# Patient Record
Sex: Male | Born: 1990 | Race: White | Hispanic: No | Marital: Single | Smoking: Never smoker
Health system: Southern US, Community
[De-identification: ages and names within clinical notes are randomized; demographics above are authoritative.]

## PROBLEM LIST (undated history)

## (undated) DIAGNOSIS — H9325 Central auditory processing disorder: Secondary | ICD-10-CM

## (undated) DIAGNOSIS — F909 Attention-deficit hyperactivity disorder, unspecified type: Secondary | ICD-10-CM

## (undated) DIAGNOSIS — R6250 Unspecified lack of expected normal physiological development in childhood: Secondary | ICD-10-CM

## (undated) DIAGNOSIS — G47419 Narcolepsy without cataplexy: Secondary | ICD-10-CM

## (undated) DIAGNOSIS — K0889 Other specified disorders of teeth and supporting structures: Secondary | ICD-10-CM

## (undated) DIAGNOSIS — T8859XA Other complications of anesthesia, initial encounter: Secondary | ICD-10-CM

## (undated) DIAGNOSIS — J45909 Unspecified asthma, uncomplicated: Secondary | ICD-10-CM

## (undated) DIAGNOSIS — J45990 Exercise induced bronchospasm: Secondary | ICD-10-CM

## (undated) DIAGNOSIS — T4145XA Adverse effect of unspecified anesthetic, initial encounter: Secondary | ICD-10-CM

## (undated) DIAGNOSIS — F988 Other specified behavioral and emotional disorders with onset usually occurring in childhood and adolescence: Secondary | ICD-10-CM

## (undated) DIAGNOSIS — F930 Separation anxiety disorder of childhood: Secondary | ICD-10-CM

## (undated) DIAGNOSIS — Q860 Fetal alcohol syndrome (dysmorphic): Secondary | ICD-10-CM

## (undated) HISTORY — DX: Fetal alcohol syndrome (dysmorphic): Q86.0

## (undated) HISTORY — DX: Exercise induced bronchospasm: J45.990

## (undated) HISTORY — DX: Attention-deficit hyperactivity disorder, unspecified type: F90.9

## (undated) HISTORY — PX: PYLOROMYOTOMY: SHX5274

---

## 2013-03-16 DIAGNOSIS — M545 Low back pain: Secondary | ICD-10-CM | POA: Diagnosis not present

## 2013-03-16 DIAGNOSIS — R635 Abnormal weight gain: Secondary | ICD-10-CM | POA: Diagnosis not present

## 2013-03-16 DIAGNOSIS — R5381 Other malaise: Secondary | ICD-10-CM | POA: Diagnosis not present

## 2013-03-16 DIAGNOSIS — R209 Unspecified disturbances of skin sensation: Secondary | ICD-10-CM | POA: Diagnosis not present

## 2013-03-16 DIAGNOSIS — M2140 Flat foot [pes planus] (acquired), unspecified foot: Secondary | ICD-10-CM | POA: Diagnosis not present

## 2013-03-16 DIAGNOSIS — R5383 Other fatigue: Secondary | ICD-10-CM | POA: Diagnosis not present

## 2013-03-27 DIAGNOSIS — S93609A Unspecified sprain of unspecified foot, initial encounter: Secondary | ICD-10-CM | POA: Diagnosis not present

## 2013-03-27 DIAGNOSIS — S93409A Sprain of unspecified ligament of unspecified ankle, initial encounter: Secondary | ICD-10-CM | POA: Diagnosis not present

## 2013-03-30 DIAGNOSIS — G44209 Tension-type headache, unspecified, not intractable: Secondary | ICD-10-CM | POA: Diagnosis not present

## 2013-04-18 DIAGNOSIS — M545 Low back pain: Secondary | ICD-10-CM | POA: Diagnosis not present

## 2013-10-20 DIAGNOSIS — R5381 Other malaise: Secondary | ICD-10-CM | POA: Diagnosis not present

## 2013-10-20 DIAGNOSIS — R5383 Other fatigue: Secondary | ICD-10-CM | POA: Diagnosis not present

## 2013-10-20 DIAGNOSIS — R634 Abnormal weight loss: Secondary | ICD-10-CM | POA: Diagnosis not present

## 2013-10-20 DIAGNOSIS — R030 Elevated blood-pressure reading, without diagnosis of hypertension: Secondary | ICD-10-CM | POA: Diagnosis not present

## 2013-11-21 ENCOUNTER — Other Ambulatory Visit: Payer: Self-pay | Admitting: Family Medicine

## 2013-11-21 DIAGNOSIS — IMO0001 Reserved for inherently not codable concepts without codable children: Secondary | ICD-10-CM

## 2013-11-21 DIAGNOSIS — R030 Elevated blood-pressure reading, without diagnosis of hypertension: Principal | ICD-10-CM

## 2013-12-01 ENCOUNTER — Ambulatory Visit
Admission: RE | Admit: 2013-12-01 | Discharge: 2013-12-01 | Disposition: A | Discharge status: Home / Self Care | Payer: Medicare Other | Source: Ambulatory Visit | Attending: Family Medicine | Admitting: Family Medicine

## 2013-12-01 DIAGNOSIS — IMO0001 Reserved for inherently not codable concepts without codable children: Secondary | ICD-10-CM

## 2013-12-01 DIAGNOSIS — N2889 Other specified disorders of kidney and ureter: Secondary | ICD-10-CM | POA: Diagnosis not present

## 2013-12-01 DIAGNOSIS — R030 Elevated blood-pressure reading, without diagnosis of hypertension: Principal | ICD-10-CM

## 2013-12-27 ENCOUNTER — Encounter: Payer: Self-pay | Admitting: *Deleted

## 2013-12-28 ENCOUNTER — Encounter: Payer: Self-pay | Admitting: *Deleted

## 2013-12-28 ENCOUNTER — Ambulatory Visit (INDEPENDENT_AMBULATORY_CARE_PROVIDER_SITE_OTHER): Payer: Medicare Other | Admitting: Cardiovascular Disease

## 2013-12-28 ENCOUNTER — Encounter: Payer: Self-pay | Admitting: Cardiovascular Disease

## 2013-12-28 VITALS — BP 100/60 | HR 66 | Ht 65.0 in | Wt 214.0 lb

## 2013-12-28 DIAGNOSIS — R5381 Other malaise: Secondary | ICD-10-CM

## 2013-12-28 DIAGNOSIS — R0989 Other specified symptoms and signs involving the circulatory and respiratory systems: Secondary | ICD-10-CM

## 2013-12-28 DIAGNOSIS — R5383 Other fatigue: Secondary | ICD-10-CM | POA: Diagnosis not present

## 2013-12-28 DIAGNOSIS — R030 Elevated blood-pressure reading, without diagnosis of hypertension: Secondary | ICD-10-CM | POA: Diagnosis not present

## 2013-12-28 NOTE — Progress Notes (Signed)
Patient ID: Paul [name], male   DOB: Mar 01, 1991, 23 y.o.   MRN: [id number]  23 yo referred by Dr Despina Hick although seems as though mother insisted on visit.  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  Spends most of his day attending to house chores and watching t.v.  He has no complaints.  Mother notes multiple somatic issues.  He's lazy.  He fatigues.  His face turns red.  His BP is high at home.  ? Of fetal alcohol syndrome.  She wants to make sure he does not have a hole in his heart.  Originally from [place].  Had mild asthma in [place] but not in [place]  No chest pain palpitations or synocpe Has weakness in legs.  Does use elliptical and bike at home 30 minutes /day without issue.      ROS: Denies fever, malais, weight loss, blurry vision, decreased visual acuity, cough, sputum, SOB, hemoptysis, pleuritic pain, palpitaitons, heartburn, abdominal pain, melena, lower extremity edema, claudication, or rash.  All other systems reviewed and negative   General: Affect slow and developmentally delayed  Healthy:  appears stated age 23: normal Acne  Neck supple with no adenopathy JVP normal no bruits no thyromegaly Lungs clear with no wheezing and good diaphragmatic motion Heart:  S1/S2 no murmur,rub, gallop or click PMI normal Abdomen: benighn, BS positve, no tenderness, no AAA no bruit.  No HSM or HJR Distal pulses intact with no bruits No edema Neuro non-focal Skin warm and dry No muscular weakness  Medications Current Outpatient Prescriptions  Medication Sig Dispense Refill  . albuterol (PROVENTIL HFA;VENTOLIN HFA) 108 (90 BASE) MCG/ACT inhaler Inhale into the lungs every 6 (six) hours as needed for wheezing or shortness of breath.      . cholecalciferol (VITAMIN D) 1000 UNITS tablet Take 2,000 Units by mouth daily.       No current facility-administered medications for this visit.    Allergies Review of patient's allergies indicates no known allergies.  Family  History: Family History  Problem Relation Age of Onset  . Adopted: Yes  . Family history unknown: Yes    Social History: History   Social History  . Marital Status: Single    Spouse Name: N/A    Number of Children: N/A  . Years of Education: N/A   Occupational History  . Not on file.   Social History Main Topics  . Smoking status: Never Smoker   . Smokeless tobacco: Not on file  . Alcohol Use: No  . Drug Use: No  . Sexual Activity: Not on file   Other Topics Concern  . Not on file   Social History Narrative  . No narrative on file    Electrocardiogram:  SR rate 66 RAD Normal   Assessment and Plan

## 2013-12-28 NOTE — Assessment & Plan Note (Signed)
Tried to reassure mother that the somatic issues she notes have nothing to do with heart.  Given developmental delay and fatigue will order echo to r/o congenital disease  Normal exam and ECG suggests no major issues

## 2013-12-28 NOTE — Patient Instructions (Signed)
Your physician recommends that you continue on your current medications as directed. Please refer to the Current Medication list given to you today.  Your physician has requested that you have an echocardiogram. Echocardiography is a painless test that uses sound waves to create images of your heart. It provides your doctor with information about the size and shape of your heart and how well your heart's chambers and valves are working. This procedure takes approximately one hour. There are no restrictions for this procedure.  Follow-up with Dr. Johnsie Cancel as needed

## 2014-01-10 ENCOUNTER — Ambulatory Visit (HOSPITAL_COMMUNITY): Payer: Medicare Other | Attending: Cardiovascular Disease | Admitting: Radiology

## 2014-01-10 DIAGNOSIS — R5381 Other malaise: Secondary | ICD-10-CM | POA: Insufficient documentation

## 2014-01-10 DIAGNOSIS — R42 Dizziness and giddiness: Secondary | ICD-10-CM | POA: Insufficient documentation

## 2014-01-10 DIAGNOSIS — R5383 Other fatigue: Secondary | ICD-10-CM | POA: Diagnosis not present

## 2014-01-10 DIAGNOSIS — R0602 Shortness of breath: Secondary | ICD-10-CM | POA: Insufficient documentation

## 2014-01-10 DIAGNOSIS — I10 Essential (primary) hypertension: Secondary | ICD-10-CM | POA: Diagnosis not present

## 2014-01-10 DIAGNOSIS — IMO0002 Reserved for concepts with insufficient information to code with codable children: Secondary | ICD-10-CM | POA: Insufficient documentation

## 2014-01-10 DIAGNOSIS — R0989 Other specified symptoms and signs involving the circulatory and respiratory systems: Secondary | ICD-10-CM

## 2014-01-10 NOTE — Progress Notes (Signed)
Echocardiogram performed.  

## 2014-07-29 IMAGING — US US RENAL
1 series · 14 of 25 positions shown · non-contrast
Comparison: None.

CLINICAL DATA: Hypertension.

EXAM:
RENAL/URINARY TRACT ULTRASOUND COMPLETE

[Series 1: us renal · 0.30mm/px · 14 of 27 slices shown]
[im 1/27]
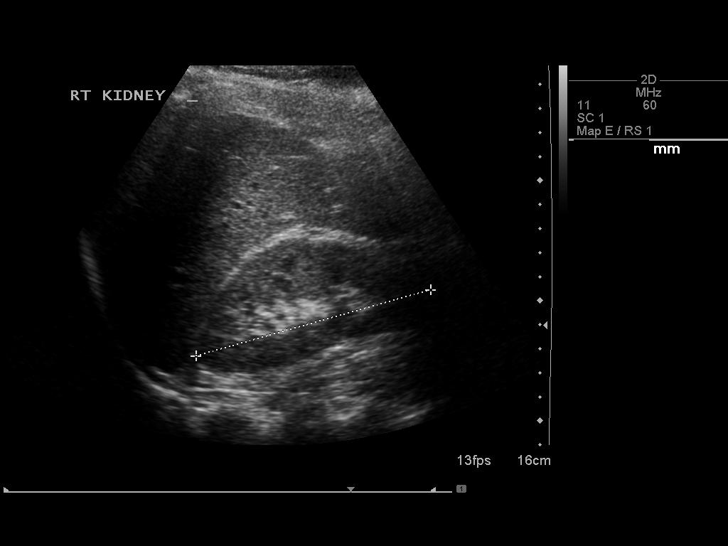
[im 3/27]
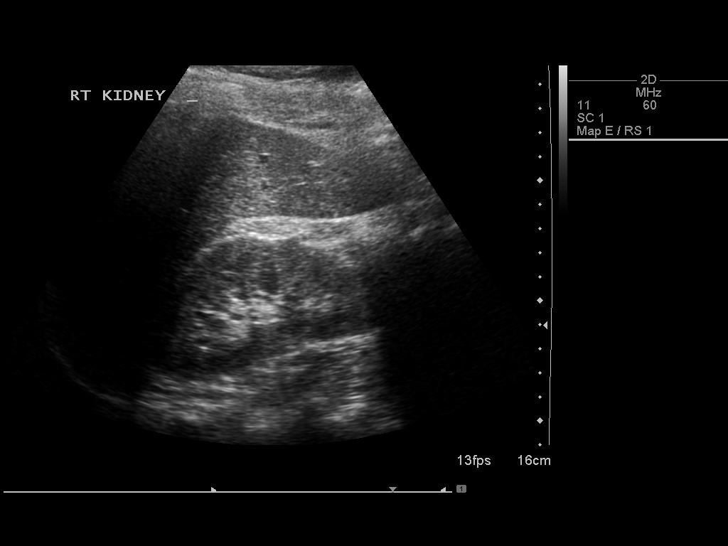
[im 5/27]
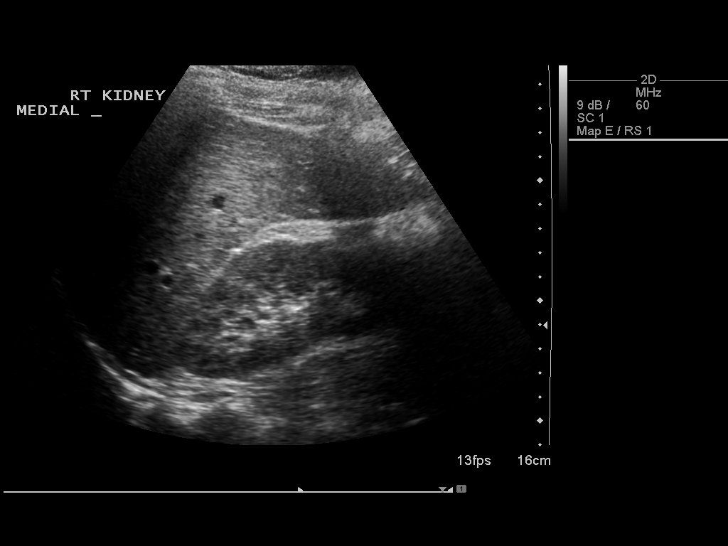
[im 7/27]
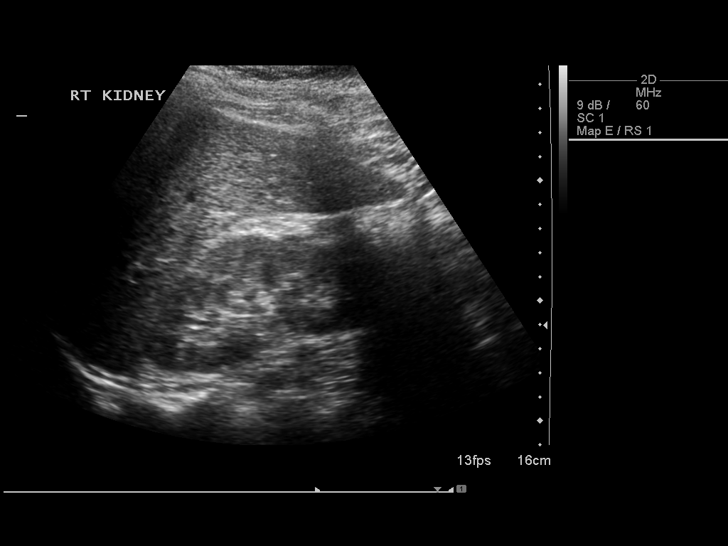
[im 9/27]
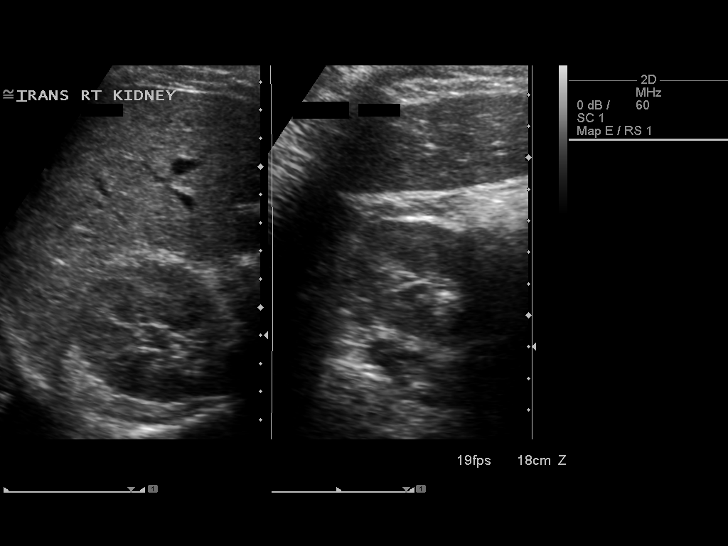
[im 10/27]
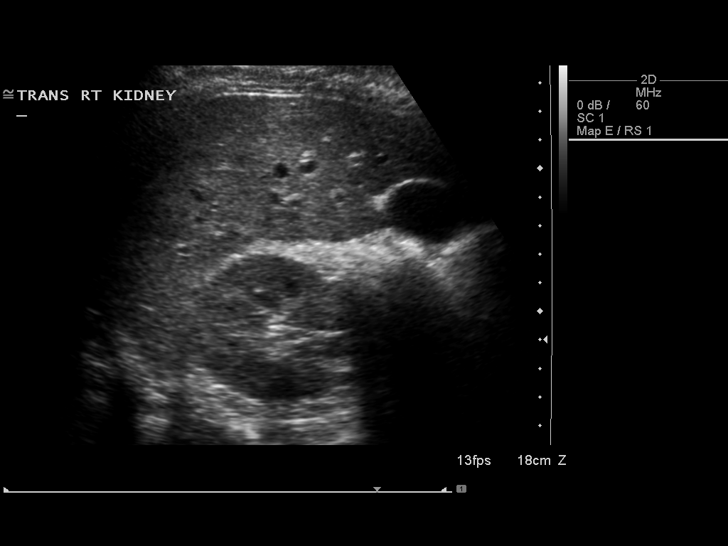
[im 12/27]
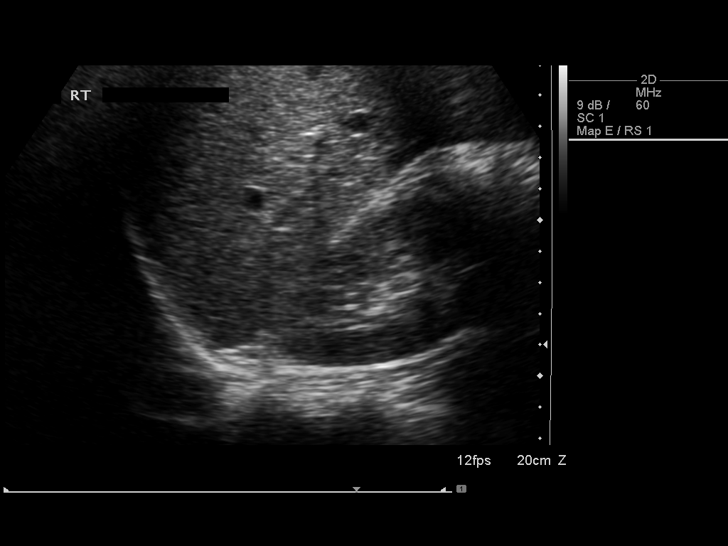
[im 15/27]
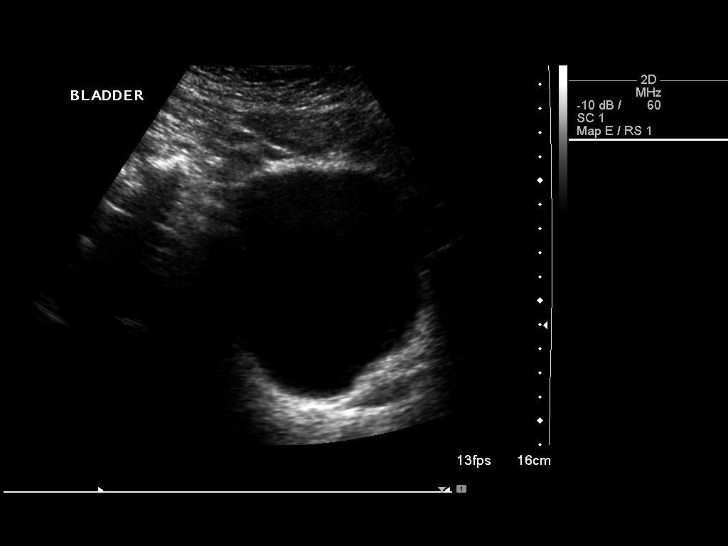
[im 17/27]
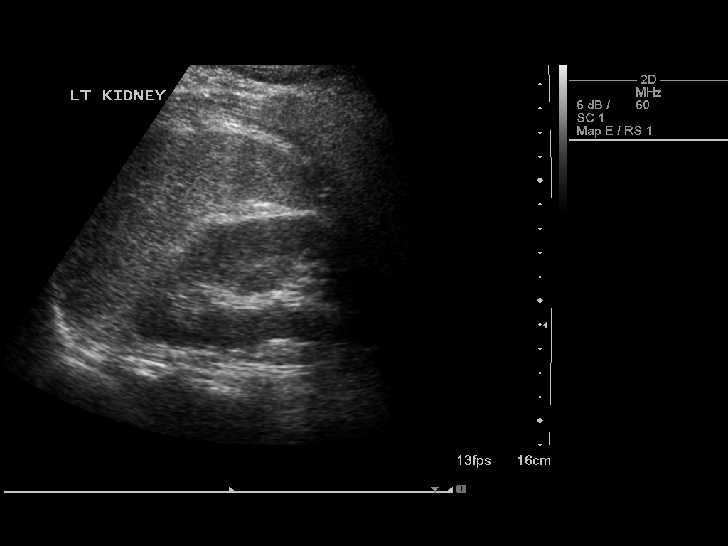
[im 18/27]
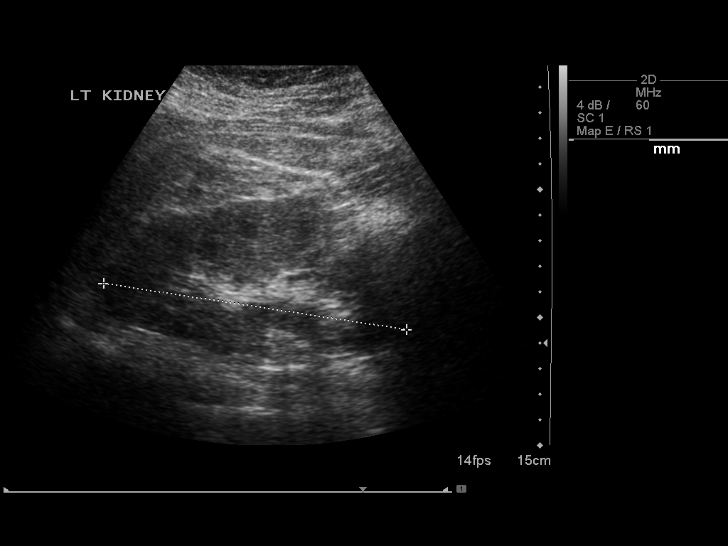
[im 20/27]
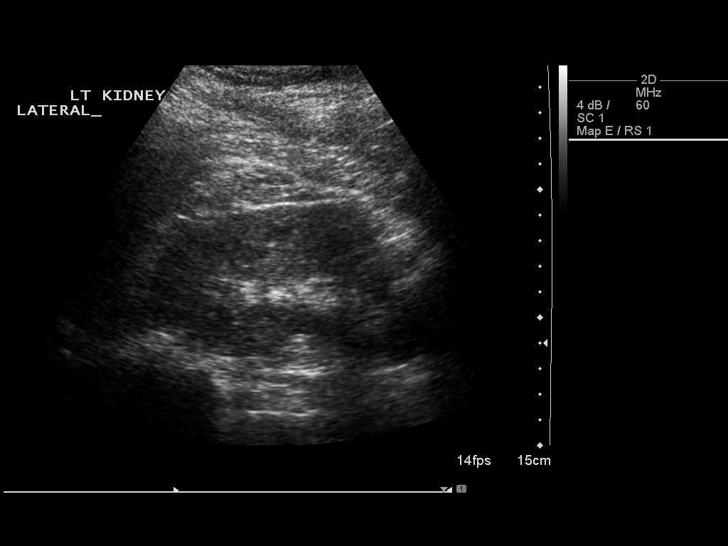
[im 22/27]
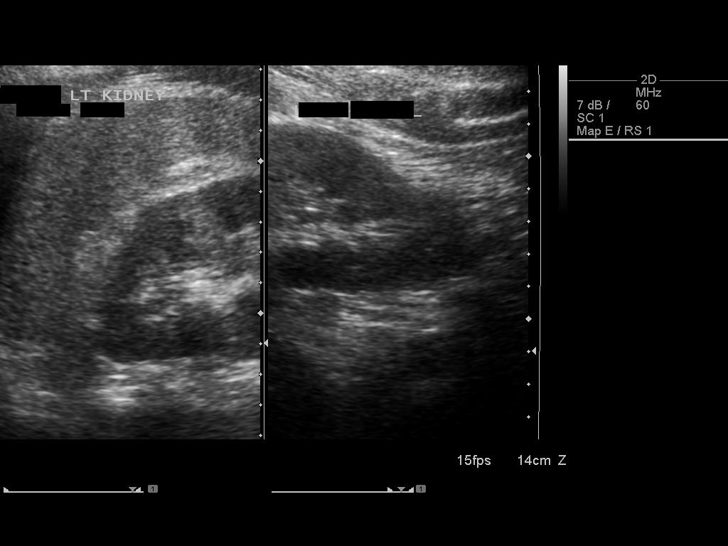
[im 24/27]
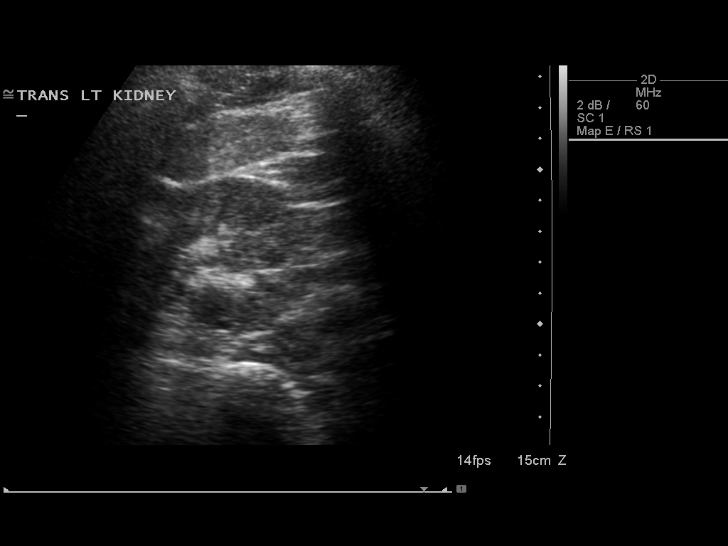
[im 27/27]
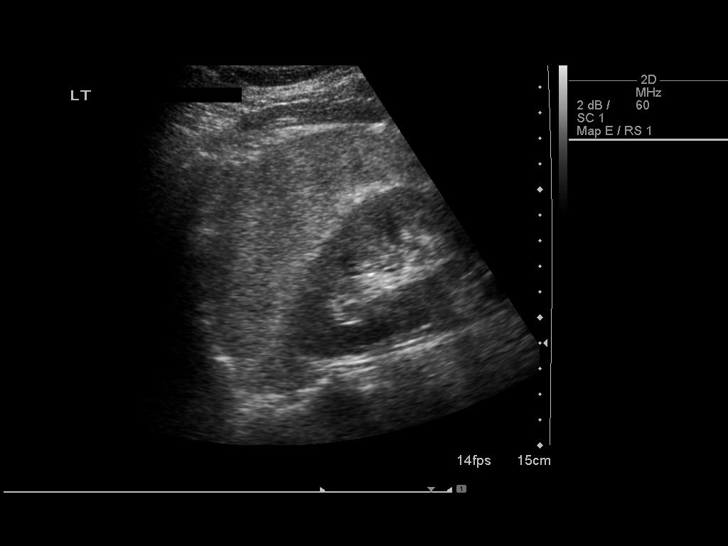

[14 of 25 positions shown; findings below may reference images not displayed]

FINDINGS: Right Kidney:

Length: 10.2 cm. Echogenicity within normal limits. No mass or
hydronephrosis visualized.

Left Kidney:

Length: 12.0 cm. Echogenicity within normal limits. No mass or
hydronephrosis visualized.

Bladder:

Appears normal for degree of bladder distention.
IMPRESSION: Normal urinary tract ultrasound.

## 2015-04-25 DIAGNOSIS — Z6835 Body mass index (BMI) 35.0-35.9, adult: Secondary | ICD-10-CM | POA: Diagnosis not present

## 2015-04-25 DIAGNOSIS — Z0000 Encounter for general adult medical examination without abnormal findings: Secondary | ICD-10-CM | POA: Diagnosis not present

## 2015-04-25 DIAGNOSIS — Z23 Encounter for immunization: Secondary | ICD-10-CM | POA: Diagnosis not present

## 2015-04-25 DIAGNOSIS — E669 Obesity, unspecified: Secondary | ICD-10-CM | POA: Diagnosis not present

## 2015-04-25 DIAGNOSIS — Z713 Dietary counseling and surveillance: Secondary | ICD-10-CM | POA: Diagnosis not present

## 2015-04-25 DIAGNOSIS — E559 Vitamin D deficiency, unspecified: Secondary | ICD-10-CM | POA: Diagnosis not present

## 2015-06-18 DIAGNOSIS — J9801 Acute bronchospasm: Secondary | ICD-10-CM | POA: Diagnosis not present

## 2015-06-18 DIAGNOSIS — Q860 Fetal alcohol syndrome (dysmorphic): Secondary | ICD-10-CM | POA: Diagnosis not present

## 2015-06-18 DIAGNOSIS — F941 Reactive attachment disorder of childhood: Secondary | ICD-10-CM | POA: Diagnosis not present

## 2015-06-18 DIAGNOSIS — J069 Acute upper respiratory infection, unspecified: Secondary | ICD-10-CM | POA: Diagnosis not present

## 2015-08-09 DIAGNOSIS — K047 Periapical abscess without sinus: Secondary | ICD-10-CM | POA: Diagnosis not present

## 2015-09-08 DIAGNOSIS — K0889 Other specified disorders of teeth and supporting structures: Secondary | ICD-10-CM

## 2015-09-08 HISTORY — DX: Other specified disorders of teeth and supporting structures: K08.89

## 2015-09-10 ENCOUNTER — Encounter (HOSPITAL_COMMUNITY): Payer: Self-pay | Admitting: Vascular Surgery

## 2015-09-10 ENCOUNTER — Encounter (HOSPITAL_COMMUNITY): Payer: Self-pay

## 2015-09-10 ENCOUNTER — Encounter (HOSPITAL_COMMUNITY)
Admission: RE | Admit: 2015-09-10 | Discharge: 2015-09-10 | Disposition: A | Discharge status: Home / Self Care | Payer: Medicare Other | Source: Ambulatory Visit | Attending: Oral Surgery | Admitting: Oral Surgery

## 2015-09-10 DIAGNOSIS — Z01818 Encounter for other preprocedural examination: Secondary | ICD-10-CM | POA: Insufficient documentation

## 2015-09-10 DIAGNOSIS — Z79899 Other long term (current) drug therapy: Secondary | ICD-10-CM | POA: Insufficient documentation

## 2015-09-10 DIAGNOSIS — K089 Disorder of teeth and supporting structures, unspecified: Secondary | ICD-10-CM | POA: Insufficient documentation

## 2015-09-10 DIAGNOSIS — J45909 Unspecified asthma, uncomplicated: Secondary | ICD-10-CM | POA: Diagnosis not present

## 2015-09-10 DIAGNOSIS — G47419 Narcolepsy without cataplexy: Secondary | ICD-10-CM | POA: Diagnosis not present

## 2015-09-10 DIAGNOSIS — Z01812 Encounter for preprocedural laboratory examination: Secondary | ICD-10-CM | POA: Diagnosis not present

## 2015-09-10 HISTORY — DX: Narcolepsy without cataplexy: G47.419

## 2015-09-10 HISTORY — DX: Unspecified asthma, uncomplicated: J45.909

## 2015-09-10 LAB — CBC
HCT: 41.8 % (ref 39.0–52.0)
HEMOGLOBIN: 14.9 g/dL (ref 13.0–17.0)
MCH: 31.8 pg (ref 26.0–34.0)
MCHC: 35.6 g/dL (ref 30.0–36.0)
MCV: 89.1 fL (ref 78.0–100.0)
PLATELETS: 263 10*3/uL (ref 150–400)
RBC: 4.69 MIL/uL (ref 4.22–5.81)
RDW: 12.3 % (ref 11.5–15.5)
WBC: 5.8 10*3/uL (ref 4.0–10.5)

## 2015-09-10 NOTE — Pre-Procedure Instructions (Addendum)
    Paul [name]  09/10/2015      [id number] - [name], [place] [address] Phone: [phone] Fax: [phone]   Your procedure is scheduled on Friday, September 13, 2015  Report to [name] Admitting at 5:30 A.M.  Call this number if you have problems the morning of surgery:  [phone]   Remember:  Do not eat food or drink liquids after midnight Thursday, September 12, 2015  Take these medicines the morning of surgery with A SIP OF WATER :None If needed: VENTOLIN inhaler ( Bring inhaler in with you on day of procedure).  Stop taking Aspirin, vitamins, fish oil, and herbal medications. Do not take any NSAIDs ie: Ibuprofen, Advil, Naproxen or any medication containing Aspirin ; stop now.   Do not wear jewelry, make-up or nail polish.  Do not wear lotions, powders, or perfumes.  You may wear deodorant.  Do not shave 48 hours prior to surgery.  Men may shave face and neck.  Do not bring valuables to the hospital.  [facility] is not responsible for any belongings or valuables.  Contacts, dentures or bridgework may not be worn into surgery.  Leave your suitcase in the car.  After surgery it may be brought to your room.  For patients admitted to the hospital, discharge time will be determined by your treatment team.  Patients discharged the day of surgery will not be allowed to drive home.   Name and phone number of your driver:   Special instructions: Shower the night before surgery and the morning of surgery with CHG.  Please read over the following fact sheets that you were given. Pain Booklet, Coughing and Deep Breathing and Surgical Site Infection Prevention

## 2015-09-10 NOTE — Progress Notes (Signed)
Pt did not show for scheduled 1:00P.M. PAT appointment; lvm on pt cell phone to return the call.

## 2015-09-10 NOTE — H&P (Signed)
HISTORY AND PHYSICAL  Paul [name] is a 25 y.o. male patient with CC: painful teeth  No diagnosis found.  No past medical history on file.  No current facility-administered medications for this encounter.   Current Outpatient Prescriptions  Medication Sig Dispense Refill  . clindamycin (CLEOCIN) 300 MG capsule Take 300 mg by mouth 4 (four) times daily.    . VENTOLIN HFA 108 (90 BASE) MCG/ACT inhaler Take 1 puff by mouth every 6 (six) hours as needed.     Allergies  Allergen Reactions  . Other     Fetal alcohol syndrome  Certain medications have adverse affect for him. Must have heavy anesthesia meds in order to work.   Active Problems:   * No active hospital problems. *  Vitals: There were no vitals taken for this visit. Lab results:No results found for this or any previous visit (from the past 34 hour(s)). Radiology Results: No results found. General appearance: alert, cooperative and no distress Head: Normocephalic, without obvious abnormality, atraumatic Eyes: negative Nose: Nares normal. Septum midline. Mucosa normal. No drainage or sinus tenderness. Throat: Dental careis teeth # 2, 3, 12, 13, 14, 18, 19, 20, 31; pharynx clear Neck: no adenopathy, supple, symmetrical, trachea midline and thyroid not enlarged, symmetric, no tenderness/mass/nodules Resp: clear to auscultation bilaterally Cardio: regular rate and rhythm, S1, S2 normal, no murmur, click, rub or gallop  Assessment:Multiple nonrestorable teeth   Plan:Multiple dental extractions. General anstheisa. Day surgery.   Wagner Tanzi M 09/10/2015

## 2015-09-11 NOTE — Progress Notes (Signed)
Anesthesia Chart Review: Patient is a 25 year old male scheduled for multiple teeth extractions on 09/13/15 by Dr. Hoyt Koch.  History includes narcolepsy, asthma (exercise induced and pollen related), non-smoker, pyloromyotomy. 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 No history of murmur reported (no murmur documented on Dr. Lupita Leash physical exam).   Meds include clindamycin (to be completed by surgery date), albuterol.  Preoperative CBC noted.   Further evaluation by his anesthesiologist on the day of surgery to discuss the definitive anesthesia plan.   George Hugh [facility] Short Stay Center/Anesthesiology Phone [phone] 09/11/2015 2:12 PM

## 2015-09-12 ENCOUNTER — Other Ambulatory Visit (HOSPITAL_COMMUNITY): Payer: Medicare Other

## 2015-09-12 MED ORDER — CEFAZOLIN SODIUM-DEXTROSE 2-3 GM-% IV SOLR
2.0000 g | INTRAVENOUS | Status: DC
Start: 2015-09-13 — End: 2015-09-12

## 2015-09-13 ENCOUNTER — Encounter (HOSPITAL_COMMUNITY): Admission: RE | Payer: Self-pay | Source: Ambulatory Visit

## 2015-09-13 ENCOUNTER — Ambulatory Visit (HOSPITAL_COMMUNITY): Admission: RE | Admit: 2015-09-13 | Payer: Medicare Other | Source: Ambulatory Visit | Admitting: Oral Surgery

## 2015-09-13 SURGERY — DENTAL RESTORATION/EXTRACTIONS
Anesthesia: General | Site: Mouth | Laterality: Bilateral

## 2015-09-19 ENCOUNTER — Encounter (HOSPITAL_BASED_OUTPATIENT_CLINIC_OR_DEPARTMENT_OTHER): Payer: Self-pay | Admitting: *Deleted

## 2015-09-19 NOTE — Progress Notes (Signed)
   09/19/15 0957  OBSTRUCTIVE SLEEP APNEA  Have you ever been diagnosed with sleep apnea through a sleep study? No  Do you snore loudly (loud enough to be heard through closed doors)?  1  Do you often feel tired, fatigued, or sleepy during the daytime (such as falling asleep during driving or talking to someone)? 1  Has anyone observed you stop breathing during your sleep? 1  Do you have, or are you being treated for high blood pressure? 0  BMI more than 35 kg/m2? 0  Age > 47 (1-yes) 0  Male Gender (Yes=1) 1  Obstructive Sleep Apnea Score 4  Score 5 or greater  Results sent to PCP Lester Kinsman)

## 2015-09-19 NOTE — Pre-Procedure Instructions (Signed)
Pt's size and history discussed with Dr. Tamala Winning; pt. OK to come for surgery.

## 2015-09-20 ENCOUNTER — Encounter (HOSPITAL_BASED_OUTPATIENT_CLINIC_OR_DEPARTMENT_OTHER): Payer: Self-pay | Admitting: *Deleted

## 2015-09-23 ENCOUNTER — Ambulatory Visit (HOSPITAL_BASED_OUTPATIENT_CLINIC_OR_DEPARTMENT_OTHER): Payer: Medicare Other | Admitting: Anesthesiology

## 2015-09-23 ENCOUNTER — Encounter (HOSPITAL_BASED_OUTPATIENT_CLINIC_OR_DEPARTMENT_OTHER)
Admission: RE | Disposition: A | Discharge status: Home / Self Care | Payer: Self-pay | Source: Ambulatory Visit | Attending: Oral Surgery

## 2015-09-23 ENCOUNTER — Encounter (HOSPITAL_BASED_OUTPATIENT_CLINIC_OR_DEPARTMENT_OTHER): Payer: Self-pay

## 2015-09-23 ENCOUNTER — Ambulatory Visit (HOSPITAL_BASED_OUTPATIENT_CLINIC_OR_DEPARTMENT_OTHER)
Admission: RE | Admit: 2015-09-23 | Discharge: 2015-09-23 | Disposition: A | Discharge status: Home / Self Care | Payer: Medicare Other | Source: Ambulatory Visit | Attending: Oral Surgery | Admitting: Oral Surgery

## 2015-09-23 DIAGNOSIS — K0889 Other specified disorders of teeth and supporting structures: Secondary | ICD-10-CM | POA: Diagnosis not present

## 2015-09-23 DIAGNOSIS — F909 Attention-deficit hyperactivity disorder, unspecified type: Secondary | ICD-10-CM | POA: Diagnosis not present

## 2015-09-23 DIAGNOSIS — K029 Dental caries, unspecified: Secondary | ICD-10-CM | POA: Diagnosis not present

## 2015-09-23 HISTORY — DX: Unspecified lack of expected normal physiological development in childhood: R62.50

## 2015-09-23 HISTORY — DX: Separation anxiety disorder of childhood: F93.0

## 2015-09-23 HISTORY — DX: Central auditory processing disorder: H93.25

## 2015-09-23 HISTORY — DX: Adverse effect of unspecified anesthetic, initial encounter: T41.45XA

## 2015-09-23 HISTORY — DX: Other complications of anesthesia, initial encounter: T88.59XA

## 2015-09-23 HISTORY — PX: MULTIPLE EXTRACTIONS WITH ALVEOLOPLASTY: SHX5342

## 2015-09-23 HISTORY — DX: Other specified disorders of teeth and supporting structures: K08.89

## 2015-09-23 SURGERY — MULTIPLE EXTRACTION WITH ALVEOLOPLASTY
Anesthesia: General | Site: Mouth

## 2015-09-23 MED ORDER — OXYCODONE-ACETAMINOPHEN 5-325 MG PO TABS: 1.0000 | ORAL_TABLET | ORAL | Status: DC | PRN

## 2015-09-23 MED ORDER — SCOPOLAMINE 1 MG/3DAYS TD PT72: 1.0000 | MEDICATED_PATCH | Freq: Once | TRANSDERMAL | Status: DC

## 2015-09-23 MED ORDER — PROPOFOL 500 MG/50ML IV EMUL
INTRAVENOUS | Status: AC
  Filled 2015-09-23: qty 50

## 2015-09-23 MED ORDER — SUCCINYLCHOLINE CHLORIDE 20 MG/ML IJ SOLN
INTRAMUSCULAR | Status: DC | PRN
  Administered 2015-09-23: 100 mg via INTRAVENOUS

## 2015-09-23 MED ORDER — GLYCOPYRROLATE 0.2 MG/ML IJ SOLN: 0.2000 mg | Freq: Once | INTRAMUSCULAR | Status: DC | PRN

## 2015-09-23 MED ORDER — LIDOCAINE HCL (CARDIAC) 20 MG/ML IV SOLN
INTRAVENOUS | Status: AC
Start: 2015-09-23 — End: 2015-09-23
  Filled 2015-09-23: qty 5

## 2015-09-23 MED ORDER — ONDANSETRON HCL 4 MG/2ML IJ SOLN
INTRAMUSCULAR | Status: DC | PRN
  Administered 2015-09-23: 4 mg via INTRAVENOUS

## 2015-09-23 MED ORDER — LIDOCAINE HCL (CARDIAC) 20 MG/ML IV SOLN
INTRAVENOUS | Status: DC | PRN
  Administered 2015-09-23: 100 mg via INTRAVENOUS

## 2015-09-23 MED ORDER — ONDANSETRON HCL 4 MG/2ML IJ SOLN
INTRAMUSCULAR | Status: AC
  Filled 2015-09-23: qty 2

## 2015-09-23 MED ORDER — DEXAMETHASONE SODIUM PHOSPHATE 4 MG/ML IJ SOLN
INTRAMUSCULAR | Status: DC | PRN
  Administered 2015-09-23: 10 mg via INTRAVENOUS

## 2015-09-23 MED ORDER — FENTANYL CITRATE (PF) 100 MCG/2ML IJ SOLN
INTRAMUSCULAR | Status: AC
  Filled 2015-09-23: qty 2

## 2015-09-23 MED ORDER — ATROPINE SULFATE 0.4 MG/ML IJ SOLN
INTRAMUSCULAR | Status: AC
  Filled 2015-09-23: qty 1

## 2015-09-23 MED ORDER — MIDAZOLAM HCL 2 MG/2ML IJ SOLN
0.5000 mg | INTRAMUSCULAR | Status: DC | PRN
  Administered 2015-09-23: 2 mg via INTRAVENOUS

## 2015-09-23 MED ORDER — DEXAMETHASONE SODIUM PHOSPHATE 10 MG/ML IJ SOLN
INTRAMUSCULAR | Status: AC
  Filled 2015-09-23: qty 1

## 2015-09-23 MED ORDER — SUCCINYLCHOLINE CHLORIDE 20 MG/ML IJ SOLN
INTRAMUSCULAR | Status: AC
  Filled 2015-09-23: qty 1

## 2015-09-23 MED ORDER — DEXMEDETOMIDINE HCL 200 MCG/2ML IV SOLN
INTRAVENOUS | Status: DC | PRN
  Administered 2015-09-23 (×3): 20 ug via INTRAVENOUS

## 2015-09-23 MED ORDER — LIDOCAINE-EPINEPHRINE 2 %-1:100000 IJ SOLN
INTRAMUSCULAR | Status: DC | PRN
  Administered 2015-09-23: 16 mL via INTRADERMAL

## 2015-09-23 MED ORDER — MIDAZOLAM HCL 2 MG/2ML IJ SOLN
INTRAMUSCULAR | Status: AC
  Filled 2015-09-23: qty 2

## 2015-09-23 MED ORDER — PROPOFOL 10 MG/ML IV BOLUS
INTRAVENOUS | Status: DC | PRN
  Administered 2015-09-23: 250 mg via INTRAVENOUS

## 2015-09-23 MED ORDER — FENTANYL CITRATE (PF) 100 MCG/2ML IJ SOLN
INTRAMUSCULAR | Status: DC | PRN
  Administered 2015-09-23: 100 ug via INTRAVENOUS

## 2015-09-23 MED ORDER — LACTATED RINGERS IV SOLN
INTRAVENOUS | Status: DC
  Administered 2015-09-23 (×2): via INTRAVENOUS

## 2015-09-23 SURGICAL SUPPLY — 48 items
BLADE DERMATOME SS (BLADE) IMPLANT
BLADE SURG 15 STRL LF DISP TIS (BLADE) ×1 IMPLANT
BLADE SURG 15 STRL SS (BLADE) ×2
BUR CROSS CUT FISSURE 1.6 (BURR) ×2 IMPLANT
BUR CROSS CUT FISSURE 1.6MM (BURR) ×1
BUR EGG ELITE 4.0 (BURR) ×2 IMPLANT
BUR EGG ELITE 4.0MM (BURR) ×1
CANISTER SUCT 1200ML W/VALVE (MISCELLANEOUS) ×3 IMPLANT
CATH ROBINSON RED A/P 10FR (CATHETERS) IMPLANT
CLOSURE WOUND 1/2 X4 (GAUZE/BANDAGES/DRESSINGS)
COVER BACK TABLE 60X90IN (DRAPES) ×3 IMPLANT
COVER MAYO STAND STRL (DRAPES) ×3 IMPLANT
DECANTER SPIKE VIAL GLASS SM (MISCELLANEOUS) IMPLANT
DEPRESSOR TONGUE BLADE STERILE (MISCELLANEOUS) IMPLANT
DRAPE U-SHAPE 76X120 STRL (DRAPES) ×3 IMPLANT
DRSG TEGADERM 4X10 (GAUZE/BANDAGES/DRESSINGS) IMPLANT
GAUZE PACKING FOLDED 2  STR (GAUZE/BANDAGES/DRESSINGS) ×2
GAUZE PACKING FOLDED 2 STR (GAUZE/BANDAGES/DRESSINGS) ×1 IMPLANT
GAUZE PACKING IODOFORM 1/4X15 (GAUZE/BANDAGES/DRESSINGS) IMPLANT
GLOVE BIO SURGEON STRL SZ 6.5 (GLOVE) ×2 IMPLANT
GLOVE BIO SURGEON STRL SZ7.5 (GLOVE) ×3 IMPLANT
GLOVE BIO SURGEONS STRL SZ 6.5 (GLOVE) ×1
GLOVE BIOGEL PI IND STRL 6.5 (GLOVE) ×2 IMPLANT
GLOVE BIOGEL PI INDICATOR 6.5 (GLOVE) ×4
GOWN STRL REUS W/ TWL LRG LVL3 (GOWN DISPOSABLE) ×1 IMPLANT
GOWN STRL REUS W/ TWL XL LVL3 (GOWN DISPOSABLE) ×1 IMPLANT
GOWN STRL REUS W/TWL LRG LVL3 (GOWN DISPOSABLE) ×2
GOWN STRL REUS W/TWL XL LVL3 (GOWN DISPOSABLE) ×2
IV NS 500ML (IV SOLUTION) ×2
IV NS 500ML BAXH (IV SOLUTION) ×1 IMPLANT
NEEDLE HYPO 22GX1.5 SAFETY (NEEDLE) ×6 IMPLANT
NS IRRIG 1000ML POUR BTL (IV SOLUTION) ×3 IMPLANT
PACK BASIN DAY SURGERY FS (CUSTOM PROCEDURE TRAY) ×3 IMPLANT
SLEEVE SCD COMPRESS KNEE MED (MISCELLANEOUS) IMPLANT
SPONGE SURGIFOAM ABS GEL 12-7 (HEMOSTASIS) IMPLANT
STRIP CLOSURE SKIN 1/2X4 (GAUZE/BANDAGES/DRESSINGS) IMPLANT
SUT CHROMIC 3 0 PS 2 (SUTURE) ×3 IMPLANT
SYR 20CC LL (SYRINGE) IMPLANT
SYR BULB 3OZ (MISCELLANEOUS) ×3 IMPLANT
SYR CONTROL 10ML LL (SYRINGE) ×6 IMPLANT
TOOTHBRUSH ADULT (PERSONAL CARE ITEMS) IMPLANT
TOWEL OR 17X24 6PK STRL BLUE (TOWEL DISPOSABLE) ×3 IMPLANT
TOWEL OR NON WOVEN STRL DISP B (DISPOSABLE) ×3 IMPLANT
TRAY DSU PREP LF (CUSTOM PROCEDURE TRAY) IMPLANT
TUBE CONNECTING 20'X1/4 (TUBING) ×1
TUBE CONNECTING 20X1/4 (TUBING) ×2 IMPLANT
TUBING IRRIGATION (MISCELLANEOUS) ×3 IMPLANT
YANKAUER SUCT BULB TIP NO VENT (SUCTIONS) ×3 IMPLANT

## 2015-09-23 NOTE — Anesthesia Preprocedure Evaluation (Signed)
Anesthesia Evaluation  Patient identified by MRN, date of birth, ID band Patient awake    Reviewed: Allergy & Precautions, NPO status , Patient's Chart, lab work & pertinent test results  Airway Mallampati: II   Neck ROM: full    Dental   Pulmonary asthma ,    breath sounds clear to auscultation       Cardiovascular negative cardio ROS   Rhythm:regular Rate:Normal     Neuro/Psych ADHD   GI/Hepatic   Endo/Other  obese  Renal/GU      Musculoskeletal   Abdominal   Peds Fetal alcohol syndrome   Hematology   Anesthesia Other Findings   Reproductive/Obstetrics                             Anesthesia Physical Anesthesia Plan  ASA: II  Anesthesia Plan: General   Post-op Pain Management:    Induction: Intravenous  Airway Management Planned: Nasal ETT  Additional Equipment:   Intra-op Plan:   Post-operative Plan: Extubation in OR  Informed Consent: I have reviewed the patients History and Physical, chart, labs and discussed the procedure including the risks, benefits and alternatives for the proposed anesthesia with the patient or authorized representative who has indicated his/her understanding and acceptance.     Plan Discussed with: CRNA, Anesthesiologist and Surgeon  Anesthesia Plan Comments:         Anesthesia Quick Evaluation

## 2015-09-23 NOTE — Anesthesia Procedure Notes (Signed)
Procedure Name: Intubation Date/Time: 09/23/2015 7:31 AM Performed by: Lyndee Leo Pre-anesthesia Checklist: Patient identified, Emergency Drugs available, Suction available and Patient being monitored Patient Re-evaluated:Patient Re-evaluated prior to inductionOxygen Delivery Method: Circle System Utilized Preoxygenation: Pre-oxygenation with 100% oxygen Intubation Type: IV induction Ventilation: Mask ventilation without difficulty Laryngoscope Size: Miller and 3 Grade View: Grade II Nasal Tubes: Nasal prep performed, Nasal Rae, Left and Magill forceps- large, utilized Tube size: 7.0 mm Number of attempts: 1 Placement Confirmation: ETT inserted through vocal cords under direct vision,  positive ETCO2 and breath sounds checked- equal and bilateral Tube secured with: Tape Dental Injury: Teeth and Oropharynx as per pre-operative assessment

## 2015-09-23 NOTE — Discharge Instructions (Signed)

## 2015-09-23 NOTE — Transfer of Care (Signed)
Immediate Anesthesia Transfer of Care Note  Patient: Paul [name]  Procedure(s) Performed: Procedure(s): MULTIPLE EXTRACTION WITH ALVEOLOPLASTY (N/A)  Patient Location: PACU  Anesthesia Type:General  Level of Consciousness: sedated and responds to stimulation  Airway & Oxygen Therapy: Patient Spontanous Breathing and Patient connected to face mask oxygen  Post-op Assessment: Report given to RN and Post -op Vital signs reviewed and stable  Post vital signs: Reviewed and stable  Last Vitals:  Filed Vitals:   09/23/15 0627 09/23/15 0821  BP: 138/66   Pulse: 61 77  Temp: 36.7 C   Resp: 20     Complications: No apparent anesthesia complications

## 2015-09-23 NOTE — Anesthesia Postprocedure Evaluation (Signed)
Anesthesia Post Note  Patient: Paul [name]  Procedure(s) Performed: Procedure(s) (LRB): MULTIPLE EXTRACTION WITH ALVEOLOPLASTY (N/A)  Patient location during evaluation: PACU Anesthesia Type: General Level of consciousness: awake and alert and patient cooperative Pain management: pain level controlled Vital Signs Assessment: post-procedure vital signs reviewed and stable Respiratory status: spontaneous breathing and respiratory function stable Cardiovascular status: stable Anesthetic complications: no    Last Vitals:  Filed Vitals:   09/23/15 0845 09/23/15 0853  BP: 101/56   Pulse: 77 76  Temp:    Resp: 14 14    Last Pain: There were no vitals filed for this visit.               Emlyn

## 2015-09-23 NOTE — Op Note (Signed)
NAMEMIRIAM, BIONDO               ACCOUNT NO.:  [id number]  MEDICAL RECORD NO.:  [id number]  LOCATION:                                 FACILITY:  PHYSICIAN:  Gae Bon, M.D.  DATE OF BIRTH:  [birth year]  DATE OF PROCEDURE:  09/23/2015 DATE OF DISCHARGE:                              OPERATIVE REPORT   PREOPERATIVE DIAGNOSIS:  Nonrestorable teeth numbers 2, 3, 12, 13, 15, 18, 19, 20, 31.  POSTOPERATIVE DIAGNOSIS:  Nonrestorable teeth numbers 2, 3, 12, 13, 15, 18, 19, 20, 31.  PROCEDURE:  Multiple extractions, teeth numbers 2, 3, 12, 13, 15, 18, 19, 20, 31.  Alveoplasty in the left mandible and right mandible.  SURGEON:  Gae Bon, M.D.  ANESTHESIA:  General, nasal intubation.  ESTIMATED BLOOD LOSS:  Minimal.  COMPLICATIONS:  None.  SPECIMENS:  None.  PROCEDURE IN DETAIL:  The patient was taken to the operating room, placed on the table in a supine position.  General anesthesia was administered intravenously, and a nasal endotracheal tube was placed and secured.  The eyes were protected, and then the patient was draped for the procedure.  Time-out was performed.  The posterior pharynx was suctioned, and a throat pack was placed.  A 2% lidocaine with 1:100,000 epinephrine was infiltrated in inferior alveolar block on the right and left side and a buccal and palatal infiltration in the right and left maxilla, total of 16 mL of solution was utilized.  A bite block was placed in the right side of the mouth, and a sweetheart retractor was used to retract the tongue.  A #15 blade was used to make an incision around teeth numbers 18, 19, 20 on the buccal and lingual aspects in the gingival sulcus and around teeth numbers 12, 13, and 15.  The periosteum was reflected from around these teeth.  The Stryker handpiece was used to remove interproximal bone from around these teeth and then the teeth were elevated, and removal was attempted with the forceps.  All of  these teeth fractured requiring additional bone removal and sectioning.  The teeth were eventually removed using the 301 elevator with hemostats. The sockets were then curetted.  Alveoplasty was performed in the left mandible using the egg-shaped burr and bone file; and then, the left side was sutured with 3-0 chromic.  The bite block and sweetheart retractor were repositioned to the other side of the mouth; and then, a 15 blade was used to make an incision around teeth numbers 2, 3, and 31. Periosteum was reflected, and the teeth were elevated.  An attempt was made to remove the teeth with the dental forceps.  However, these teeth fractured due to extensive amalgam failing amalgam restorations; and then, the teeth were sectioned with a Stryker handpiece under irrigation and removed.  Tooth number 31 required extension of an initial incision because of the need for access to complete bony margins around this tooth.  When the teeth were eventually extracted, the sockets were curetted, alveoplasty was performed in the right mandible as the remaining bone was irregular in contour and would not allow for prosthetic restoration.  The areas were then irrigated and closed with  3-0 chromic. Then, the oral cavity was irrigated and suctioned.  The throat pack was removed.  The patient was awakened, taken to recovery room, breathing spontaneously in good condition.     Gae Bon, M.D.     SMJ/MEDQ  D:  09/23/2015  T:  09/23/2015  Job:  [id number]

## 2015-09-23 NOTE — H&P (Signed)
HISTORY AND PHYSICAL  Paul [name] is a 25 y.o. male patient with CC: referred by general dentist for extraction nonrestorable teeth.  No diagnosis found.  Past Medical History  Diagnosis Date  . Narcolepsy   . Asthma     excercise induced and pollen  . Fetal alcohol syndrome   . Development delay     states mental status of a 25 year old  . Exercise-induced asthma     prn inhaler/neb.  . Twin birth   . ADHD (attention deficit hyperactivity disorder)   . Auditory processing disorder   . Separation anxiety   . Non-restorable tooth 09/2015    teeth  . Complication of anesthesia     mother states pt. is harder to put under anes. and hard to wake up due to fetal alcohol syndrome; can be combative, per mother; states he will do better if mother is in PACU when he is coming out of anes.    Current Facility-Administered Medications  Medication Dose Route Frequency Provider Last Rate Last Dose  . glycopyrrolate (ROBINUL) injection 0.2 mg  0.2 mg Intravenous Once PRN Roderic Palau, MD      . lactated ringers infusion   Intravenous Continuous Roderic Palau, MD 10 mL/hr at 09/23/15 0710    . midazolam (VERSED) injection 0.5-2 mg  0.5-2 mg Intravenous Q5 min PRN Roderic Palau, MD      . scopolamine (TRANSDERM-SCOP) 1 MG/3DAYS 1.5 mg  1 patch Transdermal Once Roderic Palau, MD       Allergies  Allergen Reactions  . Other     Fetal alcohol syndrome  Certain medications have adverse affect for him. Must have heavy anesthesia meds in order to work.   Active Problems:   * No active hospital problems. *  Vitals: Blood pressure 138/66, pulse 61, temperature 98.1 F (36.7 C), temperature source Oral, resp. rate 20, height 5\' 10"  (1.778 m), weight 103.239 kg (227 lb 9.6 oz), SpO2 99 %. Lab results:No results found for this or any previous visit (from the past 77 hour(s)). Radiology Results: No results found. General appearance: alert and cooperative Head:  Normocephalic, without obvious abnormality, atraumatic Eyes: negative Nose: Nares normal. Septum midline. Mucosa normal. No drainage or sinus tenderness. Throat: multiple dental caries, pharynx clear, no trismus Neck: no adenopathy, no carotid bruit, no JVD, supple, symmetrical, trachea midline and thyroid not enlarged, symmetric, no tenderness/mass/nodules Resp: clear to auscultation bilaterally Cardio: regular rate and rhythm, S1, S2 normal, no murmur, click, rub or gallop  Assessment: Multiple nonrestorable teeth secondary to dental caries  Plan: Multiple dental extractions. General anesthesia. Day surgery.   Gae Bon 09/23/2015

## 2015-09-23 NOTE — Op Note (Signed)
09/23/2015  8:07 AM  PATIENT:  Paul [name]  25 y.o. male  PRE-OPERATIVE DIAGNOSIS:  NON RESTORABLE TEETH #2, 3, 12, 13, 15, 18, 19, 20, 31  POST-OPERATIVE DIAGNOSIS:  SAME  PROCEDURE:  Procedure(s): MULTIPLE EXTRACTION TEETH#2, 3, 12, 13, 15, 18, 19, 20, 31 WITH ALVEOLOPLASTY  SURGEON:  Surgeon(s): Diona Browner, DDS  ANESTHESIA:   local and general  EBL:  minimal  DRAINS: none   SPECIMEN:  No Specimen  COUNTS:  YES  PLAN OF CARE: Discharge to home after PACU  PATIENT DISPOSITION:  PACU - hemodynamically stable.   PROCEDURE DETAILS: Dictation # [id number]  Gae Bon, DMD 09/23/2015 8:07 AM

## 2015-09-24 ENCOUNTER — Encounter (HOSPITAL_BASED_OUTPATIENT_CLINIC_OR_DEPARTMENT_OTHER): Payer: Self-pay | Admitting: Oral Surgery

## 2015-11-06 ENCOUNTER — Ambulatory Visit (HOSPITAL_BASED_OUTPATIENT_CLINIC_OR_DEPARTMENT_OTHER): Payer: Medicare Other

## 2015-11-16 ENCOUNTER — Ambulatory Visit (HOSPITAL_BASED_OUTPATIENT_CLINIC_OR_DEPARTMENT_OTHER): Payer: Medicare Other | Attending: Family Medicine

## 2015-11-27 ENCOUNTER — Other Ambulatory Visit: Payer: Self-pay | Admitting: Physician Assistant

## 2015-11-27 ENCOUNTER — Ambulatory Visit
Admission: RE | Admit: 2015-11-27 | Discharge: 2015-11-27 | Disposition: A | Discharge status: Home / Self Care | Payer: Medicare Other | Source: Ambulatory Visit | Attending: Physician Assistant | Admitting: Physician Assistant

## 2015-11-27 DIAGNOSIS — M79671 Pain in right foot: Secondary | ICD-10-CM | POA: Diagnosis not present

## 2015-11-27 DIAGNOSIS — S92344A Nondisplaced fracture of fourth metatarsal bone, right foot, initial encounter for closed fracture: Secondary | ICD-10-CM | POA: Diagnosis not present

## 2015-12-26 DIAGNOSIS — M79671 Pain in right foot: Secondary | ICD-10-CM | POA: Diagnosis not present

## 2016-01-21 ENCOUNTER — Ambulatory Visit (HOSPITAL_BASED_OUTPATIENT_CLINIC_OR_DEPARTMENT_OTHER): Payer: Medicare Other | Attending: Family Medicine | Admitting: Internal Medicine

## 2016-01-21 VITALS — Ht 65.0 in | Wt 220.0 lb

## 2016-01-21 DIAGNOSIS — R0683 Snoring: Secondary | ICD-10-CM | POA: Diagnosis not present

## 2016-01-21 DIAGNOSIS — G4730 Sleep apnea, unspecified: Secondary | ICD-10-CM

## 2016-01-21 DIAGNOSIS — R5383 Other fatigue: Secondary | ICD-10-CM

## 2016-01-26 DIAGNOSIS — G4730 Sleep apnea, unspecified: Secondary | ICD-10-CM

## 2016-01-26 DIAGNOSIS — R5383 Other fatigue: Secondary | ICD-10-CM

## 2016-01-26 DIAGNOSIS — R0683 Snoring: Secondary | ICD-10-CM | POA: Diagnosis not present

## 2016-01-26 NOTE — Procedures (Signed)
  Patient Name: Paul [name], Paul [name] Date: 01/21/2016 Gender: Male D.O.B: 1991-05-27 Age (years): 24 Referring Provider: Maury Dus Height (inches): 48 Interpreting Physician: Baird Lyons MD, ABSM Weight (lbs): 220 RPSGT: Laren Everts BMI: 37 MRN: [id number] Neck Size: 17.50 CLINICAL INFORMATION Sleep Study Type: NPSG Indication for sleep study: Excessive Daytime Sleepiness, Fatigue, Obesity, Re-Evaluation, Snoring Epworth Sleepiness Score: 17  SLEEP STUDY TECHNIQUE As per the AASM Manual for the Scoring of Sleep and Associated Events v2.3 (April 2016) with a hypopnea requiring 4% desaturations. The channels recorded and monitored were frontal, central and occipital EEG, electrooculogram (EOG), submentalis EMG (chin), nasal and oral airflow, thoracic and abdominal wall motion, anterior tibialis EMG, snore microphone, electrocardiogram, and pulse oximetry.  MEDICATIONS Patient's medications include: charted for review. Medications self-administered by patient during sleep study : No sleep medicine administered.  SLEEP ARCHITECTURE The study was initiated at 10:00:45 PM and ended at 4:58:36 AM. Sleep onset time was 0.2 minutes and the sleep efficiency was 96.1%. The total sleep time was 401.5 minutes. Stage REM latency was 58.0 minutes. The patient spent 3.61% of the night in stage N1 sleep, 65.75% in stage N2 sleep, 8.34% in stage N3 and 22.29% in REM. Alpha intrusion was absent. Supine sleep was 77.23%.  RESPIRATORY PARAMETERS The overall apnea/hypopnea index (AHI) was 2.7 per hour. There were 2 total apneas, including 0 obstructive, 2 central and 0 mixed apneas. There were 16 hypopneas and 16 RERAs. The AHI during Stage REM sleep was 9.4 per hour. AHI while supine was 3.3 per hour. The mean oxygen saturation was 94.63%. The minimum SpO2 during sleep was 88.00%. Moderate snoring was noted during this study.  CARDIAC DATA The 2 lead EKG demonstrated sinus rhythm.  The mean heart rate was N/A beats per minute. Other EKG findings include: PVCs.  LEG MOVEMENT DATA The total PLMS were 26 with a resulting PLMS index of 3.89. Associated arousal with leg movement index was 1.3 .  IMPRESSIONS - No significant obstructive sleep apnea occurred during this study (AHI = 2.7/h). - No significant central sleep apnea occurred during this study (CAI = 0.3/h). - The patient snored with Moderate snoring volume. - EKG findings include PVCs. - Clinically significant periodic limb movements did not occur during sleep. No significant associated arousals.  DIAGNOSIS - Primary Snoring (786.09 [R06.83 ICD-10]) - Normal study  RECOMMENDATIONS - Avoid alcohol, sedatives and other CNS depressants that may worsen sleep apnea and disrupt normal sleep architecture. - Sleep hygiene should be reviewed to assess factors that may improve sleep quality. - Weight management and regular exercise should be initiated or continued if appropriate.  Deneise Lever Diplomate, American Board of Sleep Medicine  ELECTRONICALLY SIGNED ON:  01/26/2016, 1:25 PM [place] PH: [phone]   FX: [phone] [place]

## 2018-01-03 DIAGNOSIS — Z0000 Encounter for general adult medical examination without abnormal findings: Secondary | ICD-10-CM | POA: Diagnosis not present

## 2018-01-03 DIAGNOSIS — E6609 Other obesity due to excess calories: Secondary | ICD-10-CM | POA: Diagnosis not present

## 2018-01-03 DIAGNOSIS — E559 Vitamin D deficiency, unspecified: Secondary | ICD-10-CM | POA: Diagnosis not present

## 2018-01-03 DIAGNOSIS — Z6837 Body mass index (BMI) 37.0-37.9, adult: Secondary | ICD-10-CM | POA: Diagnosis not present

## 2019-06-14 DIAGNOSIS — J4521 Mild intermittent asthma with (acute) exacerbation: Secondary | ICD-10-CM | POA: Diagnosis not present

## 2019-11-29 DIAGNOSIS — E559 Vitamin D deficiency, unspecified: Secondary | ICD-10-CM | POA: Diagnosis not present

## 2019-11-29 DIAGNOSIS — L309 Dermatitis, unspecified: Secondary | ICD-10-CM | POA: Diagnosis not present

## 2019-11-29 DIAGNOSIS — R413 Other amnesia: Secondary | ICD-10-CM | POA: Diagnosis not present

## 2019-12-26 DIAGNOSIS — R5383 Other fatigue: Secondary | ICD-10-CM | POA: Diagnosis not present

## 2019-12-26 DIAGNOSIS — R413 Other amnesia: Secondary | ICD-10-CM | POA: Diagnosis not present

## 2019-12-26 DIAGNOSIS — Z6839 Body mass index (BMI) 39.0-39.9, adult: Secondary | ICD-10-CM | POA: Diagnosis not present

## 2019-12-26 DIAGNOSIS — Q860 Fetal alcohol syndrome (dysmorphic): Secondary | ICD-10-CM | POA: Diagnosis not present

## 2019-12-26 DIAGNOSIS — E669 Obesity, unspecified: Secondary | ICD-10-CM | POA: Diagnosis not present

## 2019-12-26 DIAGNOSIS — E559 Vitamin D deficiency, unspecified: Secondary | ICD-10-CM | POA: Diagnosis not present

## 2019-12-26 DIAGNOSIS — J3089 Other allergic rhinitis: Secondary | ICD-10-CM | POA: Diagnosis not present

## 2019-12-26 DIAGNOSIS — L309 Dermatitis, unspecified: Secondary | ICD-10-CM | POA: Diagnosis not present

## 2019-12-26 DIAGNOSIS — J45990 Exercise induced bronchospasm: Secondary | ICD-10-CM | POA: Diagnosis not present

## 2019-12-26 DIAGNOSIS — R0683 Snoring: Secondary | ICD-10-CM | POA: Diagnosis not present

## 2020-01-01 DIAGNOSIS — R748 Abnormal levels of other serum enzymes: Secondary | ICD-10-CM | POA: Diagnosis not present

## 2020-01-01 DIAGNOSIS — R413 Other amnesia: Secondary | ICD-10-CM | POA: Diagnosis not present

## 2020-01-01 DIAGNOSIS — E559 Vitamin D deficiency, unspecified: Secondary | ICD-10-CM | POA: Diagnosis not present

## 2020-01-01 DIAGNOSIS — R5383 Other fatigue: Secondary | ICD-10-CM | POA: Diagnosis not present

## 2020-01-01 DIAGNOSIS — Z6839 Body mass index (BMI) 39.0-39.9, adult: Secondary | ICD-10-CM | POA: Diagnosis not present

## 2020-01-01 DIAGNOSIS — E669 Obesity, unspecified: Secondary | ICD-10-CM | POA: Diagnosis not present

## 2020-01-03 ENCOUNTER — Other Ambulatory Visit: Payer: Self-pay

## 2020-01-03 ENCOUNTER — Telehealth: Payer: Self-pay | Admitting: Neurology

## 2020-01-03 ENCOUNTER — Encounter: Payer: Self-pay | Admitting: Neurology

## 2020-01-03 ENCOUNTER — Ambulatory Visit (INDEPENDENT_AMBULATORY_CARE_PROVIDER_SITE_OTHER): Payer: Medicare Other | Admitting: Neurology

## 2020-01-03 VITALS — BP 122/80 | HR 93 | Temp 97.3°F | Ht 68.0 in | Wt 241.0 lb

## 2020-01-03 DIAGNOSIS — Q860 Fetal alcohol syndrome (dysmorphic): Secondary | ICD-10-CM

## 2020-01-03 DIAGNOSIS — R4189 Other symptoms and signs involving cognitive functions and awareness: Secondary | ICD-10-CM | POA: Diagnosis not present

## 2020-01-03 DIAGNOSIS — R6889 Other general symptoms and signs: Secondary | ICD-10-CM | POA: Diagnosis not present

## 2020-01-03 DIAGNOSIS — E669 Obesity, unspecified: Secondary | ICD-10-CM | POA: Diagnosis not present

## 2020-01-03 DIAGNOSIS — G4719 Other hypersomnia: Secondary | ICD-10-CM | POA: Diagnosis not present

## 2020-01-03 DIAGNOSIS — R413 Other amnesia: Secondary | ICD-10-CM

## 2020-01-03 DIAGNOSIS — R0683 Snoring: Secondary | ICD-10-CM

## 2020-01-03 DIAGNOSIS — R400 Somnolence: Secondary | ICD-10-CM

## 2020-01-03 NOTE — H&P (View-Only) (Signed)
Subjective:    [name] ID: Paul [name] is a 29 y.o. male.  HPI     Paul Age, MD, PhD [facility] [address], [address] [place], [place] [address]  Dear Dr. Rayann [name],   I saw your [name], Paul [name], upon your kind request in my neurologic clinic today for initial consultation of his memory loss and sleepiness.  Paul [name] is accompanied by his mother today.   As you know, Paul [name] is a 29 year old right-handed gentleman with an underlying medical history of allergies, fetal alcohol syndrome, with developmental delays, ADHD, vitamin D deficiency, asthma, and obesity, who has had cognitive decline over Paul past 18 months per mom.  Paul [name] is minimally verbal and not able to provide any of his history.  She adopted him when he was 7.  He has a twin brother who had an abnormality on his brain.  Both Paul [name] and his twin brother had MRIs many years ago, MRI results of any brain MRI or CT had are not available for my review today.  He has had increase in frustration, some mood irritability, no aggression.  He has been noted to become slower in his movements and less vigilant in Paul past 1 year.  Sometimes he has a staring spell or zones out, no convulsions.  She is not sure if he had an EEG in Paul past but would like to get an EEG done.  She believes that he would not be able to lay still for an MRI and would like to have it done under anesthesia but also reports that he had complications from anesthesia including apneas when he had dental surgery under anesthesia and was sent home, and stopped breathing at home.  She did not have to go to Paul ER with him as he started breathing on his own. He has had sleepiness during Paul day and his Epworth sleepiness score is high at 22 out of 24, fatigue severity score is 63 out of 63. He snores. I reviewed your office note from 12/26/2019.  He had a sleep study about 4 years ago which was negative for sleep apnea  at Paul time.  I reviewed blood test results which were available through your records: TSH was normal, free T4 normal, B12 385, lipid panel showed LDL of 81, total cholesterol 122, triglycerides 78, HDL 30, vitamin D was low at 12, CMP showed BUN of 11, creatinine of 1.8, alkaline phosphatase elevated at 153, liver enzymes otherwise normal, CBC with differential was unremarkable. He is able to do his own ADLs but takes longer.  It takes him 4 hours to do Paul dishes.  He plays computer games but seems to have become less quick with games that he has been able to do well before.  His Past Medical History Is Significant For: Past Medical History:  Diagnosis Date  . ADHD (attention deficit hyperactivity disorder)   . Asthma    excercise induced and pollen  . Auditory processing disorder   . Complication of anesthesia    mother states pt. is harder to put under anes. and hard to wake up due to fetal alcohol syndrome; can be combative, per mother; states he will do better if mother is in PACU when he is coming out of anes.  . Development delay    states mental status of a 29 year old  . Exercise-induced asthma    prn inhaler/neb.  . Fetal alcohol syndrome   . Narcolepsy   .  Non-restorable tooth 09/2015   teeth  . Separation anxiety   . Twin birth     His Past Surgical History Is Significant For: Past Surgical History:  Procedure Laterality Date  . MULTIPLE EXTRACTIONS WITH ALVEOLOPLASTY N/A 09/23/2015   Procedure: MULTIPLE EXTRACTION WITH ALVEOLOPLASTY;  Surgeon: Paul [name], DDS;  Location: [facility];  Service: Oral Surgery;  Laterality: N/A;  . PYLOROMYOTOMY     [name] three, performed found to not have stenosis on surgical exam  . PYLOROMYOTOMY     did not have pyloric stenosis; did surgery on Paul wrong twin    His Family History Is Significant For: Family History  Adopted: Yes  Problem Relation [name] of Onset  . Mental illness Mother   . Diabetes Mother   .  Hypertension Mother   . Schizophrenia Mother   . Bipolar disorder Mother   . Mental retardation Father     His Social History Is Significant For: Social History   Socioeconomic History  . Marital status: Single    Spouse name: Not on file  . Number of children: Not on file  . Years of education: Not on file  . Highest education level: Not on file  Occupational History  . Not on file  Tobacco Use  . Smoking status: Never Smoker  . Smokeless tobacco: Never Used  Substance and Sexual Activity  . Alcohol use: No  . Drug use: No  . Sexual activity: Not on file  Other Topics Concern  . Not on file  Social History Narrative   ** Merged History Encounter **       Social Determinants of Health   Financial Resource Strain:   . Difficulty of Paying Living Expenses:   Food Insecurity:   . Worried About Charity fundraiser in Paul Last Year:   . Arboriculturist in [place] Last Year:   Transportation Needs:   . Film/video editor (Medical):   Marland Kitchen Lack of Transportation (Non-Medical):   Physical Activity:   . Days of Exercise per Week:   . Minutes of Exercise per Session:   Stress:   . Feeling of Stress :   Social Connections:   . Frequency of Communication with Friends and Family:   . Frequency of Social Gatherings with Friends and Family:   . Attends Religious Services:   . Active Member of Clubs or Organizations:   . Attends Archivist Meetings:   Marland Kitchen Marital Status:     His Allergies Are:  Allergies  Allergen Reactions  . Other     Fetal alcohol syndrome  Certain medications have adverse affect for him. Must have heavy anesthesia meds in order to work.  :   His Current Medications Are:  Outpatient Encounter Medications as of 01/03/2020  Medication Sig  . albuterol (PROVENTIL HFA;VENTOLIN HFA) 108 (90 BASE) MCG/ACT inhaler Inhale into Paul lungs every 6 (six) hours as needed for wheezing or shortness of breath.  Marland Kitchen albuterol (PROVENTIL) (5 MG/ML) 0.5%  nebulizer solution Take 2.5 mg by nebulization every 6 (six) hours as needed for wheezing or shortness of breath.  . loratadine (CLARITIN) 10 MG tablet Take 10 mg by mouth daily.   No facility-administered encounter medications on file as of 01/03/2020.  :   Review of Systems:  Out of a complete 14 point review of systems, all are reviewed and negative with Paul exception of these symptoms as listed below:  Review of Systems  Neurological:  Pt is here for evaluation on memory loss//snoring. Pt's mom sts pt moves a lot during his sleep and will fall asleep during Paul day easily. She reports Paul pt has has a change in short term memory over Paul last 1.5. she wonders if this could be related to his fetal alcohol syndrome. Epworth Sleepiness Scale 0= would never doze 1= slight chance of dozing 2= moderate chance of dozing 3= high chance of dozing  Sitting and reading:3 Watching TV:3 Sitting inactive in a public place (ex. Theater or meeting):3 As a passenger in a car for an hour without a break:3 Lying down to rest in Paul afternoon:2 Sitting and talking to someone:2 Sitting quietly after lunch (no alcohol):3 In a car, while stopped in traffic: Total:22     Objective:  Neurological Exam  Physical Exam Physical Examination:   Vitals:   01/03/20 1005  BP: 122/80  Pulse: 93  Temp: (!) 97.3 F (36.3 C)   General Examination: Paul [name] is a very pleasant 29 y.o. male in no acute distress. He appears well-developed and well-nourished and adequately groomed.   HEENT: Normocephalic, atraumatic, pupils are equal, round and reactive to light, extraocular tracking is mildly impaired, face is symmetric, speech is very scant and slightly slow but not dysarthric.  He has no hypophonia, neck is supple with full range of motion.  Airway examination reveals mild to moderate mouth dryness, tongue protrudes centrally in palate elevates symmetrically.  No carotid bruits.   Chest: Clear  to auscultation without wheezing, rhonchi or crackles noted.  Heart: S1+S2+0, regular and normal without murmurs, rubs or gallops noted.   Abdomen: Soft, non-tender and non-distended with normal bowel sounds appreciated on auscultation.  Extremities: There is no pitting edema in Paul distal lower extremities bilaterally.   Skin: Warm and dry without trophic changes noted.  Musculoskeletal: exam reveals no obvious joint deformities, tenderness or joint swelling or erythema.   Neurologically:  Mental status: Paul [name] is awake, alert and pays attention.  He is able to follow simple commands, he is minimally verbal.  He is unable to give his own history, MMSE is 21 out of 30, clock drawing shows difficulty with putting Paul numbers in Paul right spaces.  Animal fluency test shows 18/min.    Cranial nerves II - XII are as described above under HEENT exam. In addition: shoulder shrug is normal with equal shoulder height noted. Motor exam: Normal bulk, strength and tone is noted. Some slowness in his movements. There is no drift, tremor or rebound. Reflexes are 2+ throughout. Babinski: Toes are flexor bilaterally. Fine motor skills and coordination: intact with normal finger taps, normal hand movements, normal rapid alternating patting, normal foot taps and normal foot agility.  Cerebellar testing: No dysmetria or intention tremor on finger to nose testing. Heel to shin is unremarkable bilaterally. There is no truncal or gait ataxia.  Sensory exam: intact to light touch in Paul upper and lower extremities.  Gait, station and balance: He stands up slowly and walks slowly, no shuffling, preserved arm swing is noted.   Assessment and Plan:  In summary, DEJAN CARNEVALE is a very pleasant 29 y.o.-year old male with an underlying medical history of allergies, fetal alcohol syndrome, with developmental delays, ADHD, vitamin D deficiency, asthma, and obesity, who presents for evaluation of his cognitive  decline.  He has had daytime somnolence which has become worse.  We will proceed with further testing, he had significant blood work recently and I do believe  we need to add any blood work today.  We will do a brain MRI.  Mom reports that he would not be able to lay still for a MRI even with oral anxiolytics.  He would need to be under anesthesia.  She also adds that he had side effects with anesthesia in Paul past including difficulty waking up and apneas after a dental procedure which was done under anesthesia.  She is advised that we would have to see if he is a candidate for MRI under anesthesia.  I requested this study.  In addition, we will proceed with a sleep study.  Mom indicates that she would have to stay with him.  She reports that a home sleep test would be difficult as they have multiple family members in there would be noise in Paul house disturbing his sleep.  I requested an overnight laboratory attended sleep study and we will accommodate his mom to stay with him.  We may request evaluation through a memory disorders center for further evaluation as I am not familiar with fetal alcohol syndrome and he may benefit from further evaluation.  He has had some mood related changes including irritability and low frustration tolerance.  He may benefit from seeing a psychologist or psychiatrist.  She is encouraged to talk to your office about a referral if need be, currently she feels that he is stable.  He has never had any issues with aggression.  He was cooperative with Paul exam and agreeable to Paul plan to proceed with a brain MRI and sleep study.  If he has obstructive sleep apnea we will consider treatment with a CPAP or AutoPap machine.  She also reports that he has had some staring spells or zoning out spells, no convulsion, no history of seizures.  She is not sure if he had an EEG before.  I offered that we could schedule an EEG in our office.  She is agreeable.  I answered all Paul questions today and  Paul [name] and his mother were in agreement.  I plan to see him back after testing. Thank you very much for allowing me to participate in Paul care of this nice [name]. If I can be of any further assistance to you please do not hesitate to call me at [phone].  Sincerely,   Paul Age, MD, PhD

## 2020-01-03 NOTE — Patient Instructions (Addendum)
You have complaints of memory loss: memory loss or changes in cognitive function can have many reasons and does not always mean you have dementia. Conditions that can contribute to subjective or objective memory loss include: depression, stress, poor sleep from insomnia or sleep apnea, dehydration, fluctuation in blood sugar values, thyroid or electrolyte dysfunction and certain vitamin deficiencies. Dementia can be caused by stroke, brain atherosclerosis or brain vascular disease due to vascular risk factors (smoking, high blood pressure, high cholesterol, obesity and uncontrolled diabetes), certain degenerative brain disorders (including Parkinson's disease and Multiple sclerosis) and by Alzheimer's disease or other, more rare and sometimes hereditary causes. We will do some additional testing, blood work has been done recently already.   We will do a brain scan. As requested, we will do this with anesthesia.  We may request further evaluation through a memory disorder center, a specialist with more familiarity with fetal alcohol syndrome.    For your sleepiness and snoring, I will order a sleep study to rule out obstructive sleep apnea.  Based on your symptoms and your exam I believe you are at risk for obstructive sleep apnea (aka OSA), and I think we should proceed with a sleep study to determine whether you do or do not have OSA and how severe it is. Even, if you have mild OSA, I may want you to consider treatment with CPAP, as treatment of even borderline or mild sleep apnea can result and improvement of symptoms such as sleep disruption, daytime sleepiness, nighttime bathroom breaks, restless leg symptoms, improvement of headache syndromes, even improved mood disorder.   Please remember, the long-term risks and ramifications of untreated moderate to severe obstructive sleep apnea are: increased Cardiovascular disease, including congestive heart failure, stroke, difficult to control hypertension,  treatment resistant obesity, arrhythmias, especially irregular heartbeat commonly known as A. Fib. (atrial fibrillation); even type 2 diabetes has been linked to untreated OSA.

## 2020-01-03 NOTE — Telephone Encounter (Signed)
Medicare/medicaid no auth. Faxed information to [facility] they will reach out to the patient to schedule. Also, gave nurse Megan the H&P form.

## 2020-01-03 NOTE — Progress Notes (Signed)
Subjective:    Patient ID: Paul [name] is a 29 y.o. male.  HPI     Star Age, MD, PhD [facility] [address], [address] [place], [place] [address]  Dear Dr. Rayann Heman,   I saw your patient, Paul [name], upon your kind request in my neurologic clinic today for initial consultation of his memory loss and sleepiness.  The patient is accompanied by his mother today.   As you know, Paul [name] is a 29 year old right-handed gentleman with an underlying medical history of allergies, fetal alcohol syndrome, with developmental delays, ADHD, vitamin D deficiency, asthma, and obesity, who has had cognitive decline over the past 18 months per mom.  The patient is minimally verbal and not able to provide any of his history.  She adopted him when he was 7.  He has a twin brother who had an abnormality on his brain.  Both the patient and his twin brother had MRIs many years ago, MRI results of any brain MRI or CT had are not available for my review today.  He has had increase in frustration, some mood irritability, no aggression.  He has been noted to become slower in his movements and less vigilant in the past 1 year.  Sometimes he has a staring spell or zones out, no convulsions.  She is not sure if he had an EEG in the past but would like to get an EEG done.  She believes that he would not be able to lay still for an MRI and would like to have it done under anesthesia but also reports that he had complications from anesthesia including apneas when he had dental surgery under anesthesia and was sent home, and stopped breathing at home.  She did not have to go to the ER with him as he started breathing on his own. He has had sleepiness during the day and his Epworth sleepiness score is high at 22 out of 24, fatigue severity score is 63 out of 63. He snores. I reviewed your office note from 12/26/2019.  He had a sleep study about 4 years ago which was negative for sleep apnea  at the time.  I reviewed blood test results which were available through your records: TSH was normal, free T4 normal, B12 385, lipid panel showed LDL of 81, total cholesterol 122, triglycerides 78, HDL 30, vitamin D was low at 12, CMP showed BUN of 11, creatinine of 1.8, alkaline phosphatase elevated at 153, liver enzymes otherwise normal, CBC with differential was unremarkable. He is able to do his own ADLs but takes longer.  It takes him 4 hours to do the dishes.  He plays computer games but seems to have become less quick with games that he has been able to do well before.  His Past Medical History Is Significant For: Past Medical History:  Diagnosis Date  . ADHD (attention deficit hyperactivity disorder)   . Asthma    excercise induced and pollen  . Auditory processing disorder   . Complication of anesthesia    mother states pt. is harder to put under anes. and hard to wake up due to fetal alcohol syndrome; can be combative, per mother; states he will do better if mother is in PACU when he is coming out of anes.  . Development delay    states mental status of a 29 year old  . Exercise-induced asthma    prn inhaler/neb.  . Fetal alcohol syndrome   . Narcolepsy   .  Non-restorable tooth 09/2015   teeth  . Separation anxiety   . Twin birth     His Past Surgical History Is Significant For: Past Surgical History:  Procedure Laterality Date  . MULTIPLE EXTRACTIONS WITH ALVEOLOPLASTY N/A 09/23/2015   Procedure: MULTIPLE EXTRACTION WITH ALVEOLOPLASTY;  Surgeon: Diona Browner, DDS;  Location: [place];  Service: Oral Surgery;  Laterality: N/A;  . PYLOROMYOTOMY     age [age], performed found to not have stenosis on surgical exam  . PYLOROMYOTOMY     did not have pyloric stenosis; did surgery on the wrong twin    His Family History Is Significant For: Family History  Adopted: Yes  Problem Relation Age of Onset  . Mental illness Mother   . Diabetes Mother   .  Hypertension Mother   . Schizophrenia Mother   . Bipolar disorder Mother   . Mental retardation Father     His Social History Is Significant For: Social History   Socioeconomic History  . Marital status: Single    Spouse name: Not on file  . Number of children: Not on file  . Years of education: Not on file  . Highest education level: Not on file  Occupational History  . Not on file  Tobacco Use  . Smoking status: Never Smoker  . Smokeless tobacco: Never Used  Substance and Sexual Activity  . Alcohol use: No  . Drug use: No  . Sexual activity: Not on file  Other Topics Concern  . Not on file  Social History Narrative   ** Merged History Encounter **       Social Determinants of Health   Financial Resource Strain:   . Difficulty of Paying Living Expenses:   Food Insecurity:   . Worried About Charity fundraiser in the Last Year:   . Arboriculturist in the Last Year:   Transportation Needs:   . Film/video editor (Medical):   Marland Kitchen Lack of Transportation (Non-Medical):   Physical Activity:   . Days of Exercise per Week:   . Minutes of Exercise per Session:   Stress:   . Feeling of Stress :   Social Connections:   . Frequency of Communication with Friends and Family:   . Frequency of Social Gatherings with Friends and Family:   . Attends Religious Services:   . Active Member of Clubs or Organizations:   . Attends Archivist Meetings:   Marland Kitchen Marital Status:     His Allergies Are:  Allergies  Allergen Reactions  . Other     Fetal alcohol syndrome  Certain medications have adverse affect for him. Must have heavy anesthesia meds in order to work.  :   His Current Medications Are:  Outpatient Encounter Medications as of 01/03/2020  Medication Sig  . albuterol (PROVENTIL HFA;VENTOLIN HFA) 108 (90 BASE) MCG/ACT inhaler Inhale into the lungs every 6 (six) hours as needed for wheezing or shortness of breath.  Marland Kitchen albuterol (PROVENTIL) (5 MG/ML) 0.5%  nebulizer solution Take 2.5 mg by nebulization every 6 (six) hours as needed for wheezing or shortness of breath.  . loratadine (CLARITIN) 10 MG tablet Take 10 mg by mouth daily.   No facility-administered encounter medications on file as of 01/03/2020.  :   Review of Systems:  Out of a complete 14 point review of systems, all are reviewed and negative with the exception of these symptoms as listed below:  Review of Systems  Neurological:  Pt is here for evaluation on memory loss//snoring. Pt's mom sts pt moves a lot during his sleep and will fall asleep during the day easily. She reports the pt has has a change in short term memory over the last 1.5. she wonders if this could be related to his fetal alcohol syndrome. Epworth Sleepiness Scale 0= would never doze 1= slight chance of dozing 2= moderate chance of dozing 3= high chance of dozing  Sitting and reading:3 Watching TV:3 Sitting inactive in a public place (ex. Theater or meeting):3 As a passenger in a car for an hour without a break:3 Lying down to rest in the afternoon:2 Sitting and talking to someone:2 Sitting quietly after lunch (no alcohol):3 In a car, while stopped in traffic: Total:22     Objective:  Neurological Exam  Physical Exam Physical Examination:   Vitals:   01/03/20 1005  BP: 122/80  Pulse: 93  Temp: (!) 97.3 F (36.3 C)   General Examination: The patient is a very pleasant 29 y.o. male in no acute distress. He appears well-developed and well-nourished and adequately groomed.   HEENT: Normocephalic, atraumatic, pupils are equal, round and reactive to light, extraocular tracking is mildly impaired, face is symmetric, speech is very scant and slightly slow but not dysarthric.  He has no hypophonia, neck is supple with full range of motion.  Airway examination reveals mild to moderate mouth dryness, tongue protrudes centrally in palate elevates symmetrically.  No carotid bruits.   Chest: Clear  to auscultation without wheezing, rhonchi or crackles noted.  Heart: S1+S2+0, regular and normal without murmurs, rubs or gallops noted.   Abdomen: Soft, non-tender and non-distended with normal bowel sounds appreciated on auscultation.  Extremities: There is no pitting edema in the distal lower extremities bilaterally.   Skin: Warm and dry without trophic changes noted.  Musculoskeletal: exam reveals no obvious joint deformities, tenderness or joint swelling or erythema.   Neurologically:  Mental status: The patient is awake, alert and pays attention.  He is able to follow simple commands, he is minimally verbal.  He is unable to give his own history, MMSE is 21 out of 30, clock drawing shows difficulty with putting the numbers in the right spaces.  Animal fluency test shows 18/min.    Cranial nerves II - XII are as described above under HEENT exam. In addition: shoulder shrug is normal with equal shoulder height noted. Motor exam: Normal bulk, strength and tone is noted. Some slowness in his movements. There is no drift, tremor or rebound. Reflexes are 2+ throughout. Babinski: Toes are flexor bilaterally. Fine motor skills and coordination: intact with normal finger taps, normal hand movements, normal rapid alternating patting, normal foot taps and normal foot agility.  Cerebellar testing: No dysmetria or intention tremor on finger to nose testing. Heel to shin is unremarkable bilaterally. There is no truncal or gait ataxia.  Sensory exam: intact to light touch in the upper and lower extremities.  Gait, station and balance: He stands up slowly and walks slowly, no shuffling, preserved arm swing is noted.   Assessment and Plan:  In summary, Paul [name] is a very pleasant 29 y.o.-year old male with an underlying medical history of allergies, fetal alcohol syndrome, with developmental delays, ADHD, vitamin D deficiency, asthma, and obesity, who presents for evaluation of his cognitive  decline.  He has had daytime somnolence which has become worse.  We will proceed with further testing, he had significant blood work recently and I do believe  we need to add any blood work today.  We will do a brain MRI.  Mom reports that he would not be able to lay still for a MRI even with oral anxiolytics.  He would need to be under anesthesia.  She also adds that he had side effects with anesthesia in the past including difficulty waking up and apneas after a dental procedure which was done under anesthesia.  She is advised that we would have to see if he is a candidate for MRI under anesthesia.  I requested this study.  In addition, we will proceed with a sleep study.  Mom indicates that she would have to stay with him.  She reports that a home sleep test would be difficult as they have multiple family members in there would be noise in the house disturbing his sleep.  I requested an overnight laboratory attended sleep study and we will accommodate his mom to stay with him.  We may request evaluation through a memory disorders center for further evaluation as I am not familiar with fetal alcohol syndrome and he may benefit from further evaluation.  He has had some mood related changes including irritability and low frustration tolerance.  He may benefit from seeing a psychologist or psychiatrist.  She is encouraged to talk to your office about a referral if need be, currently she feels that he is stable.  He has never had any issues with aggression.  He was cooperative with the exam and agreeable to the plan to proceed with a brain MRI and sleep study.  If he has obstructive sleep apnea we will consider treatment with a CPAP or AutoPap machine.  She also reports that he has had some staring spells or zoning out spells, no convulsion, no history of seizures.  She is not sure if he had an EEG before.  I offered that we could schedule an EEG in our office.  She is agreeable.  I answered all the questions today and  the patient and his mother were in agreement.  I plan to see him back after testing. Thank you very much for allowing me to participate in the care of this nice patient. If I can be of any further assistance to you please do not hesitate to call me at [phone].  Sincerely,   Star Age, MD, PhD

## 2020-01-04 NOTE — Telephone Encounter (Signed)
Patient is scheduled at [facility] for 01/16/20.

## 2020-01-13 ENCOUNTER — Ambulatory Visit (HOSPITAL_COMMUNITY)
Admission: RE | Admit: 2020-01-13 | Discharge: 2020-01-13 | Disposition: A | Discharge status: Home / Self Care | Payer: Medicare Other | Source: Ambulatory Visit | Attending: Neurology | Admitting: Neurology

## 2020-01-13 DIAGNOSIS — Z20822 Contact with and (suspected) exposure to covid-19: Secondary | ICD-10-CM | POA: Insufficient documentation

## 2020-01-13 DIAGNOSIS — Z01812 Encounter for preprocedural laboratory examination: Secondary | ICD-10-CM | POA: Diagnosis not present

## 2020-01-13 LAB — SARS CORONAVIRUS 2 (TAT 6-24 HRS): SARS Coronavirus 2: NEGATIVE

## 2020-01-15 ENCOUNTER — Other Ambulatory Visit: Payer: Self-pay

## 2020-01-15 ENCOUNTER — Encounter (HOSPITAL_COMMUNITY): Payer: Self-pay | Admitting: *Deleted

## 2020-01-15 NOTE — Progress Notes (Signed)
Spoke with pt's mother for pre-op call. Pt has hx of fetal alcohol syndrome, ADD, Auditory processing disorder and developmental delay. Mother states he can understand most questions, if he doesn't he usually tells the person to ask it in a different way. Mother needs to be with him in pre-op. She thinks she will need to be in PACU when he is waking up also.  Covid test done 01/13/20 and it's negative. Mother states that they have been in quarantine since the test was done and understands that they stay in quarantine until they come to the hospital.   Chart sent to Anesthesia PA for review.

## 2020-01-15 NOTE — Anesthesia Preprocedure Evaluation (Addendum)
Anesthesia Evaluation  Patient identified by MRN, date of birth, ID band Patient awake    Reviewed: Allergy & Precautions, H&P , NPO status , Patient's Chart, lab work & pertinent test results  History of Anesthesia Complications (+) PROLONGED EMERGENCE, Emergence Delirium and history of anesthetic complications  Airway Mallampati: I  TM Distance: >3 FB Neck ROM: Full    Dental no notable dental hx. (+) Teeth Intact, Dental Advisory Given, Poor Dentition, Chipped, Missing,    Pulmonary neg pulmonary ROS, asthma ,    Pulmonary exam normal breath sounds clear to auscultation       Cardiovascular Exercise Tolerance: Good negative cardio ROS Normal cardiovascular exam Rhythm:Regular Rate:Normal  EKG: Last EKG seen was on 12/28/13 and showed SR with marked sinus arrhythmia.  Echo 01/10/14: Study Conclusions  - Left ventricle: The cavity size was normal. Wall thickness  was normal. Systolic function was normal. The estimated  ejection fraction was in the range of 55% to 60%. Wall  motion was normal; there were no regional wall motion  abnormalities. Left ventricular diastolic function  parameters were normal.    Neuro/Psych PSYCHIATRIC DISORDERS Anxiety  Neuromuscular disease negative neurological ROS  negative psych ROS   GI/Hepatic negative GI ROS, Neg liver ROS,   Endo/Other  negative endocrine ROS  Renal/GU negative Renal ROS  negative genitourinary   Musculoskeletal negative musculoskeletal ROS (+)   Abdominal   Peds negative pediatric ROS (+)  Hematology negative hematology ROS (+)   Anesthesia Other Findings   Reproductive/Obstetrics negative OB ROS                            Anesthesia Physical Anesthesia Plan  ASA: III  Anesthesia Plan: General   Post-op Pain Management:    Induction: Intravenous  PONV Risk Score and Plan: 2 and TIVA and Treatment may vary due to age or  medical condition  Airway Management Planned: Oral ETT and LMA  Additional Equipment:   Intra-op Plan:   Post-operative Plan: Extubation in OR  Informed Consent: I have reviewed the patients History and Physical, chart, labs and discussed the procedure including the risks, benefits and alternatives for the proposed anesthesia with the patient or authorized representative who has indicated his/her understanding and acceptance.       Plan Discussed with: Anesthesiologist  Anesthesia Plan Comments: (See PAT note written 01/15/2020 by Myra Gianotti, PA-C.   For Anesthesia considerations,  - FETAL ALCOHOL SYNDROME history (see notation under allergies) - Mother reported that following 09/23/15 dental surgery, he had an apneic spell at home.  She did not have to take him to the ED as he started breathing again on his own. He subsequently had a normal sleep study in May 2017, but more recently has had worsening daytime sleepiness and is scheduled for a repeat sleep study in the near future. Dr. Rexene Alberts documented, "his Epworth sleepiness score is high at 22 out of 24, fatigue severity score is 63 out of 63. He snores."  )       Anesthesia Quick Evaluation

## 2020-01-15 NOTE — Progress Notes (Signed)
Anesthesia Chart Review: Paul [name]   Case: [id number] Date/Time: 01/16/20 0800   Procedure: MRI WITH ANESTHESIA  BRAIN WITH AND WITHOUT CONTRAST (N/A )   Anesthesia type: General   Pre-op diagnosis: MEORY LOSS.COGNITIVE DECLINE   Location: [facility] OR RADIOLOGY ROOM / [facility]   Surgeons: Radiologist, Medication, MD      DISCUSSION: Patient is a 29 year old male scheduled for MRI of the brain under anesthesia. MRI was ordered by neurologist Dr. Rexene [name] for evaluation of memory loss and sleepiness. A future sleep study and EEG are also ordered. He has history of fetal alcohol syndrome with developmental delays. His adoptive mother reported cognitive decline over the past 18 months. By notes he is minimally verbal, but able to do his own ADLS but takes much longer. Progress Note/H&P 01/03/20.   History includes never smoker, fetal alcohol syndrome, ADHD, narcolepsy, asthma (exercise induced and pollen related), auditory processing disorder, developmental delay, dental extractions (09/23/15), pyloromyotomy.   For Anesthesia considerations,  - FETAL ALCOHOL SYNDROME history (see notation under allergies) - Mother reported that following 09/23/15 dental surgery, he had an apneic spell at home.  She did not have to take him to the ED as he started breathing again on his own. He subsequently had a normal sleep study in May 2017, but more recently has had worsening daytime sleepiness and is scheduled for a repeat sleep study in the near future. Dr. Rexene [name] documented, "his Epworth sleepiness score is high at 22 out of 24, fatigue severity score is 63 out of 63. He snores."  Anesthesia history can be taken into consideration for anesthesia plan. Anesthesia team to evaluate on the day of surgery. 01/13/20 pre-procedure COVID-19 test negative.    VS: On 01/03/20, BP 122/80, HR 93. At 12/26/19 [facility] visit:  Blood Pressure 100/72 12/26/2019 12:46 PM EDT   Pulse 91 12/26/2019 12:46 PM EDT   Temperature 37.1 C  (98.7 F) 12/26/2019 12:46 PM EDT   Respiratory Rate 16 12/26/2019 12:46 PM EDT   Oxygen Saturation 97% 12/26/2019 12:46 PM EDT   Weight 110.2 kg (243 lb) 12/26/2019 12:46 PM EDT   Height 167.6 cm (5\' 6" ) 12/26/2019 12:46 PM EDT   Body Mass Index 39.22 12/26/2019 12:46 PM EDT      PROVIDERS: Paul [name], Paul [name] is listed as PCP. He was referred to neurology by Paul Pap, DO with [facility]. -He is not followed regularly by cardiology, but saw nation, Paul Salina, MD in 2015 at the request of his adoptive mother. Appears that due to his history, she requested a cardiology evaluation to evaluate for structural heart disease. He had a normal echo and as needed follow-up recommended.    LABS: He had labs on 01/01/20 at [facility] (See Care Everywhere) that showed: Cortisol 0.6, total vitamin D 22, total protein 7.4, albumin 4.4, total bilirubin 0.4, direct bilirubin 1.1, alkaline phosphatase 146, AST 21 ALT 27.  On 12/26/2019, WBC 8.5, hemoglobin 14.6, hematocrit 42.0, platelet count 288, free T4 0.9, TSH 0.981, sodium 137, potassium 4.0, glucose 98, creatinine 0.80, calcium 9.4, total cholesterol 122, HDL 30, LDL 81.   OTHER: Sleep Study and EEG ordered on 01/03/20, but have not been done yet.   Sleep Study 01/21/16: IMPRESSIONS  - No significant obstructive sleep apnea occurred during this  study (AHI = 2.7/h).  - No significant central sleep apnea occurred during this study  (CAI = 0.3/h).  - The patient snored with Moderate snoring volume.  - EKG findings include PVCs.  -  Clinically significant periodic limb movements did not occur  during sleep. No significant associated arousals.  DIAGNOSIS  - Primary Snoring (786.09 [R06.83 ICD-10])  - Normal study    EKG: Last EKG seen was on 12/28/13 and showed SR with marked sinus arrhythmia.   CV: Echo 01/10/14: Study Conclusions  - Left ventricle: The cavity size was normal. Wall thickness  was normal. Systolic  function was normal. The estimated  ejection fraction was in the range of 55% to 60%. Wall  motion was normal; there were no regional wall motion  abnormalities. Left ventricular diastolic function  parameters were normal.  - Aortic valve: There was no stenosis.  - Mitral valve: Trivial regurgitation.  - Right ventricle: The cavity size was normal. Systolic  function was normal.  - Pulmonary arteries: No complete TR doppler jet so unable  to estimate PA systolic pressure.  - Inferior vena cava: The vessel was normal in size; the  respirophasic diameter changes were in the normal range (=  50%); findings are consistent with normal central venous  pressure.  Impressions:  - Normal study.     Past Medical History:  Diagnosis Date  . ADHD (attention deficit hyperactivity disorder)   . Asthma    excercise induced and pollen  . Auditory processing disorder   . Complication of anesthesia    mother states pt. is harder to put under anes. and hard to wake up due to fetal alcohol syndrome; can be combative, per mother; states he will do better if mother is in PACU when he is coming out of anes.  . Development delay    states mental status of a 29 year old  . Exercise-induced asthma    prn inhaler/neb.  . Fetal alcohol syndrome   . Narcolepsy   . Non-restorable tooth 09/2015   teeth  . Separation anxiety   . Twin birth     Past Surgical History:  Procedure Laterality Date  . MULTIPLE EXTRACTIONS WITH ALVEOLOPLASTY N/A 09/23/2015   Procedure: MULTIPLE EXTRACTION WITH ALVEOLOPLASTY;  Surgeon: Paul [name], DDS;  Location: [place];  Service: Oral Surgery;  Laterality: N/A;  . PYLOROMYOTOMY     age [age], performed found to not have stenosis on surgical exam  . PYLOROMYOTOMY     did not have pyloric stenosis; did surgery on the wrong twin    MEDICATIONS: No current facility-administered medications for this encounter.   Marland Kitchen albuterol (PROVENTIL HFA;VENTOLIN HFA)  108 (90 BASE) MCG/ACT inhaler  . albuterol (PROVENTIL) (5 MG/ML) 0.5% nebulizer solution  . Cholecalciferol (VITAMIN D3) 250 MCG (10000 UT) capsule  . loratadine (CLARITIN) 10 MG tablet  . Multiple Vitamin (MULTIVITAMIN WITH MINERALS) TABS tablet    Myra Gianotti, PA-C Surgical Short Stay/Anesthesiology [facility] Phone [phone] [facility] Phone [phone] 01/15/2020 12:29 PM

## 2020-01-16 ENCOUNTER — Other Ambulatory Visit: Payer: Self-pay

## 2020-01-16 ENCOUNTER — Encounter (HOSPITAL_COMMUNITY): Payer: Self-pay

## 2020-01-16 ENCOUNTER — Ambulatory Visit (HOSPITAL_COMMUNITY)
Admission: RE | Admit: 2020-01-16 | Discharge: 2020-01-16 | Disposition: A | Discharge status: Home / Self Care | Payer: Medicare Other | Source: Ambulatory Visit | Attending: Neurology | Admitting: Neurology

## 2020-01-16 ENCOUNTER — Ambulatory Visit (HOSPITAL_COMMUNITY): Payer: Medicare Other | Admitting: Vascular Surgery

## 2020-01-16 ENCOUNTER — Ambulatory Visit (HOSPITAL_COMMUNITY)
Admission: RE | Admit: 2020-01-16 | Discharge: 2020-01-16 | Disposition: A | Discharge status: Home / Self Care | Payer: Medicare Other | Attending: Neurology | Admitting: Neurology

## 2020-01-16 ENCOUNTER — Encounter (HOSPITAL_COMMUNITY)
Admission: RE | Disposition: A | Discharge status: Home / Self Care | Payer: Self-pay | Source: Home / Self Care | Attending: Neurology

## 2020-01-16 DIAGNOSIS — R400 Somnolence: Secondary | ICD-10-CM | POA: Insufficient documentation

## 2020-01-16 DIAGNOSIS — R0683 Snoring: Secondary | ICD-10-CM | POA: Diagnosis not present

## 2020-01-16 DIAGNOSIS — G47419 Narcolepsy without cataplexy: Secondary | ICD-10-CM | POA: Insufficient documentation

## 2020-01-16 DIAGNOSIS — G4719 Other hypersomnia: Secondary | ICD-10-CM

## 2020-01-16 DIAGNOSIS — R413 Other amnesia: Secondary | ICD-10-CM | POA: Insufficient documentation

## 2020-01-16 DIAGNOSIS — R6889 Other general symptoms and signs: Secondary | ICD-10-CM

## 2020-01-16 DIAGNOSIS — R4181 Age-related cognitive decline: Secondary | ICD-10-CM | POA: Diagnosis not present

## 2020-01-16 DIAGNOSIS — R4189 Other symptoms and signs involving cognitive functions and awareness: Secondary | ICD-10-CM | POA: Diagnosis not present

## 2020-01-16 DIAGNOSIS — Q860 Fetal alcohol syndrome (dysmorphic): Secondary | ICD-10-CM | POA: Insufficient documentation

## 2020-01-16 DIAGNOSIS — E669 Obesity, unspecified: Secondary | ICD-10-CM | POA: Insufficient documentation

## 2020-01-16 HISTORY — PX: RADIOLOGY WITH ANESTHESIA: SHX6223

## 2020-01-16 HISTORY — DX: Other specified behavioral and emotional disorders with onset usually occurring in childhood and adolescence: F98.8

## 2020-01-16 SURGERY — MRI WITH ANESTHESIA
Anesthesia: General

## 2020-01-16 MED ORDER — LACTATED RINGERS IV SOLN: INTRAVENOUS | Status: DC

## 2020-01-16 MED ORDER — PROPOFOL 500 MG/50ML IV EMUL
INTRAVENOUS | Status: DC | PRN
  Administered 2020-01-16: 100 ug/kg/min via INTRAVENOUS

## 2020-01-16 MED ORDER — ACETAMINOPHEN 325 MG PO TABS: 325.0000 mg | ORAL_TABLET | ORAL | Status: DC | PRN

## 2020-01-16 MED ORDER — OXYCODONE HCL 5 MG/5ML PO SOLN: 5.0000 mg | Freq: Once | ORAL | Status: DC | PRN

## 2020-01-16 MED ORDER — OXYCODONE HCL 5 MG PO TABS: 5.0000 mg | ORAL_TABLET | Freq: Once | ORAL | Status: DC | PRN

## 2020-01-16 MED ORDER — PROPOFOL 10 MG/ML IV BOLUS
INTRAVENOUS | Status: DC | PRN
  Administered 2020-01-16: 30 mg via INTRAVENOUS
  Administered 2020-01-16: 200 mg via INTRAVENOUS
  Administered 2020-01-16: 40 mg via INTRAVENOUS
  Administered 2020-01-16: 20 mg via INTRAVENOUS

## 2020-01-16 MED ORDER — LIDOCAINE 2% (20 MG/ML) 5 ML SYRINGE
INTRAMUSCULAR | Status: DC | PRN
  Administered 2020-01-16: 60 mg via INTRAVENOUS

## 2020-01-16 MED ORDER — ACETAMINOPHEN 160 MG/5ML PO SOLN: 325.0000 mg | ORAL | Status: DC | PRN

## 2020-01-16 MED ORDER — FENTANYL CITRATE (PF) 100 MCG/2ML IJ SOLN: 25.0000 ug | INTRAMUSCULAR | Status: DC | PRN

## 2020-01-16 MED ORDER — ONDANSETRON HCL 4 MG/2ML IJ SOLN: 4.0000 mg | Freq: Once | INTRAMUSCULAR | Status: DC | PRN

## 2020-01-16 MED ORDER — MEPERIDINE HCL 25 MG/ML IJ SOLN: 6.2500 mg | INTRAMUSCULAR | Status: DC | PRN

## 2020-01-16 MED ORDER — GADOBUTROL 1 MMOL/ML IV SOLN
10.0000 mL | Freq: Once | INTRAVENOUS | Status: AC | PRN
  Administered 2020-01-16: 09:00:00 10 mL via INTRAVENOUS

## 2020-01-16 MED ORDER — SUCCINYLCHOLINE CHLORIDE 200 MG/10ML IV SOSY
PREFILLED_SYRINGE | INTRAVENOUS | Status: DC | PRN
  Administered 2020-01-16: 100 mg via INTRAVENOUS
  Administered 2020-01-16: 60 mg via INTRAVENOUS

## 2020-01-16 NOTE — Anesthesia Postprocedure Evaluation (Signed)
Anesthesia Post Note  Patient: Paul [name]  Procedure(s) Performed: MRI WITH ANESTHESIA  BRAIN WITH AND WITHOUT CONTRAST (N/A )     Patient location during evaluation: PACU Anesthesia Type: General Level of consciousness: awake and alert Pain management: pain level controlled Vital Signs Assessment: post-procedure vital signs reviewed and stable Respiratory status: spontaneous breathing, nonlabored ventilation, respiratory function stable and patient connected to nasal cannula oxygen Cardiovascular status: blood pressure returned to baseline and stable Postop Assessment: no apparent nausea or vomiting Anesthetic complications: no    Last Vitals:  Vitals:   01/16/20 1015 01/16/20 1047  BP: (!) 120/48   Pulse: 99   Resp: 15   Temp: (!) 36.2 C (!) 36.3 C  SpO2: 94%     Last Pain:  Vitals:   01/16/20 1047  TempSrc:   PainSc: 0-No pain                 Latashia Koch

## 2020-01-16 NOTE — Progress Notes (Signed)
No labs per Dr. Ambrose Pancoast.

## 2020-01-16 NOTE — Interval H&P Note (Signed)
Anesthesia H&P Update: History and Physical Exam reviewed; patient is OK for planned anesthetic and procedure. ? ?

## 2020-01-16 NOTE — Transfer of Care (Signed)
Immediate Anesthesia Transfer of Care Note  Patient: Paul [name]  Procedure(s) Performed: MRI WITH ANESTHESIA  BRAIN WITH AND WITHOUT CONTRAST (N/A )  Patient Location: PACU  Anesthesia Type:General  Level of Consciousness: drowsy and pateint uncooperative  Airway & Oxygen Therapy: Patient Spontanous Breathing and Patient connected to nasal cannula oxygen  Post-op Assessment: Report given to RN  Post vital signs: Reviewed and stable  Last Vitals:  Vitals Value Taken Time  BP 120/48 01/16/20 1018  Temp    Pulse 92 01/16/20 1023  Resp 17 01/16/20 1019  SpO2 87 % 01/16/20 1023  Vitals shown include unvalidated device data.  Last Pain:  Vitals:   01/16/20 1015  TempSrc:   PainSc: (P) Asleep      Patients Stated Pain Goal: (mother states pt. cannot understand our scale) ([id number])  Complications: No apparent anesthesia complications and patient restless, mother called to bedside for support

## 2020-01-16 NOTE — Anesthesia Procedure Notes (Signed)
Procedure Name: Intubation Date/Time: 01/16/2020 8:35 AM Performed by: Barrington Ellison, CRNA Pre-anesthesia Checklist: Patient identified, Emergency Drugs available, Suction available and Patient being monitored Patient Re-evaluated:Patient Re-evaluated prior to induction Oxygen Delivery Method: Circle System Utilized Preoxygenation: Pre-oxygenation with 100% oxygen Induction Type: IV induction and Rapid sequence Ventilation: Mask ventilation without difficulty and Two handed mask ventilation required Laryngoscope Size: Mac and 4 Grade View: Grade I Tube type: Oral Tube size: 7.5 mm Number of attempts: 1 Airway Equipment and Method: Stylet and Oral airway Placement Confirmation: ETT inserted through vocal cords under direct vision,  positive ETCO2 and breath sounds checked- equal and bilateral Secured at: 22 cm Tube secured with: Tape Dental Injury: Teeth and Oropharynx as per pre-operative assessment

## 2020-01-19 ENCOUNTER — Telehealth: Payer: Self-pay

## 2020-01-19 NOTE — Telephone Encounter (Signed)
From: Britt Bottom, MD  Sent: 01/16/2020  6:28 PM EDT  To: Star Age, MD  Subject: RE: second look                  It looks normal. There is no significant atrophy.   Pt's mom notified of MRI results.

## 2020-01-25 ENCOUNTER — Ambulatory Visit (INDEPENDENT_AMBULATORY_CARE_PROVIDER_SITE_OTHER): Payer: Medicare Other | Admitting: Neurology

## 2020-01-25 ENCOUNTER — Other Ambulatory Visit: Payer: Self-pay

## 2020-01-25 DIAGNOSIS — Q860 Fetal alcohol syndrome (dysmorphic): Secondary | ICD-10-CM

## 2020-01-25 DIAGNOSIS — R4189 Other symptoms and signs involving cognitive functions and awareness: Secondary | ICD-10-CM

## 2020-01-25 DIAGNOSIS — R413 Other amnesia: Secondary | ICD-10-CM

## 2020-01-25 DIAGNOSIS — G4720 Circadian rhythm sleep disorder, unspecified type: Secondary | ICD-10-CM

## 2020-01-25 DIAGNOSIS — G4733 Obstructive sleep apnea (adult) (pediatric): Secondary | ICD-10-CM | POA: Diagnosis not present

## 2020-01-25 DIAGNOSIS — R0683 Snoring: Secondary | ICD-10-CM

## 2020-01-25 DIAGNOSIS — E669 Obesity, unspecified: Secondary | ICD-10-CM

## 2020-01-25 DIAGNOSIS — R6889 Other general symptoms and signs: Secondary | ICD-10-CM

## 2020-01-25 DIAGNOSIS — R400 Somnolence: Secondary | ICD-10-CM

## 2020-01-25 DIAGNOSIS — G4719 Other hypersomnia: Secondary | ICD-10-CM

## 2020-02-01 ENCOUNTER — Other Ambulatory Visit: Payer: Self-pay

## 2020-02-01 ENCOUNTER — Ambulatory Visit (INDEPENDENT_AMBULATORY_CARE_PROVIDER_SITE_OTHER): Payer: Medicare Other | Admitting: Neurology

## 2020-02-01 DIAGNOSIS — G4719 Other hypersomnia: Secondary | ICD-10-CM

## 2020-02-01 DIAGNOSIS — R4189 Other symptoms and signs involving cognitive functions and awareness: Secondary | ICD-10-CM

## 2020-02-01 DIAGNOSIS — R6889 Other general symptoms and signs: Secondary | ICD-10-CM

## 2020-02-01 DIAGNOSIS — R400 Somnolence: Secondary | ICD-10-CM

## 2020-02-01 DIAGNOSIS — Q860 Fetal alcohol syndrome (dysmorphic): Secondary | ICD-10-CM

## 2020-02-01 DIAGNOSIS — R410 Disorientation, unspecified: Secondary | ICD-10-CM

## 2020-02-01 DIAGNOSIS — E669 Obesity, unspecified: Secondary | ICD-10-CM

## 2020-02-01 DIAGNOSIS — R0683 Snoring: Secondary | ICD-10-CM

## 2020-02-01 DIAGNOSIS — R413 Other amnesia: Secondary | ICD-10-CM

## 2020-02-07 ENCOUNTER — Telehealth: Payer: Self-pay

## 2020-02-07 DIAGNOSIS — R404 Transient alteration of awareness: Secondary | ICD-10-CM | POA: Diagnosis not present

## 2020-02-07 DIAGNOSIS — L309 Dermatitis, unspecified: Secondary | ICD-10-CM | POA: Diagnosis not present

## 2020-02-07 DIAGNOSIS — H6121 Impacted cerumen, right ear: Secondary | ICD-10-CM | POA: Diagnosis not present

## 2020-02-07 DIAGNOSIS — H579 Unspecified disorder of eye and adnexa: Secondary | ICD-10-CM | POA: Diagnosis not present

## 2020-02-07 NOTE — Addendum Note (Signed)
Addended by: Star Age on: 02/07/2020 07:15 AM   Modules accepted: Orders

## 2020-02-07 NOTE — Progress Notes (Signed)
Patient referred by Dr. Rayann Heman, seen by me on 01/03/20, diagnostic PSG on 01/25/20.   Please call and notify the patient's mom that the recent sleep study showed moderate to severe obstructive sleep apnea. I recommend treatment for this in the form of CPAP. This will require a repeat sleep study for proper titration and mask fitting and correct monitoring of the oxygen saturations. Please explain to patient. I have placed an order in the chart. Thanks.  Star Age, MD, PhD [facility])

## 2020-02-07 NOTE — Procedures (Signed)
PATIENT'S NAME:  Paul [name], Paul [name] DOB:      1991-08-14      MR#:    [id number]     DATE OF RECORDING: 01/25/2020 REFERRING M.D.:  Irene Pap, DO Study Performed:   Baseline Polysomnogram HISTORY: 29 year old man with a history of allergies, fetal alcohol syndrome, with developmental delays, ADHD, vitamin D deficiency, asthma, and obesity, who has had cognitive decline over the past 18 months per mom. The patient endorsed the Epworth Sleepiness Scale at 22 points. The patient's weight 241 pounds with a height of 68 (inches), resulting in a BMI of 36.4 kg/m2.   CURRENT MEDICATIONS: Proventil, Claritin   PROCEDURE:  This is a multichannel digital polysomnogram utilizing the Somnostar 11.2 system.  Electrodes and sensors were applied and monitored per AASM Specifications.   EEG, EOG, Chin and Limb EMG, were sampled at 200 Hz.  ECG, Snore and Nasal Pressure, Thermal Airflow, Respiratory Effort, CPAP Flow and Pressure, Oximetry was sampled at 50 Hz. Digital video and audio were recorded.      BASELINE STUDY  Lights Out was at 21:52 and Lights On at 04:50.  Total recording time (TRT) was 418 minutes, with a total sleep time (TST) of 364.5 minutes.   The patient's sleep latency was 1.5 minutes. REM latency was 4 minutes, which is reduced. The sleep efficiency was 87.2 %.     SLEEP ARCHITECTURE: WASO (Wake after sleep onset) was 50.5 minutes with one longer period of wakefulness and otherwise minimal to minimal sleep fragmentation noted. There were 9 minutes in Stage N1, 193 minutes Stage N2, 101 minutes Stage N3 and 61.5 minutes in Stage REM.  The percentage of Stage N1 was 2.5%, Stage N2 was 52.9%, Stage N3 was 27.7%, which is mildly increased, and Stage R (REM sleep) was 16.9%, which is mildly reduced. The arousals were noted as: 17 were spontaneous, 2 were associated with PLMs, 27 were associated with respiratory events.  RESPIRATORY ANALYSIS:  There were a total of 111 respiratory events:  44  obstructive apneas, 0 central apneas and 0 mixed apneas with a total of 44 apneas and an apnea index (AI) of 7.2 /hour. There were 67 hypopneas with a hypopnea index of 11. /hour. The patient also had 0 respiratory event related arousals (RERAs).      The total APNEA/HYPOPNEA INDEX (AHI) was 18.3/hour and the total RESPIRATORY DISTURBANCE INDEX was  18.3 /hour.  61 events occurred in REM sleep and 58 events in NREM. The REM AHI was  59.5 /hour, versus a non-REM AHI of 9.9. The patient spent 364.5 minutes of total sleep time in the supine position and 0 minutes in non-supine.. The supine AHI was 18.2 versus a non-supine AHI of 0.0.  OXYGEN SATURATION & C02:  The Wake baseline 02 saturation was 94%, with the lowest being 84%. Time spent below 89% saturation equaled 18 minutes.  PERIODIC LIMB MOVEMENTS: The patient had a total of 5 Periodic Limb Movements.  The Periodic Limb Movement (PLM) index was .8 and the PLM Arousal index was .3/hour.  Audio and video analysis did not show any abnormal or unusual movements, behaviors, phonations or vocalizations. The patient took bathroom breaks. Mild to moderate snoring was noted. The EKG was in keeping with normal sinus rhythm (NSR).  Post-study, the patient indicated that sleep was the same as usual.   IMPRESSION:  1. Obstructive Sleep Apnea (OSA) 2. Dysfunctions associated with sleep stages or arousal from sleep  RECOMMENDATIONS:  1. This study demonstrates moderate to  severe obstructive sleep apnea, with a total AHI of 18.3/hour, REM AHI of 59.5/hour, supine AHI of 18.2/hour and O2 nadir of 84%. Treatment with positive airway pressure in the form of CPAP is recommended. This will require a full night titration study to optimize therapy. Other treatment options or adjunct treatments may include avoidance of supine sleep position along with weight loss, upper airway or jaw surgery in selected patients or the use of an oral appliance in certain patients. ENT  evaluation and/or consultation with a maxillofacial surgeon or dentist may be feasible in some instances.    2. Please note that untreated obstructive sleep apnea carries additional perioperative morbidity. Patients with significant obstructive sleep apnea should receive perioperative PAP therapy and the surgeons and particularly the anesthesiologist should be informed of the diagnosis and the severity of the sleep disordered breathing. 3. This study shows no significant sleep fragmentation and mildly abnormal sleep stage percentages. He had early onset REM sleep, but a mildly reduced REM percentage; these are nonspecific findings and per se do not signify an intrinsic sleep disorder or a cause for the patient's sleep-related symptoms. Causes include (but are not limited to) the first night effect of the sleep study, circadian rhythm disturbances, medication effect or an underlying mood disorder or medical problem.  4. The patient should be cautioned not to drive, work at heights, or operate dangerous or heavy equipment when tired or sleepy. Review and reiteration of good sleep hygiene measures should be pursued with any patient. 5. The patient will be seen in follow-up in the sleep clinic at [facility] for discussion of the test results, symptom and treatment compliance review, further management strategies, etc. The referring provider will be notified of the test results.  I certify that I have reviewed the entire raw data recording prior to the issuance of this report in accordance with the Standards of Accreditation of the American Academy of Sleep Medicine (AASM)   Star Age, MD, PhD Diplomat, American Board of Neurology and Sleep Medicine (Neurology and Sleep Medicine)

## 2020-02-07 NOTE — Telephone Encounter (Signed)
I called pt and spoke with his mom. I advised pt that Dr. Rexene Alberts reviewed their sleep study results and found that pt had moderate to severe osa and recommends that pt be treated with a cpap. Dr. Rexene Alberts recommends that pt return for a repeat sleep study in order to properly titrate the cpap and ensure a good mask fit. Pt is agreeable to returning for a titration study. I advised pt that our sleep lab will file with pt's insurance and call pt to schedule the sleep study when we hear back from the pt's insurance regarding coverage of this sleep study. Pt verbalized understanding of results. Pt had no questions at this time but was encouraged to call back if questions arise.

## 2020-02-07 NOTE — Telephone Encounter (Signed)
-----   Message from Star Age, MD sent at 02/07/2020  7:15 AM EDT ----- Patient referred by Dr. Rayann Heman, seen by me on 01/03/20, diagnostic PSG on 01/25/20.   Please call and notify the patient's mom that the recent sleep study showed moderate to severe obstructive sleep apnea. I recommend treatment for this in the form of CPAP. This will require a repeat sleep study for proper titration and mask fitting and correct monitoring of the oxygen saturations. Please explain to patient. I have placed an order in the chart. Thanks.  Star Age, MD, PhD [facility])

## 2020-02-08 NOTE — Progress Notes (Signed)
Please call and advise the patient that the EEG or brain wave test we performed was reported as normal in the awake state. We checked for abnormal electrical discharges in the brain waves and the report suggested normal findings. No further action is required on this test at this time. Please remind patient to keep any upcoming appointments or tests and to call us with any interim questions, concerns, problems or updates. Thanks,  Cassadee Vanzandt, MD, PhD  

## 2020-02-08 NOTE — Procedures (Signed)
   HISTORY: 29 year old male presented with memory loss, and sleepiness.  TECHNIQUE:  This is a routine 16 channel EEG recording with one channel devoted to a limited EKG recording.  It was performed during wakefulness, drowsiness and asleep.  Hyperventilation and photic stimulation were performed as activating procedures.  There are minimum muscle and movement artifact noted.  Upon maximum arousal, posterior dominant waking rhythm consistent of rhythmic alpha range activity, with frequency of 9 hz. Activities are symmetric over the bilateral posterior derivations and attenuated with eye opening.  Hyperventilation produced mild/moderate buildup with higher amplitude and the slower activities noted.  Photic stimulation did not alter the tracing.  During EEG recording, patient developed drowsiness and no deeper stage of sleep was achieved During EEG recording, there was no epileptiform discharge noted.  EKG demonstrate sinus rhythm, with heart rate of 80 bpm  CONCLUSION: This is a  normal awake EEG.  There is no electrodiagnostic evidence of epileptiform discharge.  Marcial Pacas, M.D. Ph.D.  [facility] [place], [place] [address] Phone: [phone] Fax:      [phone]

## 2020-02-09 DIAGNOSIS — B86 Scabies: Secondary | ICD-10-CM | POA: Diagnosis not present

## 2020-02-09 DIAGNOSIS — L209 Atopic dermatitis, unspecified: Secondary | ICD-10-CM | POA: Diagnosis not present

## 2020-02-12 ENCOUNTER — Telehealth: Payer: Self-pay

## 2020-02-12 NOTE — Telephone Encounter (Signed)
-----   Message from Star Age, MD sent at 02/08/2020  6:37 PM EDT ----- Please call and advise the patient that the EEG or brain wave test we performed was reported as normal in the awake state. We checked for abnormal electrical discharges in the brain waves and the report suggested normal findings. No further action is required on this test at this time. Please remind patient to keep any upcoming appointments or tests and to call us with any interim questions, concerns, problems or updates. Thanks,  Star Age, MD, PhD

## 2020-02-12 NOTE — Telephone Encounter (Signed)
I contacted the pt's mom and advised of results(ok per dpr) She verbalized understanding and had no questions/ concerns.

## 2020-02-27 ENCOUNTER — Other Ambulatory Visit (HOSPITAL_COMMUNITY)
Admission: RE | Admit: 2020-02-27 | Discharge: 2020-02-27 | Disposition: A | Discharge status: Home / Self Care | Payer: Medicare Other | Source: Ambulatory Visit | Attending: Neurology | Admitting: Neurology

## 2020-02-27 DIAGNOSIS — Z01812 Encounter for preprocedural laboratory examination: Secondary | ICD-10-CM | POA: Diagnosis present

## 2020-02-27 DIAGNOSIS — Z20822 Contact with and (suspected) exposure to covid-19: Secondary | ICD-10-CM | POA: Diagnosis not present

## 2020-02-27 LAB — SARS CORONAVIRUS 2 (TAT 6-24 HRS): SARS Coronavirus 2: NEGATIVE

## 2020-02-28 ENCOUNTER — Ambulatory Visit (INDEPENDENT_AMBULATORY_CARE_PROVIDER_SITE_OTHER): Payer: Medicare Other | Admitting: Neurology

## 2020-02-28 ENCOUNTER — Other Ambulatory Visit: Payer: Self-pay

## 2020-02-28 DIAGNOSIS — R4189 Other symptoms and signs involving cognitive functions and awareness: Secondary | ICD-10-CM

## 2020-02-28 DIAGNOSIS — G4719 Other hypersomnia: Secondary | ICD-10-CM

## 2020-02-28 DIAGNOSIS — G4733 Obstructive sleep apnea (adult) (pediatric): Secondary | ICD-10-CM | POA: Diagnosis not present

## 2020-02-28 DIAGNOSIS — E669 Obesity, unspecified: Secondary | ICD-10-CM

## 2020-02-28 DIAGNOSIS — Q860 Fetal alcohol syndrome (dysmorphic): Secondary | ICD-10-CM

## 2020-02-28 DIAGNOSIS — R400 Somnolence: Secondary | ICD-10-CM

## 2020-02-28 DIAGNOSIS — R413 Other amnesia: Secondary | ICD-10-CM

## 2020-02-28 DIAGNOSIS — R6889 Other general symptoms and signs: Secondary | ICD-10-CM

## 2020-02-28 DIAGNOSIS — G4720 Circadian rhythm sleep disorder, unspecified type: Secondary | ICD-10-CM

## 2020-03-14 ENCOUNTER — Telehealth: Payer: Self-pay

## 2020-03-14 NOTE — Addendum Note (Signed)
Addended by: Star Age on: 03/14/2020 07:11 AM   Modules accepted: Orders

## 2020-03-14 NOTE — Telephone Encounter (Addendum)
I called pt and spoke with his mom ( ok per dpr). I advised that Dr. Rexene Alberts reviewed their sleep study results and found that pt did well during recent study on cpap. Dr. Rexene Alberts recommends that pt start cpap at home for treatment. I reviewed PAP compliance expectations with the pt. Pt is agreeable to starting a CPAP. I advised pt that an order will be sent to a DME, [facility], and [employer] will call the pt within about one week after they file with the pt's insurance. [facility] will show the pt how to use the machine, fit for masks, and troubleshoot the CPAP if needed. A follow up appt was made for insurance purposes with Dr. Rexene Alberts on 05/23/2020 at 100 pm. Pt verbalized understanding to arrive 15 minutes early and bring their CPAP. A letter with all of this information in it will be mailed to the pt as a reminder. I verified with the pt that the address we have on file is correct. Pt verbalized understanding of results. Pt had no questions at this time but was encouraged to call back if questions arise. I have sent the order to [facility] and have received confirmation that they have received the order.

## 2020-03-14 NOTE — Progress Notes (Signed)
Patient referred by Dr. Rayann Heman, seen by me on 01/03/20, diagnostic PSG on 01/25/20.  Patient had a CPAP titration study on 02/28/20.  Please call and inform patient's mom (she had stayed for the sleep study) that I have entered an order for treatment with positive airway pressure (PAP) treatment for obstructive sleep apnea (OSA). He did well during the latest sleep study with CPAP. We will, therefore, arrange for a machine for home use through a DME (durable medical equipment) company of His choice; and I will see the patient back in follow-up in about 10 weeks. Please also explain to the patient that I will be looking out for compliance data, which can be downloaded from the machine (stored on an SD card, that is inserted in the machine) or via remote access through a modem, that is built into the machine. At the time of the followup appointment we will discuss sleep study results and how it is going with PAP treatment at home. Please advise them to bring His machine at the time of the first FU visit, even though this is cumbersome. Bringing the machine for every visit after that will likely not be needed, but often helps for the first visit to troubleshoot if needed. Please re-enforce the importance of compliance with treatment and the need for [place] to monitor compliance data - often an insurance requirement and actually good feedback for the patient as far as how they are doing.  Also remind patient, that any interim PAP machine or mask issues should be first addressed with the DME company, as they can often help better with technical and mask fit issues. Please ask if patient has a preference regarding DME company.  Please also make sure, the patient has a follow-up appointment with me in about 10 weeks from the setup date, thanks. May see one of our nurse practitioners if needed for proper timing of the FU appointment.  Please fax or route report to the referring provider. Thanks,   Star Age, MD,  PhD [facility])

## 2020-03-14 NOTE — Telephone Encounter (Signed)
-----   Message from Star Age, MD sent at 03/14/2020  7:11 AM EDT ----- Patient referred by Dr. Rayann Heman, seen by me on 01/03/20, diagnostic PSG on 01/25/20.  Patient had a CPAP titration study on 02/28/20.  Please call and inform patient's mom (she had stayed for the sleep study) that I have entered an order for treatment with positive airway pressure (PAP) treatment for obstructive sleep apnea (OSA). He did well during the latest sleep study with CPAP. We will, therefore, arrange for a machine for home use through a DME (durable medical equipment) company of His choice; and I will see the patient back in follow-up in about 10 weeks. Please also explain to the patient that I will be looking out for compliance data, which can be downloaded from the machine (stored on an SD card, that is inserted in the machine) or via remote access through a modem, that is built into the machine. At the time of the followup appointment we will discuss sleep study results and how it is going with PAP treatment at home. Please advise them to bring His machine at the time of the first FU visit, even though this is cumbersome. Bringing the machine for every visit after that will likely not be needed, but often helps for the first visit to troubleshoot if needed. Please re-enforce the importance of compliance with treatment and the need for [place] to monitor compliance data - often an insurance requirement and actually good feedback for the patient as far as how they are doing.  Also remind patient, that any interim PAP machine or mask issues should be first addressed with the DME company, as they can often help better with technical and mask fit issues. Please ask if patient has a preference regarding DME company.  Please also make sure, the patient has a follow-up appointment with me in about 10 weeks from the setup date, thanks. May see one of our nurse practitioners if needed for proper timing of the FU appointment.  Please fax or route  report to the referring provider. Thanks,   Star Age, MD, PhD [facility])

## 2020-03-14 NOTE — Procedures (Signed)
PATIENT'S NAME:  Paul [name], Schoenfelder DOB:      1990-11-03      MR#:    [id number]     DATE OF RECORDING: 02/28/2020 REFERRING M.D.:  Irene Pap, DO Study Performed:   CPAP  Titration HISTORY: 29 year old man with a history of allergies, fetal alcohol syndrome, with developmental delays, ADHD, vitamin D deficiency, asthma, and obesity, who presents for a full night titration study. His baseline sleep study from 01/25/20 showed moderate to severe obstructive sleep apnea, with a total AHI of 18.3/hour, REM AHI of 59.5/hour, supine AHI of 18.2/hour and O2 nadir of 84%. The patient's weight 241 pounds with a height of 68 (inches), resulting in a BMI of 36.4 kg/m2.  CURRENT MEDICATIONS: Proventil, Claritin  PROCEDURE:  This is a multichannel digital polysomnogram utilizing the SomnoStar 11.2 system.  Electrodes and sensors were applied and monitored per AASM Specifications.   EEG, EOG, Chin and Limb EMG, were sampled at 200 Hz.  ECG, Snore and Nasal Pressure, Thermal Airflow, Respiratory Effort, CPAP Flow and Pressure, Oximetry was sampled at 50 Hz. Digital video and audio were recorded.      The patient was accompanied by his mother. The patient was fitted with a small Simplus FFM. CPAP was initiated at 5 cmH20 with heated humidity per AASM standards and pressure was advanced to 9 cmH20 because of hypopneas, apneas and desaturations.  At a PAP pressure of 9 cmH20, there was a reduction of the AHI to 0.8/hour with supine REM sleep achieved and O2 nadir of 92%.   Lights Out was at 20:48 and Lights On at 04:57. Total recording time (TRT) was 490 minutes, with a total sleep time (TST) of 484 minutes. The patient's sleep latency was 3 minutes. REM latency was 9.5 minutes.  The sleep efficiency was 98.8 %.    SLEEP ARCHITECTURE: WASO (Wake after sleep onset) was 2 minutes.  There were 5 minutes in Stage N1, 271 minutes Stage N2, 100 minutes Stage N3 and 108 minutes in Stage REM.  The percentage of Stage N1 was 1.%,  Stage N2 was 56.%, which is near-normal, Stage N3 was 20.7%, which is normal, and Stage R (REM sleep) was 22.3%, which is normal.   RESPIRATORY ANALYSIS:  There was a total of 22 respiratory events: 2 obstructive apneas, 3 central apneas and 1 mixed apneas with a total of 6 apneas and an apnea index (AHI) of .7 /hour. There were 16 hypopneas with a hypopnea index of 2./hour. The patient also had 0 respiratory event related arousals (RERAs).      The total APNEA/HYPOPNEA INDEX  (AHI) was 2.7 /hour and the total RESPIRATORY DISTURBANCE INDEX was 2.7 /hour  15 events occurred in REM sleep and 7 events in NREM. The REM AHI was 8.3 /hour versus a non-REM AHI of 1.1 /hour.  The patient spent 484 minutes of total sleep time in the supine position and 0 minutes in non-supine. The supine AHI was 2.7, versus a non-supine AHI of 0.0.  OXYGEN SATURATION & C02:  The baseline 02 saturation was 90%, with the lowest being 81%. Time spent below 89% saturation equaled 8 minutes.  PERIODIC LIMB MOVEMENTS:  The patient had a total of 5 Periodic Limb Movements. The Periodic Limb Movement (PLM) index was .6 and the PLM Arousal index was .1 /hour.  Audio and video analysis did not show any abnormal or unusual movements, behaviors, phonations or vocalizations. The patient took bathroom breaks. The EKG was in keeping with normal sinus  rhythm (NSR).   Post-study, the patient's mother indicated that he slept better than usual.    IMPRESSION:   1. Obstructive Sleep Apnea (OSA)  RECOMMENDATIONS:   1. This study demonstrates resolution of the patient's obstructive sleep apnea with CPAP therapy. I will, therefore, start the patient on home CPAP treatment at a pressure of 9 cm via small full face mask with (heated) humidity. The patient should be reminded to be fully compliant with PAP therapy to improve sleep related symptoms and decrease long term cardiovascular risks. The patient should be reminded, that it may take up to 3  months to get fully used to using PAP with all planned sleep. The earlier full compliance is achieved, the better long term compliance tends to be. Please note that untreated obstructive sleep apnea may carry additional perioperative morbidity. Patients with significant obstructive sleep apnea should receive perioperative PAP therapy and the surgeons and particularly the anesthesiologist should be informed of the diagnosis and the severity of the sleep disordered breathing. 2. The patient should be cautioned not to drive, work at heights, or operate dangerous or heavy equipment when tired or sleepy. Review and reiteration of good sleep hygiene measures should be pursued with any patient. 3. The patient will be seen in follow-up in the sleep clinic at [facility] for discussion of the test results, symptom and treatment compliance review, further management strategies, etc. The referring provider will be notified of the test results.   I certify that I have reviewed the entire raw data recording prior to the issuance of this report in accordance with the Standards of Accreditation of the American Academy of Sleep Medicine (AASM)     Star Age, MD, PhD Diplomat, American Board of Neurology and Sleep Medicine (Neurology and Sleep Medicine)

## 2020-04-10 DIAGNOSIS — S91332A Puncture wound without foreign body, left foot, initial encounter: Secondary | ICD-10-CM | POA: Diagnosis not present

## 2020-04-10 DIAGNOSIS — L089 Local infection of the skin and subcutaneous tissue, unspecified: Secondary | ICD-10-CM | POA: Diagnosis not present

## 2020-04-11 DIAGNOSIS — S91332A Puncture wound without foreign body, left foot, initial encounter: Secondary | ICD-10-CM | POA: Diagnosis not present

## 2020-05-23 ENCOUNTER — Ambulatory Visit: Payer: Self-pay | Admitting: Neurology

## 2020-06-11 ENCOUNTER — Encounter: Payer: Self-pay | Admitting: Neurology

## 2020-06-11 ENCOUNTER — Other Ambulatory Visit: Payer: Self-pay

## 2020-06-11 ENCOUNTER — Ambulatory Visit (INDEPENDENT_AMBULATORY_CARE_PROVIDER_SITE_OTHER): Payer: Medicare Other | Admitting: Neurology

## 2020-06-11 VITALS — BP 118/82 | HR 97 | Ht 68.0 in | Wt 257.0 lb

## 2020-06-11 DIAGNOSIS — R4189 Other symptoms and signs involving cognitive functions and awareness: Secondary | ICD-10-CM

## 2020-06-11 DIAGNOSIS — G4733 Obstructive sleep apnea (adult) (pediatric): Secondary | ICD-10-CM

## 2020-06-11 NOTE — Patient Instructions (Signed)
Please continue using your CPAP regularly. While your insurance requires that you use CPAP at least 4 hours each night on 70% of the nights, I recommend, that you not skip any nights and use it throughout the night if you can. Getting used to CPAP and staying with the treatment long term does take time and patience and discipline. Untreated obstructive sleep apnea when it is moderate to severe can have an adverse impact on cardiovascular health and raise her risk for heart disease, arrhythmias, hypertension, congestive heart failure, stroke and diabetes. Untreated obstructive sleep apnea causes sleep disruption, nonrestorative sleep, and sleep deprivation. This can have an impact on your day to day functioning and cause daytime sleepiness and impairment of cognitive function, memory loss, mood disturbance, and problems focussing. Using CPAP regularly can improve these symptoms.  As discussed, we will review your CPAP compliance data in about a month from now.  Please call us or email [place] through [employer] so we can pull a remote report from your machine.  Please call back to schedule your follow-up to see one of our nurse practitioners in 3 to 4 months.  If you change your mind regarding the referral to a memory disorders clinic I would be happy to place a referral.

## 2020-06-11 NOTE — Progress Notes (Signed)
Subjective:    Patient ID: Paul [name] is a 29 y.o. male.  HPI     Interim history:  Paul [name] is a 29 year old right-handed gentleman with an underlying medical history of allergies, fetal alcohol syndrome, with developmental delays, ADHD, vitamin D deficiency, asthma, and obesity, who Presents for follow-up consultation of his cognitive decline.  He is accompanied by his mother again today.  I first met him at the request of his primary care physician on 01/03/2020, at which time his mother reported a cognitive decline over the previous 18 months.  His MMSE was 21 out of 30 at the time.  He was unable to give his own history.  He had had an MRI many years ago.  I suggested further work-up in the form of brain MRI, EEG and sleep testing.  He had a brain MRI with and without contrast under anesthesia as requested by mom on 01/16/2020 and I reviewed the results: IMPRESSION: No acute intracranial abnormality, and unremarkable MRI appearance of the brain.  He had an EEG on 02/01/2020 and I reviewed the results: CONCLUSION: This is a  normal awake EEG.  There is no electrodiagnostic evidence of epileptiform discharge.  He had a baseline sleep study on 01/25/2020 which showed a sleep efficiency of 87.2%, sleep latency 1.5 minutes, REM latency was 4 minutes, REM percentage 16.9%.  He had moderate to severe obstructive sleep apnea with a total AHI of 18.3/h, REM AHI of 59.5/h, and supine AHI of 18.2/h.  Average oxygen saturation was 94%, nadir was 85%.  He was invited back for a CPAP titration study, which he had on 02/28/2020.  He was fitted with a small Simplus full facemask and CPAP was titrated from 5cm to 9 cm.  On the final pressure his AHI was 0.8/h with supine REM sleep achieved an O2 nadir of 92%.  Sleep efficiency was 98.8%, sleep latency 3 minutes, REM latency was 9.5 minutes, REM percentage normal at 22.3%.    We kept his mother up-to-date with all his test results.  Today, 06/11/2020: I  reviewed his CPAP compliance data from 05/11/2020 through 06/09/2020, which is a total of 30 days, during which time he used his CPAP 14 days with percent use days greater than 4 hours at 0%, indicating significantly low compliance with an average usage of 1 hour and 55 minutes, residual AHI 0.6/h, leak on the high side with a 95th percentile at 26 L/min on a pressure of 9 cm with EPR of 3. He reports using his CPAP.  His mom reports that he uses it every night or nearly every night.  However, it is possible that he does not put it on every night and perhaps take it off in the middle of the night as she sleeps in a different bedroom.  She reports that he seems to function better some days than others.  His memory loss is about the same.  She is currently not in favor of seeking more in-depth evaluation with a memory disorders clinic.  The patient's allergies, current medications, family history, past medical history, past social history, past surgical history and problem list were reviewed and updated as appropriate.   Previously:   01/03/20: (He) has had cognitive decline over the past 18 months per mom.  The patient is minimally verbal and not able to provide any of his history.  She adopted him when he was 7.  He has a twin brother who had an abnormality on his brain.  Both the patient and his twin brother had MRIs many years ago, MRI results of any brain MRI or CT had are not available for my review today.  He has had increase in frustration, some mood irritability, no aggression.  He has been noted to become slower in his movements and less vigilant in the past 1 year.  Sometimes he has a staring spell or zones out, no convulsions.  She is not sure if he had an EEG in the past but would like to get an EEG done.  She believes that he would not be able to lay still for an MRI and would like to have it done under anesthesia but also reports that he had complications from anesthesia including apneas when he had  dental surgery under anesthesia and was sent home, and stopped breathing at home.  She did not have to go to the ER with him as he started breathing on his own. He has had sleepiness during the day and his Epworth sleepiness score is high at 22 out of 24, fatigue severity score is 63 out of 63. He snores. I reviewed your office note from 12/26/2019.  He had a sleep study about 4 years ago which was negative for sleep apnea at the time.  I reviewed blood test results which were available through your records: TSH was normal, free T4 normal, B12 385, lipid panel showed LDL of 81, total cholesterol 122, triglycerides 78, HDL 30, vitamin D was low at 12, CMP showed BUN of 11, creatinine of 1.8, alkaline phosphatase elevated at 153, liver enzymes otherwise normal, CBC with differential was unremarkable. He is able to do his own ADLs but takes longer.  It takes him 4 hours to do the dishes.  He plays computer games but seems to have become less quick with games that he has been able to do well before.   His Past Medical History Is Significant For: Past Medical History:  Diagnosis Date  . ADD (attention deficit disorder)   . ADHD (attention deficit hyperactivity disorder)   . Asthma    excercise induced and pollen  . Auditory processing disorder   . Complication of anesthesia    mother states pt. is harder to put under anes. and hard to wake up due to fetal alcohol syndrome; can be combative, per mother; states he will do better if mother is in PACU when he is coming out of anes.  . Development delay    states mental status of a 29 year old  . Exercise-induced asthma    prn inhaler/neb.  . Fetal alcohol syndrome   . Narcolepsy   . Non-restorable tooth 09/2015   teeth  . Separation anxiety   . Twin birth     His Past Surgical History Is Significant For: Past Surgical History:  Procedure Laterality Date  . MULTIPLE EXTRACTIONS WITH ALVEOLOPLASTY N/A 09/23/2015   Procedure: MULTIPLE EXTRACTION WITH  ALVEOLOPLASTY;  Surgeon: Diona Browner, DDS;  Location: [facility];  Service: Oral Surgery;  Laterality: N/A;  . PYLOROMYOTOMY     age [age], performed found to not have stenosis on surgical exam  . PYLOROMYOTOMY     did not have pyloric stenosis; did surgery on the wrong twin  . RADIOLOGY WITH ANESTHESIA N/A 01/16/2020   Procedure: MRI WITH ANESTHESIA  BRAIN WITH AND WITHOUT CONTRAST;  Surgeon: Radiologist, Medication, MD;  Location: [facility];  Service: Radiology;  Laterality: N/A;    Her Family History Is Significant For: Family History  Adopted: Yes  Problem Relation Age of Onset  . Mental illness Mother   . Diabetes Mother   . Hypertension Mother   . Schizophrenia Mother   . Bipolar disorder Mother   . Mental retardation Father     His Social History Is Significant For: Social History   Socioeconomic History  . Marital status: Single    Spouse name: Not on file  . Number of children: Not on file  . Years of education: Not on file  . Highest education level: Not on file  Occupational History  . Not on file  Tobacco Use  . Smoking status: Never Smoker  . Smokeless tobacco: Never Used  Substance and Sexual Activity  . Alcohol use: No  . Drug use: No  . Sexual activity: Not on file  Other Topics Concern  . Not on file  Social History Narrative   ** Merged History Encounter **       Social Determinants of Health   Financial Resource Strain:   . Difficulty of Paying Living Expenses: Not on file  Food Insecurity:   . Worried About Charity fundraiser in the Last Year: Not on file  . Ran Out of Food in the Last Year: Not on file  Transportation Needs:   . Lack of Transportation (Medical): Not on file  . Lack of Transportation (Non-Medical): Not on file  Physical Activity:   . Days of Exercise per Week: Not on file  . Minutes of Exercise per Session: Not on file  Stress:   . Feeling of Stress : Not on file  Social Connections:   . Frequency of  Communication with Friends and Family: Not on file  . Frequency of Social Gatherings with Friends and Family: Not on file  . Attends Religious Services: Not on file  . Active Member of Clubs or Organizations: Not on file  . Attends Archivist Meetings: Not on file  . Marital Status: Not on file    His Allergies Are:  Allergies  Allergen Reactions  . Other     Fetal alcohol syndrome  Certain medications have adverse affect for him. Must have heavy anesthesia meds in order to work.  :   His Current Medications Are:  Outpatient Encounter Medications as of 06/11/2020  Medication Sig  . albuterol (PROVENTIL HFA;VENTOLIN HFA) 108 (90 BASE) MCG/ACT inhaler Inhale into the lungs every 6 (six) hours as needed for wheezing or shortness of breath.  Marland Kitchen albuterol (PROVENTIL) (5 MG/ML) 0.5% nebulizer solution Take 2.5 mg by nebulization every 6 (six) hours as needed for wheezing or shortness of breath.  . Cholecalciferol (VITAMIN D3) 250 MCG (10000 UT) capsule Take 10,000 Units by mouth daily.  Marland Kitchen loratadine (CLARITIN) 10 MG tablet Take 10 mg by mouth daily.  . Multiple Vitamin (MULTIVITAMIN WITH MINERALS) TABS tablet Take 1 tablet by mouth daily.   No facility-administered encounter medications on file as of 06/11/2020.  :  Review of Systems:  Out of a complete 14 point review of systems, all are reviewed and negative with the exception of these symptoms as listed below: Review of Systems  Neurological:       Here for f/u on cpap. Reports when he uses the machine he does feel better. Mom reports pt stays up late at night and sometimes does not use his machine.    Objective:  Neurological Exam  Physical Exam Physical Examination:   Vitals:   06/11/20 0828  BP: 118/82  Pulse: 97  SpO2: 95%    General Examination: The patient is a very pleasant 29 y.o. male in no acute distress. He appears well-developed and well-nourished and well groomed.   HEENT: Normocephalic,  atraumatic, pupils are equal, round and reactive to light, extraocular tracking is mildly impaired, face is symmetric, speech is very scant and slightly slow but not dysarthric.  He has no hypophonia, neck is supple with full range of motion.  Airway examination reveals mild to moderate mouth dryness, tongue protrudes centrally in palate elevates symmetrically.  No carotid bruits.    Chest: Clear to auscultation without wheezing, rhonchi or crackles noted.  Heart: S1+S2+0, regular and normal without murmurs, rubs or gallops noted.   Abdomen: Soft, non-tender and non-distended with normal bowel sounds appreciated on auscultation.  Extremities: There is no pitting edema in the distal lower extremities bilaterally.   Skin: Warm and dry without trophic changes noted.  Musculoskeletal: exam reveals no obvious joint deformities, tenderness or joint swelling or erythema.   Neurologically:  Mental status: The patient is awake, alert and pays attention.  He is able to follow simple commands, he is minimally verbal.  He is unable to give his own history.   (On 01/03/20: MMSE is 21 out of 30, clock drawing shows difficulty with putting the numbers in the right spaces.  Animal fluency test shows 18/min.)    Cranial nerves II - XII are as described above under HEENT exam. In addition: shoulder shrug is normal with equal shoulder height noted. Motor exam: Normal bulk, strength and tone is noted. Some slowness in his movements. There is no tremor. Fine motor skills and coordination: Grossly intact. Cerebellar testing: No dysmetria or intention tremor. There is no truncal or gait ataxia.  Sensory exam: intact to light touch in the upper and lower extremities.  Gait, station and balance: He stands up slowly and walks slowly, no shuffling, preserved arm swing is noted.   Assessment and Plan:  In summary, PACO CISLO is a very pleasant 72.-year old male with an underlying medical history of  allergies, fetal alcohol syndrome, with developmental delays, ADHD, vitamin D deficiency, asthma, and obesity, who presents for follow-up consultation of his cognitive decline.  MRI and EEG recently were benign.  Sleep study testing showed obstructive sleep apnea.  He is not fully compliant with his CPAP currently.  I explained that sometimes there is a discrepancy between the usage and the remote data available.  Sometimes it has to do with the Wi-Fi connection but patient's mom reports that her husband also has a CPAP machine and his usage seems fine when remote data is accessed.  We mutually agreed to try to encourage the patient to use his CPAP more consistently and she will keep an eye on it.  We agreed to review his compliance data in about a month, she will call to remind [place] to look at the remote data.  She declines a referral to a memory disorders clinic.  We talked about his other test results.  She is advised to make a follow-up appointment to see one of our nurse practitioners in 3 to 4 months but she reports that she will have to call back after looking at her schedule as she has several other special needs children that need attention at this time.  She is agreeable to reviewing his compliance data in a month and she will call to make a follow-up appointment for him in about 3 to 4 months.  I answered all  their questions today and the patient and his mother were in agreement.  I spent 30 minutes in total face-to-face time and in reviewing records during pre-charting, more than 50% of which was spent in counseling and coordination of care, reviewing test results, reviewing medications and treatment regimen and/or in discussing or reviewing the diagnosis of OSA, memory loss, the prognosis and treatment options. Pertinent laboratory and imaging test results that were available during this visit with the patient were reviewed by me and considered in my medical decision making (see chart for details).

## 2020-06-13 ENCOUNTER — Encounter: Payer: Self-pay | Admitting: Neurology

## 2020-06-13 ENCOUNTER — Telehealth: Payer: Self-pay

## 2020-06-13 DIAGNOSIS — Q860 Fetal alcohol syndrome (dysmorphic): Secondary | ICD-10-CM

## 2020-06-13 DIAGNOSIS — R413 Other amnesia: Secondary | ICD-10-CM

## 2020-06-13 NOTE — Telephone Encounter (Signed)
Pt's mom messaged back and is now agreeable to the memory clinic referral as discussed in most recent appointment.

## 2020-06-13 NOTE — Telephone Encounter (Signed)
Referral placed to [facility] memory disorders clinic

## 2020-06-14 ENCOUNTER — Encounter: Payer: Self-pay | Admitting: Neurology

## 2020-06-19 NOTE — Telephone Encounter (Signed)
Referral has been sent.

## 2020-06-25 ENCOUNTER — Encounter: Payer: Self-pay | Admitting: Neurology

## 2020-07-19 ENCOUNTER — Encounter: Payer: Self-pay | Admitting: Neurology

## 2020-09-12 IMAGING — MR MR HEAD WO/W CM
5 of 14 series · 13 of 48 positions shown · IV contrast (gadavist)
Comparison: None.

CLINICAL DATA: 28-year-old male with history of developmental
delay, fetal alcohol syndrome. Cognitive decline over the past 18
months. Some staring spells, but no convulsions.

EXAM:
MRI HEAD WITHOUT AND WITH CONTRAST
TECHNIQUE: Multiplanar, multiecho pulse sequences of the brain and surrounding
structures were obtained without and with intravenous contrast.
CONTRAST:  10mL GADAVIST GADOBUTROL 1 MMOL/ML IV SOLN

[Series 2: DWI · axial · 3.0mm · 0.94mm/px · z∈[-109,+37]mm · 6 of 100 slices shown (1 of 2)]
[im 1/100]
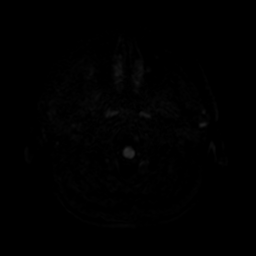
[im 20/100]
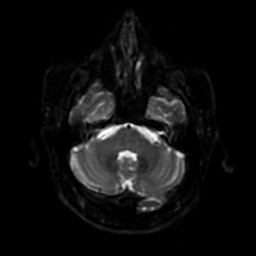
[im 40/100]
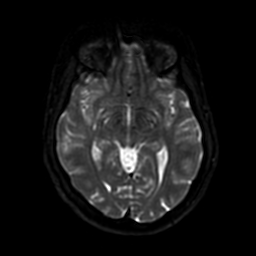
[im 60/100]
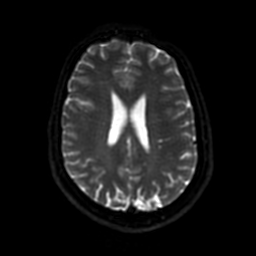
[im 80/100]
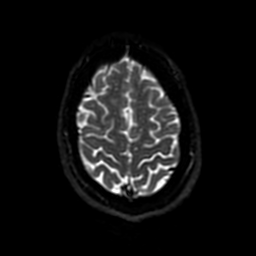
[im 100/100]
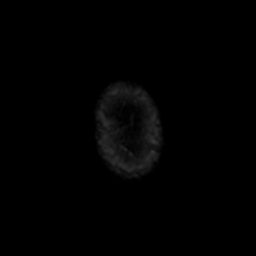

[Series 3: FLAIR · sagittal · 5.0mm · 0.23mm/px · 2 of 25 slices shown (1 of 2)]
[im 1/25]
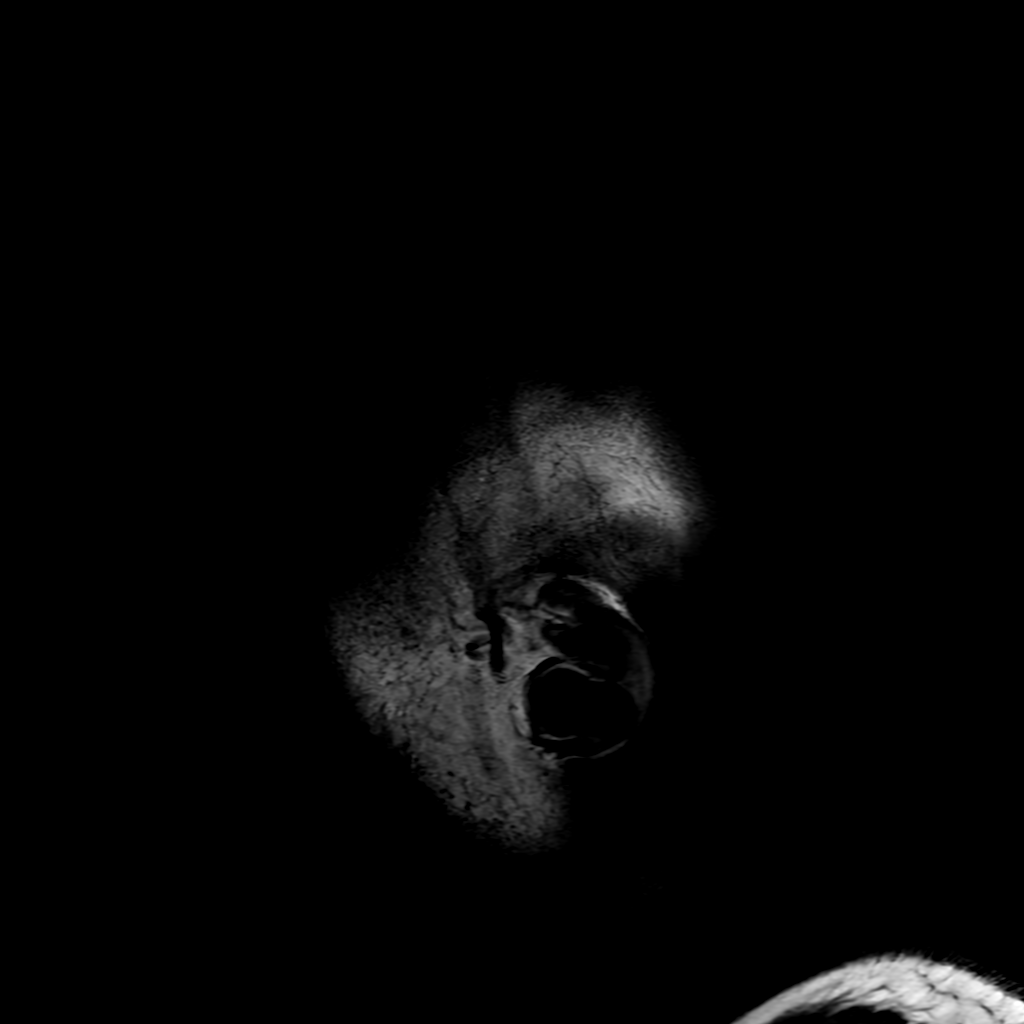
[im 25/25]
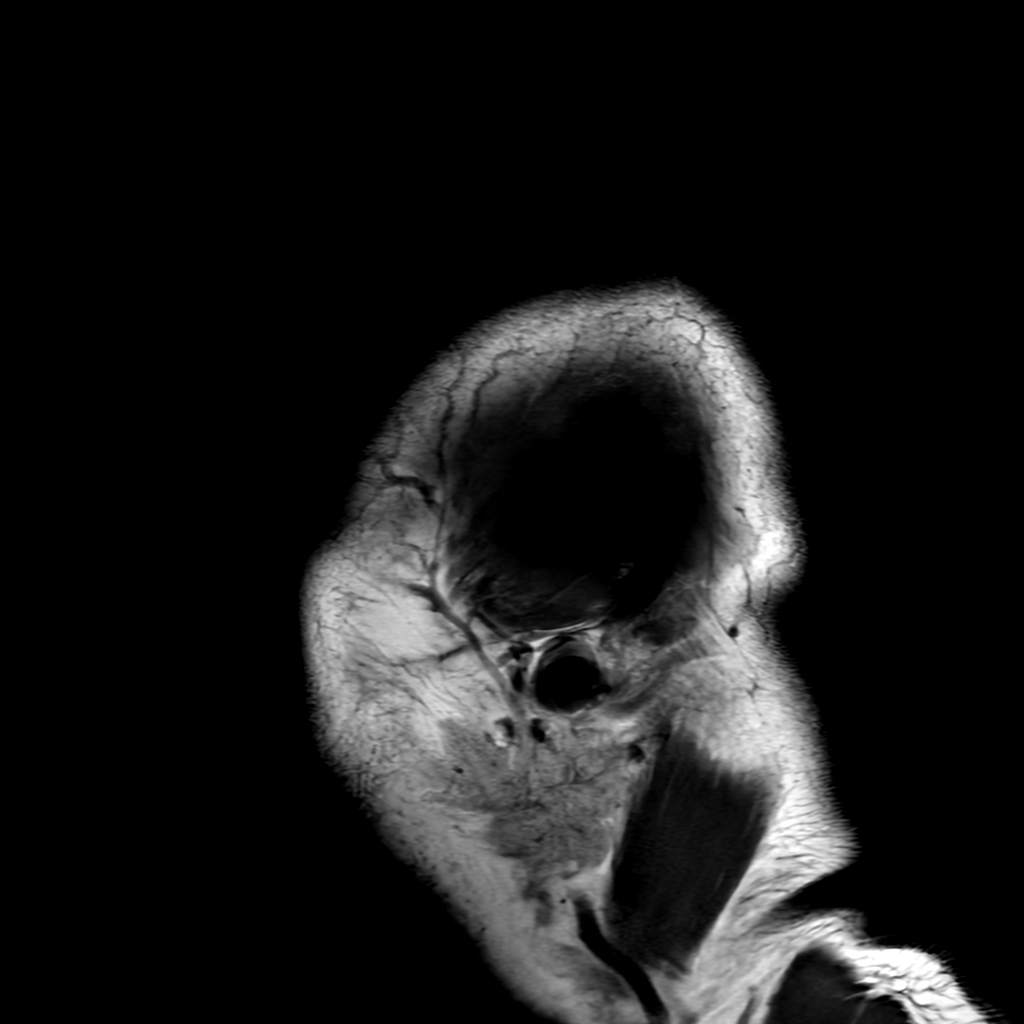

[Series 4: DWI · coronal · 4.0mm · 0.94mm/px · 1 of 74 slices shown (2 of 2)]
[im 1/74]
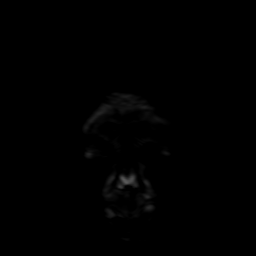

[Series 6: FLAIR · axial · 3.0mm · 0.45mm/px · z∈[-98,+46]mm · 2 of 25 slices shown (2 of 2)]
[im 1/25]
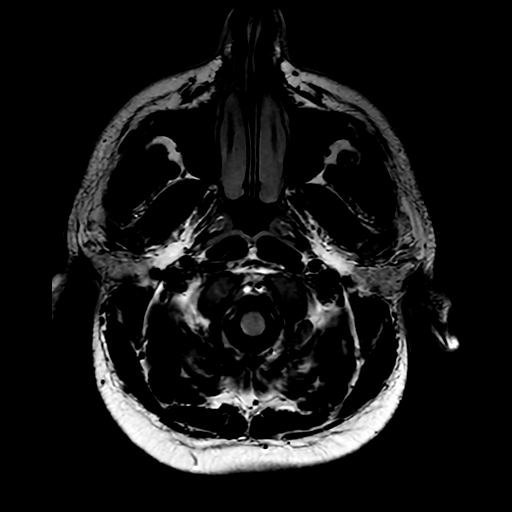
[im 25/25]
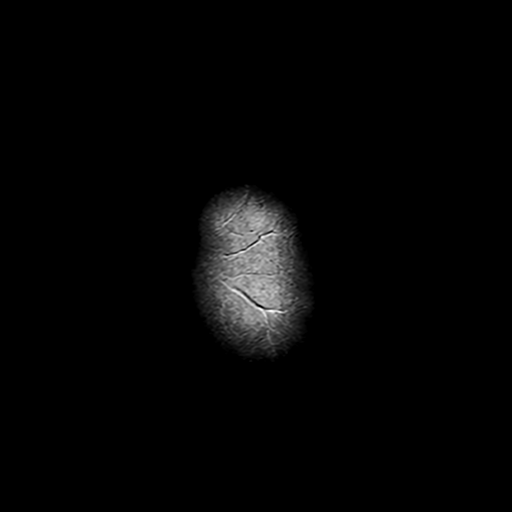

[Series 10: FLAIR post-contrast · sagittal · 5.0mm · 0.23mm/px · 2 of 25 slices shown]
[im 1/25]
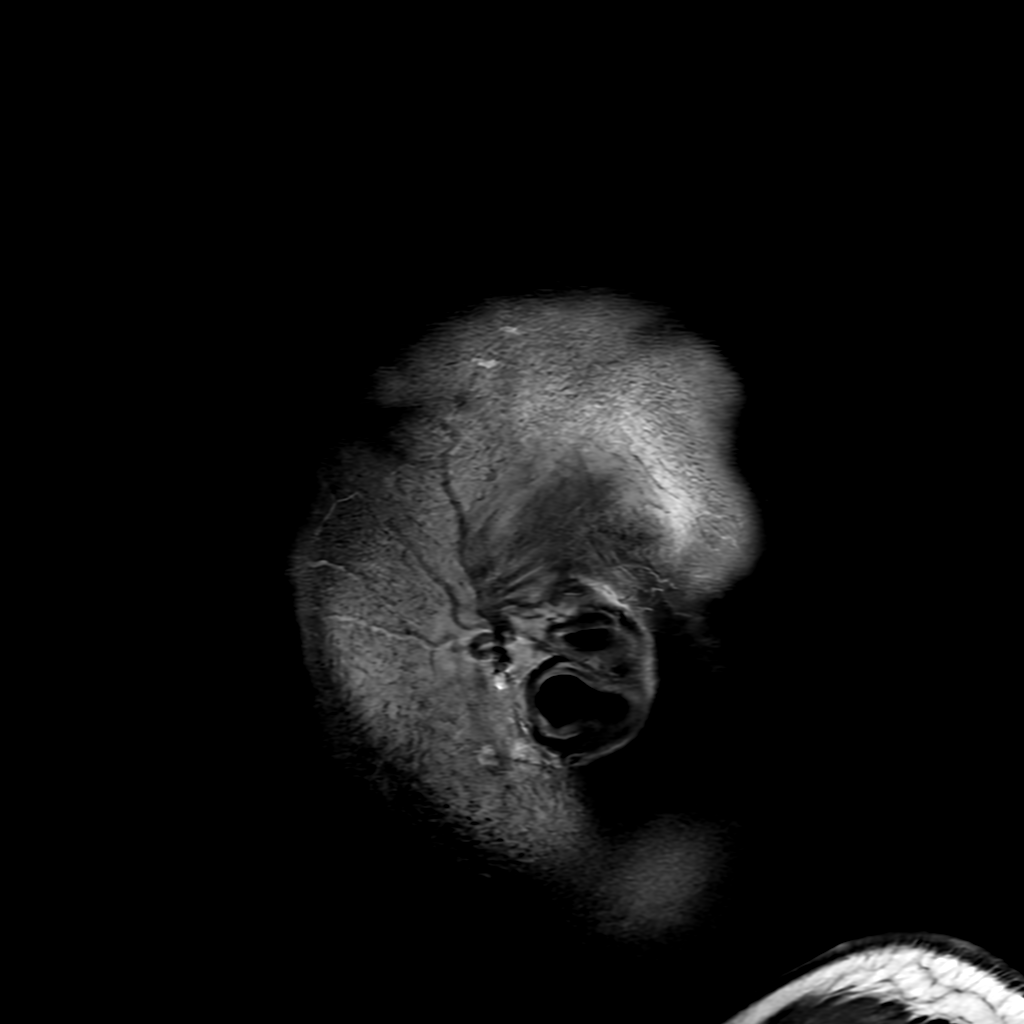
[im 25/25]
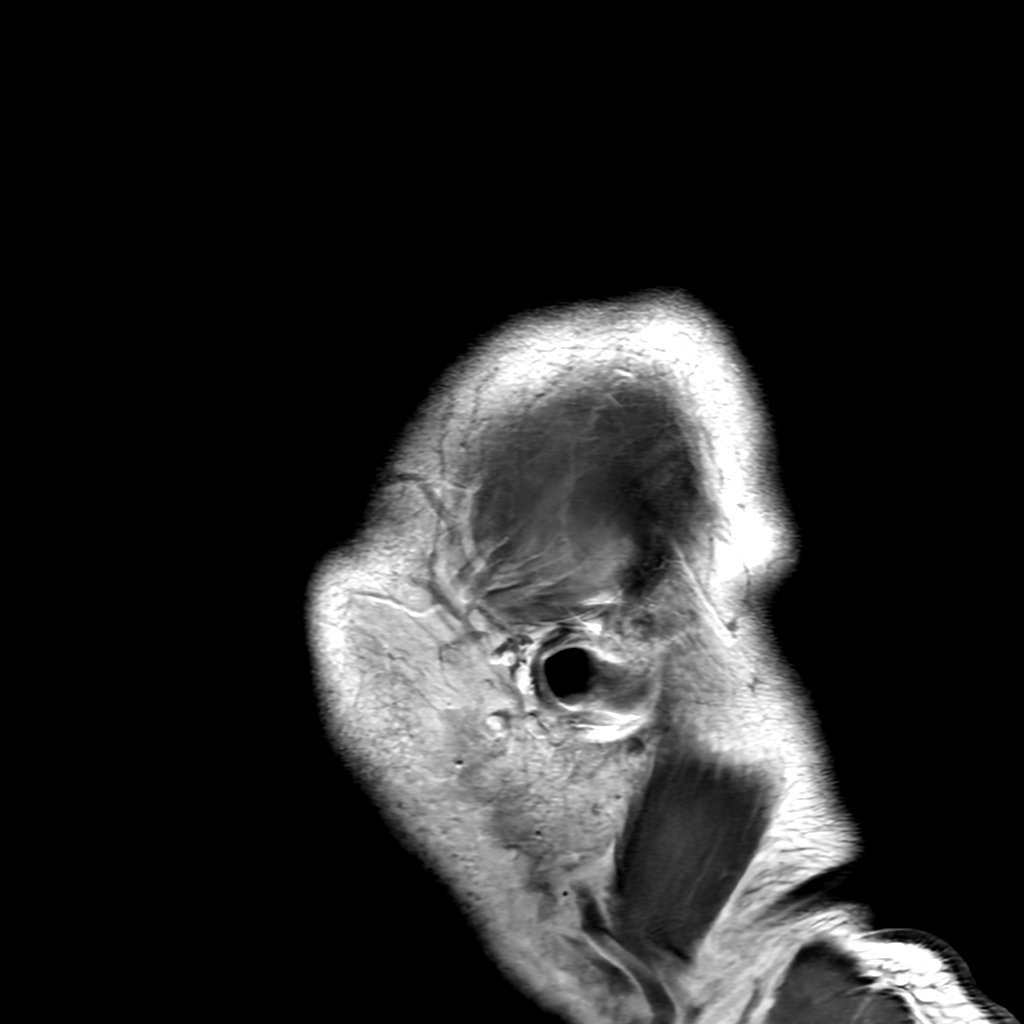

[13 of 48 positions shown; findings below may reference images not displayed]

FINDINGS: Brain: Midline structures are normally formed. Cerebral volume
appears relatively normal.

No restricted diffusion to suggest acute infarction. No midline
shift, mass effect, evidence of mass lesion, ventriculomegaly,
extra-axial collection or acute intracranial hemorrhage.
Cervicomedullary junction and pituitary are within normal limits.

Incidental broad-based dural calcification at the vertex on the
right. Gray and white matter signal is within normal limits. No
cortical encephalomalacia or chronic cerebral blood products
identified. Cerebral morphology appears within normal limits.

No abnormal enhancement identified. No dural thickening.

Vascular: Major intracranial vascular flow voids are preserved. The
major dural venous sinuses are enhancing and appear to be patent.

Skull and upper cervical spine: Negative visible cervical spine,
bone marrow signal.

Sinuses/Orbits: Negative orbits. Minimal left ethmoid and maxillary
sinus mucosal thickening or small retention cysts.

Other: The patient is intubated, study was performed under general
anesthesia. Mastoids are clear. Visible internal auditory structures
appear normal. Scalp and face soft tissues appear negative.
IMPRESSION: No acute intracranial abnormality, and unremarkable MRI appearance
of the brain.

## 2021-07-17 ENCOUNTER — Ambulatory Visit: Payer: Medicare Other | Admitting: Neurology

## 2021-12-01 ENCOUNTER — Encounter: Payer: Self-pay | Admitting: *Deleted

## 2022-05-18 ENCOUNTER — Ambulatory Visit: Payer: Medicare Other | Admitting: Adult Health

## 2022-05-20 ENCOUNTER — Ambulatory Visit: Payer: Medicare Other | Admitting: Adult Health

## 2022-07-01 ENCOUNTER — Emergency Department (HOSPITAL_BASED_OUTPATIENT_CLINIC_OR_DEPARTMENT_OTHER): Payer: Medicare Other

## 2022-07-01 ENCOUNTER — Encounter (HOSPITAL_COMMUNITY): Payer: Self-pay

## 2022-07-01 ENCOUNTER — Other Ambulatory Visit: Payer: Self-pay

## 2022-07-01 ENCOUNTER — Encounter (HOSPITAL_BASED_OUTPATIENT_CLINIC_OR_DEPARTMENT_OTHER): Payer: Self-pay

## 2022-07-01 ENCOUNTER — Emergency Department (HOSPITAL_BASED_OUTPATIENT_CLINIC_OR_DEPARTMENT_OTHER): Payer: Medicare Other | Admitting: Radiology

## 2022-07-01 ENCOUNTER — Inpatient Hospital Stay (HOSPITAL_BASED_OUTPATIENT_CLINIC_OR_DEPARTMENT_OTHER)
Admission: EM | Admit: 2022-07-01 | Discharge: 2022-07-03 | DRG: 821 | Disposition: A | Discharge status: Home / Self Care | Payer: Medicare Other | Attending: Internal Medicine | Admitting: Internal Medicine

## 2022-07-01 DIAGNOSIS — E669 Obesity, unspecified: Secondary | ICD-10-CM | POA: Diagnosis present

## 2022-07-01 DIAGNOSIS — J45901 Unspecified asthma with (acute) exacerbation: Secondary | ICD-10-CM | POA: Diagnosis present

## 2022-07-01 DIAGNOSIS — R6259 Other lack of expected normal physiological development in childhood: Secondary | ICD-10-CM | POA: Diagnosis present

## 2022-07-01 DIAGNOSIS — Z810 Family history of intellectual disabilities: Secondary | ICD-10-CM | POA: Diagnosis not present

## 2022-07-01 DIAGNOSIS — Z818 Family history of other mental and behavioral disorders: Secondary | ICD-10-CM | POA: Diagnosis not present

## 2022-07-01 DIAGNOSIS — Q860 Fetal alcohol syndrome (dysmorphic): Secondary | ICD-10-CM | POA: Diagnosis not present

## 2022-07-01 DIAGNOSIS — Z20822 Contact with and (suspected) exposure to covid-19: Secondary | ICD-10-CM | POA: Diagnosis present

## 2022-07-01 DIAGNOSIS — J9859 Other diseases of mediastinum, not elsewhere classified: Secondary | ICD-10-CM | POA: Diagnosis present

## 2022-07-01 DIAGNOSIS — C8112 Nodular sclerosis classical Hodgkin lymphoma, intrathoracic lymph nodes: Secondary | ICD-10-CM | POA: Diagnosis present

## 2022-07-01 DIAGNOSIS — Z833 Family history of diabetes mellitus: Secondary | ICD-10-CM

## 2022-07-01 DIAGNOSIS — Z79899 Other long term (current) drug therapy: Secondary | ICD-10-CM | POA: Diagnosis not present

## 2022-07-01 DIAGNOSIS — Z8249 Family history of ischemic heart disease and other diseases of the circulatory system: Secondary | ICD-10-CM

## 2022-07-01 DIAGNOSIS — Z6839 Body mass index (BMI) 39.0-39.9, adult: Secondary | ICD-10-CM

## 2022-07-01 DIAGNOSIS — R590 Localized enlarged lymph nodes: Secondary | ICD-10-CM | POA: Diagnosis present

## 2022-07-01 DIAGNOSIS — F909 Attention-deficit hyperactivity disorder, unspecified type: Secondary | ICD-10-CM | POA: Diagnosis present

## 2022-07-01 LAB — CBC WITH DIFFERENTIAL/PLATELET
Abs Immature Granulocytes: 0.02 10*3/uL (ref 0.00–0.07)
Basophils Absolute: 0.1 10*3/uL (ref 0.0–0.1)
Basophils Relative: 1 %
Eosinophils Absolute: 0.7 10*3/uL — ABNORMAL HIGH (ref 0.0–0.5)
Eosinophils Relative: 11 %
HCT: 41.6 % (ref 39.0–52.0)
Hemoglobin: 14.4 g/dL (ref 13.0–17.0)
Immature Granulocytes: 0 %
Lymphocytes Relative: 26 %
Lymphs Abs: 1.7 10*3/uL (ref 0.7–4.0)
MCH: 30.5 pg (ref 26.0–34.0)
MCHC: 34.6 g/dL (ref 30.0–36.0)
MCV: 88.1 fL (ref 80.0–100.0)
Monocytes Absolute: 0.6 10*3/uL (ref 0.1–1.0)
Monocytes Relative: 9 %
Neutro Abs: 3.5 10*3/uL (ref 1.7–7.7)
Neutrophils Relative %: 53 %
Platelets: 266 10*3/uL (ref 150–400)
RBC: 4.72 MIL/uL (ref 4.22–5.81)
RDW: 12.8 % (ref 11.5–15.5)
WBC: 6.6 10*3/uL (ref 4.0–10.5)
nRBC: 0 % (ref 0.0–0.2)

## 2022-07-01 LAB — COMPREHENSIVE METABOLIC PANEL
ALT: 31 U/L (ref 0–44)
AST: 22 U/L (ref 15–41)
Albumin: 4.6 g/dL (ref 3.5–5.0)
Alkaline Phosphatase: 113 U/L (ref 38–126)
Anion gap: 8 (ref 5–15)
BUN: 11 mg/dL (ref 6–20)
CO2: 28 mmol/L (ref 22–32)
Calcium: 9.6 mg/dL (ref 8.9–10.3)
Chloride: 102 mmol/L (ref 98–111)
Creatinine, Ser: 0.94 mg/dL (ref 0.61–1.24)
GFR, Estimated: >60 mL/min (ref >60)
Glucose, Bld: 100 mg/dL — ABNORMAL HIGH (ref 70–99)
Potassium: 3.9 mmol/L (ref 3.5–5.1)
Sodium: 138 mmol/L (ref 135–145)
Total Bilirubin: 0.5 mg/dL (ref 0.3–1.2)
Total Protein: 7.5 g/dL (ref 6.5–8.1)

## 2022-07-01 LAB — RESP PANEL BY RT-PCR (FLU A&B, COVID) ARPGX2
Influenza A by PCR: NEGATIVE
Influenza B by PCR: NEGATIVE
SARS Coronavirus 2 by RT PCR: NEGATIVE

## 2022-07-01 LAB — TSH: TSH: 1.353 u[IU]/mL (ref 0.350–4.500)

## 2022-07-01 LAB — T4, FREE: Free T4: 0.82 ng/dL (ref 0.61–1.12)

## 2022-07-01 LAB — TROPONIN I (HIGH SENSITIVITY)
Troponin I (High Sensitivity): <2 ng/L (ref <18)
Troponin I (High Sensitivity): <2 ng/L (ref <18)

## 2022-07-01 LAB — MAGNESIUM: Magnesium: 2.2 mg/dL (ref 1.7–2.4)

## 2022-07-01 MED ORDER — IPRATROPIUM-ALBUTEROL 0.5-2.5 (3) MG/3ML IN SOLN
3.0000 mL | Freq: Once | RESPIRATORY_TRACT | Status: AC
  Administered 2022-07-01: 3 mL via RESPIRATORY_TRACT
  Filled 2022-07-01: qty 3

## 2022-07-01 MED ORDER — ALBUTEROL SULFATE HFA 108 (90 BASE) MCG/ACT IN AERS: 1.0000 | INHALATION_SPRAY | Freq: Four times a day (QID) | RESPIRATORY_TRACT | Status: DC | PRN

## 2022-07-01 MED ORDER — IPRATROPIUM-ALBUTEROL 0.5-2.5 (3) MG/3ML IN SOLN
3.0000 mL | Freq: Three times a day (TID) | RESPIRATORY_TRACT | Status: DC
  Administered 2022-07-01 – 2022-07-03 (×4): 3 mL via RESPIRATORY_TRACT
  Filled 2022-07-01 (×4): qty 3

## 2022-07-01 MED ORDER — ONDANSETRON HCL 4 MG PO TABS: 4.0000 mg | ORAL_TABLET | Freq: Four times a day (QID) | ORAL | Status: DC | PRN

## 2022-07-01 MED ORDER — IOHEXOL 300 MG/ML  SOLN
100.0000 mL | Freq: Once | INTRAMUSCULAR | Status: AC | PRN
  Administered 2022-07-01: 75 mL via INTRAVENOUS

## 2022-07-01 MED ORDER — ALBUTEROL SULFATE (5 MG/ML) 0.5% IN NEBU: 2.5000 mg | INHALATION_SOLUTION | Freq: Four times a day (QID) | RESPIRATORY_TRACT | Status: DC | PRN

## 2022-07-01 MED ORDER — MOMETASONE FURO-FORMOTEROL FUM 200-5 MCG/ACT IN AERO
2.0000 | INHALATION_SPRAY | Freq: Two times a day (BID) | RESPIRATORY_TRACT | Status: DC
  Administered 2022-07-02 – 2022-07-03 (×3): 2 via RESPIRATORY_TRACT
  Filled 2022-07-01: qty 8.8

## 2022-07-01 MED ORDER — ACETAMINOPHEN 325 MG PO TABS: 650.0000 mg | ORAL_TABLET | Freq: Four times a day (QID) | ORAL | Status: DC | PRN

## 2022-07-01 MED ORDER — ACETAMINOPHEN 650 MG RE SUPP: 650.0000 mg | Freq: Four times a day (QID) | RECTAL | Status: DC | PRN

## 2022-07-01 MED ORDER — ONDANSETRON HCL 4 MG/2ML IJ SOLN: 4.0000 mg | Freq: Four times a day (QID) | INTRAMUSCULAR | Status: DC | PRN

## 2022-07-01 MED ORDER — ALBUTEROL SULFATE (2.5 MG/3ML) 0.083% IN NEBU
2.5000 mg | INHALATION_SOLUTION | RESPIRATORY_TRACT | Status: DC | PRN
  Administered 2022-07-02 – 2022-07-03 (×2): 2.5 mg via RESPIRATORY_TRACT
  Filled 2022-07-01 (×2): qty 3

## 2022-07-01 NOTE — Assessment & Plan Note (Signed)
Presumably neoplastic, DDx broad: lymphoma preferred by radiologist given lymphadenopathy (see CT report). 1. Needs biopsy as next step, pulm said to consult IR for biopsy. 2. IR consult put into Epic 1. NPO after MN 2. SCDs for DVT ppx

## 2022-07-01 NOTE — Assessment & Plan Note (Addendum)
1. PRN SABA 2. Scheduled LABA + INH steroid 3. Will hold off on systemic steroids for the moment

## 2022-07-01 NOTE — H&P (Signed)
History and Physical    Patient: Paul [name] DOB: 04-Dec-1990 DOA: 07/01/2022 DOS: the patient was seen and examined on 07/01/2022 PCP: Percell Belt, DO  Patient coming from: Home  Chief Complaint:  Chief Complaint  Patient presents with   Shortness of Breath   HPI: Paul [name] is a 30 y.o. male with medical history significant of asthma, ADHD, fetal alcohol syndrome, developmental delay.  In to ED with 2 week h/o SOB, wheezing.  Got breathing treatments, but was still tachypnic and tachycardic.  CT chest shows extensive mediastinal and hilar adenopathy with possible soft tissue mass within anterior mediastinum suspicious for primary cancer, metastatic disease or lymphoma.  ED physician already spoke to oncology who recommended biopsy.  They already spoke to pulmonary and they had also recommended biopsy but said that IR can be consulted for that.  EDP felt given developmental delay / special needs, that pt should be admitted for additional work up.  Review of Systems: As mentioned in the history of present illness. All other systems reviewed and are negative. Past Medical History:  Diagnosis Date   ADD (attention deficit disorder)    ADHD (attention deficit hyperactivity disorder)    Asthma    excercise induced and pollen   Auditory processing disorder    Complication of anesthesia    mother states pt. is harder to put under anes. and hard to wake up due to fetal alcohol syndrome; can be combative, per mother; states he will do better if mother is in PACU when he is coming out of anes.   Development delay    states mental status of a 31 year old   Exercise-induced asthma    prn inhaler/neb.   Fetal alcohol syndrome    Narcolepsy    Non-restorable tooth 09/2015   teeth   Separation anxiety    Twin birth    Past Surgical History:  Procedure Laterality Date   MULTIPLE EXTRACTIONS WITH ALVEOLOPLASTY N/A 09/23/2015   Procedure: MULTIPLE EXTRACTION  WITH ALVEOLOPLASTY;  Surgeon: Diona Browner, DDS;  Location: [place];  Service: Oral Surgery;  Laterality: N/A;   PYLOROMYOTOMY     age [age], performed found to not have stenosis on surgical exam   PYLOROMYOTOMY     did not have pyloric stenosis; did surgery on the wrong twin   RADIOLOGY WITH ANESTHESIA N/A 01/16/2020   Procedure: MRI WITH ANESTHESIA  BRAIN WITH AND WITHOUT CONTRAST;  Surgeon: Radiologist, Medication, MD;  Location: [place];  Service: Radiology;  Laterality: N/A;   Social History:  reports that he has never smoked. He has never used smokeless tobacco. He reports that he does not drink alcohol and does not use drugs.  Allergies  Allergen Reactions   Other     Fetal alcohol syndrome  Certain medications have adverse affect for him. Must have heavy anesthesia meds in order to work.    Family History  Adopted: Yes  Problem Relation Age of Onset   Mental illness Mother    Diabetes Mother    Hypertension Mother    Schizophrenia Mother    Bipolar disorder Mother    Mental retardation Father     Prior to Admission medications   Medication Sig Start Date End Date Taking? Authorizing Provider  albuterol (PROVENTIL HFA;VENTOLIN HFA) 108 (90 BASE) MCG/ACT inhaler Inhale into the lungs every 6 (six) hours as needed for wheezing or shortness of breath.    [provider]  albuterol (PROVENTIL) (5 MG/ML) 0.5% nebulizer  solution Take 2.5 mg by nebulization every 6 (six) hours as needed for wheezing or shortness of breath.    [provider]  Cholecalciferol (VITAMIN D3) 250 MCG (10000 UT) capsule Take 10,000 Units by mouth daily.    [provider]  loratadine (CLARITIN) 10 MG tablet Take 10 mg by mouth daily.    [provider]  Multiple Vitamin (MULTIVITAMIN WITH MINERALS) TABS tablet Take 1 tablet by mouth daily.    [provider]    Physical Exam: Vitals:   07/01/22 1954 07/01/22 2030 07/01/22 2045 07/01/22 2148   BP:   101/67 110/77  Pulse:   (!) 120 (!) 109  Resp:   19 20  Temp:  97.8 F (36.6 C)  98 F (36.7 C)  TempSrc:  Oral  Oral  SpO2: 96%  96% 93%  Weight:      Height:       Constitutional: NAD, calm, comfortable Eyes: PERRL, lids and conjunctivae normal ENMT: Mucous membranes are moist. Posterior pharynx clear of any exudate or lesions.Normal dentition.  Neck: normal, supple, no masses, no thyromegaly Respiratory: Wheezing and rhonchi present Cardiovascular: Regular rate and rhythm, no murmurs / rubs / gallops. No extremity edema. 2+ pedal pulses. No carotid bruits.  Abdomen: no tenderness, no masses palpated. No hepatosplenomegaly. Bowel sounds positive.  Musculoskeletal: no clubbing / cyanosis. No joint deformity upper and lower extremities. Good ROM, no contractures. Normal muscle tone.  Skin: no rashes, lesions, ulcers. No induration Neurologic: CN 2-12 grossly intact. Sensation intact, DTR normal. Strength 5/5 in all 4.  Psychiatric: Normal judgment and insight. Alert and oriented x 3. Normal mood.   Data Reviewed:     COVID and flu negative   CT chest: IMPRESSION: Extensive mediastinal and to lesser degree hilar adenopathy with conglomerate soft tissue mass/adenopathy within anterior mediastinum measuring 11.2 x 7.9 x 13 cm.   Differential diagnosis includes lymphoma, metastatic disease, Castleman disease, reactive process considered less likely.   Other processes such as thymoma, teratoma, and thyroid tissue/tumor can involve the anterior mediastinum though these are considered less likely and lymphadenopathy is preferred.   Consider either PET-CT imaging or tissue diagnosis.   Compression of LEFT brachiocephalic vein and SVC within the mediastinum though the vascular structures remain patent.  Assessment and Plan: * Mediastinal mass Presumably neoplastic, DDx broad: lymphoma preferred by radiologist given lymphadenopathy (see CT report). Needs biopsy as next  step, pulm said to consult IR for biopsy. IR consult put into Epic NPO after MN SCDs for DVT ppx  Asthma, chronic, unspecified asthma severity, with acute exacerbation PRN SABA Scheduled LABA + INH steroid Will hold off on systemic steroids for the moment      Advance Care Planning:   Code Status: Full Code  Consults: IR consult put into Epic  Family Communication: No family in room, sounds like father is guardian / caregiver.  Severity of Illness: The appropriate patient status for this patient is INPATIENT. Inpatient status is judged to be reasonable and necessary in order to provide the required intensity of service to ensure the patient's safety. The patient's presenting symptoms, physical exam findings, and initial radiographic and laboratory data in the context of their chronic comorbidities is felt to place them at high risk for further clinical deterioration. Furthermore, it is not anticipated that the patient will be medically stable for discharge from the hospital within 2 midnights of admission.   * I certify that at the point of admission it is my clinical  judgment that the patient will require inpatient hospital care spanning beyond 2 midnights from the point of admission due to high intensity of service, high risk for further deterioration and high frequency of surveillance required.*  Author: Etta Quill., DO 07/01/2022 10:45 PM  For on call review [email or web address].

## 2022-07-01 NOTE — ED Triage Notes (Signed)
Patient here POV from Home.  Endorses SOB that has been present for 1-2 Weeks. Worsened Last PM. Has been using Inhaler and Breathing Treatments at Home with Some Relief.   NAD Noted during Triage. A&Ox4. GCS 15. Ambulatory.

## 2022-07-01 NOTE — Plan of Care (Signed)
This is a 31 year old gentleman with no past medical history came to the emergency department with a 2-week history of shortness of breath.  Was audibly wheezy.  Received some breathing treatments.  Remains tachypneic and tachycardic.  CT chest shows extensive mediastinal and hilar adenopathy with possible soft tissue mass within anterior mediastinum suspicious for primary cancer, metastatic disease or lymphoma.  Hospital service has been requested to accept the admission.  ED physician already spoke to oncology who recommended biopsy.  They already spoke to pulmonary and they had also recommended biopsy but said that IR can be consulted for that.  IR will need to be consulted when patient arrives at [facility].  Patient is excepted in telemetry medical unit under [place].  EDP to remain responsible for all patient care until patient arrives to [facility].  [facility] will then assume care.

## 2022-07-01 NOTE — Assessment & Plan Note (Signed)
Pt with chronic developmental delay.  Lives with father who is guardian / caregiver.

## 2022-07-01 NOTE — ED Provider Notes (Signed)
[facility] EMERGENCY DEPT Provider Note   CSN: [id number] Arrival date & time: 07/01/22  1240     History  Chief Complaint  Patient presents with   Shortness of Breath    Paul [name] is a 31 y.o. male.   Shortness of Breath    Patient with medical history of asthma, auditory processing disorder, fetal alcohol syndrome, presents today due to shortness of breath and cough x1 to 2 weeks.  Been using breathing treatments at home with no improvement.  Denies any hemoptysis, chest pain, lower extremity swelling.  Exertion makes the shortness of breath worse.  Denies any recent travel, surgeries, history of PEs, cigarette dependence, malignancy history.  Patient's father is bedside providing additional history.  Home Medications Prior to Admission medications   Medication Sig Start Date End Date Taking? Authorizing Provider  albuterol (PROVENTIL HFA;VENTOLIN HFA) 108 (90 BASE) MCG/ACT inhaler Inhale into the lungs every 6 (six) hours as needed for wheezing or shortness of breath.    [provider]  albuterol (PROVENTIL) (5 MG/ML) 0.5% nebulizer solution Take 2.5 mg by nebulization every 6 (six) hours as needed for wheezing or shortness of breath.    [provider]  Cholecalciferol (VITAMIN D3) 250 MCG (10000 UT) capsule Take 10,000 Units by mouth daily.    [provider]  loratadine (CLARITIN) 10 MG tablet Take 10 mg by mouth daily.    [provider]  Multiple Vitamin (MULTIVITAMIN WITH MINERALS) TABS tablet Take 1 tablet by mouth daily.    [provider]      Allergies    Other    Review of Systems   Review of Systems  Respiratory:  Positive for shortness of breath.     Physical Exam Updated Vital Signs BP 101/67   Pulse (!) 120   Temp 97.8 F (36.6 C) (Oral)   Resp 19   Ht '5\' 8"'$  (1.727 m)   Wt 116.6 kg   SpO2 96%   BMI 39.09 kg/m  Physical Exam Vitals and nursing note reviewed. Exam conducted with  a chaperone present.  Constitutional:      Appearance: Normal appearance.  HENT:     Head: Normocephalic and atraumatic.  Eyes:     General: No scleral icterus.       Right eye: No discharge.        Left eye: No discharge.     Extraocular Movements: Extraocular movements intact.     Pupils: Pupils are equal, round, and reactive to light.  Cardiovascular:     Rate and Rhythm: Normal rate and regular rhythm.     Pulses: Normal pulses.     Heart sounds: Normal heart sounds. No murmur heard.    No friction rub. No gallop.  Pulmonary:     Effort: Pulmonary effort is normal. Tachypnea present. No respiratory distress.     Breath sounds: Wheezing and rhonchi present.  Abdominal:     General: Abdomen is flat. Bowel sounds are normal. There is no distension.     Palpations: Abdomen is soft.     Tenderness: There is no abdominal tenderness.  Musculoskeletal:     Right lower leg: No edema.     Left lower leg: No edema.  Skin:    General: Skin is warm and dry.     Coloration: Skin is not jaundiced.  Neurological:     Mental Status: He is alert. Mental status is at baseline.     Coordination: Coordination normal.  ED Results / Procedures / Treatments   Labs (all labs ordered are listed, but only abnormal results are displayed) Labs Reviewed  CBC WITH DIFFERENTIAL/PLATELET - Abnormal; Notable for the following components:      Result Value   Eosinophils Absolute 0.7 (*)    All other components within normal limits  COMPREHENSIVE METABOLIC PANEL - Abnormal; Notable for the following components:   Glucose, Bld 100 (*)    All other components within normal limits  RESP PANEL BY RT-PCR (FLU A&B, COVID) ARPGX2  TSH  T4, FREE  MAGNESIUM  TROPONIN I (HIGH SENSITIVITY)  TROPONIN I (HIGH SENSITIVITY)    EKG EKG Interpretation  Date/Time:  Wednesday July 01 2022 12:52:10 EDT Ventricular Rate:  123 PR Interval:  138 QRS Duration: 84 QT Interval:  290 QTC Calculation: 415 R  Axis:   93 Text Interpretation: Sinus tachycardia Possible Left atrial enlargement Rightward axis Borderline ECG No previous ECGs available Confirmed by Sherwood Gambler [phone]) on 07/01/2022 4:19:53 PM  Radiology CT Chest W Contrast  Result Date: 07/01/2022 CLINICAL DATA:  Abnormal chest radiograph, anterior mediastinal mass EXAM: CT CHEST WITH CONTRAST TECHNIQUE: Multidetector CT imaging of the chest was performed during intravenous contrast administration. RADIATION DOSE REDUCTION: This exam was performed according to the departmental dose-optimization program which includes automated exposure control, adjustment of the mA and/or kV according to patient size and/or use of iterative reconstruction technique. CONTRAST:  61m OMNIPAQUE IOHEXOL 300 MG/ML  SOLN IV COMPARISON:  Chest radiograph 07/01/2022 FINDINGS: Cardiovascular: Vascular structures patent. Compression of LEFT brachiocephalic vein and SVC within the mediastinum. Aorta normal caliber. Heart size normal. No pericardial effusion. Mediastinum/Nodes: Esophagus unremarkable. Base of cervical region normal appearance. Axilla clear bilaterally. Extensive mediastinal and to lesser degree hilar adenopathy. Conglomerate soft tissue mass/adenopathy within anterior mediastinum measuring 11.2 cm transverse, 7.9 cm AP, and 13 cm length. Additional adenopathy at AP window where a 27 mm short axis node is seen. LEFT retro hilar node 25 mm. RIGHT hilar node 21 mm. RIGHT paratracheal node 20 mm. None of the observed nodes are calcified or grossly necrotic. Lungs/Pleura: Lungs clear. No pulmonary infiltrate, pleural effusion, or pneumothorax. Upper Abdomen: Respiratory motion artifacts significantly degrade upper abdomen assessment. Probable cyst posteriorly at upper RIGHT kidney but incompletely evaluated. Spleen normal size. No other gross abnormality seen. Musculoskeletal: No osseous abnormalities. IMPRESSION: Extensive mediastinal and to lesser degree hilar  adenopathy with conglomerate soft tissue mass/adenopathy within anterior mediastinum measuring 11.2 x 7.9 x 13 cm. Differential diagnosis includes lymphoma, metastatic disease, Castleman disease, reactive process considered less likely. Other processes such as thymoma, teratoma, and thyroid tissue/tumor can involve the anterior mediastinum though these are considered less likely and lymphadenopathy is preferred. Consider either PET-CT imaging or tissue diagnosis. Compression of LEFT brachiocephalic vein and SVC within the mediastinum though the vascular structures remain patent. Electronically Signed   By: MLavonia DanaM.D.   On: 07/01/2022 15:38   DG Chest 2 View  Result Date: 07/01/2022 CLINICAL DATA:  Shortness of breath for 1 month.  History of asthma. EXAM: CHEST - 2 VIEW COMPARISON:  None Available. FINDINGS: Widening of the superior mediastinum with obscuration of the retrosternal space on lateral view, consistent with an anterior mediastinal mass. The heart is normal in size. No focal consolidation, pleural effusion, or pneumothorax. No acute osseous abnormality. IMPRESSION: Anterior mediastinal mass. Recommend CT chest with intravenous contrast for further evaluation. Lungs are clear. Electronically Signed   By: LIleana RoupM.D.   On: 07/01/2022 13:24  Procedures Procedures    Medications Ordered in ED Medications  ipratropium-albuterol (DUONEB) 0.5-2.5 (3) MG/3ML nebulizer solution 3 mL (3 mLs Nebulization Given 07/01/22 1953)  iohexol (OMNIPAQUE) 300 MG/ML solution 100 mL (75 mLs Intravenous Contrast Given 07/01/22 1519)  ipratropium-albuterol (DUONEB) 0.5-2.5 (3) MG/3ML nebulizer solution 3 mL (3 mLs Nebulization Given 07/01/22 1728)    ED Course/ Medical Decision Making/ A&P Clinical Course as of 07/01/22 2125  Wed Jul 01, 2022  1624 DG Chest 2 View [HS]  1713 I consulted with pulmonology.  Dr. Valeta Harms guarding biopsy off this.  They recommend IR guided core parasternal bx  [HS]     Clinical Course User Index [HS] Sherrill Raring, PA-C                           Medical Decision Making Amount and/or Complexity of Data Reviewed Labs: ordered. Radiology: ordered. Decision-making details documented in ED Course.  Risk Prescription drug management. Decision regarding hospitalization.   Patient presents due to shortness of breath.  Differential includes but not limited to malignancy, pneumonia, pneumothorax, PE, asthma exacerbation, thyrotoxic-itis, metabolic abnormality, arrhythmia, atypical ACS.  External medical records reviewed.  Independent story and patient's father.  Although technically not hypoxic patient's oxygen has been between 92 to 96%.  He is tachycardic on cardiac monitoring at 120 per my interpretation.  Afebrile but mild tachypnea with wheezing on exam.  I ordered DuoNebs for the patient.  On reevaluation wheezing mildly improved.  I ordered, viewed and interpreted laboratory work-up. CBC without leukocytosis or anemia. Magnesium within normal limits at 2.2. COVID and flu negative. CMP without gross electrolyte derangement, AKI or transaminitis. Troponin nondetectable less than 2. TSH wnl Free T4 wnl  Patient is in sinus tachycardia on cardiac monitoring, interpretation.  EKG shows sinus tachycardia.  I ordered and reviewed interpreted imaging studies. Chest x-ray notable for anterior mediastinal mass.  We will proceed with CT chest with contrast. CT chest with contrast concerning for adenopathy, mediastinal mass.  I consulted with oncologist Dr. Alen Blew regarding abnormal CT.  Concerning for possible new lymphoma.  Patient will need biopsy, recommended pulm versus thoracic surgery.  Patient could follow-up  outpatient if stable but if concerns about follow-up, presentation appropriate for admission. I consulted with Dr. Valeta Harms with pulmonology (see ED course).   I considered sending the patient home with outpatient follow-up.  However, patient  has limited mental capacities.  He lives with his father who is also taking care of 2 other adopted children with complicated medical history and his wife who is bedbound.  I am concerned patient would not do well outpatient.  Additionally he is still tachycardic and tachypneic.  Given this I will consult hospitalist service for admission for additional work-up.        Final Clinical Impression(s) / ED Diagnoses Final diagnoses:  Mediastinal mass    Rx / DC Orders ED Discharge Orders     None         Sherrill Raring, Hershal Coria 07/01/22 2125    Sherwood Gambler, MD 07/03/22 1535

## 2022-07-02 ENCOUNTER — Encounter (HOSPITAL_COMMUNITY): Payer: Self-pay | Admitting: Family Medicine

## 2022-07-02 DIAGNOSIS — J45901 Unspecified asthma with (acute) exacerbation: Secondary | ICD-10-CM | POA: Diagnosis not present

## 2022-07-02 DIAGNOSIS — J9859 Other diseases of mediastinum, not elsewhere classified: Secondary | ICD-10-CM | POA: Diagnosis not present

## 2022-07-02 DIAGNOSIS — Q860 Fetal alcohol syndrome (dysmorphic): Secondary | ICD-10-CM | POA: Diagnosis not present

## 2022-07-02 LAB — HIV ANTIBODY (ROUTINE TESTING W REFLEX): HIV Screen 4th Generation wRfx: NONREACTIVE

## 2022-07-02 NOTE — Progress Notes (Addendum)
Initial Nutrition Assessment  DOCUMENTATION CODES:   Obesity unspecified  INTERVENTION:  - Advance diet as tolerated.   NUTRITION DIAGNOSIS:   Inadequate oral intake related to inability to eat as evidenced by NPO status.  GOAL:   Patient will meet greater than or equal to 90% of their needs  MONITOR:   PO intake  REASON FOR ASSESSMENT:   Consult Assessment of nutrition requirement/status  ASSESSMENT:   31 y.o. male admits related to SOB. PMH includes: asthma, ADHD, fetal alcohol syndrome, developmental delay. Pt is currently receiving medical management related to fetal alchol syndrome.  Meds reviewed. Labs reviewed.   Pt reports that he has a good appetite and was eating well PTA. He denies any wt loss. Pt is currently NPO for procedure. RD will continue to monitor PO intakes.   NUTRITION - FOCUSED PHYSICAL EXAM:  Flowsheet Row Most Recent Value  Orbital Region No depletion  Upper Arm Region No depletion  Thoracic and Lumbar Region No depletion  Buccal Region No depletion  Temple Region No depletion  Clavicle Bone Region No depletion  Clavicle and Acromion Bone Region No depletion  Scapular Bone Region No depletion  Dorsal Hand No depletion  Patellar Region No depletion  Anterior Thigh Region No depletion  Posterior Calf Region No depletion  Edema (RD Assessment) None  Hair Reviewed  Eyes Reviewed  Mouth Reviewed  Skin Reviewed  Nails Reviewed       Diet Order:   Diet Order             Diet NPO time specified Except for: Sips with Meds  Diet effective midnight                   EDUCATION NEEDS:   No education needs have been identified at this time  Skin:  Skin Assessment: Reviewed RN Assessment  Last BM:     Height:   Ht Readings from Last 1 Encounters:  07/01/22 '5\' 8"'$  (1.727 m)    Weight:   Wt Readings from Last 1 Encounters:  07/01/22 116.6 kg    Ideal Body Weight:  70 kg  BMI:  Body mass index is 39.09  kg/m.  Estimated Nutritional Needs:   Kcal:  8295-6213 kcals  Protein:  115-135 gm  Fluid:  >/= 2.3L  Thalia Bloodgood, RD, LDN, CNSC

## 2022-07-02 NOTE — Evaluation (Signed)
Occupational Therapy Evaluation Patient Details Name: Paul [name] MRN: [id number] DOB: [birth year] Today's Date: 07/02/2022   History of Present Illness 31 y.o. male with medical history significant of asthma, ADHD, fetal alcohol syndrome, developmental delay.   Presented with a 2 week h/o SOB, wheezing.  CT chest shows extensive mediastinal and hilar adenopathy.   Clinical Impression   Patient admitted for the diagnosis above.  PTA he lives with his parents, walks without an AD, and relies on parents with medication management, but otherwise in [place] I with ADL and iADL completion.  Patient states his mother just returned from SNF level rehab, and continues with [facility].  Patient is essentially at his baseline for ADL and in room mobility.  No significant OT needs exist in the acute setting.  No post acute OT is indicated.  Recommend mobility with staff.        Recommendations for follow up therapy are one component of a multi-disciplinary discharge planning process, led by the attending physician.  Recommendations may be updated based on patient status, additional functional criteria and insurance authorization.   Follow Up Recommendations  No OT follow up    Assistance Recommended at Discharge Set up Supervision/Assistance  Patient can return home with the following Assist for transportation;Direct supervision/assist for medications management    Functional Status Assessment  Patient has not had a recent decline in their functional status  Equipment Recommendations  None recommended by OT    Recommendations for Other Services       Precautions / Restrictions Precautions Precautions: Other (comment) Precaution Comments: Watch O2 Restrictions Weight Bearing Restrictions: No      Mobility Bed Mobility Overal bed mobility: Independent                  Transfers Overall transfer level: Modified independent                 General transfer comment: cues for new  environment      Balance Overall balance assessment: No apparent balance deficits (not formally assessed)                                         ADL either performed or assessed with clinical judgement   ADL Overall ADL's : At baseline                                             Vision Baseline Vision/History: 1 Wears glasses Patient Visual Report: No change from baseline       Perception Perception Perception: Within Functional Limits   Praxis Praxis Praxis: Intact    Pertinent Vitals/Pain Pain Assessment Pain Assessment: No/denies pain     Hand Dominance Right   Extremity/Trunk Assessment Upper Extremity Assessment Upper Extremity Assessment: Overall WFL for tasks assessed   Lower Extremity Assessment Lower Extremity Assessment: Defer to PT evaluation   Cervical / Trunk Assessment Cervical / Trunk Assessment: Normal   Communication Communication Communication: No difficulties   Cognition Arousal/Alertness: Awake/alert Behavior During Therapy: WFL for tasks assessed/performed Overall Cognitive Status: History of cognitive impairments - at baseline  General Comments   VSS with OT    Exercises     Shoulder Instructions      Home Living Family/patient expects to be discharged to:: Private residence Living Arrangements: Parent Available Help at Discharge: Family;Available 24 hours/day Type of Home: House Home Access: Stairs to enter [employer] of Steps: 2 Entrance Stairs-Rails: None Home Layout: One level     Bathroom Shower/Tub: Tub/shower unit;Walk-in shower   Bathroom Toilet: Standard Bathroom Accessibility: Yes How Accessible: Accessible via walker Home Equipment: None          Prior Functioning/Environment Prior Level of Function : Needs assist  Cognitive Assist : ADLs (cognitive)   ADLs (Cognitive): Set up cues       Mobility  Comments: Ind w/o AD ADLs Comments: Parents assist with medications.  Patient Ind with ADL and participates with iADL.  Patient does not drive.        OT Problem List:        OT Treatment/Interventions:      OT Goals(Current goals can be found in the care plan section) Acute Rehab OT Goals Patient Stated Goal: Go home OT Goal Formulation: With patient Time For Goal Achievement: 07/16/22 Potential to Achieve Goals: Good  OT Frequency:      Co-evaluation              AM-PAC OT "6 Clicks" Daily Activity     Outcome Measure Help from another person eating meals?: None Help from another person taking care of personal grooming?: None Help from another person toileting, which includes using toliet, bedpan, or urinal?: None Help from another person bathing (including washing, rinsing, drying)?: None Help from another person to put on and taking off regular upper body clothing?: None Help from another person to put on and taking off regular lower body clothing?: None 6 Click Score: 24   End of Session Nurse Communication: Mobility status  Activity Tolerance: Patient tolerated treatment well Patient left: in chair;with call bell/phone within reach  OT Visit Diagnosis: Other (comment) (Shortness of breath)                Time: 4270-6237 OT Time Calculation (min): 16 min Charges:  OT General Charges $OT Visit: 1 Visit OT Evaluation $OT Eval Moderate Complexity: 1 Mod  07/02/2022  RP, OTR/L  Acute Rehabilitation Services  Office:  [phone]   Metta Clines 07/02/2022, 8:43 AM

## 2022-07-02 NOTE — Consult Note (Addendum)
Chief Complaint: Patient was seen in consultation today for mediastinal mass biopsy Chief Complaint  Patient presents with   Shortness of Breath   at the request of Jennette Kettle  Referring Physician(s): Jennette Kettle  Supervising Physician: Markus Daft  Patient Status: [facility] - In-pt  History of Present Illness: Paul [name] is a 31 y.o. male with PMHs of ADD/ADHD, fetal alcohol syndrome, development delay, exercise induced asthma, and incidental finding of mediastinal mass.   Pt presented to ED on 10/25 due to SOB x 1 month, CXR showed anterior mediastinal mass, subsequent CT chest with contrast showed:   Extensive mediastinal and to lesser degree hilar adenopathy with conglomerate soft tissue mass/adenopathy within anterior mediastinum measuring 11.2 x 7.9 x 13 cm.   Differential diagnosis includes lymphoma, metastatic disease, Castleman disease, reactive process considered less likely.   Other processes such as thymoma, teratoma, and thyroid tissue/tumor can involve the anterior mediastinum though these are considered less likely and lymphadenopathy is preferred.   Consider either PET-CT imaging or tissue diagnosis.   Compression of LEFT brachiocephalic vein and SVC within the mediastinum though the vascular structures remain patent.  Patient was admitted for further workup and oncology was consulted who recommended biopsy of the mediastinal mass.  CT reviewed, the mass is amenable for percutaneous biopsy per Dr. Anselm Pancoast.   Patient laying in bed, not in acute distress. Family member at bedside.  Denise headache, fever, chills, shortness of breath, cough, chest pain, abdominal pain, nausea ,vomiting, and bleeding.   Past Medical History:  Diagnosis Date   ADD (attention deficit disorder)    ADHD (attention deficit hyperactivity disorder)    Asthma    excercise induced and pollen   Auditory processing disorder    Complication of anesthesia    mother states pt.  is harder to put under anes. and hard to wake up due to fetal alcohol syndrome; can be combative, per mother; states he will do better if mother is in PACU when he is coming out of anes.   Development delay    states mental status of a 31 year old   Exercise-induced asthma    prn inhaler/neb.   Fetal alcohol syndrome    Narcolepsy    Non-restorable tooth 09/2015   teeth   Separation anxiety    Twin birth     Past Surgical History:  Procedure Laterality Date   MULTIPLE EXTRACTIONS WITH ALVEOLOPLASTY N/A 09/23/2015   Procedure: MULTIPLE EXTRACTION WITH ALVEOLOPLASTY;  Surgeon: Diona Browner, DDS;  Location: [place];  Service: Oral Surgery;  Laterality: N/A;   PYLOROMYOTOMY     age [age], performed found to not have stenosis on surgical exam   PYLOROMYOTOMY     did not have pyloric stenosis; did surgery on the wrong twin   RADIOLOGY WITH ANESTHESIA N/A 01/16/2020   Procedure: MRI WITH ANESTHESIA  BRAIN WITH AND WITHOUT CONTRAST;  Surgeon: Radiologist, Medication, MD;  Location: [place];  Service: Radiology;  Laterality: N/A;    Allergies: Other  Medications: Prior to Admission medications   Medication Sig Start Date End Date Taking? Authorizing Provider  albuterol (PROVENTIL HFA;VENTOLIN HFA) 108 (90 BASE) MCG/ACT inhaler Inhale into the lungs every 6 (six) hours as needed for wheezing or shortness of breath.   Yes [provider]  albuterol (PROVENTIL) (5 MG/ML) 0.5% nebulizer solution Take 2.5 mg by nebulization every 6 (six) hours as needed for wheezing or shortness of breath.   Yes [provider]  Cholecalciferol (VITAMIN D3)  250 MCG (10000 UT) capsule Take 10,000 Units by mouth daily.   Yes [provider]  loratadine (CLARITIN) 10 MG tablet Take 10 mg by mouth daily.   Yes [provider]  Multiple Vitamin (MULTIVITAMIN WITH MINERALS) TABS tablet Take 1 tablet by mouth daily.   Yes [provider]     Family History   Adopted: Yes  Problem Relation Age of Onset   Mental illness Mother    Diabetes Mother    Hypertension Mother    Schizophrenia Mother    Bipolar disorder Mother    Mental retardation Father     Social History   Socioeconomic History   Marital status: Single    Spouse name: Not on file   Number of children: Not on file   Years of education: Not on file   Highest education level: Not on file  Occupational History   Not on file  Tobacco Use   Smoking status: Never   Smokeless tobacco: Never  Substance and Sexual Activity   Alcohol use: No   Drug use: No   Sexual activity: Not on file  Other Topics Concern   Not on file  Social History Narrative   ** Merged History Encounter **       Social Determinants of Health   Financial Resource Strain: Not on file  Food Insecurity: Not on file  Transportation Needs: Not on file  Physical Activity: Not on file  Stress: Not on file  Social Connections: Not on file     Review of Systems: A 12 point ROS discussed and pertinent positives are indicated in the HPI above.  All other systems are negative.  Vital Signs: BP 110/75 (BP Location: Left Arm)   Pulse 100   Temp 98.2 F (36.8 C) (Oral)   Resp 18   Ht '5\' 8"'$  (1.727 m)   Wt 257 lb 0.9 oz (116.6 kg)   SpO2 93%   BMI 39.09 kg/m    Physical Exam Vitals reviewed.  Constitutional:      General: He is not in acute distress.    Appearance: He is not ill-appearing.  HENT:     Head: Normocephalic.  Cardiovascular:     Rate and Rhythm: Normal rate and regular rhythm.  Pulmonary:     Effort: Pulmonary effort is normal.     Breath sounds: Examination of the left-lower field reveals wheezing. Wheezing present.  Skin:    General: Skin is warm and dry.     Coloration: Skin is not cyanotic or pale.  Neurological:     Mental Status: He is alert.  Psychiatric:        Mood and Affect: Mood normal.        Behavior: Behavior normal.     MD Evaluation Airway: WNL Heart:  WNL Abdomen: WNL Chest/ Lungs: WNL ASA  Classification: 3 Mallampati/Airway Score: Two  Imaging: CT Chest W Contrast  Result Date: 07/01/2022 CLINICAL DATA:  Abnormal chest radiograph, anterior mediastinal mass EXAM: CT CHEST WITH CONTRAST TECHNIQUE: Multidetector CT imaging of the chest was performed during intravenous contrast administration. RADIATION DOSE REDUCTION: This exam was performed according to the departmental dose-optimization program which includes automated exposure control, adjustment of the mA and/or kV according to patient size and/or use of iterative reconstruction technique. CONTRAST:  80m OMNIPAQUE IOHEXOL 300 MG/ML  SOLN IV COMPARISON:  Chest radiograph 07/01/2022 FINDINGS: Cardiovascular: Vascular structures patent. Compression of LEFT brachiocephalic vein and SVC within the mediastinum. Aorta normal caliber. Heart size  normal. No pericardial effusion. Mediastinum/Nodes: Esophagus unremarkable. Base of cervical region normal appearance. Axilla clear bilaterally. Extensive mediastinal and to lesser degree hilar adenopathy. Conglomerate soft tissue mass/adenopathy within anterior mediastinum measuring 11.2 cm transverse, 7.9 cm AP, and 13 cm length. Additional adenopathy at AP window where a 27 mm short axis node is seen. LEFT retro hilar node 25 mm. RIGHT hilar node 21 mm. RIGHT paratracheal node 20 mm. None of the observed nodes are calcified or grossly necrotic. Lungs/Pleura: Lungs clear. No pulmonary infiltrate, pleural effusion, or pneumothorax. Upper Abdomen: Respiratory motion artifacts significantly degrade upper abdomen assessment. Probable cyst posteriorly at upper RIGHT kidney but incompletely evaluated. Spleen normal size. No other gross abnormality seen. Musculoskeletal: No osseous abnormalities. IMPRESSION: Extensive mediastinal and to lesser degree hilar adenopathy with conglomerate soft tissue mass/adenopathy within anterior mediastinum measuring 11.2 x 7.9 x 13 cm.  Differential diagnosis includes lymphoma, metastatic disease, Castleman disease, reactive process considered less likely. Other processes such as thymoma, teratoma, and thyroid tissue/tumor can involve the anterior mediastinum though these are considered less likely and lymphadenopathy is preferred. Consider either PET-CT imaging or tissue diagnosis. Compression of LEFT brachiocephalic vein and SVC within the mediastinum though the vascular structures remain patent. Electronically Signed   By: Lavonia Dana M.D.   On: 07/01/2022 15:38   DG Chest 2 View  Result Date: 07/01/2022 CLINICAL DATA:  Shortness of breath for 1 month.  History of asthma. EXAM: CHEST - 2 VIEW COMPARISON:  None Available. FINDINGS: Widening of the superior mediastinum with obscuration of the retrosternal space on lateral view, consistent with an anterior mediastinal mass. The heart is normal in size. No focal consolidation, pleural effusion, or pneumothorax. No acute osseous abnormality. IMPRESSION: Anterior mediastinal mass. Recommend CT chest with intravenous contrast for further evaluation. Lungs are clear. Electronically Signed   By: Ileana Roup M.D.   On: 07/01/2022 13:24    Labs:  CBC: Recent Labs    07/01/22 1357  WBC 6.6  HGB 14.4  HCT 41.6  PLT 266    COAGS: No results for input(s): "INR", "APTT" in the last 8760 hours.  BMP: Recent Labs    07/01/22 1357  NA 138  K 3.9  CL 102  CO2 28  GLUCOSE 100*  BUN 11  CALCIUM 9.6  CREATININE 0.94  GFRNONAA >60    LIVER FUNCTION TESTS: Recent Labs    07/01/22 1357  BILITOT 0.5  AST 22  ALT 31  ALKPHOS 113  PROT 7.5  ALBUMIN 4.6    TUMOR MARKERS: No results for input(s): "AFPTM", "CEA", "CA199", "CHROMGRNA" in the last 8760 hours.  Assessment and Plan: 31 y.o. male with mediastinal mass who is in need of biopsy.   VSS CBC/CMP stable  No INR in chart Not on AC/AP  Risks and benefits of mediastinal mass bx was discussed with the patient  and/or patient's family including, but not limited to bleeding, infection, damage to adjacent structures or low yield requiring additional tests.  Mother Mrs. Calix Heinbaugh states that the patient was previously seated for CT/MRI/dental surgery, he required heavy sedation. She also states that some medication has opposite effect on the patient. - IR team notified.   All of the questions were answered and there is agreement to proceed.  Consent signed and in IR.   The procedure is tentatively scheduled for tomorrow pending IR schedule.  It is possible that the procedure will not be done tomorrow due to scheduled outpatient procedure/urgent consults, then this will be  scheduled for early nest week.  Biopsy can be performed as outpatient, please place an outpatient biopsy order and it will be scheduled via central scheduling department.  Mrs. Carns aware that biopsy may be performed tomorrow.   NPO at [facility] for possible bx tomorrow.    Thank you for this interesting consult.  I greatly enjoyed meeting Paul [name] and look forward to participating in their care.  A copy of this report was sent to the requesting provider on this date.  Electronically Signed: Tera Mater, PA-C 07/02/2022, 4:08 PM   I spent a total of 40 Minutes    in face to face in clinical consultation, greater than 50% of which was counseling/coordinating care for mediastinal mass biopsy.  This chart was dictated using voice recognition software.  Despite best efforts to proofread,  errors can occur which can change the documentation meaning.

## 2022-07-02 NOTE — Progress Notes (Signed)
Spoke to mother and gave brief undate was in antoeht pattient room

## 2022-07-02 NOTE — Hospital Course (Addendum)
Paul [name] is a 31 y.o. male with medical history significant of asthma, ADHD,, fetal alcohol syndrome, developmental delay presented hospital with 2-week history of shortness of breath wheezing.  Despite breathing treatment he was still tachypneic and tachycardic.  CT scan of the chest was done which showed extensive.mediastinal and hilar adenopathy with possible soft tissue mass within anterior mediastinum suspicious for primary cancer, metastatic disease or lymphoma.  ED physician spoke with oncology who recommended biopsy.  Pulmonary was also notified.  Due to developmental delay especially the patient was admitted hospital for additional work-up.

## 2022-07-02 NOTE — TOC Initial Note (Signed)
Transition of Care [facility]) - Initial/Assessment Note    Patient Details  Name: Paul [name] MRN: [id number] Date of Birth: July 05, 1991  Transition of Care [facility]) CM/SW Contact:    Tom-Johnson, Renea Ee, RN Phone Number: 07/02/2022, 8:56 PM  Clinical Narrative:                  CM spoke with patent at bedside about needs for post hospital transition. Admitted for Mediastinal Mass. Biopsy scheduled tentatively for tomorrow. Patient has hx of ADD/ADHD, Asthma, Fetal Alcohol Syndrome and Developmental Delay. From home with parents and siblings. Does not have DME at home.  PCP is Lazoff, Shawn P, DO and uses [facility] on [address]  No PT/OT f/u noted. CM will continue to follow as patient progresses with care towards discharge.     Expected Discharge Plan: Home/Self Care Barriers to Discharge: Continued Medical Work up   Patient Goals and CMS Choice Patient states their goals for this hospitalization and ongoing recovery are:: To return home. CMS Medicare.gov Compare Post Acute Care list provided to:: Patient Choice offered to / list presented to : NA  Expected Discharge Plan and Services Expected Discharge Plan: Home/Self Care   Discharge Planning Services: CM Consult   Living arrangements for the past 2 months: Single Family Home                 DME Arranged: N/A DME Agency: NA       HH Arranged: NA HH Agency: NA        Prior Living Arrangements/Services Living arrangements for the past 2 months: Single Family Home Lives with:: Parents, Siblings Patient language and need for interpreter reviewed:: Yes Do you feel safe going back to the place where you live?: Yes      Need for Family Participation in Patient Care: Yes (Comment) Care giver support system in place?: Yes (comment)   Criminal Activity/Legal Involvement Pertinent to Current Situation/Hospitalization: No - Comment as needed  Activities of Daily Living Home Assistive  Devices/Equipment: None ADL Screening (condition at time of admission) Patient's cognitive ability adequate to safely complete daily activities?: Yes Is the patient deaf or have difficulty hearing?: No Does the patient have difficulty seeing, even when wearing glasses/contacts?: No Does the patient have difficulty concentrating, remembering, or making decisions?: No Patient able to express need for assistance with ADLs?: Yes Does the patient have difficulty dressing or bathing?: No Independently performs ADLs?: Yes (appropriate for developmental age) Does the patient have difficulty walking or climbing stairs?: No Weakness of Legs: None Weakness of Arms/Hands: None  Permission Sought/Granted Permission sought to share information with : Case Manager, Family Supports Permission granted to share information with : Yes, Verbal Permission Granted              Emotional Assessment Appearance:: Appears stated age, Developmentally appropriate Attitude/Demeanor/Rapport: Engaged, Gracious Affect (typically observed): Accepting, Appropriate, Calm, Hopeful, Pleasant Orientation: : Oriented to Self, Oriented to Place, Oriented to  Time, Oriented to Situation Alcohol / Substance Use: Not Applicable Psych Involvement: No (comment)  Admission diagnosis:  Mediastinal mass [J98.59] Patient Active Problem List   Diagnosis Date Noted   Mediastinal mass 07/01/2022   Asthma, chronic, unspecified asthma severity, with acute exacerbation 07/01/2022   Fetal alcohol syndrome 07/01/2022   Fatigue 12/28/2013   PCP:  Percell Belt, DO Pharmacy:   [id number] - [address] [place] [address] Phone: [phone] Fax: [phone]  Social Determinants of Health (SDOH) Interventions    Readmission Risk Interventions     No data to display           

## 2022-07-02 NOTE — Evaluation (Signed)
Physical Therapy Evaluation Patient Details Name: Paul [name] MRN: [id number] DOB: April 17, 1991 Today's Date: 07/02/2022  History of Present Illness  31 y.o. male with medical history significant of asthma, ADHD, fetal alcohol syndrome, developmental delay.   Presented with a 2 week h/o SOB, wheezing.  CT chest shows extensive mediastinal and hilar adenopathy.  Clinical Impression  Patient functioning at baseline for mobility with no AD. He lives with parents who assist with medication management due to hx of cognitive deficits. SpO2 dropped to 86% during mobility but able to increase to 95% after seated rest break and cues for pursed lip breathing. No further skilled PT needs identified acutely. No PT follow up recommended at this time. PT will sign off.        Recommendations for follow up therapy are one component of a multi-disciplinary discharge planning process, led by the attending physician.  Recommendations may be updated based on patient status, additional functional criteria and insurance authorization.  Follow Up Recommendations No PT follow up      Assistance Recommended at Discharge PRN  Patient can return home with the following       Equipment Recommendations None recommended by PT  Recommendations for Other Services       Functional Status Assessment Patient has not had a recent decline in their functional status     Precautions / Restrictions Precautions Precautions: Other (comment) Precaution Comments: Watch O2 Restrictions Weight Bearing Restrictions: No      Mobility  Bed Mobility               General bed mobility comments: up in recliner    Transfers Overall transfer level: Modified independent Equipment used: None                    Ambulation/Gait Ambulation/Gait assistance: Modified independent (Device/Increase time) Gait Distance (Feet): 200 Feet Assistive device: None Gait Pattern/deviations: WFL(Within Functional  Limits)       General Gait Details: spO2 dropped to 86% on RA. Able to increase to 95% with seated rest break and cues for pursed lip breathing. Patient holding breath during ambulation due to need to cough in hallway  Stairs Stairs: Yes Stairs assistance: Modified independent (Device/Increase time) Stair Management: No rails, Alternating pattern, Forwards Number of Stairs: 3    Wheelchair Mobility    Modified Rankin (Stroke Patients Only)       Balance Overall balance assessment: No apparent balance deficits (not formally assessed)                                           Pertinent Vitals/Pain Pain Assessment Pain Assessment: No/denies pain    Home Living Family/patient expects to be discharged to:: Private residence Living Arrangements: Parent Available Help at Discharge: Family;Available 24 hours/day Type of Home: House Home Access: Stairs to enter Entrance Stairs-Rails: None Entrance Stairs-Number of Steps: 2   Home Layout: One level Home Equipment: None      Prior Function Prior Level of Function : Needs assist  Cognitive Assist : ADLs (cognitive)   ADLs (Cognitive): Set up cues       Mobility Comments: Ind w/o AD ADLs Comments: Parents assist with medications.  Patient Ind with ADL and participates with iADL.  Patient does not drive.     Hand Dominance   Dominant Hand: Right    Extremity/Trunk Assessment   Upper  Extremity Assessment Upper Extremity Assessment: Defer to OT evaluation    Lower Extremity Assessment Lower Extremity Assessment: Overall WFL for tasks assessed    Cervical / Trunk Assessment Cervical / Trunk Assessment: Normal  Communication   Communication: No difficulties  Cognition Arousal/Alertness: Awake/alert Behavior During Therapy: WFL for tasks assessed/performed Overall Cognitive Status: History of cognitive impairments - at baseline                                           General Comments      Exercises     Assessment/Plan    PT Assessment Patient does not need any further PT services  PT Problem List         PT Treatment Interventions      PT Goals (Current goals can be found in the Care Plan section)  Acute Rehab PT Goals Patient Stated Goal: to breathe better PT Goal Formulation: All assessment and education complete, DC therapy    Frequency       Co-evaluation               AM-PAC PT "6 Clicks" Mobility  Outcome Measure Help needed turning from your back to your side while in a flat bed without using bedrails?: None Help needed moving from lying on your back to sitting on the side of a flat bed without using bedrails?: None Help needed moving to and from a bed to a chair (including a wheelchair)?: None Help needed standing up from a chair using your arms (e.g., wheelchair or bedside chair)?: None Help needed to walk in hospital room?: None Help needed climbing 3-5 steps with a railing? : None 6 Click Score: 24    End of Session   Activity Tolerance: Patient tolerated treatment well Patient left: in chair;with call bell/phone within reach Nurse Communication: Mobility status PT Visit Diagnosis: Muscle weakness (generalized) (M62.81)    Time: 7867-6720 PT Time Calculation (min) (ACUTE ONLY): 9 min   Charges:   PT Evaluation $PT Eval Low Complexity: 1 Low          Adaysha Dubinsky A. Gilford Rile PT, DPT Acute Rehabilitation Services Office [phone]   Linna Hoff 07/02/2022, 11:00 AM

## 2022-07-02 NOTE — Progress Notes (Signed)
Called Helene Kelp (Mother)  that the Biopsy had to be moved to tomorrow.  She thanked me for the update and had no other questions.

## 2022-07-02 NOTE — Progress Notes (Addendum)
PROGRESS NOTE    Paul [name]  UEA:[id number] DOB: 22-Nov-1990 DOA: 07/01/2022 PCP: Percell Belt, DO    Brief Narrative:  Paul [name] is a 31 y.o. male with medical history significant of asthma, ADHD,, fetal alcohol syndrome, developmental delay presented hospital with 2-week history of shortness of breath and wheezing.  Despite breathing treatment he was still tachypneic and tachycardic.  CT scan of the chest was done which showed extensive.mediastinal and hilar adenopathy with possible soft tissue mass within anterior mediastinum suspicious for primary cancer, metastatic disease or lymphoma.  ED physician spoke with oncology who recommended biopsy.  Pulmonary was also notified.  Due to developmental delay and need for special needs.,the patient was admitted hospital for additional work-up.  IR has plans for biopsy today.  Assessment and Plan:  * Mediastinal mass Plan for biopsy.  Pulmonary recommended IR for biopsy.  Currently NPO.  IR has been consulted for biopsy.  Thyroid function test within normal limits.  HIV was nonreactive.  No leukocytosis.  COVID and influenza was negative.   Asthma, chronic, unspecified asthma severity, with acute exacerbation Continue bronchodilators/inhaled steroids.  COVID and influenza was negative.  WBC was 6.6.   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  Lives with father who is guardian / caregiver.  LFTs within normal limits.    DVT prophylaxis: SCDs Start: 07/01/22 2243   Code Status:     Code Status: Full Code  Disposition: Home  Status is: Inpatient  Remains inpatient appropriate because: Need for biopsy,   Family Communication: None at bedside  Consultants:  Interventional radiology  Procedures:  None yet  Antimicrobials:  None  Anti-infectives (From admission, onward)    None      Subjective: Today, patient was seen and examined at bedside.  Patient still has some cough and shortness of breath  with clear phlegm production.  Denies any fever chills or rigor.  Objective: Vitals:   07/01/22 2148 07/02/22 0226 07/02/22 0922 07/02/22 0934  BP: 110/77 119/75 110/75   Pulse: (!) 109 100 100   Resp: '20 20 18   '$ Temp: 98 F (36.7 C) (!) 97.3 F (36.3 C) 98.2 F (36.8 C)   TempSrc: Oral Oral Oral   SpO2: 93% 95% 96% 93%  Weight:      Height:        Intake/Output Summary (Last 24 hours) at 07/02/2022 1059 Last data filed at 07/02/2022 0900 Gross per 24 hour  Intake 0 ml  Output 0 ml  Net 0 ml   Filed Weights   07/01/22 1255  Weight: 116.6 kg    Physical Examination: Body mass index is 39.09 kg/m.   General: Obese built, not in obvious distress HENT:   No scleral pallor or icterus noted. Oral mucosa is moist.  Chest:    Diminished breath sounds bilaterally. No crackles or wheezes.  CVS: S1 &S2 heard. No murmur.  Regular rate and rhythm. Abdomen: Soft, nontender, nondistended.  Bowel sounds are heard.   Extremities: No cyanosis, clubbing or edema.  Peripheral pulses are palpable. Psych: Alert, awake and Communicative.  Oriented CNS:  No cranial nerve deficits.  Power equal in all extremities.   Skin: Warm and dry.  No rashes noted.  Data Reviewed:   CBC: Recent Labs  Lab 07/01/22 1357  WBC 6.6  NEUTROABS 3.5  HGB 14.4  HCT 41.6  MCV 88.1  PLT 478    Basic Metabolic Panel: Recent Labs  Lab 07/01/22 1357  NA  138  K 3.9  CL 102  CO2 28  GLUCOSE 100*  BUN 11  CREATININE 0.94  CALCIUM 9.6  MG 2.2    Liver Function Tests: Recent Labs  Lab 07/01/22 1357  AST 22  ALT 31  ALKPHOS 113  BILITOT 0.5  PROT 7.5  ALBUMIN 4.6     Radiology Studies: CT Chest W Contrast  Result Date: 07/01/2022 CLINICAL DATA:  Abnormal chest radiograph, anterior mediastinal mass EXAM: CT CHEST WITH CONTRAST TECHNIQUE: Multidetector CT imaging of the chest was performed during intravenous contrast administration. RADIATION DOSE REDUCTION: This exam was performed  according to the departmental dose-optimization program which includes automated exposure control, adjustment of the mA and/or kV according to patient size and/or use of iterative reconstruction technique. CONTRAST:  4m OMNIPAQUE IOHEXOL 300 MG/ML  SOLN IV COMPARISON:  Chest radiograph 07/01/2022 FINDINGS: Cardiovascular: Vascular structures patent. Compression of LEFT brachiocephalic vein and SVC within the mediastinum. Aorta normal caliber. Heart size normal. No pericardial effusion. Mediastinum/Nodes: Esophagus unremarkable. Base of cervical region normal appearance. Axilla clear bilaterally. Extensive mediastinal and to lesser degree hilar adenopathy. Conglomerate soft tissue mass/adenopathy within anterior mediastinum measuring 11.2 cm transverse, 7.9 cm AP, and 13 cm length. Additional adenopathy at AP window where a 27 mm short axis node is seen. LEFT retro hilar node 25 mm. RIGHT hilar node 21 mm. RIGHT paratracheal node 20 mm. None of the observed nodes are calcified or grossly necrotic. Lungs/Pleura: Lungs clear. No pulmonary infiltrate, pleural effusion, or pneumothorax. Upper Abdomen: Respiratory motion artifacts significantly degrade upper abdomen assessment. Probable cyst posteriorly at upper RIGHT kidney but incompletely evaluated. Spleen normal size. No other gross abnormality seen. Musculoskeletal: No osseous abnormalities. IMPRESSION: Extensive mediastinal and to lesser degree hilar adenopathy with conglomerate soft tissue mass/adenopathy within anterior mediastinum measuring 11.2 x 7.9 x 13 cm. Differential diagnosis includes lymphoma, metastatic disease, Castleman disease, reactive process considered less likely. Other processes such as thymoma, teratoma, and thyroid tissue/tumor can involve the anterior mediastinum though these are considered less likely and lymphadenopathy is preferred. Consider either PET-CT imaging or tissue diagnosis. Compression of LEFT brachiocephalic vein and SVC within  the mediastinum though the vascular structures remain patent. Electronically Signed   By: MLavonia DanaM.D.   On: 07/01/2022 15:38   DG Chest 2 View  Result Date: 07/01/2022 CLINICAL DATA:  Shortness of breath for 1 month.  History of asthma. EXAM: CHEST - 2 VIEW COMPARISON:  None Available. FINDINGS: Widening of the superior mediastinum with obscuration of the retrosternal space on lateral view, consistent with an anterior mediastinal mass. The heart is normal in size. No focal consolidation, pleural effusion, or pneumothorax. No acute osseous abnormality. IMPRESSION: Anterior mediastinal mass. Recommend CT chest with intravenous contrast for further evaluation. Lungs are clear. Electronically Signed   By: LIleana RoupM.D.   On: 07/01/2022 13:24      LOS: 1 day    LFlora Lipps MD [facility] Available via Epic secure chat 7am-7pm After these hours, please refer to coverage provider listed on [email or web address] 07/02/2022, 10:59 AM

## 2022-07-02 NOTE — Progress Notes (Signed)
Called mother Helene Kelp  updated file with info regarding her being guardian   gave another update

## 2022-07-03 ENCOUNTER — Telehealth: Payer: Self-pay | Admitting: Hematology

## 2022-07-03 ENCOUNTER — Inpatient Hospital Stay (HOSPITAL_COMMUNITY): Payer: Medicare Other

## 2022-07-03 DIAGNOSIS — J45901 Unspecified asthma with (acute) exacerbation: Secondary | ICD-10-CM | POA: Diagnosis not present

## 2022-07-03 DIAGNOSIS — Q860 Fetal alcohol syndrome (dysmorphic): Secondary | ICD-10-CM | POA: Diagnosis not present

## 2022-07-03 DIAGNOSIS — J9859 Other diseases of mediastinum, not elsewhere classified: Secondary | ICD-10-CM | POA: Diagnosis not present

## 2022-07-03 LAB — LACTATE DEHYDROGENASE: LDH: 171 U/L (ref 98–192)

## 2022-07-03 MED ORDER — IOHEXOL 350 MG/ML SOLN
70.0000 mL | Freq: Once | INTRAVENOUS | Status: AC | PRN
  Administered 2022-07-03: 70 mL via INTRAVENOUS

## 2022-07-03 MED ORDER — LIDOCAINE HCL 1 % IJ SOLN
10.0000 mL | Freq: Once | INTRAMUSCULAR | Status: AC
  Administered 2022-07-03: 10 mL via INTRADERMAL
  Filled 2022-07-03: qty 10

## 2022-07-03 MED ORDER — MIDAZOLAM HCL 2 MG/2ML IJ SOLN
INTRAMUSCULAR | Status: AC
  Filled 2022-07-03: qty 2

## 2022-07-03 MED ORDER — MOMETASONE FURO-FORMOTEROL FUM 200-5 MCG/ACT IN AERO: 2.0000 | INHALATION_SPRAY | Freq: Two times a day (BID) | RESPIRATORY_TRACT | 0 refills | Status: DC

## 2022-07-03 MED ORDER — FENTANYL CITRATE (PF) 100 MCG/2ML IJ SOLN
INTRAMUSCULAR | Status: AC | PRN
  Administered 2022-07-03 (×2): 50 ug via INTRAVENOUS

## 2022-07-03 MED ORDER — IOHEXOL 9 MG/ML PO SOLN
ORAL | Status: AC
  Administered 2022-07-03: 500 mL
  Filled 2022-07-03: qty 1000

## 2022-07-03 MED ORDER — MIDAZOLAM HCL 2 MG/2ML IJ SOLN
INTRAMUSCULAR | Status: AC | PRN
  Administered 2022-07-03: 2 mg via INTRAVENOUS

## 2022-07-03 MED ORDER — HYDROCODONE-ACETAMINOPHEN 5-325 MG PO TABS: 1.0000 | ORAL_TABLET | ORAL | Status: DC | PRN

## 2022-07-03 MED ORDER — FENTANYL CITRATE (PF) 100 MCG/2ML IJ SOLN
INTRAMUSCULAR | Status: AC
  Filled 2022-07-03: qty 2

## 2022-07-03 MED ORDER — ONDANSETRON HCL 4 MG PO TABS: 4.0000 mg | ORAL_TABLET | Freq: Four times a day (QID) | ORAL | 0 refills | Status: DC | PRN

## 2022-07-03 NOTE — Plan of Care (Signed)

## 2022-07-03 NOTE — Progress Notes (Signed)
Mobility Specialist Progress Note:   07/03/22 1623  Mobility  Activity Ambulated independently in hallway  Level of Assistance Modified independent, requires aide device or extra time  Assistive Device None  Distance Ambulated (ft) 500 ft  Activity Response Tolerated fair  Mobility Referral Yes  $Mobility charge 1 Mobility   Pt received in bed and agreeable. Pt had 2x bouts of coughing during ambulation, no complaints of SOB. Pt left in bed with all needs met, call bell in reach, and father in room.   Richards Pherigo Mobility Specialist-Acute Rehab Secure Chat only

## 2022-07-03 NOTE — Telephone Encounter (Signed)
Scheduled appt per 10/27 secure chat from Dr. Irene Limbo. Called pt, no answer. Left msg with appt date/time. Requested for pt to call back to confirm appt.

## 2022-07-03 NOTE — Progress Notes (Signed)
Patient has returned to unit from IR. Vital signs obtained and recorded. Patient is alert and oriented x4. Patient denies pain,  discomfort, or nausea. Biopsy site assessed.  Band-aid is clean, dry, and intact. Will continue to monitor.

## 2022-07-03 NOTE — Care Management Important Message (Signed)
Important Message  Patient Details  Name: ECTOR LAUREL MRN: [id number] Date of Birth: 08/27/1991   Medicare Important Message Given:  Yes     Shelda Altes 07/03/2022, 12:13 PM

## 2022-07-03 NOTE — Progress Notes (Signed)
Hematology/Oncology short note.  Oncology consult received. Patient with mediastinal mass and hilar LNadenopathy concerning for lymphoma vs mediastinal germ cell tumor. PLan -patient had core needle biopsy today. -Plz confirm tissue adequacy with pathology. If tissue adequate for diagnosis on initial review then may start patient on Prednisone '60mg'$  po daily -if okay with guardian plz get CT abd/pelvis to complete staging workup -labs LDH,sed rate, germ cell tumor markers  Oncology will f/u when biopsy result available to decide on definitive treatment.  Sullivan Lone MD MS

## 2022-07-03 NOTE — Progress Notes (Signed)
Vital signs obtained and recorded. Biopsy site assessed. Pin drop of drainage noted on band-aid. Patient in bed resting with eyes closed. Respirations even and unlabored. Bed in lowest position. Bed alarm active. Will continue to monitor.

## 2022-07-03 NOTE — Discharge Summary (Signed)
Physician Discharge Summary  Paul [name] CNO:[id number] DOB: 10/31/1990 DOA: 07/01/2022  PCP: Percell Belt, DO  Admit date: 07/01/2022 Discharge date: 07/03/2022  Admitted From: Home  Discharge disposition: Home   Recommendations for Outpatient Follow-Up:   Follow up with your primary care provider in one week.  Check CBC, BMP, magnesium in the next visit Follow-up with Dr. Irene Limbo oncology in 1 to 2 weeks with biopsy report.  Office to schedule an appointment.  Discharge Diagnosis:   Principal Problem:   Mediastinal mass Active Problems:   Asthma, chronic, unspecified asthma severity, with acute exacerbation   Fetal alcohol syndrome   Discharge Condition: Improved.  Diet recommendation:  Regular.  Wound care: None.  Code status: Full.   History of Present Illness:   Paul [name] is a 31 y.o. male with medical history significant of asthma, ADHD,, fetal alcohol syndrome, developmental delay presented hospital with 2-week history of shortness of breath and wheezing.  Despite breathing treatment he was still tachypneic and tachycardic.  CT scan of the chest was done which showed extensive.mediastinal and hilar adenopathy with possible soft tissue mass within anterior mediastinum suspicious for primary cancer, metastatic disease or lymphoma.  ED physician spoke with oncology who recommended biopsy.  Pulmonary was also notified.  Due to developmental delay and need for special needs.,the patient was admitted hospital for additional work-up.  IR was consulted and patient underwent CT-guided core needle biopsy.  Hospital Course:   Following conditions were addressed during hospitalization as listed below,  Mediastinal mass Status post CT-guided core needle biopsy of anterior midsternal mass 07/03/2022 dimensional radiology.  We will follow biopsy report.  Thyroid function test within normal limits.  HIV was nonreactive.  No leukocytosis.  COVID and influenza was  negative.  Patient been seen by oncology today with concerns for lymphoma versus mediastinal exam cell tumor.  Plan is to start prednisone if tissue is adequate.  Oncology also plans for CT abdomen pelvis LDH sed rate esophageal tumor markers.  CT scan of the abdomen and pelvis without intraabdominal mass.  LDH sed rate and tumor markers have been sent from the floor as well.  Oncology to follow-up with biopsy as outpatient.  Dr. Irene Limbo is aware of this.   Asthma, chronic, unspecified asthma severity, with acute exacerbation Continue bronchodilators on discharge.  COVID and influenza was negative.    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  Lives with father who is guardian / caregiver.  LFTs within normal limits.  Disposition.  At this time, patient is stable for disposition home with outpatient PCP and oncology follow-up.  Patient's father is adamant about him getting discharged today.  Medical Consultants:   Oncology Dr. Irene Limbo Interventional radiology  Procedures:    CT-guided core renal biopsy of anterior midsternal mass 07/03/2022 Subjective:   Today, patient seen and examined at bedside.  No nausea vomiting fever chills or rigor.  Has mild cough.  No shortness of breath or dyspnea  Discharge Exam:   Vitals:   07/03/22 1033 07/03/22 1219  BP: 99/68 118/73  Pulse: 95 85  Resp: 18 16  Temp: 97.8 F (36.6 C) 97.7 F (36.5 C)  SpO2: 97% 94%   Vitals:   07/03/22 0945 07/03/22 1009 07/03/22 1033 07/03/22 1219  BP: 110/72 107/70 99/68 118/73  Pulse: 98 (!) 107 95 85  Resp: '17 18 18 16  '$ Temp:  (!) 97.4 F (36.3 C) 97.8 F (36.6 C) 97.7 F (36.5 C)  TempSrc:  Oral Oral Oral  SpO2: 92% 96% 97% 94%  Weight:      Height:        General: Alert awake, not in obvious distress, obese built HENT: pupils equally reacting to light,  No scleral pallor or icterus noted. Oral mucosa is moist.  Chest:    Diminished breath sounds bilaterally. No crackles or wheezes.   CVS: S1 &S2 heard. No murmur.  Regular rate and rhythm. Abdomen: Soft, nontender, nondistended.  Bowel sounds are heard.   Extremities: No cyanosis, clubbing or edema.  Peripheral pulses are palpable. Psych: Alert, awake and oriented, normal mood CNS:  No cranial nerve deficits.  Power equal in all extremities.   Skin: Warm and dry.  No rashes noted.  The results of significant diagnostics from this hospitalization (including imaging, microbiology, ancillary and laboratory) are listed below for reference.     Diagnostic Studies:   CT Chest W Contrast  Result Date: 07/01/2022 CLINICAL DATA:  Abnormal chest radiograph, anterior mediastinal mass EXAM: CT CHEST WITH CONTRAST TECHNIQUE: Multidetector CT imaging of the chest was performed during intravenous contrast administration. RADIATION DOSE REDUCTION: This exam was performed according to the departmental dose-optimization program which includes automated exposure control, adjustment of the mA and/or kV according to patient size and/or use of iterative reconstruction technique. CONTRAST:  28m OMNIPAQUE IOHEXOL 300 MG/ML  SOLN IV COMPARISON:  Chest radiograph 07/01/2022 FINDINGS: Cardiovascular: Vascular structures patent. Compression of LEFT brachiocephalic vein and SVC within the mediastinum. Aorta normal caliber. Heart size normal. No pericardial effusion. Mediastinum/Nodes: Esophagus unremarkable. Base of cervical region normal appearance. Axilla clear bilaterally. Extensive mediastinal and to lesser degree hilar adenopathy. Conglomerate soft tissue mass/adenopathy within anterior mediastinum measuring 11.2 cm transverse, 7.9 cm AP, and 13 cm length. Additional adenopathy at AP window where a 27 mm short axis node is seen. LEFT retro hilar node 25 mm. RIGHT hilar node 21 mm. RIGHT paratracheal node 20 mm. None of the observed nodes are calcified or grossly necrotic. Lungs/Pleura: Lungs clear. No pulmonary infiltrate, pleural effusion, or  pneumothorax. Upper Abdomen: Respiratory motion artifacts significantly degrade upper abdomen assessment. Probable cyst posteriorly at upper RIGHT kidney but incompletely evaluated. Spleen normal size. No other gross abnormality seen. Musculoskeletal: No osseous abnormalities. IMPRESSION: Extensive mediastinal and to lesser degree hilar adenopathy with conglomerate soft tissue mass/adenopathy within anterior mediastinum measuring 11.2 x 7.9 x 13 cm. Differential diagnosis includes lymphoma, metastatic disease, Castleman disease, reactive process considered less likely. Other processes such as thymoma, teratoma, and thyroid tissue/tumor can involve the anterior mediastinum though these are considered less likely and lymphadenopathy is preferred. Consider either PET-CT imaging or tissue diagnosis. Compression of LEFT brachiocephalic vein and SVC within the mediastinum though the vascular structures remain patent. Electronically Signed   By: MLavonia DanaM.D.   On: 07/01/2022 15:38   DG Chest 2 View  Result Date: 07/01/2022 CLINICAL DATA:  Shortness of breath for 1 month.  History of asthma. EXAM: CHEST - 2 VIEW COMPARISON:  None Available. FINDINGS: Widening of the superior mediastinum with obscuration of the retrosternal space on lateral view, consistent with an anterior mediastinal mass. The heart is normal in size. No focal consolidation, pleural effusion, or pneumothorax. No acute osseous abnormality. IMPRESSION: Anterior mediastinal mass. Recommend CT chest with intravenous contrast for further evaluation. Lungs are clear. Electronically Signed   By: LIleana RoupM.D.   On: 07/01/2022 13:24     Labs:   Basic Metabolic Panel: Recent Labs  Lab 07/01/22 1357  NA  138  K 3.9  CL 102  CO2 28  GLUCOSE 100*  BUN 11  CREATININE 0.94  CALCIUM 9.6  MG 2.2   GFR Estimated Creatinine Clearance: 142.5 mL/min (by C-G formula based on SCr of 0.94 mg/dL). Liver Function Tests: Recent Labs  Lab  07/01/22 1357  AST 22  ALT 31  ALKPHOS 113  BILITOT 0.5  PROT 7.5  ALBUMIN 4.6   No results for input(s): "LIPASE", "AMYLASE" in the last 168 hours. No results for input(s): "AMMONIA" in the last 168 hours. Coagulation profile No results for input(s): "INR", "PROTIME" in the last 168 hours.  CBC: Recent Labs  Lab 07/01/22 1357  WBC 6.6  NEUTROABS 3.5  HGB 14.4  HCT 41.6  MCV 88.1  PLT 266   Cardiac Enzymes: No results for input(s): "CKTOTAL", "CKMB", "CKMBINDEX", "TROPONINI" in the last 168 hours. BNP: Invalid input(s): "POCBNP" CBG: No results for input(s): "GLUCAP" in the last 168 hours. D-Dimer No results for input(s): "DDIMER" in the last 72 hours. Hgb A1c No results for input(s): "HGBA1C" in the last 72 hours. Lipid Profile No results for input(s): "CHOL", "HDL", "LDLCALC", "TRIG", "CHOLHDL", "LDLDIRECT" in the last 72 hours. Thyroid function studies Recent Labs    07/01/22 1357  TSH 1.353   Anemia work up No results for input(s): "VITAMINB12", "FOLATE", "FERRITIN", "TIBC", "IRON", "RETICCTPCT" in the last 72 hours. Microbiology Recent Results (from the past 240 hour(s))  Resp Panel by RT-PCR (Flu A&B, Covid) Anterior Nasal Swab     Status: None   Collection Time: 07/01/22 12:59 PM   Specimen: Anterior Nasal Swab  Result Value Ref Range Status   SARS Coronavirus 2 by RT PCR NEGATIVE NEGATIVE Final    Comment: (NOTE) SARS-CoV-2 target nucleic acids are NOT DETECTED.  The SARS-CoV-2 RNA is generally detectable in upper respiratory specimens during the acute phase of infection. The lowest concentration of SARS-CoV-2 viral copies this assay can detect is 138 copies/mL. A negative result does not preclude SARS-Cov-2 infection and should not be used as the sole basis for treatment or other patient management decisions. A negative result may occur with  improper specimen collection/handling, submission of specimen other than nasopharyngeal swab, presence of  viral mutation(s) within the areas targeted by this assay, and inadequate number of viral copies(<138 copies/mL). A negative result must be combined with clinical observations, patient history, and epidemiological information. The expected result is Negative.  Fact Sheet for Patients:  EntrepreneurPulse.com.au  Fact Sheet for Healthcare Providers:  IncredibleEmployment.be  This test is no t yet approved or cleared by the [place] FDA and  has been authorized for detection and/or diagnosis of SARS-CoV-2 by FDA under an Emergency Use Authorization (EUA). This EUA will remain  in effect (meaning this test can be used) for the duration of the COVID-19 declaration under Section 564(b)(1) of the Act, 21 U.S.C.section 360bbb-3(b)(1), unless the authorization is terminated  or revoked sooner.       Influenza A by PCR NEGATIVE NEGATIVE Final   Influenza B by PCR NEGATIVE NEGATIVE Final    Comment: (NOTE) The Xpert Xpress SARS-CoV-2/FLU/RSV plus assay is intended as an aid in the diagnosis of influenza from Nasopharyngeal swab specimens and should not be used as a sole basis for treatment. Nasal washings and aspirates are unacceptable for Xpert Xpress SARS-CoV-2/FLU/RSV testing.  Fact Sheet for Patients: EntrepreneurPulse.com.au  Fact Sheet for Healthcare Providers: IncredibleEmployment.be  This test is not yet approved or cleared by the [place] FDA and has been  authorized for detection and/or diagnosis of SARS-CoV-2 by FDA under an Emergency Use Authorization (EUA). This EUA will remain in effect (meaning this test can be used) for the duration of the COVID-19 declaration under Section 564(b)(1) of the Act, 21 U.S.C. section 360bbb-3(b)(1), unless the authorization is terminated or revoked.  Performed at [facility], [address], [place], [place] [address]      Discharge  Instructions:   Discharge Instructions     Diet general   Complete by: As directed    Discharge instructions   Complete by: As directed    Follow-up with your primary care physician in 1 week.  Follow-up with oncology Dr Irene Limbo in 1 to 2 weeks with biopsy.  Oncology office to schedule an appointment.  Seek medical attention for worsening symptoms.   Increase activity slowly   Complete by: As directed    No wound care   Complete by: As directed       Allergies as of 07/03/2022       Reactions   Other    Fetal alcohol syndrome Certain medications have adverse affect for him. Must have heavy anesthesia meds in order to work.        Medication List     TAKE these medications    albuterol 108 (90 Base) MCG/ACT inhaler Commonly known as: VENTOLIN HFA Inhale into the lungs every 6 (six) hours as needed for wheezing or shortness of breath.   albuterol (5 MG/ML) 0.5% nebulizer solution Commonly known as: PROVENTIL Take 2.5 mg by nebulization every 6 (six) hours as needed for wheezing or shortness of breath.   loratadine 10 MG tablet Commonly known as: CLARITIN Take 10 mg by mouth daily.   mometasone-formoterol 200-5 MCG/ACT Aero Commonly known as: DULERA Inhale 2 puffs into the lungs 2 (two) times daily.   multivitamin with minerals Tabs tablet Take 1 tablet by mouth daily.   ondansetron 4 MG tablet Commonly known as: ZOFRAN Take 1 tablet (4 mg total) by mouth every 6 (six) hours as needed for nausea.   Vitamin D3 250 MCG (10000 UT) capsule Take 10,000 Units by mouth daily.        Follow-up Information     Brunetta Genera, MD Follow up.   Specialties: Hematology, Oncology Why: office to setup appointment Contact information: [place] [address] [phone]         Lazoff, Raquel Sarna P, DO Follow up in 1 week(s).   Specialty: Family Medicine Contact information: [address] [place] [address] [phone]                   Time coordinating discharge: 39 minutes  Signed:  Allayah Raineri  [facility] 07/03/2022, 2:32 PM

## 2022-07-03 NOTE — Procedures (Signed)
  Procedure:  CT core biopsy anterior mediastinal mass 18g x4 Preprocedure diagnosis: The encounter diagnosis was Mediastinal mass.  Postprocedure diagnosis: same EBL:    minimal Complications:   none immediate  See full dictation in [facility].  Dillard Cannon MD Main # [phone] Pager  [phone] Mobile [phone]

## 2022-07-04 LAB — BETA HCG QUANT (REF LAB): hCG Quant: <1 m[IU]/mL (ref 0–3)

## 2022-07-05 LAB — AFP TUMOR MARKER: AFP, Serum, Tumor Marker: 1.9 ng/mL (ref 0.0–5.7)

## 2022-07-08 ENCOUNTER — Inpatient Hospital Stay: Payer: Medicare Other | Attending: Hematology | Admitting: Hematology

## 2022-07-08 ENCOUNTER — Other Ambulatory Visit: Payer: Self-pay

## 2022-07-08 VITALS — BP 109/79 | HR 82 | Temp 97.7°F | Resp 17 | Ht 68.0 in | Wt 235.1 lb

## 2022-07-08 DIAGNOSIS — C8592 Non-Hodgkin lymphoma, unspecified, intrathoracic lymph nodes: Secondary | ICD-10-CM

## 2022-07-08 DIAGNOSIS — R591 Generalized enlarged lymph nodes: Secondary | ICD-10-CM | POA: Insufficient documentation

## 2022-07-08 DIAGNOSIS — J9859 Other diseases of mediastinum, not elsewhere classified: Secondary | ICD-10-CM | POA: Diagnosis present

## 2022-07-08 MED ORDER — PREDNISONE 20 MG PO TABS: 40.0000 mg | ORAL_TABLET | Freq: Every day | ORAL | 0 refills | Status: AC

## 2022-07-08 NOTE — Progress Notes (Signed)
[facility]   HEMATOLOGY/ONCOLOGY CONSULTATION NOTE  Date of Service: 07/08/2022  Patient Care Team: Percell Belt, DO as PCP - General (Family Medicine) Scifres, Earlie Server, PA-C (Inactive) (Physician Assistant)  CHIEF COMPLAINTS/PURPOSE OF CONSULTATION:  Newly noted mediastinal mass concerning for lymphoma.  HISTORY OF PRESENTING ILLNESS:  Paul [name] is a wonderful 31 y.o. male who has been referred to [place] by .Lazoff, Shawn P, DO and Dr. Wonda Amis MD for evaluation and management of newly diagnosed mediastinal mass concerning for likely lymphoma.  Patient has a history of fetal alcohol syndrome with developmental delay, narcolepsy, ADD, exercise-induced asthma and lives with his adoptive parents. He recently presented to the hospital on 07/01/2022 with 2-week history of worsening shortness of breath cough and wheezing.  He received his asthma treatment but was still noted to have significant cough.  In the emergency room he had a chest x-ray which showed an mediastinal mass. Subsequent CT chest with contrast on 07/01/2022 showed extensive mediastinal and hilar lymphadenopathy with conglomerate soft tissue mass/adenopathy within the anterior mediastinum measuring 11.2 x 7.9 x 13 cm. Compression of the left brachiocephalic and SVC within the mid though these vascular structures remain patent.  Patient did not have any clinical signs or symptoms of SVC compression syndrome or left upper extremity swelling.  Patient subsequently had a CT of the abdomen and pelvis which showed no acute intra-abdominal or intrapelvic abnormalities. He underwent a CT-guided core needle biopsy of the mediastinal mass by interventional radiology.  The official pathology results from his biopsy are not currently available at the time of this clinic visit. I did call and talk to the pathologist Dr. Donneta Romberg and she notes that this either looks like a lymphoma or thymoma and she is running additional tests to make a  final diagnosis.  Patient's father accompanied him for this visit and his mother who helps make most of the decisions in tandem with his father was present on the phone.  They do not note any unexplained fevers chills night sweats or significant weight loss.  His breathing has been relatively stable since hospital discharge but he is still having some cough.  I confirmed adequacy of tissue sampling with the pathologist and the patient was then started on prednisone to try to help his cough by drinking his mediastinal tumor some and reducing bronchial inflammation.  Patient is unable to provide much information or review of systems on account of his developmental delay issues.   MEDICAL HISTORY:  Past Medical History:  Diagnosis Date   ADD (attention deficit disorder)    ADHD (attention deficit hyperactivity disorder)    Asthma    excercise induced and pollen   Auditory processing disorder    Complication of anesthesia    mother states pt. is harder to put under anes. and hard to wake up due to fetal alcohol syndrome; can be combative, per mother; states he will do better if mother is in PACU when he is coming out of anes.   Development delay    states mental status of a 31 year old   Exercise-induced asthma    prn inhaler/neb.   Fetal alcohol syndrome    Narcolepsy    Non-restorable tooth 09/2015   teeth   Separation anxiety    Twin birth     SURGICAL HISTORY: Past Surgical History:  Procedure Laterality Date   MULTIPLE EXTRACTIONS WITH ALVEOLOPLASTY N/A 09/23/2015   Procedure: MULTIPLE EXTRACTION WITH ALVEOLOPLASTY;  Surgeon: Diona Browner, DDS;  Location: [facility];  Service: Oral Surgery;  Laterality: N/A;   PYLOROMYOTOMY     age [age], performed found to not have stenosis on surgical exam   PYLOROMYOTOMY     did not have pyloric stenosis; did surgery on the wrong twin   RADIOLOGY WITH ANESTHESIA N/A 01/16/2020   Procedure: MRI WITH ANESTHESIA  BRAIN WITH AND  WITHOUT CONTRAST;  Surgeon: Radiologist, Medication, MD;  Location: [facility];  Service: Radiology;  Laterality: N/A;    SOCIAL HISTORY: Social History   Socioeconomic History   Marital status: Single    Spouse name: Not on file   Number of children: Not on file   Years of education: Not on file   Highest education level: Not on file  Occupational History   Not on file  Tobacco Use   Smoking status: Never   Smokeless tobacco: Never  Substance and Sexual Activity   Alcohol use: No   Drug use: No   Sexual activity: Not on file  Other Topics Concern   Not on file  Social History Narrative   ** Merged History Encounter **       Social Determinants of Health   Financial Resource Strain: Not on file  Food Insecurity: Not on file  Transportation Needs: Not on file  Physical Activity: Not on file  Stress: Not on file  Social Connections: Not on file  Intimate Partner Violence: Not on file    FAMILY HISTORY: Family History  Adopted: Yes  Problem Relation Age of Onset   Mental illness Mother    Diabetes Mother    Hypertension Mother    Schizophrenia Mother    Bipolar disorder Mother    Mental retardation Father     ALLERGIES:  is allergic to other.  MEDICATIONS:  Current Outpatient Medications  Medication Sig Dispense Refill   albuterol (PROVENTIL HFA;VENTOLIN HFA) 108 (90 BASE) MCG/ACT inhaler Inhale into the lungs every 6 (six) hours as needed for wheezing or shortness of breath.     albuterol (PROVENTIL) (5 MG/ML) 0.5% nebulizer solution Take 2.5 mg by nebulization every 6 (six) hours as needed for wheezing or shortness of breath.     Cholecalciferol (VITAMIN D3) 250 MCG (10000 UT) capsule Take 10,000 Units by mouth daily.     loratadine (CLARITIN) 10 MG tablet Take 10 mg by mouth daily.     mometasone-formoterol (DULERA) 200-5 MCG/ACT AERO Inhale 2 puffs into the lungs 2 (two) times daily. 8.8 g 0   Multiple Vitamin (MULTIVITAMIN WITH MINERALS) TABS tablet Take 1  tablet by mouth daily.     ondansetron (ZOFRAN) 4 MG tablet Take 1 tablet (4 mg total) by mouth every 6 (six) hours as needed for nausea. (Patient not taking: Reported on 07/08/2022) 20 tablet 0   No current facility-administered medications for this visit.    REVIEW OF SYSTEMS:    10 Point review of Systems was done is negative except as noted above.  PHYSICAL EXAMINATION: ECOG PERFORMANCE STATUS: 1  . Vitals:   07/08/22 1425  BP: 109/79  Pulse: 82  Resp: 17  Temp: 97.7 F (36.5 C)  SpO2: 99%   Filed Weights   07/08/22 1425  Weight: 235 lb 1.6 oz (106.6 kg)   .Body mass index is 35.75 kg/m.  GENERAL:alert, in no acute distress and comfortable SKIN: no acute rashes, no significant lesions EYES: conjunctiva are pink and non-injected, sclera anicteric OROPHARYNX: MMM, no exudates, no oropharyngeal erythema or ulceration NECK: supple, no JVD LYMPH:  no palpable lymphadenopathy in  the cervical, axillary or inguinal regions LUNGS: clear to auscultation b/l with normal respiratory effort, few scattered rhonchi HEART: regular rate & rhythm ABDOMEN:  normoactive bowel sounds , non tender, not distended. Extremity: no pedal edema PSYCH: alert & oriented x 3 with fluent speech NEURO: no focal motor/sensory deficits  LABORATORY DATA:  I have reviewed the data as listed  .    Latest Ref Rng & Units 07/01/2022    1:57 PM 09/10/2015    2:36 PM  CBC  WBC 4.0 - 10.5 K/uL 6.6  5.8   Hemoglobin 13.0 - 17.0 g/dL 14.4  14.9   Hematocrit 39.0 - 52.0 % 41.6  41.8   Platelets 150 - 400 K/uL 266  263     .    Latest Ref Rng & Units 07/01/2022    1:57 PM  CMP  Glucose 70 - 99 mg/dL 100   BUN 6 - 20 mg/dL 11   Creatinine 0.61 - 1.24 mg/dL 0.94   Sodium 135 - 145 mmol/L 138   Potassium 3.5 - 5.1 mmol/L 3.9   Chloride 98 - 111 mmol/L 102   CO2 22 - 32 mmol/L 28   Calcium 8.9 - 10.3 mg/dL 9.6   Total Protein 6.5 - 8.1 g/dL 7.5   Total Bilirubin 0.3 - 1.2 mg/dL 0.5   Alkaline  Phos 38 - 126 U/L 113   AST 15 - 41 U/L 22   ALT 0 - 44 U/L 31      RADIOGRAPHIC STUDIES: I have personally reviewed the radiological images as listed and agreed with the findings in the report. CT ABDOMEN PELVIS W CONTRAST  Result Date: 07/03/2022 CLINICAL DATA:  Lung mass, lymphadenopathy EXAM: CT ABDOMEN AND PELVIS WITH CONTRAST TECHNIQUE: Multidetector CT imaging of the abdomen and pelvis was performed using the standard protocol following bolus administration of intravenous contrast. RADIATION DOSE REDUCTION: This exam was performed according to the departmental dose-optimization program which includes automated exposure control, adjustment of the mA and/or kV according to patient size and/or use of iterative reconstruction technique. CONTRAST:  43m OMNIPAQUE IOHEXOL 350 MG/ML SOLN IV. Dilute oral contrast. COMPARISON:  CT chest 07/01/2022 FINDINGS: Lower chest: Lung bases clear. Abnormal soft tissue anterior to RIGHT atrium and RIGHT ventricle, see recent CT chest. Hepatobiliary: Gallbladder and liver normal appearance. Pancreas: Normal appearance Spleen: Normal size and appearance Adrenals/Urinary Tract: Adrenal glands normal appearance. LEFT kidney normal appearance. Small cyst posterior mid RIGHT kidney 1.9 cm diameter, simple appearance by CT; no follow-up imaging recommended. Kidneys, ureters, and bladder otherwise unremarkable. Stomach/Bowel: Food debris in stomach. Normal appendix. Stomach and bowel loops normal appearance. Vascular/Lymphatic: Vascular structures patent.  No adenopathy. Reproductive: Unremarkable prostate gland and seminal vesicles Other: No free air or free fluid. No hernia or inflammatory process. Musculoskeletal: Osseous and muscular structures unremarkable. IMPRESSION: No acute intra-abdominal or intrapelvic abnormalities. Abnormal soft tissue anterior to RIGHT atrium and RIGHT ventricle, see recent CT chest. Electronically Signed   By: MLavonia DanaM.D.   On: 07/03/2022  17:26   CT BIOPSY  Result Date: 07/03/2022 CLINICAL DATA:  Anterior mediastinal mass EXAM: CT GUIDED CORE BIOPSY OF ANTERIOR MEDIASTINAL MASS ANESTHESIA/SEDATION: Intravenous Fentanyl 107m and Versed '2mg'$  were administered as conscious sedation during continuous monitoring of the patient's level of consciousness and physiological / cardiorespiratory status by the radiology RN, with a total moderate sedation time of 15 minutes. PROCEDURE: The procedure risks, benefits, and alternatives were explained to the patient. Questions regarding the procedure were encouraged and  answered. The patient understands and consents to the procedure. Select axial scans through the thorax were obtained. The lesion was localized and an appropriate skin site was determined and marked. The operative field was prepped with chlorhexidinein a sterile fashion, and a sterile drape was applied covering the operative field. A sterile gown and sterile gloves were used for the procedure. Local anesthesia was provided with 1% Lidocaine. Under in guidance, a 97 gauge trocar needle was advanced to the margin of the lesion taking care to avoid internal mammary vessels. Once needle tip position was confirmed, coaxial 18-gauge core biopsy samples were obtained, submitted in saline to surgical pathology. The guide needle was removed. Postprocedure scans show no hemorrhage, pneumothorax, or other apparent complication. The patient tolerated the procedure well. RADIATION DOSE REDUCTION: This exam was performed according to the departmental dose-optimization program which includes automated exposure control, adjustment of the mA and/or kV according to patient size and/or use of iterative reconstruction technique. COMPLICATIONS: None immediate FINDINGS: Soft tissue attenuation anterior mediastinal mass was localized. Representative core biopsy samples obtained as above. IMPRESSION: Technically successful CT-guided core biopsy, anterior mediastinal  mass. Electronically Signed   By: Lucrezia Europe M.D.   On: 07/03/2022 13:19   CT Chest W Contrast  Result Date: 07/01/2022 CLINICAL DATA:  Abnormal chest radiograph, anterior mediastinal mass EXAM: CT CHEST WITH CONTRAST TECHNIQUE: Multidetector CT imaging of the chest was performed during intravenous contrast administration. RADIATION DOSE REDUCTION: This exam was performed according to the departmental dose-optimization program which includes automated exposure control, adjustment of the mA and/or kV according to patient size and/or use of iterative reconstruction technique. CONTRAST:  67m OMNIPAQUE IOHEXOL 300 MG/ML  SOLN IV COMPARISON:  Chest radiograph 07/01/2022 FINDINGS: Cardiovascular: Vascular structures patent. Compression of LEFT brachiocephalic vein and SVC within the mediastinum. Aorta normal caliber. Heart size normal. No pericardial effusion. Mediastinum/Nodes: Esophagus unremarkable. Base of cervical region normal appearance. Axilla clear bilaterally. Extensive mediastinal and to lesser degree hilar adenopathy. Conglomerate soft tissue mass/adenopathy within anterior mediastinum measuring 11.2 cm transverse, 7.9 cm AP, and 13 cm length. Additional adenopathy at AP window where a 27 mm short axis node is seen. LEFT retro hilar node 25 mm. RIGHT hilar node 21 mm. RIGHT paratracheal node 20 mm. None of the observed nodes are calcified or grossly necrotic. Lungs/Pleura: Lungs clear. No pulmonary infiltrate, pleural effusion, or pneumothorax. Upper Abdomen: Respiratory motion artifacts significantly degrade upper abdomen assessment. Probable cyst posteriorly at upper RIGHT kidney but incompletely evaluated. Spleen normal size. No other gross abnormality seen. Musculoskeletal: No osseous abnormalities. IMPRESSION: Extensive mediastinal and to lesser degree hilar adenopathy with conglomerate soft tissue mass/adenopathy within anterior mediastinum measuring 11.2 x 7.9 x 13 cm. Differential diagnosis  includes lymphoma, metastatic disease, Castleman disease, reactive process considered less likely. Other processes such as thymoma, teratoma, and thyroid tissue/tumor can involve the anterior mediastinum though these are considered less likely and lymphadenopathy is preferred. Consider either PET-CT imaging or tissue diagnosis. Compression of LEFT brachiocephalic vein and SVC within the mediastinum though the vascular structures remain patent. Electronically Signed   By: MLavonia DanaM.D.   On: 07/01/2022 15:38   DG Chest 2 View  Result Date: 07/01/2022 CLINICAL DATA:  Shortness of breath for 1 month.  History of asthma. EXAM: CHEST - 2 VIEW COMPARISON:  None Available. FINDINGS: Widening of the superior mediastinum with obscuration of the retrosternal space on lateral view, consistent with an anterior mediastinal mass. The heart is normal in size. No focal consolidation, pleural  effusion, or pneumothorax. No acute osseous abnormality. IMPRESSION: Anterior mediastinal mass. Recommend CT chest with intravenous contrast for further evaluation. Lungs are clear. Electronically Signed   By: Ileana Roup M.D.   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  Less likely thymoma. Given the patient's age most likely differential would include Hodgkin's lymphoma and primary mediastinal large B-cell lymphoma. The mass is causing some compression of his SVC and left brachiocephalic vein but no complete obstruction or symptoms related to this. He does not note normal his parents any obvious constitutional symptoms.  Plan -Patient's chart and hospital course was reviewed in detail in the EMR and confirmed in detail with his father and mother. -I discussed with his  parents the diagnostic considerations and also discussed the diagnostic considerations with the pathologist. -We are awaiting the final pathology result but this is most likely to be a lymphoma -We decided to pursue PET CT scan for official initial staging of his newly diagnosed tumor in the mediastinum. -We will get an echocardiogram to evaluate his heart for pleural effusion as well as to get a baseline for more treatment planning standpoint. -Blood counts available from the hospital did not show any significant cytopenias to suggest bone marrow involvement. -CT chest abdomen pelvis were reviewed with patient's parents in his presence. -Patient will likely need a Port-A-Cath placement once final diagnosis is available. -After confirming adequacy of tissue sampling for diagnostic work-up given that the patient has likely lymphoma causing venous compression in the mediastinum we decided to start him on prednisone to help shrink the tumor to help with his cough as well as reduce venous compression pending definitive treatment. -Patient has no symptoms of SVC syndrome at this time to consider rushing into radiation therapy.  Orders Placed This Encounter  Procedures   NM PET Image Initial (PI) Skull Base To Thigh    Standing Status:   Future    Standing Expiration Date:   07/08/2023    Order Specific Question:   If indicated for the ordered procedure, I authorize the administration of a radiopharmaceutical per Radiology protocol    Answer:   Yes    Order Specific Question:   Preferred imaging location?    Answer:   [facility]   ECHOCARDIOGRAM COMPLETE    Standing Status:   Future    Number of Occurrences:   1    Standing Expiration Date:   07/09/2023    Order Specific Question:   Where should this test be performed    Answer:   [facility]    Order Specific Question:   Perflutren DEFINITY (image enhancing agent) should be administered unless hypersensitivity or allergy exist    Answer:    Administer Perflutren    Order Specific Question:   Reason for exam-Echo    Answer:   Chemo  Z09    Order Specific Question:   Reason for exam-Echo    Answer:   Pericardial effusion I31.3    Order Specific Question:   Other Comments    Answer:   Patient with mediastinal mass thymoma vs lymphoma for chemotherapy planning and to r/o pericardial effusion  Follow-up  ECHO in 3-4 days PET/CT in 5-7 days Phone visit with Dr Irene Limbo in 10 days    The  total time spent in the appointment was 70 minutes*.  All of the patient's questions were answered with apparent satisfaction. The patient knows to call the clinic with any problems, questions or concerns.   Sullivan Lone MD MS AAHIVMS [facility] Hematology/Oncology Physician [facility]  .*Total Encounter Time as defined by the Centers for Medicare and Medicaid Services includes, in addition to the face-to-face time of a patient visit (documented in the note above) non-face-to-face time: obtaining and reviewing outside history, ordering and reviewing medications, tests or procedures, care coordination (communications with other health care professionals or caregivers) and documentation in the medical record.    07/08/2022 2:52 PM

## 2022-07-10 LAB — SURGICAL PATHOLOGY

## 2022-07-13 ENCOUNTER — Ambulatory Visit (HOSPITAL_COMMUNITY)
Admission: RE | Admit: 2022-07-13 | Discharge: 2022-07-13 | Disposition: A | Discharge status: Home / Self Care | Payer: Medicare Other | Source: Ambulatory Visit | Attending: Hematology | Admitting: Hematology

## 2022-07-13 DIAGNOSIS — Z0189 Encounter for other specified special examinations: Secondary | ICD-10-CM | POA: Diagnosis not present

## 2022-07-13 DIAGNOSIS — J9859 Other diseases of mediastinum, not elsewhere classified: Secondary | ICD-10-CM | POA: Insufficient documentation

## 2022-07-13 DIAGNOSIS — I3139 Other pericardial effusion (noninflammatory): Secondary | ICD-10-CM | POA: Insufficient documentation

## 2022-07-13 LAB — ECHOCARDIOGRAM COMPLETE
AR max vel: 3.47 cm2
AV Area VTI: 3.58 cm2
AV Area mean vel: 3.48 cm2
AV Mean grad: 2 mmHg
AV Peak grad: 4 mmHg
Ao pk vel: 1 m/s
Area-P 1/2: 3.65 cm2
Calc EF: 53.1 %
S' Lateral: 3.1 cm
Single Plane A2C EF: 50.3 %
Single Plane A4C EF: 56.3 %

## 2022-07-13 NOTE — Progress Notes (Signed)
  Echocardiogram 2D Echocardiogram has been performed.  Paul [name] 07/13/2022, 9:24 AM

## 2022-07-22 ENCOUNTER — Encounter (HOSPITAL_COMMUNITY)
Admission: RE | Admit: 2022-07-22 | Discharge: 2022-07-22 | Disposition: A | Discharge status: Home / Self Care | Payer: Medicare Other | Source: Ambulatory Visit | Attending: Hematology | Admitting: Hematology

## 2022-07-22 DIAGNOSIS — J9859 Other diseases of mediastinum, not elsewhere classified: Secondary | ICD-10-CM | POA: Insufficient documentation

## 2022-07-22 DIAGNOSIS — C8592 Non-Hodgkin lymphoma, unspecified, intrathoracic lymph nodes: Secondary | ICD-10-CM | POA: Diagnosis present

## 2022-07-22 LAB — GLUCOSE, CAPILLARY: Glucose-Capillary: 106 mg/dL — ABNORMAL HIGH (ref 70–99)

## 2022-07-22 MED ORDER — FLUDEOXYGLUCOSE F - 18 (FDG) INJECTION
11.7400 | Freq: Once | INTRAVENOUS | Status: AC
  Administered 2022-07-22: 11.74 via INTRAVENOUS

## 2022-07-28 ENCOUNTER — Inpatient Hospital Stay (HOSPITAL_BASED_OUTPATIENT_CLINIC_OR_DEPARTMENT_OTHER): Payer: Medicare Other | Admitting: Hematology

## 2022-07-28 DIAGNOSIS — C8192 Hodgkin lymphoma, unspecified, intrathoracic lymph nodes: Secondary | ICD-10-CM | POA: Diagnosis not present

## 2022-07-28 NOTE — Progress Notes (Signed)
Marland Kitchen   HEMATOLOGY/ONCOLOGY PHONE NOTE  Date of Service: 07/28/2022  Patient Care Team: Percell Belt, DO as PCP - General (Family Medicine) Scifres, Earlie Server, PA-C (Inactive) (Physician Assistant)  CHIEF COMPLAINTS/PURPOSE OF CONSULTATION:  Newly noted mediastinal mass concerning for lymphoma.  HISTORY OF PRESENTING ILLNESS:  Paul [name] is a wonderful 31 y.o. male who has been referred to [place] by .Lazoff, Shawn P, DO and Dr. Wonda Amis MD for evaluation and management of newly diagnosed mediastinal mass concerning for likely lymphoma.  Patient has a history of fetal alcohol syndrome with developmental delay, narcolepsy, ADD, exercise-induced asthma and lives with his adoptive parents. He recently presented to the hospital on 07/01/2022 with 2-week history of worsening shortness of breath cough and wheezing.  He received his asthma treatment but was still noted to have significant cough.  In the emergency room he had a chest x-ray which showed an mediastinal mass. Subsequent CT chest with contrast on 07/01/2022 showed extensive mediastinal and hilar lymphadenopathy with conglomerate soft tissue mass/adenopathy within the anterior mediastinum measuring 11.2 x 7.9 x 13 cm. Compression of the left brachiocephalic and SVC within the mid though these vascular structures remain patent.  Patient did not have any clinical signs or symptoms of SVC compression syndrome or left upper extremity swelling.  Patient subsequently had a CT of the abdomen and pelvis which showed no acute intra-abdominal or intrapelvic abnormalities. He underwent a CT-guided core needle biopsy of the mediastinal mass by interventional radiology.  The official pathology results from his biopsy are not currently available at the time of this clinic visit. I did call and talk to the pathologist Dr. Donneta Romberg and she notes that this either looks like a lymphoma or thymoma and she is running additional tests to make a final  diagnosis.  Patient's father accompanied him for this visit and his mother who helps make most of the decisions in tandem with his father was present on the phone.  They do not note any unexplained fevers chills night sweats or significant weight loss.  His breathing has been relatively stable since hospital discharge but he is still having some cough.  I confirmed adequacy of tissue sampling with the pathologist and the patient was then started on prednisone to try to help his cough by drinking his mediastinal tumor some and reducing bronchial inflammation.  Patient is unable to provide much information or review of systems on account of his developmental delay issues.  Interval History:  RAHMON [name] is a wonderful 31 y.o. male who is here for continued evaluation and management of newly diagnosed mediastinal mass concerning for likely lymphoma. .I connected with Royal Hawthorn on 07/28/2022 at  8:40 AM EST by telephone visit and verified that I am speaking with the correct person using two identifiers.   Patient was last seen by me on 07/08/2022 and was doing well overall.   Patient was doing well since our last visit. Patient's mother reports his cough is better than before due to steroids. His mother agrees to place a port and start chemotherapy.   I discussed the limitations, risks, security and privacy concerns of performing an evaluation and management service by telemedicine and the availability of in-person appointments. I also discussed with the patient that there may be a patient responsible charge related to this service. The patient expressed understanding and agreed to proceed.   Other persons participating in the visit and their role in the encounter: Mother   Patient's location: Home  Provider's location: [facility]   Chief Complaint: Lymphoma    MEDICAL HISTORY:  Past Medical History:  Diagnosis Date   ADD (attention deficit disorder)    ADHD (attention deficit  hyperactivity disorder)    Asthma    excercise induced and pollen   Auditory processing disorder    Complication of anesthesia    mother states pt. is harder to put under anes. and hard to wake up due to fetal alcohol syndrome; can be combative, per mother; states he will do better if mother is in PACU when he is coming out of anes.   Development delay    states mental status of a 31 year old   Exercise-induced asthma    prn inhaler/neb.   Fetal alcohol syndrome    Narcolepsy    Non-restorable tooth 09/2015   teeth   Separation anxiety    Twin birth     SURGICAL HISTORY: Past Surgical History:  Procedure Laterality Date   MULTIPLE EXTRACTIONS WITH ALVEOLOPLASTY N/A 09/23/2015   Procedure: MULTIPLE EXTRACTION WITH ALVEOLOPLASTY;  Surgeon: Diona Browner, DDS;  Location: [place];  Service: Oral Surgery;  Laterality: N/A;   PYLOROMYOTOMY     age [age], performed found to not have stenosis on surgical exam   PYLOROMYOTOMY     did not have pyloric stenosis; did surgery on the wrong twin   RADIOLOGY WITH ANESTHESIA N/A 01/16/2020   Procedure: MRI WITH ANESTHESIA  BRAIN WITH AND WITHOUT CONTRAST;  Surgeon: Radiologist, Medication, MD;  Location: [place];  Service: Radiology;  Laterality: N/A;    SOCIAL HISTORY: Social History   Socioeconomic History   Marital status: Single    Spouse name: Not on file   Number of children: Not on file   Years of education: Not on file   Highest education level: Not on file  Occupational History   Not on file  Tobacco Use   Smoking status: Never   Smokeless tobacco: Never  Substance and Sexual Activity   Alcohol use: No   Drug use: No   Sexual activity: Not on file  Other Topics Concern   Not on file  Social History Narrative   ** Merged History Encounter **       Social Determinants of Health   Financial Resource Strain: Not on file  Food Insecurity: Not on file  Transportation Needs: Not on file  Physical Activity: Not  on file  Stress: Not on file  Social Connections: Not on file  Intimate Partner Violence: Not on file    FAMILY HISTORY: Family History  Adopted: Yes  Problem Relation Age of Onset   Mental illness Mother    Diabetes Mother    Hypertension Mother    Schizophrenia Mother    Bipolar disorder Mother    Mental retardation Father     ALLERGIES:  is allergic to other.  MEDICATIONS:  Current Outpatient Medications  Medication Sig Dispense Refill   albuterol (PROVENTIL HFA;VENTOLIN HFA) 108 (90 BASE) MCG/ACT inhaler Inhale into the lungs every 6 (six) hours as needed for wheezing or shortness of breath.     albuterol (PROVENTIL) (5 MG/ML) 0.5% nebulizer solution Take 2.5 mg by nebulization every 6 (six) hours as needed for wheezing or shortness of breath.     Cholecalciferol (VITAMIN D3) 250 MCG (10000 UT) capsule Take 10,000 Units by mouth daily.     loratadine (CLARITIN) 10 MG tablet Take 10 mg by mouth daily.     mometasone-formoterol (DULERA) 200-5 MCG/ACT AERO Inhale 2  puffs into the lungs 2 (two) times daily. 8.8 g 0   Multiple Vitamin (MULTIVITAMIN WITH MINERALS) TABS tablet Take 1 tablet by mouth daily.     ondansetron (ZOFRAN) 4 MG tablet Take 1 tablet (4 mg total) by mouth every 6 (six) hours as needed for nausea. (Patient not taking: Reported on 07/08/2022) 20 tablet 0   No current facility-administered medications for this visit.    REVIEW OF SYSTEMS:    10 Point review of Systems was done is negative except as noted above.  PHONE VISIT  LABORATORY DATA:  I have reviewed the data as listed  .    Latest Ref Rng & Units 07/01/2022    1:57 PM 09/10/2015    2:36 PM  CBC  WBC 4.0 - 10.5 K/uL 6.6  5.8   Hemoglobin 13.0 - 17.0 g/dL 14.4  14.9   Hematocrit 39.0 - 52.0 % 41.6  41.8   Platelets 150 - 400 K/uL 266  263     .    Latest Ref Rng & Units 07/01/2022    1:57 PM  CMP  Glucose 70 - 99 mg/dL 100   BUN 6 - 20 mg/dL 11   Creatinine 0.61 - 1.24 mg/dL 0.94    Sodium 135 - 145 mmol/L 138   Potassium 3.5 - 5.1 mmol/L 3.9   Chloride 98 - 111 mmol/L 102   CO2 22 - 32 mmol/L 28   Calcium 8.9 - 10.3 mg/dL 9.6   Total Protein 6.5 - 8.1 g/dL 7.5   Total Bilirubin 0.3 - 1.2 mg/dL 0.5   Alkaline Phos 38 - 126 U/L 113   AST 15 - 41 U/L 22   ALT 0 - 44 U/L 31      RADIOGRAPHIC STUDIES: I have personally reviewed the radiological images as listed and agreed with the findings in the report. NM PET Image Initial (PI) Skull Base To Thigh  Result Date: 07/23/2022 CLINICAL DATA:  Initial treatment strategy for anterior mediastinal mass on CT. EXAM: NUCLEAR MEDICINE PET SKULL BASE TO THIGH TECHNIQUE: 11.7 mCi F-18 FDG was injected intravenously. Full-ring PET imaging was performed from the skull base to thigh after the radiotracer. CT data was obtained and used for attenuation correction and anatomic localization. Fasting blood glucose: 106 mg/dl COMPARISON:  Abdominopelvic CT 07/03/2022. Chest CT 07/01/2022. biopsy results 07/03/2022 demonstrating classical Hodgkin's lymphoma. FINDINGS: Mediastinal blood pool activity: SUV max 2.5 Liver activity: SUV max 3.8 NECK: No areas of abnormal hypermetabolism. Incidental CT findings: No cervical adenopathy. CHEST: High left paratracheal node measures 1.7 cm and a S.U.V. max of 7.8 on 51/4. The dominant anterior mediastinal mass demonstrates heterogeneous hypermetabolism with some areas of presumed necrosis. On the order of 12.8 by 7.4 cm and a S.U.V. max of 7.7 on 65/4. Right hilar hypermetabolic nodal mass measures 2.4 cm on 65/2 of the 07/01/2022 CT and a S.U.V. max of 17.4 today. No pulmonary parenchymal hypermetabolism. Incidental CT findings: Deferred to recent diagnostic CT. ABDOMEN/PELVIS: No abdominopelvic parenchymal or nodal hypermetabolism. Incidental CT findings: Deferred to recent diagnostic CT. No abdominopelvic adenopathy. No splenomegaly. SKELETON: No abnormal marrow activity. Incidental CT findings: None.  IMPRESSION: Hypermetabolic lymphoma within the mediastinum and right hilum, as detailed above. ([place]) 5 Electronically Signed   By: Abigail Miyamoto M.D.   On: 07/23/2022 11:53   ECHOCARDIOGRAM COMPLETE  Result Date: 07/13/2022    ECHOCARDIOGRAM REPORT   Patient Name:   PEDRAM GOODCHILD Date of Exam: 07/13/2022 Medical Rec #:  [id number]  Height:       68.0 in Accession #:    [id number]      Weight:       235.1 lb Date of Birth:  27-Apr-1991       BSA:          2.189 m Patient Age:    31 years        BP:           122/79 mmHg Patient Gender: M               HR:           84 bpm. Exam Location:  Outpatient Procedure: 2D Echo and Strain Analysis Indications:    Chemo; pericardial effusion  History:        Patient has prior history of Echocardiogram examinations, most                 recent 01/20/2014.  Sonographer:    Harvie Junior Referring Phys: [id number] Brunetta Genera  Sonographer Comments: Global longitudinal strain was attempted. IMPRESSIONS  1. Left ventricular ejection fraction, by estimation, is 55 to 60%. The left ventricle has normal function. The left ventricle has no regional wall motion abnormalities. Left ventricular diastolic parameters were normal.  2. Right ventricular systolic function is normal. The right ventricular size is normal.  3. The mitral valve is normal in structure. Trivial mitral valve regurgitation.  4. The aortic valve is tricuspid. Aortic valve regurgitation is not visualized.  5. The inferior vena cava is normal in size with greater than 50% respiratory variability, suggesting right atrial pressure of 3 mmHg. FINDINGS  Left Ventricle: Left ventricular ejection fraction, by estimation, is 55 to 60%. The left ventricle has normal function. The left ventricle has no regional wall motion abnormalities. The left ventricular internal cavity size was normal in size. There is  no left ventricular hypertrophy. Left ventricular diastolic parameters were normal. Right Ventricle: The  right ventricular size is normal. Right vetricular wall thickness was not assessed. Right ventricular systolic function is normal. Left Atrium: Left atrial size was normal in size. Right Atrium: Right atrial size was normal in size. Pericardium: There is no evidence of pericardial effusion. Mitral Valve: The mitral valve is normal in structure. Trivial mitral valve regurgitation. Tricuspid Valve: The tricuspid valve is normal in structure. Tricuspid valve regurgitation is trivial. Aortic Valve: The aortic valve is tricuspid. Aortic valve regurgitation is not visualized. Aortic valve mean gradient measures 2.0 mmHg. Aortic valve peak gradient measures 4.0 mmHg. Aortic valve area, by VTI measures 3.58 cm. Pulmonic Valve: The pulmonic valve was normal in structure. Pulmonic valve regurgitation is not visualized. Aorta: The aortic root and ascending aorta are structurally normal, with no evidence of dilitation. Venous: The inferior vena cava is normal in size with greater than 50% respiratory variability, suggesting right atrial pressure of 3 mmHg. IAS/Shunts: No atrial level shunt detected by color flow Doppler.  LEFT VENTRICLE PLAX 2D LVIDd:         4.20 cm      Diastology LVIDs:         3.10 cm      LV e' medial:    10.40 cm/s LV PW:         0.90 cm      LV E/e' medial:  7.3 LV IVS:        0.90 cm      LV e' lateral:   14.30 cm/s LVOT diam:  2.20 cm      LV E/e' lateral: 5.3 LV SV:         61 LV SV Index:   28 LVOT Area:     3.80 cm  LV Volumes (MOD) LV vol d, MOD A2C: 96.5 ml LV vol d, MOD A4C: 102.0 ml LV vol s, MOD A2C: 48.0 ml LV vol s, MOD A4C: 44.6 ml LV SV MOD A2C:     48.5 ml LV SV MOD A4C:     102.0 ml LV SV MOD BP:      56.7 ml RIGHT VENTRICLE RV Basal diam:  3.30 cm RV Mid diam:    2.90 cm RV S prime:     13.10 cm/s TAPSE (M-mode): 2.2 cm LEFT ATRIUM             Index        RIGHT ATRIUM           Index LA diam:        3.10 cm 1.42 cm/m   RA Area:     11.50 cm LA Vol (A2C):   39.9 ml 18.23 ml/m  RA  Volume:   25.10 ml  11.47 ml/m LA Vol (A4C):   28.9 ml 13.20 ml/m LA Biplane Vol: 34.2 ml 15.62 ml/m  AORTIC VALVE                    PULMONIC VALVE AV Area (Vmax):    3.47 cm     PV Vmax:       1.05 m/s AV Area (Vmean):   3.48 cm     PV Peak grad:  4.4 mmHg AV Area (VTI):     3.58 cm AV Vmax:           100.00 cm/s AV Vmean:          66.500 cm/s AV VTI:            0.170 m AV Peak Grad:      4.0 mmHg AV Mean Grad:      2.0 mmHg LVOT Vmax:         91.30 cm/s LVOT Vmean:        60.800 cm/s LVOT VTI:          0.160 m LVOT/AV VTI ratio: 0.94  AORTA Ao Root diam: 3.60 cm Ao Asc diam:  3.10 cm MITRAL VALVE MV Area (PHT): 3.65 cm    SHUNTS MV Decel Time: 208 msec    Systemic VTI:  0.16 m MV E velocity: 75.40 cm/s  Systemic Diam: 2.20 cm MV A velocity: 68.10 cm/s MV E/A ratio:  1.11 Dorris Carnes MD Electronically signed by Dorris Carnes MD Signature Date/Time: 07/13/2022/5:14:57 PM    Final    CT ABDOMEN PELVIS W CONTRAST  Result Date: 07/03/2022 CLINICAL DATA:  Lung mass, lymphadenopathy EXAM: CT ABDOMEN AND PELVIS WITH CONTRAST TECHNIQUE: Multidetector CT imaging of the abdomen and pelvis was performed using the standard protocol following bolus administration of intravenous contrast. RADIATION DOSE REDUCTION: This exam was performed according to the departmental dose-optimization program which includes automated exposure control, adjustment of the mA and/or kV according to patient size and/or use of iterative reconstruction technique. CONTRAST:  73m OMNIPAQUE IOHEXOL 350 MG/ML SOLN IV. Dilute oral contrast. COMPARISON:  CT chest 07/01/2022 FINDINGS: Lower chest: Lung bases clear. Abnormal soft tissue anterior to RIGHT atrium and RIGHT ventricle, see recent CT chest. Hepatobiliary: Gallbladder and liver normal appearance. Pancreas: Normal appearance Spleen: Normal size and  appearance Adrenals/Urinary Tract: Adrenal glands normal appearance. LEFT kidney normal appearance. Small cyst posterior mid RIGHT kidney 1.9 cm  diameter, simple appearance by CT; no follow-up imaging recommended. Kidneys, ureters, and bladder otherwise unremarkable. Stomach/Bowel: Food debris in stomach. Normal appendix. Stomach and bowel loops normal appearance. Vascular/Lymphatic: Vascular structures patent.  No adenopathy. Reproductive: Unremarkable prostate gland and seminal vesicles Other: No free air or free fluid. No hernia or inflammatory process. Musculoskeletal: Osseous and muscular structures unremarkable. IMPRESSION: No acute intra-abdominal or intrapelvic abnormalities. Abnormal soft tissue anterior to RIGHT atrium and RIGHT ventricle, see recent CT chest. Electronically Signed   By: Lavonia Dana M.D.   On: 07/03/2022 17:26   CT BIOPSY  Result Date: 07/03/2022 CLINICAL DATA:  Anterior mediastinal mass EXAM: CT GUIDED CORE BIOPSY OF ANTERIOR MEDIASTINAL MASS ANESTHESIA/SEDATION: Intravenous Fentanyl 138mg and Versed '2mg'$  were administered as conscious sedation during continuous monitoring of the patient's level of consciousness and physiological / cardiorespiratory status by the radiology RN, with a total moderate sedation time of 15 minutes. PROCEDURE: The procedure risks, benefits, and alternatives were explained to the patient. Questions regarding the procedure were encouraged and answered. The patient understands and consents to the procedure. Select axial scans through the thorax were obtained. The lesion was localized and an appropriate skin site was determined and marked. The operative field was prepped with chlorhexidinein a sterile fashion, and a sterile drape was applied covering the operative field. A sterile gown and sterile gloves were used for the procedure. Local anesthesia was provided with 1% Lidocaine. Under in guidance, a 189gauge trocar needle was advanced to the margin of the lesion taking care to avoid internal mammary vessels. Once needle tip position was confirmed, coaxial 18-gauge core biopsy samples were obtained,  submitted in saline to surgical pathology. The guide needle was removed. Postprocedure scans show no hemorrhage, pneumothorax, or other apparent complication. The patient tolerated the procedure well. RADIATION DOSE REDUCTION: This exam was performed according to the departmental dose-optimization program which includes automated exposure control, adjustment of the mA and/or kV according to patient size and/or use of iterative reconstruction technique. COMPLICATIONS: None immediate FINDINGS: Soft tissue attenuation anterior mediastinal mass was localized. Representative core biopsy samples obtained as above. IMPRESSION: Technically successful CT-guided core biopsy, anterior mediastinal mass. Electronically Signed   By: DLucrezia EuropeM.D.   On: 07/03/2022 13:19   CT Chest W Contrast  Result Date: 07/01/2022 CLINICAL DATA:  Abnormal chest radiograph, anterior mediastinal mass EXAM: CT CHEST WITH CONTRAST TECHNIQUE: Multidetector CT imaging of the chest was performed during intravenous contrast administration. RADIATION DOSE REDUCTION: This exam was performed according to the departmental dose-optimization program which includes automated exposure control, adjustment of the mA and/or kV according to patient size and/or use of iterative reconstruction technique. CONTRAST:  743mOMNIPAQUE IOHEXOL 300 MG/ML  SOLN IV COMPARISON:  Chest radiograph 07/01/2022 FINDINGS: Cardiovascular: Vascular structures patent. Compression of LEFT brachiocephalic vein and SVC within the mediastinum. Aorta normal caliber. Heart size normal. No pericardial effusion. Mediastinum/Nodes: Esophagus unremarkable. Base of cervical region normal appearance. Axilla clear bilaterally. Extensive mediastinal and to lesser degree hilar adenopathy. Conglomerate soft tissue mass/adenopathy within anterior mediastinum measuring 11.2 cm transverse, 7.9 cm AP, and 13 cm length. Additional adenopathy at AP window where a 27 mm short axis node is seen. LEFT  retro hilar node 25 mm. RIGHT hilar node 21 mm. RIGHT paratracheal node 20 mm. None of the observed nodes are calcified or grossly necrotic. Lungs/Pleura: Lungs clear. No pulmonary infiltrate, pleural  effusion, or pneumothorax. Upper Abdomen: Respiratory motion artifacts significantly degrade upper abdomen assessment. Probable cyst posteriorly at upper RIGHT kidney but incompletely evaluated. Spleen normal size. No other gross abnormality seen. Musculoskeletal: No osseous abnormalities. IMPRESSION: Extensive mediastinal and to lesser degree hilar adenopathy with conglomerate soft tissue mass/adenopathy within anterior mediastinum measuring 11.2 x 7.9 x 13 cm. Differential diagnosis includes lymphoma, metastatic disease, Castleman disease, reactive process considered less likely. Other processes such as thymoma, teratoma, and thyroid tissue/tumor can involve the anterior mediastinum though these are considered less likely and lymphadenopathy is preferred. Consider either PET-CT imaging or tissue diagnosis. Compression of LEFT brachiocephalic vein and SVC within the mediastinum though the vascular structures remain patent. Electronically Signed   By: Lavonia Dana M.D.   On: 07/01/2022 15:38   DG Chest 2 View  Result Date: 07/01/2022 CLINICAL DATA:  Shortness of breath for 1 month.  History of asthma. EXAM: CHEST - 2 VIEW COMPARISON:  None Available. FINDINGS: Widening of the superior mediastinum with obscuration of the retrosternal space on lateral view, consistent with an anterior mediastinal mass. The heart is normal in size. No focal consolidation, pleural effusion, or pneumothorax. No acute osseous abnormality. IMPRESSION: Anterior mediastinal mass. Recommend CT chest with intravenous contrast for further evaluation. Lungs are clear. Electronically Signed   By: Ileana Roup M.D.   On: 07/01/2022 13:24    Surgical Pathology result from 07/03/2022: FINAL MICROSCOPIC DIAGNOSIS:  A. MEDIASTINAL MASS,  ANTERIOR, NEEDLE CORE BIOPSY: -Atypical lymphoid proliferation consistent with classical Hodgkin lymphoma -See comment  COMMENT:  The sections show needle core biopsy fragments displaying a nodular and diffuse lymphoid proliferation associated with dense sclerosis.  The lymphoid process shows predominance of small lymphoid cells admixed to a lesser extent with histiocytes, eosinophils in addition to the large atypical mononuclear and occasionally lobated lymphoid appearing cells with variably prominent nucleoli.  Flow cytometric analysis was performed [facility] [id number]) and shows predominance of CD4-positive T cells. No monoclonal B-cell population identified.  In addition, a battery of immunohistochemical stains was performed including CD30, CD15, mum 1, LCA, CD20, PAX5, CD3, CD5, CD4, CD8, TdT, cytokeratin AE1/AE3, cytokeratin 8/18 and EBV in situ hybridization with appropriate controls.  The large atypical lymphoid appearing cells are positive for CD15, CD30, PAX5, mum 1 and rare cells for CD20.  No significant staining is seen with LCA, EBV, CD3, CD5, CD4, CD8.  Cytokeratin stains highlight a small focus of positivity likely representing native epithelial elements.  No significant TdT positivity is identified.  The small lymphocytes in the background show a mixture of T and B cells with predominance of T cells.  The latter show predominance of CD4 positive cells.  Overall, the features are atypical and most consistent with classical Hodgkin lymphoma which is best subclassified as nodular sclerosis type.   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  Less likely thymoma. Given the patient's age most likely differential would include Hodgkin's lymphoma and  primary mediastinal large B-cell lymphoma. The mass is causing some compression of his SVC and left brachiocephalic vein but no complete obstruction or symptoms related to this. He does not note normal his parents any obvious constitutional symptoms.  Plan: -Patient is agreeable to place a Port.  -Discussed PET scan, ECHO, and pathology results with the patient and his mom.  -Will  start chemotherapy and patient and family members needs chemo education at home.  -ABVD chemotherapy discussed  Follow-up: Chemo-counseling for ABVD Port a cath placement by IR PFts Plan to start chemotherapy in 2-3 weeks  The total time spent in the appointment was 30 minutes* .  All of the patient's questions were answered with apparent satisfaction. The patient knows to call the clinic with any problems, questions or concerns.   Sullivan Lone MD MS AAHIVMS [facility] Hematology/Oncology Physician [facility]  .*Total Encounter Time as defined by the Centers for Medicare and Medicaid Services includes, in addition to the face-to-face time of a patient visit (documented in the note above) non-face-to-face time: obtaining and reviewing outside history, ordering and reviewing medications, tests or procedures, care coordination (communications with other health care professionals or caregivers) and documentation in the medical record.   Zettie Cooley, am acting as a Education administrator for Sullivan Lone, MD.

## 2022-08-04 ENCOUNTER — Encounter: Payer: Self-pay | Admitting: Hematology

## 2022-08-04 DIAGNOSIS — C8192 Hodgkin lymphoma, unspecified, intrathoracic lymph nodes: Secondary | ICD-10-CM | POA: Insufficient documentation

## 2022-08-04 MED ORDER — LIDOCAINE-PRILOCAINE 2.5-2.5 % EX CREA: TOPICAL_CREAM | CUTANEOUS | 3 refills | Status: DC

## 2022-08-04 MED ORDER — PROCHLORPERAZINE MALEATE 10 MG PO TABS: 10.0000 mg | ORAL_TABLET | Freq: Four times a day (QID) | ORAL | 1 refills | Status: DC | PRN

## 2022-08-04 MED ORDER — DEXAMETHASONE 4 MG PO TABS: ORAL_TABLET | ORAL | 1 refills | Status: DC

## 2022-08-04 NOTE — Progress Notes (Signed)
START ON PATHWAY REGIMEN - Lymphoma and CLL     A cycle is every 28 days:     Bleomycin      Dacarbazine      Doxorubicin      Vinblastine   **Always confirm dose/schedule in your pharmacy ordering system**  Patient Characteristics: Classic Hodgkin Lymphoma, First Line, Stage I / II, Early Unfavorable with Bulky Mediastinal Disease (10 cm or > 1/3 Diameter of Chest) Disease Type: Not Applicable Disease Type: Not Applicable Disease Type: Classic Hodgkin Lymphoma Line of therapy: First Line First Line, Stage I/II Disease Characteristics: Early Unfavorable with Bulky Mediastinal Disease (10 cm or > 1/3 Diameter of Chest) Intent of Therapy: Curative Intent, Discussed with Patient

## 2022-08-04 NOTE — Addendum Note (Signed)
Addended by: Sullivan Lone on: 08/04/2022 02:56 PM   Modules accepted: Orders

## 2022-08-05 ENCOUNTER — Other Ambulatory Visit: Payer: Self-pay

## 2022-08-05 NOTE — Progress Notes (Signed)
Pharmacist Chemotherapy Monitoring - Initial Assessment    Anticipated start date: 08/12/22   The following has been reviewed per standard work regarding the patient's treatment regimen: The patient's diagnosis, treatment plan and drug doses, and organ/hematologic function Lab orders and baseline tests specific to treatment regimen  The treatment plan start date, drug sequencing, and pre-medications Prior authorization status  Patient's documented medication list, including drug-drug interaction screen and prescriptions for anti-emetics and supportive care specific to the treatment regimen The drug concentrations, fluid compatibility, administration routes, and timing of the medications to be used The patient's access for treatment and lifetime cumulative dose history, if applicable  The patient's medication allergies and previous infusion related reactions, if applicable   Changes made to treatment plan:  N/A  Follow up needed:  N/A   Larene Beach, [place], 08/05/2022  2:32 PM

## 2022-08-10 ENCOUNTER — Other Ambulatory Visit: Payer: Self-pay | Admitting: Radiology

## 2022-08-10 ENCOUNTER — Other Ambulatory Visit: Payer: Self-pay

## 2022-08-10 ENCOUNTER — Inpatient Hospital Stay: Payer: Medicare Other | Attending: Hematology

## 2022-08-10 DIAGNOSIS — C8170 Other classical Hodgkin lymphoma, unspecified site: Secondary | ICD-10-CM | POA: Insufficient documentation

## 2022-08-10 DIAGNOSIS — Z5111 Encounter for antineoplastic chemotherapy: Secondary | ICD-10-CM | POA: Insufficient documentation

## 2022-08-10 DIAGNOSIS — J9859 Other diseases of mediastinum, not elsewhere classified: Secondary | ICD-10-CM | POA: Insufficient documentation

## 2022-08-10 NOTE — H&P (Signed)
Referring Physician(s): Brunetta Genera  Supervising Physician: Aletta Edouard  Patient Status:  Paul [name] OP  Chief Complaint:  "I'm here for a port a cath"  Subjective: Patient known to IR service from anterior mediastinal mass biopsy on 07/03/2022.  He has a past medical history of ADHD,  exercise-induced asthma, fetal alcohol syndrome with developmental delay, narcolepsy, anxiety and now with newly diagnosed Hodgkin's lymphoma.  He presents today for Port-A-Cath placement to assist with treatment.     Past Medical History:  Diagnosis Date   ADD (attention deficit disorder)    ADHD (attention deficit hyperactivity disorder)    Asthma    excercise induced and pollen   Auditory processing disorder    Complication of anesthesia    mother states pt. is harder to put under anes. and hard to wake up due to fetal alcohol syndrome; can be combative, per mother; states he will do better if mother is in PACU when he is coming out of anes.   Development delay    states mental status of a 31 year old   Exercise-induced asthma    prn inhaler/neb.   Fetal alcohol syndrome    Narcolepsy    Non-restorable tooth 09/2015   teeth   Separation anxiety    Twin birth    Past Surgical History:  Procedure Laterality Date   MULTIPLE EXTRACTIONS WITH ALVEOLOPLASTY N/A 09/23/2015   Procedure: MULTIPLE EXTRACTION WITH ALVEOLOPLASTY;  Surgeon: Diona Browner, DDS;  Location: [place];  Service: Oral Surgery;  Laterality: N/A;   PYLOROMYOTOMY     age [age], performed found to not have stenosis on surgical exam   PYLOROMYOTOMY     did not have pyloric stenosis; did surgery on the wrong twin   RADIOLOGY WITH ANESTHESIA N/A 01/16/2020   Procedure: MRI WITH ANESTHESIA  BRAIN WITH AND WITHOUT CONTRAST;  Surgeon: Radiologist, Medication, MD;  Location: [place];  Service: Radiology;  Laterality: N/A;      Allergies: Other  Medications: Prior to Admission medications   Medication  Sig Start Date End Date Taking? Authorizing Provider  albuterol (PROVENTIL HFA;VENTOLIN HFA) 108 (90 BASE) MCG/ACT inhaler Inhale into the lungs every 6 (six) hours as needed for wheezing or shortness of breath.    [provider]  albuterol (PROVENTIL) (5 MG/ML) 0.5% nebulizer solution Take 2.5 mg by nebulization every 6 (six) hours as needed for wheezing or shortness of breath.    [provider]  Cholecalciferol (VITAMIN D3) 250 MCG (10000 UT) capsule Take 10,000 Units by mouth daily.    [provider]  dexamethasone (DECADRON) 4 MG tablet Take 2 tablets by mouth once a day for 3 days after chemotherapy. Take with food. 08/04/22   Brunetta Genera, MD  lidocaine-prilocaine (EMLA) cream Apply to affected area once 08/04/22   Brunetta Genera, MD  loratadine (CLARITIN) 10 MG tablet Take 10 mg by mouth daily.    [provider]  mometasone-formoterol (DULERA) 200-5 MCG/ACT AERO Inhale 2 puffs into the lungs 2 (two) times daily. 07/03/22   Pokhrel, Corrie Mckusick, MD  Multiple Vitamin (MULTIVITAMIN WITH MINERALS) TABS tablet Take 1 tablet by mouth daily.    [provider]  ondansetron (ZOFRAN) 4 MG tablet Take 1 tablet (4 mg total) by mouth every 6 (six) hours as needed for nausea. Patient not taking: Reported on 07/08/2022 07/03/22   Flora Lipps, MD  prochlorperazine (COMPAZINE) 10 MG tablet Take 1 tablet (10 mg total) by mouth every 6 (six) hours as needed  for nausea or vomiting. 08/04/22   Brunetta Genera, MD     Vital Signs:   Physical Exam  Imaging: No results found.  Labs:  CBC: Recent Labs    07/01/22 1357  WBC 6.6  HGB 14.4  HCT 41.6  PLT 266    COAGS: No results for input(s): "INR", "APTT" in the last 8760 hours.  BMP: Recent Labs    07/01/22 1357  NA 138  K 3.9  CL 102  CO2 28  GLUCOSE 100*  BUN 11  CALCIUM 9.6  CREATININE 0.94  GFRNONAA >60    LIVER FUNCTION TESTS: Recent Labs    07/01/22 1357   BILITOT 0.5  AST 22  ALT 31  ALKPHOS 113  PROT 7.5  ALBUMIN 4.6    Assessment and Plan: Patient known to IR service from anterior mediastinal mass biopsy on 07/03/2022.  He has a past medical history of ADHD,  exercise-induced asthma, fetal alcohol syndrome with developmental delay, narcolepsy, anxiety and now with newly diagnosed Hodgkin's lymphoma.  He presents today for Port-A-Cath placement to assist with treatment.Risks and benefits of image guided port-a-catheter placement was discussed with the patient including, but not limited to bleeding, infection, pneumothorax, or fibrin sheath development and need for additional procedures.  All of the patient's questions were answered, patient is agreeable to proceed. Consent signed and in chart.    Electronically Signed: D. Rowe Robert, PA-C 08/10/2022, 4:20 PM   I spent a total of 25 minutes at the the patient's bedside AND on the patient's hospital floor or unit, greater than 50% of which was counseling/coordinating care for Port-A-Cath placement

## 2022-08-11 ENCOUNTER — Ambulatory Visit (HOSPITAL_COMMUNITY)
Admission: RE | Admit: 2022-08-11 | Discharge: 2022-08-11 | Disposition: A | Discharge status: Home / Self Care | Payer: Medicare Other | Source: Ambulatory Visit | Attending: Hematology | Admitting: Hematology

## 2022-08-11 ENCOUNTER — Other Ambulatory Visit: Payer: Self-pay

## 2022-08-11 ENCOUNTER — Encounter (HOSPITAL_COMMUNITY): Payer: Self-pay

## 2022-08-11 DIAGNOSIS — C8192 Hodgkin lymphoma, unspecified, intrathoracic lymph nodes: Secondary | ICD-10-CM | POA: Diagnosis not present

## 2022-08-11 DIAGNOSIS — J45990 Exercise induced bronchospasm: Secondary | ICD-10-CM | POA: Insufficient documentation

## 2022-08-11 DIAGNOSIS — G47419 Narcolepsy without cataplexy: Secondary | ICD-10-CM | POA: Insufficient documentation

## 2022-08-11 HISTORY — PX: IR IMAGING GUIDED PORT INSERTION: IMG5740

## 2022-08-11 MED ORDER — FENTANYL CITRATE (PF) 100 MCG/2ML IJ SOLN
INTRAMUSCULAR | Status: AC
  Filled 2022-08-11: qty 2

## 2022-08-11 MED ORDER — HEPARIN SOD (PORK) LOCK FLUSH 100 UNIT/ML IV SOLN
INTRAVENOUS | Status: AC
  Administered 2022-08-11: 500 [IU] via INTRAVENOUS
  Filled 2022-08-11: qty 5

## 2022-08-11 MED ORDER — MIDAZOLAM HCL 2 MG/2ML IJ SOLN
INTRAMUSCULAR | Status: AC
  Filled 2022-08-11: qty 2

## 2022-08-11 MED ORDER — LIDOCAINE HCL 1 % IJ SOLN
INTRAMUSCULAR | Status: AC
  Administered 2022-08-11: 13 mL via INTRADERMAL
  Filled 2022-08-11: qty 20

## 2022-08-11 MED ORDER — MIDAZOLAM HCL 2 MG/2ML IJ SOLN
INTRAMUSCULAR | Status: AC | PRN
  Administered 2022-08-11 (×3): 1 mg via INTRAVENOUS

## 2022-08-11 MED ORDER — FENTANYL CITRATE (PF) 100 MCG/2ML IJ SOLN
INTRAMUSCULAR | Status: AC | PRN
  Administered 2022-08-11 (×4): 50 ug via INTRAVENOUS

## 2022-08-11 MED ORDER — SODIUM CHLORIDE 0.9 % IV SOLN: INTRAVENOUS | Status: DC

## 2022-08-11 MED FILL — Dexamethasone Sodium Phosphate Inj 100 MG/10ML: INTRAMUSCULAR | Qty: 1 | Status: AC

## 2022-08-11 MED FILL — Fosaprepitant Dimeglumine For IV Infusion 150 MG (Base Eq): INTRAVENOUS | Qty: 5 | Status: AC

## 2022-08-11 NOTE — Discharge Instructions (Addendum)
For questions /concerns may call Interventional Radiology at [phone] or  Interventional Radiology clinic [phone]   You may remove your dressing and shower tomorrow afternoon  DO NOT use EMLA cream for 2 weeks after port placement as the cream will remove surgical glue on your incision.     Moderate Conscious Sedation, Adult, Care After This sheet gives you information about how to care for yourself after your procedure. Your health care provider may also give you more specific instructions. If you have problems or questions, contact your health careprovider. What can I expect after the procedure? After the procedure, it is common to have: Sleepiness for several hours. Impaired judgment for several hours. Difficulty with balance. Vomiting if you eat too soon. Follow these instructions at home: For the time period you were told by your health care provider: Rest. Do not participate in activities where you could fall or become injured. Do not drive or use machinery. Do not drink alcohol. Do not take sleeping pills or medicines that cause drowsiness. Do not make important decisions or sign legal documents. Do not take care of children on your own. Eating and drinking  Follow the diet recommended by your health care provider. Drink enough fluid to keep your urine pale yellow. If you vomit: Drink water, juice, or soup when you can drink without vomiting. Make sure you have little or no nausea before eating solid foods.  General instructions Take over-the-counter and prescription medicines only as told by your health care provider. Have a responsible adult stay with you for the time you are told. It is important to have someone help care for you until you are awake and alert. Do not smoke. Keep all follow-up visits as told by your health care provider. This is important. Contact a health care provider if: You are still sleepy or having trouble with balance after 24  hours. You feel light-headed. You keep feeling nauseous or you keep vomiting. You develop a rash. You have a fever. You have redness or swelling around the IV site. Get help right away if: You have trouble breathing. You have new-onset confusion at home. Summary After the procedure, it is common to feel sleepy, have impaired judgment, or feel nauseous if you eat too soon. Rest after you get home. Know the things you should not do after the procedure. Follow the diet recommended by your health care provider and drink enough fluid to keep your urine pale yellow. Get help right away if you have trouble breathing or new-onset confusion at home. This information is not intended to replace advice given to you by your health care provider. Make sure you discuss any questions you have with your healthcare provider.   Implanted Port Insertion, Care After This sheet gives you information about how to care for yourself after your procedure. Your health care provider may also give you more specific instructions. If you have problems or questions, contact your health careprovider. What can I expect after the procedure? After the procedure, it is common to have: Discomfort at the port insertion site. Bruising on the skin over the port. This should improve over 3-4 days. Follow these instructions at home: Port care After your port is placed, you will get a manufacturer's information card. The card has information about your port. Keep this card with you at all times. Take care of the port as told by your health care provider. Ask your health care provider if you or a family member can get training for   taking care of the port at home. A home health care nurse may also take care of the port. Make sure to remember what type of port you have. Incision care Follow instructions from your health care provider about how to take care of your port insertion site. Make sure you: Wash your hands with soap and  water before and after you change your bandage (dressing). If soap and water are not available, use hand sanitizer. Change your dressing as told by your health care provider. Leave skin glue, or adhesive strips in place. These skin closures may need to stay in place for 2 weeks or longer.  Check your port insertion site every day for signs of infection. Check for:      - Redness, swelling, or pain.                     - Fluid or blood.      - Warmth.      - Pus or a bad smell. Activity Return to your normal activities as told by your health care provider. Ask your health care provider what activities are safe for you. Do not lift anything that is heavier than 10 lb (4.5 kg), or the limit that you are told, until your health care provider says that it is safe. General instructions Take over-the-counter and prescription medicines only as told by your health care provider. Do not take baths, swim, or use a hot tub until your health care provider approves. Ask your health care provider if you may take showers. You may only be allowed to take sponge baths. Do not drive for 24 hours if you were given a sedative during your procedure. Wear a medical alert bracelet in case of an emergency. This will tell any health care providers that you have a port. Keep all follow-up visits as told by your health care provider. This is important. Contact a health care provider if: You cannot flush your port with saline as directed, or you cannot draw blood from the port. You have a fever or chills. You have redness, swelling, or pain around your port insertion site. You have fluid or blood coming from your port insertion site. Your port insertion site feels warm to the touch. You have pus or a bad smell coming from the port insertion site. Get help right away if: You have chest pain or shortness of breath. You have bleeding from your port that you cannot control. Summary Take care of the port as told by your  health care provider. Keep the manufacturer's information card with you at all times. Change your dressing as told by your health care provider. Contact a health care provider if you have a fever or chills or if you have redness, swelling, or pain around your port insertion site. Keep all follow-up visits as told by your health care provider. This information is not intended to replace advice given to you by your health care provider. Make sure you discuss any questions you have with your healthcare provider. Document Revised: 03/22/2018 Document Reviewed: 03/22/2018 

## 2022-08-11 NOTE — Procedures (Signed)
Interventional Radiology Procedure Note  Procedure: Single Lumen Power Port Placement    Access:  Right IJ vein.  Findings: Catheter tip positioned at SVC/RA junction. Port is ready for immediate use.   Complications: None  EBL: < 10 mL  Recommendations:  - Ok to shower in 24 hours - Do not submerge for 7 days - Routine line care   Ethyn Schetter T. Jhalil Silvera, M.D Pager:  [phone]   

## 2022-08-12 ENCOUNTER — Inpatient Hospital Stay: Payer: Medicare Other

## 2022-08-12 ENCOUNTER — Other Ambulatory Visit: Payer: Self-pay

## 2022-08-12 ENCOUNTER — Inpatient Hospital Stay (HOSPITAL_BASED_OUTPATIENT_CLINIC_OR_DEPARTMENT_OTHER): Payer: Medicare Other | Admitting: Hematology

## 2022-08-12 VITALS — BP 103/76 | HR 98 | Temp 97.8°F | Resp 18 | Ht 68.0 in | Wt 228.4 lb

## 2022-08-12 VITALS — BP 107/75 | HR 91 | Temp 97.7°F | Resp 18

## 2022-08-12 DIAGNOSIS — C8192 Hodgkin lymphoma, unspecified, intrathoracic lymph nodes: Secondary | ICD-10-CM

## 2022-08-12 DIAGNOSIS — J9859 Other diseases of mediastinum, not elsewhere classified: Secondary | ICD-10-CM | POA: Diagnosis not present

## 2022-08-12 DIAGNOSIS — Z5111 Encounter for antineoplastic chemotherapy: Secondary | ICD-10-CM | POA: Diagnosis present

## 2022-08-12 DIAGNOSIS — C8170 Other classical Hodgkin lymphoma, unspecified site: Secondary | ICD-10-CM | POA: Diagnosis not present

## 2022-08-12 LAB — CMP (CANCER CENTER ONLY)
ALT: 22 U/L (ref 0–44)
AST: 19 U/L (ref 15–41)
Albumin: 4.1 g/dL (ref 3.5–5.0)
Alkaline Phosphatase: 124 U/L (ref 38–126)
Anion gap: 4 — ABNORMAL LOW (ref 5–15)
BUN: 10 mg/dL (ref 6–20)
CO2: 29 mmol/L (ref 22–32)
Calcium: 9.7 mg/dL (ref 8.9–10.3)
Chloride: 103 mmol/L (ref 98–111)
Creatinine: 0.8 mg/dL (ref 0.61–1.24)
GFR, Estimated: >60 mL/min (ref >60)
Glucose, Bld: 100 mg/dL — ABNORMAL HIGH (ref 70–99)
Potassium: 3.9 mmol/L (ref 3.5–5.1)
Sodium: 136 mmol/L (ref 135–145)
Total Bilirubin: 0.3 mg/dL (ref 0.3–1.2)
Total Protein: 7.4 g/dL (ref 6.5–8.1)

## 2022-08-12 LAB — CBC WITH DIFFERENTIAL (CANCER CENTER ONLY)
Abs Immature Granulocytes: 0.02 10*3/uL (ref 0.00–0.07)
Basophils Absolute: 0 10*3/uL (ref 0.0–0.1)
Basophils Relative: 1 %
Eosinophils Absolute: 0.4 10*3/uL (ref 0.0–0.5)
Eosinophils Relative: 5 %
HCT: 42.5 % (ref 39.0–52.0)
Hemoglobin: 14.5 g/dL (ref 13.0–17.0)
Immature Granulocytes: 0 %
Lymphocytes Relative: 21 %
Lymphs Abs: 1.7 10*3/uL (ref 0.7–4.0)
MCH: 30.6 pg (ref 26.0–34.0)
MCHC: 34.1 g/dL (ref 30.0–36.0)
MCV: 89.7 fL (ref 80.0–100.0)
Monocytes Absolute: 0.7 10*3/uL (ref 0.1–1.0)
Monocytes Relative: 9 %
Neutro Abs: 5.4 10*3/uL (ref 1.7–7.7)
Neutrophils Relative %: 64 %
Platelet Count: 302 10*3/uL (ref 150–400)
RBC: 4.74 MIL/uL (ref 4.22–5.81)
RDW: 12 % (ref 11.5–15.5)
WBC Count: 8.3 10*3/uL (ref 4.0–10.5)
nRBC: 0 % (ref 0.0–0.2)

## 2022-08-12 MED ORDER — SODIUM CHLORIDE 0.9 % IV SOLN
375.0000 mg/m2 | Freq: Once | INTRAVENOUS | Status: AC
  Administered 2022-08-12: 850 mg via INTRAVENOUS
  Filled 2022-08-12: qty 85

## 2022-08-12 MED ORDER — SODIUM CHLORIDE 0.9% FLUSH
10.0000 mL | INTRAVENOUS | Status: DC | PRN
  Administered 2022-08-12: 10 mL via INTRAVENOUS

## 2022-08-12 MED ORDER — HEPARIN SOD (PORK) LOCK FLUSH 100 UNIT/ML IV SOLN
500.0000 [IU] | Freq: Once | INTRAVENOUS | Status: AC | PRN
  Administered 2022-08-12: 500 [IU]

## 2022-08-12 MED ORDER — SODIUM CHLORIDE 0.9 % IV SOLN
150.0000 mg | Freq: Once | INTRAVENOUS | Status: AC
  Administered 2022-08-12: 150 mg via INTRAVENOUS
  Filled 2022-08-12: qty 150

## 2022-08-12 MED ORDER — PALONOSETRON HCL INJECTION 0.25 MG/5ML
0.2500 mg | Freq: Once | INTRAVENOUS | Status: AC
  Administered 2022-08-12: 0.25 mg via INTRAVENOUS

## 2022-08-12 MED ORDER — DOXORUBICIN HCL CHEMO IV INJECTION 2 MG/ML
25.0000 mg/m2 | Freq: Once | INTRAVENOUS | Status: AC
  Administered 2022-08-12: 56 mg via INTRAVENOUS
  Filled 2022-08-12: qty 28

## 2022-08-12 MED ORDER — SODIUM CHLORIDE 0.9 % IV SOLN
10.0000 mg | Freq: Once | INTRAVENOUS | Status: AC
  Administered 2022-08-12: 10 mg via INTRAVENOUS
  Filled 2022-08-12: qty 10

## 2022-08-12 MED ORDER — SODIUM CHLORIDE 0.9 % IV SOLN: Freq: Once | INTRAVENOUS | Status: AC

## 2022-08-12 MED ORDER — SODIUM CHLORIDE 0.9% FLUSH
10.0000 mL | INTRAVENOUS | Status: DC | PRN
  Administered 2022-08-12: 10 mL

## 2022-08-12 MED ORDER — VINBLASTINE SULFATE CHEMO INJECTION 1 MG/ML
6.0000 mg/m2 | Freq: Once | INTRAVENOUS | Status: AC
  Administered 2022-08-12: 13.6 mg via INTRAVENOUS
  Filled 2022-08-12: qty 13.6

## 2022-08-12 NOTE — Progress Notes (Signed)
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 male who has been referred to [place] by .Lazoff, Shawn P, DO and Dr. Wonda Amis MD for evaluation and management of newly diagnosed mediastinal mass concerning for likely lymphoma.  Patient has a history of fetal alcohol syndrome with developmental delay, narcolepsy, ADD, exercise-induced asthma and lives with his adoptive parents. He recently presented to the hospital on 07/01/2022 with 2-week history of worsening shortness of breath cough and wheezing.  He received his asthma treatment but was still noted to have significant cough.  In the emergency room he had a chest x-ray which showed an mediastinal mass. Subsequent CT chest with contrast on 07/01/2022 showed extensive mediastinal and hilar lymphadenopathy with conglomerate soft tissue mass/adenopathy within the anterior mediastinum measuring 11.2 x 7.9 x 13 cm. Compression of the left brachiocephalic and SVC within the mid though these vascular structures remain patent.  Patient did not have any clinical signs or symptoms of SVC compression syndrome or left upper extremity swelling.  Patient subsequently had a CT of the abdomen and pelvis which showed no acute intra-abdominal or intrapelvic abnormalities. He underwent a CT-guided core needle biopsy of the mediastinal mass by interventional radiology.  The official pathology results from his biopsy are not currently available at the time of this clinic visit. I did call and talk to the pathologist Dr. Donneta Romberg and she notes that this either looks like a lymphoma or thymoma and she is running additional tests to make a final  diagnosis.  Patient's father accompanied him for this visit and his mother who helps make most of the decisions in tandem with his father was present on the phone.  They do not note any unexplained fevers chills night sweats or significant weight loss.  His breathing has been relatively stable since hospital discharge but he is still having some cough.  I confirmed adequacy of tissue sampling with the pathologist and the patient was then started on prednisone to try to help his cough by drinking his mediastinal tumor some and reducing bronchial inflammation.  Patient is unable to provide much information or review of systems on account of his developmental delay issues.  Interval History:   Paul [name] is a wonderful 31 y.o. male who is here with his mother for continued evaluation and management of newly diagnosed mediastinal mass concerning for likely lymphoma.  I had a phone visit with the patient on 07/28/2022 and he was doing well overall. Patient's mother reported the steroids helped his cough.   Patient is here with his father and reports he has been doing well without any new medical concerns since our last visit. He denies shortness of breath, chest pain, or loss of appetite.   Has not yet had his PFTs so Bleomycin held with treatment today.  MEDICAL HISTORY:  Past Medical History:  Diagnosis Date   ADD (attention deficit disorder)    ADHD (attention deficit hyperactivity disorder)    Asthma    excercise induced and pollen   Auditory processing disorder    Complication of anesthesia    mother states pt. is harder to put under anes. and hard to wake up due to fetal alcohol syndrome; can be combative,  per mother; states he will do better if mother is in PACU when he is coming out of anes.   Development delay    states mental status of a 31 year old   Exercise-induced asthma    prn inhaler/neb.   Fetal alcohol syndrome    Narcolepsy    Non-restorable tooth 09/2015   teeth    Separation anxiety    Twin birth     SURGICAL HISTORY: Past Surgical History:  Procedure Laterality Date   IR IMAGING GUIDED PORT INSERTION  08/11/2022   MULTIPLE EXTRACTIONS WITH ALVEOLOPLASTY N/A 09/23/2015   Procedure: MULTIPLE EXTRACTION WITH ALVEOLOPLASTY;  Surgeon: Diona Browner, DDS;  Location: [facility];  Service: Oral Surgery;  Laterality: N/A;   PYLOROMYOTOMY     age [age], performed found to not have stenosis on surgical exam   PYLOROMYOTOMY     did not have pyloric stenosis; did surgery on the wrong twin   RADIOLOGY WITH ANESTHESIA N/A 01/16/2020   Procedure: MRI WITH ANESTHESIA  BRAIN WITH AND WITHOUT CONTRAST;  Surgeon: Radiologist, Medication, MD;  Location: [facility];  Service: Radiology;  Laterality: N/A;    SOCIAL HISTORY: Social History   Socioeconomic History   Marital status: Single    Spouse name: Not on file   Number of children: Not on file   Years of education: Not on file   Highest education level: Not on file  Occupational History   Not on file  Tobacco Use   Smoking status: Never   Smokeless tobacco: Never  Vaping Use   Vaping Use: Never used  Substance and Sexual Activity   Alcohol use: No   Drug use: No   Sexual activity: Not on file  Other Topics Concern   Not on file  Social History Narrative   ** Merged History Encounter **       Social Determinants of Health   Financial Resource Strain: Not on file  Food Insecurity: Not on file  Transportation Needs: Not on file  Physical Activity: Not on file  Stress: Not on file  Social Connections: Not on file  Intimate Partner Violence: Not on file    FAMILY HISTORY: Family History  Adopted: Yes  Problem Relation Age of Onset   Mental illness Mother    Diabetes Mother    Hypertension Mother    Schizophrenia Mother    Bipolar disorder Mother    Mental retardation Father     ALLERGIES:  is allergic to other.  MEDICATIONS:  Current Outpatient Medications  Medication  Sig Dispense Refill   albuterol (PROVENTIL HFA;VENTOLIN HFA) 108 (90 BASE) MCG/ACT inhaler Inhale into the lungs every 6 (six) hours as needed for wheezing or shortness of breath.     albuterol (PROVENTIL) (5 MG/ML) 0.5% nebulizer solution Take 2.5 mg by nebulization every 6 (six) hours as needed for wheezing or shortness of breath.     Cholecalciferol (VITAMIN D3) 250 MCG (10000 UT) capsule Take 10,000 Units by mouth daily.     dexamethasone (DECADRON) 4 MG tablet Take 2 tablets by mouth once a day for 3 days after chemotherapy. Take with food. 30 tablet 1   lidocaine-prilocaine (EMLA) cream Apply to affected area once 30 g 3   loratadine (CLARITIN) 10 MG tablet Take 10 mg by mouth daily.     mometasone-formoterol (DULERA) 200-5 MCG/ACT AERO Inhale 2 puffs into the lungs 2 (two) times daily. 8.8 g 0   Multiple Vitamin (MULTIVITAMIN WITH MINERALS) TABS tablet Take 1 tablet by  mouth daily.     ondansetron (ZOFRAN) 4 MG tablet Take 1 tablet (4 mg total) by mouth every 6 (six) hours as needed for nausea. (Patient not taking: Reported on 07/08/2022) 20 tablet 0   prochlorperazine (COMPAZINE) 10 MG tablet Take 1 tablet (10 mg total) by mouth every 6 (six) hours as needed for nausea or vomiting. 30 tablet 1   No current facility-administered medications for this visit.    REVIEW OF SYSTEMS:    10 Point review of Systems was done is negative except as noted above.  PHYSICAL EXAMINATION  BP 103/76 (BP Location: Left Arm, Patient Position: Sitting)   Pulse 98   Temp 97.8 F (36.6 C) (Temporal)   Resp 18   Ht '5\' 8"'$  (1.727 m)   Wt 228 lb 6.4 oz (103.6 kg)   SpO2 99%   BMI 34.73 kg/m  . GENERAL:alert, in no acute distress and comfortable SKIN: no acute rashes, no significant lesions EYES: conjunctiva are pink and non-injected, sclera anicteric OROPHARYNX: MMM, no exudates, no oropharyngeal erythema or ulceration NECK: supple, no JVD LYMPH:  no palpable lymphadenopathy in the cervical,  axillary or inguinal regions LUNGS: clear to auscultation b/l with normal respiratory effort HEART: regular rate & rhythm ABDOMEN:  normoactive bowel sounds , non tender, not distended. Extremity: no pedal edema PSYCH: alert & oriented x 3 with fluent speech NEURO: no focal motor/sensory deficits   LABORATORY DATA:  I have reviewed the data as listed  .    Latest Ref Rng & Units 08/12/2022   11:46 AM 07/01/2022    1:57 PM 09/10/2015    2:36 PM  CBC  WBC 4.0 - 10.5 K/uL 8.3  6.6  5.8   Hemoglobin 13.0 - 17.0 g/dL 14.5  14.4  14.9   Hematocrit 39.0 - 52.0 % 42.5  41.6  41.8   Platelets 150 - 400 K/uL 302  266  263     .    Latest Ref Rng & Units 08/12/2022   11:46 AM 07/01/2022    1:57 PM  CMP  Glucose 70 - 99 mg/dL 100  100   BUN 6 - 20 mg/dL 10  11   Creatinine 0.61 - 1.24 mg/dL 0.80  0.94   Sodium 135 - 145 mmol/L 136  138   Potassium 3.5 - 5.1 mmol/L 3.9  3.9   Chloride 98 - 111 mmol/L 103  102   CO2 22 - 32 mmol/L 29  28   Calcium 8.9 - 10.3 mg/dL 9.7  9.6   Total Protein 6.5 - 8.1 g/dL 7.4  7.5   Total Bilirubin 0.3 - 1.2 mg/dL 0.3  0.5   Alkaline Phos 38 - 126 U/L 124  113   AST 15 - 41 U/L 19  22   ALT 0 - 44 U/L 22  31    . Lab Results  Component Value Date   LDH 171 07/03/2022   RADIOGRAPHIC STUDIES: I have personally reviewed the radiological images as listed and agreed with the findings in the report. IR IMAGING GUIDED PORT INSERTION  Result Date: 08/11/2022 CLINICAL DATA:  Hodgkin's lymphoma and need for porta cath for chemotherapy. EXAM: IMPLANTED PORT A CATH PLACEMENT WITH ULTRASOUND AND FLUOROSCOPIC GUIDANCE ANESTHESIA/SEDATION: Moderate (conscious) sedation was employed during this procedure. A total of Versed 3.0 mg and Fentanyl 200 mcg was administered intravenously by radiology nursing. Moderate Sedation Time: 32 minutes. The patient's level of consciousness and vital signs were monitored continuously by radiology nursing throughout  the procedure  under my direct supervision. FLUOROSCOPY: 4.0 mGy PROCEDURE: The procedure, risks, benefits, and alternatives were explained to the patient. Questions regarding the procedure were encouraged and answered. The patient understands and consents to the procedure. A time-out was performed prior to initiating the procedure. Ultrasound was utilized to confirm patency of the right internal jugular vein. A permanent ultrasound image was recorded and saved. The right neck and chest were prepped with chlorhexidine in a sterile fashion, and a sterile drape was applied covering the operative field. Maximum barrier sterile technique with sterile gowns and gloves were used for the procedure. Local anesthesia was provided with 1% lidocaine. After creating a small venotomy incision, a 21 gauge needle was advanced into the right internal jugular vein under direct, real-time ultrasound guidance. Ultrasound image documentation was performed. After securing guidewire access, an 8 Fr dilator was placed. A J-wire was kinked to measure appropriate catheter length. A subcutaneous port pocket was then created along the upper chest wall utilizing sharp and blunt dissection. Portable cautery was utilized. The pocket was irrigated with sterile saline. A single lumen power injectable port was chosen for placement. The 8 Fr catheter was tunneled from the port pocket site to the venotomy incision. The port was placed in the pocket. External catheter was trimmed to appropriate length based on guidewire measurement. At the venotomy, an 8 Fr peel-away sheath was placed over a guidewire. The catheter was then placed through the sheath and the sheath removed. Final catheter positioning was confirmed and documented with a fluoroscopic spot image. The port was accessed with a needle and aspirated and flushed with heparinized saline. The access needle was removed. The venotomy and port pocket incisions were closed with subcutaneous 3-0 Monocryl and  subcuticular 4-0 Vicryl. Dermabond was applied to both incisions. COMPLICATIONS: COMPLICATIONS None FINDINGS: After catheter placement, the tip lies at the cavo-atrial junction. The catheter aspirates normally and is ready for immediate use. IMPRESSION: Placement of single lumen port a cath via right internal jugular vein. The catheter tip lies at the cavo-atrial junction. A power injectable port a cath was placed and is ready for immediate use. Electronically Signed   By: Aletta Edouard M.D.   On: 08/11/2022 15:41   NM PET Image Initial (PI) Skull Base To Thigh  Result Date: 07/23/2022 CLINICAL DATA:  Initial treatment strategy for anterior mediastinal mass on CT. EXAM: NUCLEAR MEDICINE PET SKULL BASE TO THIGH TECHNIQUE: 11.7 mCi F-18 FDG was injected intravenously. Full-ring PET imaging was performed from the skull base to thigh after the radiotracer. CT data was obtained and used for attenuation correction and anatomic localization. Fasting blood glucose: 106 mg/dl COMPARISON:  Abdominopelvic CT 07/03/2022. Chest CT 07/01/2022. biopsy results 07/03/2022 demonstrating classical Hodgkin's lymphoma. FINDINGS: Mediastinal blood pool activity: SUV max 2.5 Liver activity: SUV max 3.8 NECK: No areas of abnormal hypermetabolism. Incidental CT findings: No cervical adenopathy. CHEST: High left paratracheal node measures 1.7 cm and a S.U.V. max of 7.8 on 51/4. The dominant anterior mediastinal mass demonstrates heterogeneous hypermetabolism with some areas of presumed necrosis. On the order of 12.8 by 7.4 cm and a S.U.V. max of 7.7 on 65/4. Right hilar hypermetabolic nodal mass measures 2.4 cm on 65/2 of the 07/01/2022 CT and a S.U.V. max of 17.4 today. No pulmonary parenchymal hypermetabolism. Incidental CT findings: Deferred to recent diagnostic CT. ABDOMEN/PELVIS: No abdominopelvic parenchymal or nodal hypermetabolism. Incidental CT findings: Deferred to recent diagnostic CT. No abdominopelvic adenopathy. No  splenomegaly. SKELETON: No abnormal marrow activity.  Incidental CT findings: None. IMPRESSION: Hypermetabolic lymphoma within the mediastinum and right hilum, as detailed above. ([place]) 5 Electronically Signed   By: Abigail Miyamoto M.D.   On: 07/23/2022 11:53    Surgical Pathology result from 07/03/2022: FINAL MICROSCOPIC DIAGNOSIS:  A. MEDIASTINAL MASS, ANTERIOR, NEEDLE CORE BIOPSY: -Atypical lymphoid proliferation consistent with classical Hodgkin lymphoma -See comment  COMMENT:  The sections show needle core biopsy fragments displaying a nodular and diffuse lymphoid proliferation associated with dense sclerosis.  The lymphoid process shows predominance of small lymphoid cells admixed to a lesser extent with histiocytes, eosinophils in addition to the large atypical mononuclear and occasionally lobated lymphoid appearing cells with variably prominent nucleoli.  Flow cytometric analysis was performed [facility] [id number]) and shows predominance of CD4-positive T cells. No monoclonal B-cell population identified.  In addition, a battery of immunohistochemical stains was performed including CD30, CD15, mum 1, LCA, CD20, PAX5, CD3, CD5, CD4, CD8, TdT, cytokeratin AE1/AE3, cytokeratin 8/18 and EBV in situ hybridization with appropriate controls.  The large atypical lymphoid appearing cells are positive for CD15, CD30, PAX5, mum 1 and rare cells for CD20.  No significant staining is seen with LCA, EBV, CD3, CD5, CD4, CD8.  Cytokeratin stains highlight a small focus of positivity likely representing native epithelial elements.  No significant TdT positivity is identified.  The small lymphocytes in the background show a mixture of T and B cells with predominance of T cells.  The latter show predominance of CD4 positive cells.  Overall, the features are atypical and most consistent with classical Hodgkin lymphoma which is best subclassified as nodular sclerosis type.   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  Less likely thymoma. Given the patient's age most likely differential would include Hodgkin's lymphoma and primary mediastinal large B-cell lymphoma. The mass is causing some compression of his SVC and left brachiocephalic vein but no complete obstruction or symptoms related to this. He does not note normal his parents any obvious constitutional symptoms.  Plan: -Discussed lab results from 08/12/2022 with the patient and his father. Stable CBC. CMP stable. PET/CT results discussed. Sed rate hemolyzed- not read. -Schedule lung function testing. My RN has already called to have this done -- hopefully it will be done prior to next treatment otherwise will need to hold Bleomycin again. -hold bleomycin until lung function testing. Only AVD today. -Answered patient's questions and his father's questions regarding his treatment.   Follow-up: Portflush labs and MD/NP visit with next treatment in 2 week for toxicity check Other appointment per integrated scheduling PFTs in 3-5days  The total time spent in the appointment was 30 minutes* .  All of the patient's questions were answered with apparent satisfaction. The patient knows to call the clinic with any problems, questions or concerns.   Sullivan Lone MD MS AAHIVMS [facility] Hematology/Oncology Physician [name]  .*Total Encounter Time as defined by the Centers for Medicare and Medicaid Services includes, in addition to the face-to-face time of a patient visit (documented in the note above) non-face-to-face time: obtaining and reviewing outside history, ordering and reviewing medications, tests or procedures, care coordination (communications with other health care  professionals or caregivers) and documentation in the medical record.   Zettie Cooley, am acting as a Education administrator for Sullivan Lone, MD. .I have  reviewed the above documentation for accuracy and completeness, and I agree with the above. Brunetta Genera MD

## 2022-08-12 NOTE — Addendum Note (Signed)
Addended by: Lavena Stanford C on: 08/12/2022 02:30 PM   Modules accepted: Orders

## 2022-08-12 NOTE — Progress Notes (Signed)
Patient seen by MD today  Vitals are within treatment parameters.  Labs reviewed: and are within treatment parameters."  Per physician team, patient is ready for treatment. Please note that modifications are being made to the treatment plan including Pt will not get Bleo today d/t not having PFT's done at this time

## 2022-08-12 NOTE — Patient Instructions (Addendum)
[facility]  Discharge Instructions: Thank you for choosing [facility] to provide your oncology and hematology care.   If you have a lab appointment with the [facility], please go directly to the [facility] and check in at the registration area.   Wear comfortable clothing and clothing appropriate for easy access to any Portacath or PICC line.   We strive to give you quality time with your provider. You may need to reschedule your appointment if you arrive late (15 or more minutes).  Arriving late affects you and other patients whose appointments are after yours.  Also, if you miss three or more appointments without notifying the office, you may be dismissed from the clinic at the provider's discretion.      For prescription refill requests, have your pharmacy contact our office and allow 72 hours for refills to be completed.    Today you received the following chemotherapy and/or immunotherapy agents: Doxorubicin, Vinblastine, Decarbazine      To help prevent nausea and vomiting after your treatment, we encourage you to take your nausea medication as directed.  BELOW ARE SYMPTOMS THAT SHOULD BE REPORTED IMMEDIATELY: *FEVER GREATER THAN 100.4 F (38 C) OR HIGHER *CHILLS OR SWEATING *NAUSEA AND VOMITING THAT IS NOT CONTROLLED WITH YOUR NAUSEA MEDICATION *UNUSUAL SHORTNESS OF BREATH *UNUSUAL BRUISING OR BLEEDING *URINARY PROBLEMS (pain or burning when urinating, or frequent urination) *BOWEL PROBLEMS (unusual diarrhea, constipation, pain near the anus) TENDERNESS IN MOUTH AND THROAT WITH OR WITHOUT PRESENCE OF ULCERS (sore throat, sores in mouth, or a toothache) UNUSUAL RASH, SWELLING OR PAIN  UNUSUAL VAGINAL DISCHARGE OR ITCHING   Items with * indicate a potential emergency and should be followed up as soon as possible or go to the Emergency Department if any problems should occur.  Please show the CHEMOTHERAPY ALERT CARD or  IMMUNOTHERAPY ALERT CARD at check-in to the Emergency Department and triage nurse.  Should you have questions after your visit or need to cancel or reschedule your appointment, please contact [facility]  Dept: [phone]  and follow the prompts.  Office hours are 8:00 a.m. to 4:30 p.m. Monday - Friday. Please note that voicemails left after 4:00 p.m. may not be returned until the following business day.  We are closed weekends and major holidays. You have access to a nurse at all times for urgent questions. Please call the main number to the clinic Dept: [phone] and follow the prompts.   For any non-urgent questions, you may also contact your provider using MyChart. We now offer e-Visits for anyone 30 and older to request care online for non-urgent symptoms. For details visit [email or web address].   Also download the MyChart app! Go to the app store, search "MyChart", open the app, select [facility], and log in with your MyChart username and password.  Masks are optional in the cancer centers. If you would like for your care team to wear a mask while they are taking care of you, please let them know. You may have one support person who is at least 30 years old accompany you for your appointments. Doxorubicin Injection What is this medication? DOXORUBICIN (dox oh ROO bi sin) treats some types of cancer. It works by slowing down the growth of cancer cells. This medicine may be used for other purposes; ask your health care provider or pharmacist if you have questions. COMMON BRAND NAME(S): Adriamycin, Adriamycin PFS, Adriamycin RDF, Rubex What should I tell my  care team before I take this medication? They need to know if you have any of these conditions: Heart disease History of low blood cell levels caused by a medication Liver disease Recent or ongoing radiation An unusual or allergic reaction to doxorubicin, other medications, foods, dyes, or  preservatives If you or your partner are pregnant or trying to get pregnant Breast-feeding How should I use this medication? This medication is injected into a vein. It is given by your care team in a hospital or clinic setting. Talk to your care team about the use of this medication in children. Special care may be needed. Overdosage: If you think you have taken too much of this medicine contact a poison control center or emergency room at once. NOTE: This medicine is only for you. Do not share this medicine with others. What if I miss a dose? Keep appointments for follow-up doses. It is important not to miss your dose. Call your care team if you are unable to keep an appointment. What may interact with this medication? 6-mercaptopurine Paclitaxel Phenytoin St. John's wort Trastuzumab Verapamil This list may not describe all possible interactions. Give your health care provider a list of all the medicines, herbs, non-prescription drugs, or dietary supplements you use. Also tell them if you smoke, drink alcohol, or use illegal drugs. Some items may interact with your medicine. What should I watch for while using this medication? Your condition will be monitored carefully while you are receiving this medication. You may need blood work while taking this medication. This medication may make you feel generally unwell. This is not uncommon as chemotherapy can affect healthy cells as well as cancer cells. Report any side effects. Continue your course of treatment even though you feel ill unless your care team tells you to stop. There is a maximum amount of this medication you should receive throughout your life. The amount depends on the medical condition being treated and your overall health. Your care team will watch how much of this medication you receive. Tell your care team if you have taken this medication before. Your urine may turn red for a few days after your dose. This is not blood. If  your urine is dark or brown, call your care team. In some cases, you may be given additional medications to help with side effects. Follow all directions for their use. This medication may increase your risk of getting an infection. Call your care team for advice if you get a fever, chills, sore throat, or other symptoms of a cold or flu. Do not treat yourself. Try to avoid being around people who are sick. This medication may increase your risk to bruise or bleed. Call your care team if you notice any unusual bleeding. Talk to your care team about your risk of cancer. You may be more at risk for certain types of cancers if you take this medication. You should make sure that you get enough Coenzyme Q10 while you are taking this medication. Discuss the foods you eat and the vitamins you take with your care team. Talk to your care team if you or your partner may be pregnant. Serious birth defects can occur if you take this medication during pregnancy and for 6 months after the last dose. Contraception is recommended while taking this medication and for 6 months after the last dose. Your care team can help you find the option that works for you. If your partner can get pregnant, use a condom while taking  this medication and for 6 months after the last dose. Do not breastfeed while taking this medication. This medication may cause infertility. Talk to your care team if you are concerned about your fertility. What side effects may I notice from receiving this medication? Side effects that you should report to your care team as soon as possible: Allergic reactions--skin rash, itching, hives, swelling of the face, lips, tongue, or throat Heart failure--shortness of breath, swelling of the ankles, feet, or hands, sudden weight gain, unusual weakness or fatigue Heart rhythm changes--fast or irregular heartbeat, dizziness, feeling faint or lightheaded, chest pain, trouble breathing Infection--fever, chills,  cough, sore throat, wounds that don't heal, pain or trouble when passing urine, general feeling of discomfort or being unwell Low red blood cell level--unusual weakness or fatigue, dizziness, headache, trouble breathing Painful swelling, warmth, or redness of the skin, blisters or sores at the infusion site Unusual bruising or bleeding Side effects that usually do not require medical attention (report to your care team if they continue or are bothersome): Diarrhea Hair loss Nausea Pain, redness, or swelling with sores inside the mouth or throat Red urine This list may not describe all possible side effects. Call your doctor for medical advice about side effects. You may report side effects to FDA at 1-800-FDA-1088. Where should I keep my medication? This medication is given in a hospital or clinic. It will not be stored at home. NOTE: This sheet is a summary. It may not cover all possible information. If you have questions about this medicine, talk to your doctor, pharmacist, or health care provider.  2023 Elsevier/Gold Standard (2021-12-31 00:00:00) Vinblastine Injection What is this medication? VINBLASTINE (vin BLAS teen) treats some types of cancer. It works by slowing down the growth of cancer cells. This medicine may be used for other purposes; ask your health care provider or pharmacist if you have questions. COMMON BRAND NAME(S): Velban What should I tell my care team before I take this medication? They need to know if you have any of these conditions: Heart disease Infection Liver disease Low white blood cell levels Lung disease An unusual or allergic reaction to vinblastine, other chemotherapy agents, other medications, foods, dyes, or preservatives Pregnant or trying to get pregnant Breast-feeding How should I use this medication? This medication is injected into a vein. It is given by your care team in a hospital or clinic setting. Talk to your care team about the use of  this medication in children. While it may be given to children for selected conditions, precautions do apply. Overdosage: If you think you have taken too much of this medicine contact a poison control center or emergency room at once. NOTE: This medicine is only for you. Do not share this medicine with others. What if I miss a dose? Keep appointments for follow-up doses. It is important not to miss your dose. Call your care team if you are unable to keep an appointment. What may interact with this medication? Erythromycin Phenytoin This medication may affect how other medications work, and other medications may affect the way this medication works. Talk with your care team about all of the medications you take. They may suggest changes to your treatment plan to lower the risk of side effects and to make sure your medications work as intended. This list may not describe all possible interactions. Give your health care provider a list of all the medicines, herbs, non-prescription drugs, or dietary supplements you use. Also tell them if you smoke,  drink alcohol, or use illegal drugs. Some items may interact with your medicine. What should I watch for while using this medication? Your condition will be monitored carefully while you are receiving this medication. This medication may make you feel generally unwell. This is not uncommon as chemotherapy can affect healthy cells as well as cancer cells. Report any side effects. Continue your course of treatment even though you feel ill unless your care team tells you to stop. You may need blood work while taking this medication. This medication will cause constipation. If you do not have a bowel movement for 3 days, call your care team. This medication may increase your risk to bruise or bleed. Call your care team if you notice any unusual bleeding. This medication may increase your risk of getting an infection. Call your care team for advice if you get a  fever, chills, sore throat, or other symptoms of a cold or flu. Do not treat yourself. Try to avoid being around people who are sick. Be careful brushing or flossing your teeth or using a toothpick because you may get an infection or bleed more easily. If you have any dental work done, tell your dentist you are receiving this medication. Talk to your care team if you or your partner wish to become pregnant or think either of you might be pregnant. This medication can cause serious birth defects. This medication may cause infertility. Talk to your care team if you are concerned about your fertility. Talk to your care team before breastfeeding. Changes to your treatment plan may be needed. What side effects may I notice from receiving this medication? Side effects that you should report to your care team as soon as possible: Allergic reactions--skin rash, itching, hives, swelling of the face, lips, tongue, or throat Infection--fever, chills, cough, sore throat, wounds that don't heal, pain or trouble when passing urine, general feeling of discomfort or being unwell Painful swelling, warmth, or redness of the skin, blisters or sores at the infusion site Shortness of breath or trouble breathing Unusual bruising or bleeding Side effects that usually do not require medical attention (report to your care team if they continue or are bothersome): Bone pain Constipation Hair loss Nausea Stomach pain Vomiting This list may not describe all possible side effects. Call your doctor for medical advice about side effects. You may report side effects to FDA at 1-800-FDA-1088. Where should I keep my medication? This medication is given in a hospital or clinic. It will not be stored at home. NOTE: This sheet is a summary. It may not cover all possible information. If you have questions about this medicine, talk to your doctor, pharmacist, or health care provider.  2023 Elsevier/Gold Standard (2021-11-18  00:00:00) Dacarbazine Injection What is this medication? DACARBAZINE (da KAR ba zeen) treats skin cancer and lymphoma. It works by slowing down the growth of cancer cells. This medicine may be used for other purposes; ask your health care provider or pharmacist if you have questions. COMMON BRAND NAME(S): DTIC-Dome What should I tell my care team before I take this medication? They need to know if you have any of these conditions: Infection, such as chickenpox, cold sores, herpes Kidney disease Liver disease Low blood cell levels, such as low white cells, platelets, or red blood cells Recent radiation therapy An unusual or allergic reaction to dacarbazine, other medications, foods, dyes, or preservatives Pregnant or trying to get pregnant Breast-feeding How should I use this medication? This medication is given as  an injected into a vein. It is given by your care team in a hospital or clinic setting. Talk to your care team about the use of this medication in children. While it may be prescribed for selected conditions, precautions do apply. Overdosage: If you think you have taken too much of this medicine contact a poison control center or emergency room at once. NOTE: This medicine is only for you. Do not share this medicine with others. What if I miss a dose? Keep appointments for follow-up doses. It is important not to miss your dose. Call your care team if you are unable to keep an appointment. What may interact with this medication? Do not take this medication with any of the following: Live virus vaccines This medication may also interact with the following: Medications to increase blood counts, such as filgrastim, pegfilgrastim, sargramostim This list may not describe all possible interactions. Give your health care provider a list of all the medicines, herbs, non-prescription drugs, or dietary supplements you use. Also tell them if you smoke, drink alcohol, or use illegal drugs.  Some items may interact with your medicine. What should I watch for while using this medication? Your condition will be monitored carefully while you are receiving this medication. You may need blood work done while taking this medication. This medication may make you feel generally unwell. This is not uncommon as chemotherapy can affect healthy cells as well as cancer cells. Report any side effects. Continue your course of treatment even though you feel ill unless your care team tells you to stop. This medication may increase your risk of getting an infection. Call your care team for advice if you get a fever, chills, sore throat, or other symptoms of a cold or flu. Do not treat yourself. Try to avoid being around people who are sick. This medication may increase your risk to bruise or bleed. Call your care team if you notice any unusual bleeding. Talk to your care team about your risk of cancer. You may be more at risk for certain types of cancers if you take this medication. Talk to your care team if you wish to become pregnant or think you might be pregnant. This medication can cause serious birth defects if taken during pregnancy. A reliable form of contraception is recommended while taking this medication. Talk to your care team about effective forms of contraception. Do not breastfeed while taking this medication. What side effects may I notice from receiving this medication? Side effects that you should report to your care team as soon as possible: Allergic reactions--skin rash, itching, hives, swelling of the face, lips, tongue, or throat Infection--fever, chills, cough, sore throat, wounds that don't heal, pain or trouble when passing urine, general feeling of discomfort or being unwell Liver injury--right upper belly pain, loss of appetite, nausea, light-colored stool, dark yellow or brown urine, yellowing skin or eyes, unusual weakness or fatigue Low red blood cell level--unusual weakness  or fatigue, dizziness, headache, trouble breathing Painful swelling, warmth, or redness of the skin, blisters or sores at the infusion site Unusual bruising or bleeding Side effects that usually do not require medical attention (report to your care team if they continue or are bothersome): Hair loss Loss of appetite Nausea Vomiting This list may not describe all possible side effects. Call your doctor for medical advice about side effects. You may report side effects to FDA at 1-800-FDA-1088. Where should I keep my medication? This medication is given in a hospital or clinic.  It will not be stored at home. NOTE: This sheet is a summary. It may not cover all possible information. If you have questions about this medicine, talk to your doctor, pharmacist, or health care provider.  2023 Elsevier/Gold Standard (2021-12-19 00:00:00)

## 2022-08-13 ENCOUNTER — Telehealth: Payer: Self-pay

## 2022-08-13 NOTE — Telephone Encounter (Signed)
-----   Message from Alvera Singh, RN sent at 08/12/2022  5:09 PM EST ----- Regarding: First time First time Adriamycin, vinblastine, dacarbazine- Dr. Irene Limbo. Father legal guardian. Tolerated well.

## 2022-08-13 NOTE — Telephone Encounter (Signed)
Paul [name]'s father states that his son is doing finel.  He is eating, drinking, and urinating well. They know to call the office at [phone] if they have any questions or concerns.

## 2022-08-14 ENCOUNTER — Other Ambulatory Visit: Payer: Self-pay

## 2022-08-16 ENCOUNTER — Other Ambulatory Visit: Payer: Self-pay

## 2022-08-18 ENCOUNTER — Encounter: Payer: Self-pay | Admitting: Hematology

## 2022-08-19 ENCOUNTER — Inpatient Hospital Stay (HOSPITAL_BASED_OUTPATIENT_CLINIC_OR_DEPARTMENT_OTHER): Payer: Medicare Other | Admitting: Hematology

## 2022-08-19 ENCOUNTER — Other Ambulatory Visit: Payer: Self-pay

## 2022-08-19 VITALS — BP 101/73 | HR 92 | Temp 97.9°F | Resp 18 | Ht 68.0 in | Wt 237.9 lb

## 2022-08-19 DIAGNOSIS — C8192 Hodgkin lymphoma, unspecified, intrathoracic lymph nodes: Secondary | ICD-10-CM

## 2022-08-19 DIAGNOSIS — Z5111 Encounter for antineoplastic chemotherapy: Secondary | ICD-10-CM | POA: Diagnosis not present

## 2022-08-19 NOTE — Progress Notes (Signed)
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 male who has been referred to [place] by .Lazoff, Shawn P, DO and Dr. Wonda Amis MD for evaluation and management of newly diagnosed mediastinal mass concerning for likely lymphoma.  Patient has a history of fetal alcohol syndrome with developmental delay, narcolepsy, ADD, exercise-induced asthma and lives with his adoptive parents. He recently presented to the hospital on 07/01/2022 with 2-week history of worsening shortness of breath cough and wheezing.  He received his asthma treatment but was still noted to have significant cough.  In the emergency room he had a chest x-ray which showed an mediastinal mass. Subsequent CT chest with contrast on 07/01/2022 showed extensive mediastinal and hilar lymphadenopathy with conglomerate soft tissue mass/adenopathy within the anterior mediastinum measuring 11.2 x 7.9 x 13 cm. Compression of the left brachiocephalic and SVC within the mid though these vascular structures remain patent.  Patient did not have any clinical signs or symptoms of SVC compression syndrome or left upper extremity swelling.  Patient subsequently had a CT of the abdomen and pelvis which showed no acute intra-abdominal or intrapelvic abnormalities. He underwent a CT-guided core needle biopsy of the mediastinal mass by interventional radiology.  The official pathology results from his biopsy are not currently available at the time of this clinic visit. I did call and talk to the pathologist Dr. Donneta Romberg and she notes that this either looks like a lymphoma or thymoma and she is running additional tests to make a final  diagnosis.  Patient's father accompanied him for this visit and his mother who helps make most of the decisions in tandem with his father was present on the phone.  They do not note any unexplained fevers chills night sweats or significant weight loss.  His breathing has been relatively stable since hospital discharge but he is still having some cough.  I confirmed adequacy of tissue sampling with the pathologist and the patient was then started on prednisone to try to help his cough by drinking his mediastinal tumor some and reducing bronchial inflammation.  Patient is unable to provide much information or review of systems on account of his developmental delay issues.  Interval History:   Paul [name] is a wonderful 31 y.o. male who is here with his mother for continued evaluation and management of newly diagnosed mediastinal mass concerning for likely lymphoma. He is here for toxicity check from cycle 1 of his treatment.   Patient was last seen by me on 08/12/2022 and was doing well overall. He started his treatment on 08/12/2022 without Bleomycin because he did not have PFTs.  Patient is here with his father during this visit. He is complaining of dental pain, dizziness, and headache due to his treatment. He notes that he feels dizzy when he stands up, which started yesterday. He notes the dizziness as light headed. Patient notes he has not been drinking water, only 3 glasses of kool aid. He describes his headaches near his forehead and lasts him the whole day.   Patient denies fever, chills, cough, congestion, chest pain, abdominal pain, back pain, shortness of breath, abnormal bowl moment, or leg swelling.   His father notes that he does not see a  dentist. His father also notes that he has not yet been scheduled for lung functioning test.   MEDICAL HISTORY:  Past Medical History:  Diagnosis Date   ADD (attention deficit disorder)    ADHD (attention deficit hyperactivity disorder)     Asthma    excercise induced and pollen   Auditory processing disorder    Complication of anesthesia    mother states pt. is harder to put under anes. and hard to wake up due to fetal alcohol syndrome; can be combative, per mother; states he will do better if mother is in PACU when he is coming out of anes.   Development delay    states mental status of a 31 year old   Exercise-induced asthma    prn inhaler/neb.   Fetal alcohol syndrome    Narcolepsy    Non-restorable tooth 09/2015   teeth   Separation anxiety    Twin birth     SURGICAL HISTORY: Past Surgical History:  Procedure Laterality Date   IR IMAGING GUIDED PORT INSERTION  08/11/2022   MULTIPLE EXTRACTIONS WITH ALVEOLOPLASTY N/A 09/23/2015   Procedure: MULTIPLE EXTRACTION WITH ALVEOLOPLASTY;  Surgeon: Diona Browner, DDS;  Location: [facility];  Service: Oral Surgery;  Laterality: N/A;   PYLOROMYOTOMY     age [age], performed found to not have stenosis on surgical exam   PYLOROMYOTOMY     did not have pyloric stenosis; did surgery on the wrong twin   RADIOLOGY WITH ANESTHESIA N/A 01/16/2020   Procedure: MRI WITH ANESTHESIA  BRAIN WITH AND WITHOUT CONTRAST;  Surgeon: Radiologist, Medication, MD;  Location: [facility];  Service: Radiology;  Laterality: N/A;    SOCIAL HISTORY: Social History   Socioeconomic History   Marital status: Single    Spouse name: Not on file   Number of children: Not on file   Years of education: Not on file   Highest education level: Not on file  Occupational History   Not on file  Tobacco Use   Smoking status: Never   Smokeless tobacco: Never  Vaping Use   Vaping Use: Never used  Substance and Sexual Activity   Alcohol use: No   Drug use: No   Sexual activity: Not on file  Other Topics Concern   Not on file  Social History Narrative   ** Merged History Encounter **       Social Determinants of Health   Financial Resource Strain: Not on file  Food Insecurity: Not on file   Transportation Needs: Not on file  Physical Activity: Not on file  Stress: Not on file  Social Connections: Not on file  Intimate Partner Violence: Not on file    FAMILY HISTORY: Family History  Adopted: Yes  Problem Relation Age of Onset   Mental illness Mother    Diabetes Mother    Hypertension Mother    Schizophrenia Mother    Bipolar disorder Mother    Mental retardation Father     ALLERGIES:  is allergic to other.  MEDICATIONS:  Current Outpatient Medications  Medication Sig Dispense Refill   albuterol (PROVENTIL HFA;VENTOLIN HFA) 108 (90 BASE) MCG/ACT inhaler Inhale into the lungs every 6 (six) hours as needed for wheezing or shortness of breath.     albuterol (PROVENTIL) (5 MG/ML) 0.5% nebulizer solution Take 2.5 mg by nebulization every 6 (six) hours as needed for wheezing or shortness of breath.     Cholecalciferol (VITAMIN D3) 250 MCG (10000 UT) capsule Take 10,000 Units by mouth daily.  dexamethasone (DECADRON) 4 MG tablet Take 2 tablets by mouth once a day for 3 days after chemotherapy. Take with food. 30 tablet 1   lidocaine-prilocaine (EMLA) cream Apply to affected area once 30 g 3   loratadine (CLARITIN) 10 MG tablet Take 10 mg by mouth daily.     mometasone-formoterol (DULERA) 200-5 MCG/ACT AERO Inhale 2 puffs into the lungs 2 (two) times daily. 8.8 g 0   Multiple Vitamin (MULTIVITAMIN WITH MINERALS) TABS tablet Take 1 tablet by mouth daily.     ondansetron (ZOFRAN) 4 MG tablet Take 1 tablet (4 mg total) by mouth every 6 (six) hours as needed for nausea. (Patient not taking: Reported on 07/08/2022) 20 tablet 0   prochlorperazine (COMPAZINE) 10 MG tablet Take 1 tablet (10 mg total) by mouth every 6 (six) hours as needed for nausea or vomiting. 30 tablet 1   No current facility-administered medications for this visit.    REVIEW OF SYSTEMS:    10 Point review of Systems was done is negative except as noted above.  PHYSICAL EXAMINATION  BP 101/73 (BP  Location: Left Arm, Patient Position: Sitting)   Pulse 92   Temp 97.9 F (36.6 C) (Tympanic)   Resp 18   Ht '5\' 8"'$  (1.727 m)   Wt 237 lb 14.4 oz (107.9 kg)   SpO2 100%   BMI 36.17 kg/m  . GENERAL:alert, in no acute distress and comfortable SKIN: no acute rashes, no significant lesions EYES: conjunctiva are pink and non-injected, sclera anicteric OROPHARYNX: MMM, no exudates, no oropharyngeal erythema or ulceration NECK: supple, no JVD LYMPH:  no palpable lymphadenopathy in the cervical, axillary or inguinal regions LUNGS: clear to auscultation b/l with normal respiratory effort HEART: regular rate & rhythm ABDOMEN:  normoactive bowel sounds , non tender, not distended. Extremity: no pedal edema PSYCH: alert & oriented x 3 with fluent speech NEURO: no focal motor/sensory deficits   LABORATORY DATA:  I have reviewed the data as listed  .    Latest Ref Rng & Units 08/12/2022   11:46 AM 07/01/2022    1:57 PM 09/10/2015    2:36 PM  CBC  WBC 4.0 - 10.5 K/uL 8.3  6.6  5.8   Hemoglobin 13.0 - 17.0 g/dL 14.5  14.4  14.9   Hematocrit 39.0 - 52.0 % 42.5  41.6  41.8   Platelets 150 - 400 K/uL 302  266  263     .    Latest Ref Rng & Units 08/12/2022   11:46 AM 07/01/2022    1:57 PM  CMP  Glucose 70 - 99 mg/dL 100  100   BUN 6 - 20 mg/dL 10  11   Creatinine 0.61 - 1.24 mg/dL 0.80  0.94   Sodium 135 - 145 mmol/L 136  138   Potassium 3.5 - 5.1 mmol/L 3.9  3.9   Chloride 98 - 111 mmol/L 103  102   CO2 22 - 32 mmol/L 29  28   Calcium 8.9 - 10.3 mg/dL 9.7  9.6   Total Protein 6.5 - 8.1 g/dL 7.4  7.5   Total Bilirubin 0.3 - 1.2 mg/dL 0.3  0.5   Alkaline Phos 38 - 126 U/L 124  113   AST 15 - 41 U/L 19  22   ALT 0 - 44 U/L 22  31    . Lab Results  Component Value Date   LDH 171 07/03/2022   RADIOGRAPHIC STUDIES: I have personally reviewed the radiological images as listed  and agreed with the findings in the report. IR IMAGING GUIDED PORT INSERTION  Result Date:  08/11/2022 CLINICAL DATA:  Hodgkin's lymphoma and need for porta cath for chemotherapy. EXAM: IMPLANTED PORT A CATH PLACEMENT WITH ULTRASOUND AND FLUOROSCOPIC GUIDANCE ANESTHESIA/SEDATION: Moderate (conscious) sedation was employed during this procedure. A total of Versed 3.0 mg and Fentanyl 200 mcg was administered intravenously by radiology nursing. Moderate Sedation Time: 32 minutes. The patient's level of consciousness and vital signs were monitored continuously by radiology nursing throughout the procedure under my direct supervision. FLUOROSCOPY: 4.0 mGy PROCEDURE: The procedure, risks, benefits, and alternatives were explained to the patient. Questions regarding the procedure were encouraged and answered. The patient understands and consents to the procedure. A time-out was performed prior to initiating the procedure. Ultrasound was utilized to confirm patency of the right internal jugular vein. A permanent ultrasound image was recorded and saved. The right neck and chest were prepped with chlorhexidine in a sterile fashion, and a sterile drape was applied covering the operative field. Maximum barrier sterile technique with sterile gowns and gloves were used for the procedure. Local anesthesia was provided with 1% lidocaine. After creating a small venotomy incision, a 21 gauge needle was advanced into the right internal jugular vein under direct, real-time ultrasound guidance. Ultrasound image documentation was performed. After securing guidewire access, an 8 Fr dilator was placed. A J-wire was kinked to measure appropriate catheter length. A subcutaneous port pocket was then created along the upper chest wall utilizing sharp and blunt dissection. Portable cautery was utilized. The pocket was irrigated with sterile saline. A single lumen power injectable port was chosen for placement. The 8 Fr catheter was tunneled from the port pocket site to the venotomy incision. The port was placed in the pocket. External  catheter was trimmed to appropriate length based on guidewire measurement. At the venotomy, an 8 Fr peel-away sheath was placed over a guidewire. The catheter was then placed through the sheath and the sheath removed. Final catheter positioning was confirmed and documented with a fluoroscopic spot image. The port was accessed with a needle and aspirated and flushed with heparinized saline. The access needle was removed. The venotomy and port pocket incisions were closed with subcutaneous 3-0 Monocryl and subcuticular 4-0 Vicryl. Dermabond was applied to [name] incisions. COMPLICATIONS: COMPLICATIONS None FINDINGS: After catheter placement, the tip lies at the cavo-atrial junction. The catheter aspirates normally and is ready for immediate use. IMPRESSION: Placement of single lumen port a cath via right internal jugular vein. The catheter tip lies at the cavo-atrial junction. A power injectable port a cath was placed and is ready for immediate use. Electronically Signed   By: Aletta Edouard M.D.   On: 08/11/2022 15:41   NM PET Image Initial (PI) Skull Base To Thigh  Result Date: 07/23/2022 CLINICAL DATA:  Initial treatment strategy for anterior mediastinal mass on CT. EXAM: NUCLEAR MEDICINE PET SKULL BASE TO THIGH TECHNIQUE: 11.7 mCi F-18 FDG was injected intravenously. Full-ring PET imaging was performed from the skull base to thigh after the radiotracer. CT data was obtained and used for attenuation correction and anatomic localization. Fasting blood glucose: 106 mg/dl COMPARISON:  Abdominopelvic CT 07/03/2022. Chest CT 07/01/2022. biopsy results 07/03/2022 demonstrating classical Hodgkin's lymphoma. FINDINGS: Mediastinal blood pool activity: SUV max 2.5 Liver activity: SUV max 3.8 NECK: No areas of abnormal hypermetabolism. Incidental CT findings: No cervical adenopathy. CHEST: High left paratracheal node measures 1.7 cm and a S.U.V. max of 7.8 on 51/4. The dominant anterior mediastinal  mass demonstrates  heterogeneous hypermetabolism with some areas of presumed necrosis. On the order of 12.8 by 7.4 cm and a S.U.V. max of 7.7 on 65/4. Right hilar hypermetabolic nodal mass measures 2.4 cm on 65/2 of the 07/01/2022 CT and a S.U.V. max of 17.4 today. No pulmonary parenchymal hypermetabolism. Incidental CT findings: Deferred to recent diagnostic CT. ABDOMEN/PELVIS: No abdominopelvic parenchymal or nodal hypermetabolism. Incidental CT findings: Deferred to recent diagnostic CT. No abdominopelvic adenopathy. No splenomegaly. SKELETON: No abnormal marrow activity. Incidental CT findings: None. IMPRESSION: Hypermetabolic lymphoma within the mediastinum and right hilum, as detailed above. ([place]) 5 Electronically Signed   By: Abigail Miyamoto M.D.   On: 07/23/2022 11:53    Surgical Pathology result from 07/03/2022: FINAL MICROSCOPIC DIAGNOSIS:  A. MEDIASTINAL MASS, ANTERIOR, NEEDLE CORE BIOPSY: -Atypical lymphoid proliferation consistent with classical Hodgkin lymphoma -See comment  COMMENT:  The sections show needle core biopsy fragments displaying a nodular and diffuse lymphoid proliferation associated with dense sclerosis.  The lymphoid process shows predominance of small lymphoid cells admixed to a lesser extent with histiocytes, eosinophils in addition to the large atypical mononuclear and occasionally lobated lymphoid appearing cells with variably prominent nucleoli.  Flow cytometric analysis was performed [facility] [phone]) and shows predominance of CD4-positive T cells. No monoclonal B-cell population identified.  In addition, a battery of immunohistochemical stains was performed including CD30, CD15, mum 1, LCA, CD20, PAX5, CD3, CD5, CD4, CD8, TdT, cytokeratin AE1/AE3, cytokeratin 8/18 and EBV in situ hybridization with appropriate controls.  The large atypical lymphoid appearing cells are positive for CD15, CD30, PAX5, mum 1 and rare cells for CD20.  No significant staining is seen with LCA, EBV,  CD3, CD5, CD4, CD8.  Cytokeratin stains highlight a small focus of positivity likely representing native epithelial elements.  No significant TdT positivity is identified.  The small lymphocytes in the background show a mixture of T and B cells with predominance of T cells.  The latter show predominance of CD4 positive cells.  Overall, the features are atypical and most consistent with classical Hodgkin lymphoma which is best subclassified as nodular sclerosis type.   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  Less likely thymoma. Given the patient's age most likely differential would include Hodgkin's lymphoma and primary mediastinal large B-cell lymphoma. The mass is causing some compression of his SVC and left brachiocephalic vein but no complete obstruction or symptoms related to this. He does not note normal his parents any obvious constitutional symptoms.  Plan: -Discussed lab results from today with the patient and his father. CBC and CMP is stable.  -No severe toxicity with his first treatment  -Schedule lung function testing again because pt's father did not get call for lung function.  -Continue to hold bleomycin until lung function testing. Only AVD. -Answered patient's questions and his father's questions regarding his treatment.   Follow-up: PFTs ASAP Other treatment scheduling per integrated scheduling  The total time spent in the appointment was 20 minutes* .  All of the patient's questions were answered with apparent satisfaction. The patient knows to call the clinic with any problems, questions or concerns.   Sullivan Lone MD MS AAHIVMS [facility] Hematology/Oncology Physician [facility]  .*Total Encounter Time as defined by  the Centers for Medicare and Medicaid Services includes, in  addition to the face-to-face time of a patient visit (documented in the note above) non-face-to-face time: obtaining and reviewing outside history, ordering and reviewing medications, tests or procedures, care coordination (communications with other health care professionals or caregivers) and documentation in the medical record.   I, Cleda Mccreedy, am acting as a Education administrator for Sullivan Lone, MD.  .I have reviewed the above documentation for accuracy and completeness, and I agree with the above. Brunetta Genera MD

## 2022-08-20 ENCOUNTER — Other Ambulatory Visit: Payer: Self-pay

## 2022-08-23 ENCOUNTER — Other Ambulatory Visit: Payer: Self-pay

## 2022-08-23 NOTE — Progress Notes (Unsigned)
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 male who has been referred to [place] by .Lazoff, Shawn P, DO and Dr. Wonda Amis MD for evaluation and management of newly diagnosed mediastinal mass concerning for likely lymphoma.   Patient has a history of fetal alcohol syndrome with developmental delay, narcolepsy, ADD, exercise-induced asthma and lives with his adoptive parents. He recently presented to the hospital on 07/01/2022 with 2-week history of worsening shortness of breath cough and wheezing.  He received his asthma treatment but was still noted to have significant cough.  In the emergency room he had a chest x-ray which showed an mediastinal mass. Subsequent CT chest with contrast on 07/01/2022 showed extensive mediastinal and hilar lymphadenopathy with conglomerate soft tissue mass/adenopathy within the anterior mediastinum measuring 11.2 x 7.9 x 13 cm. Compression of the left brachiocephalic and SVC within the mid though these vascular structures remain patent.   Patient did not have any clinical signs or symptoms of SVC compression syndrome or left upper extremity swelling.   Patient subsequently had a CT of the abdomen and pelvis which showed no acute intra-abdominal or intrapelvic abnormalities. He underwent a CT-guided core needle biopsy of the mediastinal mass by interventional radiology.   The official pathology results from his biopsy are not currently available at the time of this clinic visit. I did call and talk to the pathologist Dr. Donneta Romberg and she notes that this either looks like a lymphoma or thymoma and she is running additional tests to make a final diagnosis.   Patient's father accompanied him for  this visit and his mother who helps make most of the decisions in tandem with his father was present on the phone.  They do not note any unexplained fevers chills night sweats or significant weight loss.  His breathing has been relatively stable since hospital discharge but he is still having some cough.   I confirmed adequacy of tissue sampling with the pathologist and the patient was then started on prednisone to try to help his cough by drinking his mediastinal tumor some and reducing bronchial inflammation.   Patient is unable to provide much information or review of systems on account of his developmental delay issues.    INTERVAL HISTORY: Paul [name] 31 y.o. male returns to the clinic today for follow-up visit accompanied by ***for follow-up visit for his recently diagnosed Hodgkin's lymphoma.  He is here today for evaluation and repeat blood work before undergoing day 15 cycle #1.  The patient was last seen by Dr. Freada Bergeron on 12/13/thousand 23.  The patient overall tolerated day 1 cycle 1 well without any concerning adverse side effects.  Bleomycin has been on hold since the patient has not obtained his baseline PFTs.  The patient denies any fever, chills, night sweats, or unexplained weight loss since last being seen.  At his last appointment with Dr. Rod Can, the patient was endorsing dizziness and reported that he does not drink a lot of water.  He also been endorsing headache for which she does or does not?  Take Tylenol.  He denies any signs and symptoms of infection such as sore throat, cough, shortness of breath, chest congestion, abdominal pain, nausea, vomiting, diarrhea, constipation, leg  swelling, dysuria, or skin infections.  Extremity swelling?  He is here today for evaluation repeat blood work before undergoing the day 15 cycle #2.      MEDICAL HISTORY: Past Medical History:  Diagnosis Date   ADD (attention deficit disorder)    ADHD (attention deficit hyperactivity  disorder)    Asthma    excercise induced and pollen   Auditory processing disorder    Complication of anesthesia    mother states pt. is harder to put under anes. and hard to wake up due to fetal alcohol syndrome; can be combative, per mother; states he will do better if mother is in PACU when he is coming out of anes.   Development delay    states mental status of a 31 year old   Exercise-induced asthma    prn inhaler/neb.   Fetal alcohol syndrome    Narcolepsy    Non-restorable tooth 09/2015   teeth   Separation anxiety    Twin birth     ALLERGIES:  is allergic to other.  MEDICATIONS:  Current Outpatient Medications  Medication Sig Dispense Refill   albuterol (PROVENTIL HFA;VENTOLIN HFA) 108 (90 BASE) MCG/ACT inhaler Inhale into the lungs every 6 (six) hours as needed for wheezing or shortness of breath.     albuterol (PROVENTIL) (5 MG/ML) 0.5% nebulizer solution Take 2.5 mg by nebulization every 6 (six) hours as needed for wheezing or shortness of breath.     Cholecalciferol (VITAMIN D3) 250 MCG (10000 UT) capsule Take 10,000 Units by mouth daily.     dexamethasone (DECADRON) 4 MG tablet Take 2 tablets by mouth once a day for 3 days after chemotherapy. Take with food. 30 tablet 1   lidocaine-prilocaine (EMLA) cream Apply to affected area once 30 g 3   loratadine (CLARITIN) 10 MG tablet Take 10 mg by mouth daily.     mometasone-formoterol (DULERA) 200-5 MCG/ACT AERO Inhale 2 puffs into the lungs 2 (two) times daily. 8.8 g 0   Multiple Vitamin (MULTIVITAMIN WITH MINERALS) TABS tablet Take 1 tablet by mouth daily.     ondansetron (ZOFRAN) 4 MG tablet Take 1 tablet (4 mg total) by mouth every 6 (six) hours as needed for nausea. (Patient not taking: Reported on 07/08/2022) 20 tablet 0   prochlorperazine (COMPAZINE) 10 MG tablet Take 1 tablet (10 mg total) by mouth every 6 (six) hours as needed for nausea or vomiting. 30 tablet 1   No current facility-administered medications for this  visit.    SURGICAL HISTORY:  Past Surgical History:  Procedure Laterality Date   IR IMAGING GUIDED PORT INSERTION  08/11/2022   MULTIPLE EXTRACTIONS WITH ALVEOLOPLASTY N/A 09/23/2015   Procedure: MULTIPLE EXTRACTION WITH ALVEOLOPLASTY;  Surgeon: Diona Browner, DDS;  Location: [facility];  Service: Oral Surgery;  Laterality: N/A;   PYLOROMYOTOMY     age [age], performed found to not have stenosis on surgical exam   PYLOROMYOTOMY     did not have pyloric stenosis; did surgery on the wrong twin   RADIOLOGY WITH ANESTHESIA N/A 01/16/2020   Procedure: MRI WITH ANESTHESIA  BRAIN WITH AND WITHOUT CONTRAST;  Surgeon: Radiologist, Medication, MD;  Location: [facility];  Service: Radiology;  Laterality: N/A;    REVIEW OF SYSTEMS:   Review of Systems  Constitutional: Negative for appetite change, chills, fatigue, fever and unexpected weight change.  HENT:   Negative for mouth sores, nosebleeds, sore throat and trouble swallowing.   Eyes: Negative for eye problems and icterus.  Respiratory: Negative  for cough, hemoptysis, shortness of breath and wheezing.   Cardiovascular: Negative for chest pain and leg swelling.  Gastrointestinal: Negative for abdominal pain, constipation, diarrhea, nausea and vomiting.  Genitourinary: Negative for bladder incontinence, difficulty urinating, dysuria, frequency and hematuria.   Musculoskeletal: Negative for back pain, gait problem, neck pain and neck stiffness.  Skin: Negative for itching and rash.  Neurological: Negative for dizziness, extremity weakness, gait problem, headaches, light-headedness and seizures.  Hematological: Negative for adenopathy. Does not bruise/bleed easily.  Psychiatric/Behavioral: Negative for confusion, depression and sleep disturbance. The patient is not nervous/anxious.     PHYSICAL EXAMINATION:  There were no vitals taken for this visit.  ECOG PERFORMANCE STATUS: {CHL ONC ECOG [id number]  Physical Exam   Constitutional: Oriented to person, place, and time and well-developed, well-nourished, and in no distress. No distress.  HENT:  Head: Normocephalic and atraumatic.  Mouth/Throat: Oropharynx is clear and moist. No oropharyngeal exudate.  Eyes: Conjunctivae are normal. Right eye exhibits no discharge. Left eye exhibits no discharge. No scleral icterus.  Neck: Normal range of motion. Neck supple.  Cardiovascular: Normal rate, regular rhythm, normal heart sounds and intact distal pulses.   Pulmonary/Chest: Effort normal and breath sounds normal. No respiratory distress. No wheezes. No rales.  Abdominal: Soft. Bowel sounds are normal. Exhibits no distension and no mass. There is no tenderness.  Musculoskeletal: Normal range of motion. Exhibits no edema.  Lymphadenopathy:    No cervical adenopathy.  Neurological: Alert and oriented to person, place, and time. Exhibits normal muscle tone. Gait normal. Coordination normal.  Skin: Skin is warm and dry. No rash noted. Not diaphoretic. No erythema. No pallor.  Psychiatric: Mood, memory and judgment normal.  Vitals reviewed.  LABORATORY DATA: Lab Results  Component Value Date   WBC 8.3 08/12/2022   HGB 14.5 08/12/2022   HCT 42.5 08/12/2022   MCV 89.7 08/12/2022   PLT 302 08/12/2022      Chemistry      Component Value Date/Time   NA 136 08/12/2022 1146   K 3.9 08/12/2022 1146   CL 103 08/12/2022 1146   CO2 29 08/12/2022 1146   BUN 10 08/12/2022 1146   CREATININE 0.80 08/12/2022 1146      Component Value Date/Time   CALCIUM 9.7 08/12/2022 1146   ALKPHOS 124 08/12/2022 1146   AST 19 08/12/2022 1146   ALT 22 08/12/2022 1146   BILITOT 0.3 08/12/2022 1146       RADIOGRAPHIC STUDIES:  IR IMAGING GUIDED PORT INSERTION  Result Date: 08/11/2022 CLINICAL DATA:  Hodgkin's lymphoma and need for porta cath for chemotherapy. EXAM: IMPLANTED PORT A CATH PLACEMENT WITH ULTRASOUND AND FLUOROSCOPIC GUIDANCE ANESTHESIA/SEDATION: Moderate  (conscious) sedation was employed during this procedure. A total of Versed 3.0 mg and Fentanyl 200 mcg was administered intravenously by radiology nursing. Moderate Sedation Time: 32 minutes. The patient's level of consciousness and vital signs were monitored continuously by radiology nursing throughout the procedure under my direct supervision. FLUOROSCOPY: 4.0 mGy PROCEDURE: The procedure, risks, benefits, and alternatives were explained to the patient. Questions regarding the procedure were encouraged and answered. The patient understands and consents to the procedure. A time-out was performed prior to initiating the procedure. Ultrasound was utilized to confirm patency of the right internal jugular vein. A permanent ultrasound image was recorded and saved. The right neck and chest were prepped with chlorhexidine in a sterile fashion, and a sterile drape was applied covering the operative field. Maximum barrier sterile technique with sterile gowns and gloves were  used for the procedure. Local anesthesia was provided with 1% lidocaine. After creating a small venotomy incision, a 21 gauge needle was advanced into the right internal jugular vein under direct, real-time ultrasound guidance. Ultrasound image documentation was performed. After securing guidewire access, an 8 Fr dilator was placed. A J-wire was kinked to measure appropriate catheter length. A subcutaneous port pocket was then created along the upper chest wall utilizing sharp and blunt dissection. Portable cautery was utilized. The pocket was irrigated with sterile saline. A single lumen power injectable port was chosen for placement. The 8 Fr catheter was tunneled from the port pocket site to the venotomy incision. The port was placed in the pocket. External catheter was trimmed to appropriate length based on guidewire measurement. At the venotomy, an 8 Fr peel-away sheath was placed over a guidewire. The catheter was then placed through the sheath and  the sheath removed. Final catheter positioning was confirmed and documented with a fluoroscopic spot image. The port was accessed with a needle and aspirated and flushed with heparinized saline. The access needle was removed. The venotomy and port pocket incisions were closed with subcutaneous 3-0 Monocryl and subcuticular 4-0 Vicryl. Dermabond was applied to both incisions. COMPLICATIONS: COMPLICATIONS None FINDINGS: After catheter placement, the tip lies at the cavo-atrial junction. The catheter aspirates normally and is ready for immediate use. IMPRESSION: Placement of single lumen port a cath via right internal jugular vein. The catheter tip lies at the cavo-atrial junction. A power injectable port a cath was placed and is ready for immediate use. Electronically Signed   By: Aletta Edouard M.D.   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  Less likely thymoma. Given the patient's age most likely differential would include Hodgkin's lymphoma and primary mediastinal large B-cell lymphoma. The mass is causing some compression of his SVC and left brachiocephalic vein but no complete obstruction or symptoms related to this. He does not note normal his parents any obvious constitutional symptoms.   Plan: -Discussed lab results from today with the patient and his father. CBC and CMP is stable.  -No severe toxicity with his first treatment  -Schedule lung function testing again because pt's father did not get call for lung function.  -Continue to hold bleomycin until lung function testing. Only AVD. -Answered patient's questions and his father's questions regarding his treatment.    Follow-up: PFTs ASAP -Proceed with day 15 AVD without bleomycin  today -F/u with Dr. Irene Limbo before day 1 cycle 2 on 09/08/22  ***REMOVE BLEO***   No orders of the defined types were placed in this encounter.    I spent {CHL ONC TIME VISIT - BMWUX:[id number]} counseling the patient face to face. The total time spent in the appointment was {CHL ONC TIME VISIT - ZDGUY:[id number]}.  Leeandra Ellerson L Ashton Sabine, PA-C 08/23/22

## 2022-08-24 ENCOUNTER — Encounter: Payer: Self-pay | Admitting: Hematology

## 2022-08-24 MED FILL — Fosaprepitant Dimeglumine For IV Infusion 150 MG (Base Eq): INTRAVENOUS | Qty: 5 | Status: AC

## 2022-08-24 MED FILL — Dexamethasone Sodium Phosphate Inj 100 MG/10ML: INTRAMUSCULAR | Qty: 1 | Status: AC

## 2022-08-25 ENCOUNTER — Inpatient Hospital Stay: Payer: Medicare Other

## 2022-08-25 ENCOUNTER — Inpatient Hospital Stay (HOSPITAL_BASED_OUTPATIENT_CLINIC_OR_DEPARTMENT_OTHER): Payer: Medicare Other | Admitting: Physician Assistant

## 2022-08-25 ENCOUNTER — Other Ambulatory Visit: Payer: Medicare Other

## 2022-08-25 ENCOUNTER — Telehealth: Payer: Self-pay

## 2022-08-25 ENCOUNTER — Other Ambulatory Visit: Payer: Self-pay

## 2022-08-25 ENCOUNTER — Telehealth: Payer: Self-pay | Admitting: *Deleted

## 2022-08-25 DIAGNOSIS — C8192 Hodgkin lymphoma, unspecified, intrathoracic lymph nodes: Secondary | ICD-10-CM

## 2022-08-25 DIAGNOSIS — Z5111 Encounter for antineoplastic chemotherapy: Secondary | ICD-10-CM | POA: Diagnosis not present

## 2022-08-25 DIAGNOSIS — Z95828 Presence of other vascular implants and grafts: Secondary | ICD-10-CM

## 2022-08-25 LAB — CBC WITH DIFFERENTIAL (CANCER CENTER ONLY)
Abs Immature Granulocytes: 0.02 10*3/uL (ref 0.00–0.07)
Basophils Absolute: 0 10*3/uL (ref 0.0–0.1)
Basophils Relative: 1 %
Eosinophils Absolute: 0.2 10*3/uL (ref 0.0–0.5)
Eosinophils Relative: 9 %
HCT: 38.3 % — ABNORMAL LOW (ref 39.0–52.0)
Hemoglobin: 13.5 g/dL (ref 13.0–17.0)
Immature Granulocytes: 1 %
Lymphocytes Relative: 52 %
Lymphs Abs: 1.3 10*3/uL (ref 0.7–4.0)
MCH: 30.8 pg (ref 26.0–34.0)
MCHC: 35.2 g/dL (ref 30.0–36.0)
MCV: 87.4 fL (ref 80.0–100.0)
Monocytes Absolute: 0.8 10*3/uL (ref 0.1–1.0)
Monocytes Relative: 32 %
Neutro Abs: 0.1 10*3/uL — CL (ref 1.7–7.7)
Neutrophils Relative %: 5 %
Platelet Count: 264 10*3/uL (ref 150–400)
RBC: 4.38 MIL/uL (ref 4.22–5.81)
RDW: 12 % (ref 11.5–15.5)
WBC Count: 2.4 10*3/uL — ABNORMAL LOW (ref 4.0–10.5)
nRBC: 0 % (ref 0.0–0.2)

## 2022-08-25 LAB — CMP (CANCER CENTER ONLY)
ALT: 35 U/L (ref 0–44)
AST: 26 U/L (ref 15–41)
Albumin: 4.1 g/dL (ref 3.5–5.0)
Alkaline Phosphatase: 104 U/L (ref 38–126)
Anion gap: 8 (ref 5–15)
BUN: 14 mg/dL (ref 6–20)
CO2: 27 mmol/L (ref 22–32)
Calcium: 9.1 mg/dL (ref 8.9–10.3)
Chloride: 102 mmol/L (ref 98–111)
Creatinine: 0.79 mg/dL (ref 0.61–1.24)
GFR, Estimated: >60 mL/min (ref >60)
Glucose, Bld: 109 mg/dL — ABNORMAL HIGH (ref 70–99)
Potassium: 3.7 mmol/L (ref 3.5–5.1)
Sodium: 137 mmol/L (ref 135–145)
Total Bilirubin: 0.5 mg/dL (ref 0.3–1.2)
Total Protein: 7.5 g/dL (ref 6.5–8.1)

## 2022-08-25 MED ORDER — SODIUM CHLORIDE 0.9 % IV SOLN
10.0000 mg | Freq: Once | INTRAVENOUS | Status: AC
  Administered 2022-08-25: 10 mg via INTRAVENOUS
  Filled 2022-08-25: qty 10

## 2022-08-25 MED ORDER — SODIUM CHLORIDE 0.9 % IV SOLN
375.0000 mg/m2 | Freq: Once | INTRAVENOUS | Status: AC
  Administered 2022-08-25: 850 mg via INTRAVENOUS
  Filled 2022-08-25: qty 85

## 2022-08-25 MED ORDER — SODIUM CHLORIDE 0.9 % IV SOLN
150.0000 mg | Freq: Once | INTRAVENOUS | Status: AC
  Administered 2022-08-25: 150 mg via INTRAVENOUS
  Filled 2022-08-25: qty 150

## 2022-08-25 MED ORDER — DOXORUBICIN HCL CHEMO IV INJECTION 2 MG/ML
25.0000 mg/m2 | Freq: Once | INTRAVENOUS | Status: AC
  Administered 2022-08-25: 56 mg via INTRAVENOUS
  Filled 2022-08-25: qty 28

## 2022-08-25 MED ORDER — HEPARIN SOD (PORK) LOCK FLUSH 100 UNIT/ML IV SOLN
500.0000 [IU] | Freq: Once | INTRAVENOUS | Status: AC | PRN
  Administered 2022-08-25: 500 [IU]

## 2022-08-25 MED ORDER — PALONOSETRON HCL INJECTION 0.25 MG/5ML
0.2500 mg | Freq: Once | INTRAVENOUS | Status: AC
  Administered 2022-08-25: 0.25 mg via INTRAVENOUS
  Filled 2022-08-25: qty 5

## 2022-08-25 MED ORDER — SODIUM CHLORIDE 0.9% FLUSH
10.0000 mL | INTRAVENOUS | Status: AC | PRN
  Administered 2022-08-25: 10 mL

## 2022-08-25 MED ORDER — SODIUM CHLORIDE 0.9 % IV SOLN: Freq: Once | INTRAVENOUS | Status: AC

## 2022-08-25 MED ORDER — VINBLASTINE SULFATE CHEMO INJECTION 1 MG/ML
6.0000 mg/m2 | Freq: Once | INTRAVENOUS | Status: AC
  Administered 2022-08-25: 13.6 mg via INTRAVENOUS
  Filled 2022-08-25: qty 13.6

## 2022-08-25 MED ORDER — SODIUM CHLORIDE 0.9% FLUSH
10.0000 mL | INTRAVENOUS | Status: DC | PRN
  Administered 2022-08-25: 10 mL

## 2022-08-25 NOTE — Telephone Encounter (Signed)
CRITICAL VALUE STICKER  CRITICAL VALUE: ANC 0.1  RECEIVER (on-site recipient of call): Maurine Simmering CMA  DATE & TIME NOTIFIED: 08/25/2022 1156  MESSENGER (representative from lab): Rosann Auerbach   MD NOTIFIED: Dr. Irene Limbo  TIME OF NOTIFICATION: [id number]   RESPONSE:  Notify physician

## 2022-08-25 NOTE — Progress Notes (Signed)
MD Lorenso Courier would like to hold bleomycin today  Larene Beach, PharmD

## 2022-08-25 NOTE — Patient Instructions (Signed)
[facility]  Discharge Instructions: Thank you for choosing [facility] to provide your oncology and hematology care.   If you have a lab appointment with the [facility], please go directly to the [facility] and check in at the registration area.   Wear comfortable clothing and clothing appropriate for easy access to any Portacath or PICC line.   We strive to give you quality time with your provider. You may need to reschedule your appointment if you arrive late (15 or more minutes).  Arriving late affects you and other patients whose appointments are after yours.  Also, if you miss three or more appointments without notifying the office, you may be dismissed from the clinic at the provider's discretion.      For prescription refill requests, have your pharmacy contact our office and allow 72 hours for refills to be completed.    Today you received the following chemotherapy and/or immunotherapy agents: Doxorubicin, Vinblastine, Decarbazine      To help prevent nausea and vomiting after your treatment, we encourage you to take your nausea medication as directed.  BELOW ARE SYMPTOMS THAT SHOULD BE REPORTED IMMEDIATELY: *FEVER GREATER THAN 100.4 F (38 C) OR HIGHER *CHILLS OR SWEATING *NAUSEA AND VOMITING THAT IS NOT CONTROLLED WITH YOUR NAUSEA MEDICATION *UNUSUAL SHORTNESS OF BREATH *UNUSUAL BRUISING OR BLEEDING *URINARY PROBLEMS (pain or burning when urinating, or frequent urination) *BOWEL PROBLEMS (unusual diarrhea, constipation, pain near the anus) TENDERNESS IN MOUTH AND THROAT WITH OR WITHOUT PRESENCE OF ULCERS (sore throat, sores in mouth, or a toothache) UNUSUAL RASH, SWELLING OR PAIN  UNUSUAL VAGINAL DISCHARGE OR ITCHING   Items with * indicate a potential emergency and should be followed up as soon as possible or go to the Emergency Department if any problems should occur.  Please show the CHEMOTHERAPY ALERT CARD or  IMMUNOTHERAPY ALERT CARD at check-in to the Emergency Department and triage nurse.  Should you have questions after your visit or need to cancel or reschedule your appointment, please contact [facility]  Dept: [phone]  and follow the prompts.  Office hours are 8:00 a.m. to 4:30 p.m. Monday - Friday. Please note that voicemails left after 4:00 p.m. may not be returned until the following business day.  We are closed weekends and major holidays. You have access to a nurse at all times for urgent questions. Please call the main number to the clinic Dept: [phone] and follow the prompts.   For any non-urgent questions, you may also contact your provider using MyChart. We now offer e-Visits for anyone 41 and older to request care online for non-urgent symptoms. For details visit [email or web address].   Also download the MyChart app! Go to the app store, search "MyChart", open the app, select [facility], and log in with your MyChart username and password.  Masks are optional in the cancer centers. If you would like for your care team to wear a mask while they are taking care of you, please let them know. You may have one support person who is at least 30 years old accompany you for your appointments. Doxorubicin Injection What is this medication? DOXORUBICIN (dox oh ROO bi sin) treats some types of cancer. It works by slowing down the growth of cancer cells. This medicine may be used for other purposes; ask your health care provider or pharmacist if you have questions. COMMON BRAND NAME(S): Adriamycin, Adriamycin PFS, Adriamycin RDF, Rubex What should I tell my  care team before I take this medication? They need to know if you have any of these conditions: Heart disease History of low blood cell levels caused by a medication Liver disease Recent or ongoing radiation An unusual or allergic reaction to doxorubicin, other medications, foods, dyes, or  preservatives If you or your partner are pregnant or trying to get pregnant Breast-feeding How should I use this medication? This medication is injected into a vein. It is given by your care team in a hospital or clinic setting. Talk to your care team about the use of this medication in children. Special care may be needed. Overdosage: If you think you have taken too much of this medicine contact a poison control center or emergency room at once. NOTE: This medicine is only for you. Do not share this medicine with others. What if I miss a dose? Keep appointments for follow-up doses. It is important not to miss your dose. Call your care team if you are unable to keep an appointment. What may interact with this medication? 6-mercaptopurine Paclitaxel Phenytoin St. John's wort Trastuzumab Verapamil This list may not describe all possible interactions. Give your health care provider a list of all the medicines, herbs, non-prescription drugs, or dietary supplements you use. Also tell them if you smoke, drink alcohol, or use illegal drugs. Some items may interact with your medicine. What should I watch for while using this medication? Your condition will be monitored carefully while you are receiving this medication. You may need blood work while taking this medication. This medication may make you feel generally unwell. This is not uncommon as chemotherapy can affect healthy cells as well as cancer cells. Report any side effects. Continue your course of treatment even though you feel ill unless your care team tells you to stop. There is a maximum amount of this medication you should receive throughout your life. The amount depends on the medical condition being treated and your overall health. Your care team will watch how much of this medication you receive. Tell your care team if you have taken this medication before. Your urine may turn red for a few days after your dose. This is not blood. If  your urine is dark or brown, call your care team. In some cases, you may be given additional medications to help with side effects. Follow all directions for their use. This medication may increase your risk of getting an infection. Call your care team for advice if you get a fever, chills, sore throat, or other symptoms of a cold or flu. Do not treat yourself. Try to avoid being around people who are sick. This medication may increase your risk to bruise or bleed. Call your care team if you notice any unusual bleeding. Talk to your care team about your risk of cancer. You may be more at risk for certain types of cancers if you take this medication. You should make sure that you get enough Coenzyme Q10 while you are taking this medication. Discuss the foods you eat and the vitamins you take with your care team. Talk to your care team if you or your partner may be pregnant. Serious birth defects can occur if you take this medication during pregnancy and for 6 months after the last dose. Contraception is recommended while taking this medication and for 6 months after the last dose. Your care team can help you find the option that works for you. If your partner can get pregnant, use a condom while taking  this medication and for 6 months after the last dose. Do not breastfeed while taking this medication. This medication may cause infertility. Talk to your care team if you are concerned about your fertility. What side effects may I notice from receiving this medication? Side effects that you should report to your care team as soon as possible: Allergic reactions--skin rash, itching, hives, swelling of the face, lips, tongue, or throat Heart failure--shortness of breath, swelling of the ankles, feet, or hands, sudden weight gain, unusual weakness or fatigue Heart rhythm changes--fast or irregular heartbeat, dizziness, feeling faint or lightheaded, chest pain, trouble breathing Infection--fever, chills,  cough, sore throat, wounds that don't heal, pain or trouble when passing urine, general feeling of discomfort or being unwell Low red blood cell level--unusual weakness or fatigue, dizziness, headache, trouble breathing Painful swelling, warmth, or redness of the skin, blisters or sores at the infusion site Unusual bruising or bleeding Side effects that usually do not require medical attention (report to your care team if they continue or are bothersome): Diarrhea Hair loss Nausea Pain, redness, or swelling with sores inside the mouth or throat Red urine This list may not describe all possible side effects. Call your doctor for medical advice about side effects. You may report side effects to FDA at 1-800-FDA-1088. Where should I keep my medication? This medication is given in a hospital or clinic. It will not be stored at home. NOTE: This sheet is a summary. It may not cover all possible information. If you have questions about this medicine, talk to your doctor, pharmacist, or health care provider.  2023 Elsevier/Gold Standard (2021-12-31 00:00:00) Vinblastine Injection What is this medication? VINBLASTINE (vin BLAS teen) treats some types of cancer. It works by slowing down the growth of cancer cells. This medicine may be used for other purposes; ask your health care provider or pharmacist if you have questions. COMMON BRAND NAME(S): Velban What should I tell my care team before I take this medication? They need to know if you have any of these conditions: Heart disease Infection Liver disease Low white blood cell levels Lung disease An unusual or allergic reaction to vinblastine, other chemotherapy agents, other medications, foods, dyes, or preservatives Pregnant or trying to get pregnant Breast-feeding How should I use this medication? This medication is injected into a vein. It is given by your care team in a hospital or clinic setting. Talk to your care team about the use of  this medication in children. While it may be given to children for selected conditions, precautions do apply. Overdosage: If you think you have taken too much of this medicine contact a poison control center or emergency room at once. NOTE: This medicine is only for you. Do not share this medicine with others. What if I miss a dose? Keep appointments for follow-up doses. It is important not to miss your dose. Call your care team if you are unable to keep an appointment. What may interact with this medication? Erythromycin Phenytoin This medication may affect how other medications work, and other medications may affect the way this medication works. Talk with your care team about all of the medications you take. They may suggest changes to your treatment plan to lower the risk of side effects and to make sure your medications work as intended. This list may not describe all possible interactions. Give your health care provider a list of all the medicines, herbs, non-prescription drugs, or dietary supplements you use. Also tell them if you smoke,  drink alcohol, or use illegal drugs. Some items may interact with your medicine. What should I watch for while using this medication? Your condition will be monitored carefully while you are receiving this medication. This medication may make you feel generally unwell. This is not uncommon as chemotherapy can affect healthy cells as well as cancer cells. Report any side effects. Continue your course of treatment even though you feel ill unless your care team tells you to stop. You may need blood work while taking this medication. This medication will cause constipation. If you do not have a bowel movement for 3 days, call your care team. This medication may increase your risk to bruise or bleed. Call your care team if you notice any unusual bleeding. This medication may increase your risk of getting an infection. Call your care team for advice if you get a  fever, chills, sore throat, or other symptoms of a cold or flu. Do not treat yourself. Try to avoid being around people who are sick. Be careful brushing or flossing your teeth or using a toothpick because you may get an infection or bleed more easily. If you have any dental work done, tell your dentist you are receiving this medication. Talk to your care team if you or your partner wish to become pregnant or think either of you might be pregnant. This medication can cause serious birth defects. This medication may cause infertility. Talk to your care team if you are concerned about your fertility. Talk to your care team before breastfeeding. Changes to your treatment plan may be needed. What side effects may I notice from receiving this medication? Side effects that you should report to your care team as soon as possible: Allergic reactions--skin rash, itching, hives, swelling of the face, lips, tongue, or throat Infection--fever, chills, cough, sore throat, wounds that don't heal, pain or trouble when passing urine, general feeling of discomfort or being unwell Painful swelling, warmth, or redness of the skin, blisters or sores at the infusion site Shortness of breath or trouble breathing Unusual bruising or bleeding Side effects that usually do not require medical attention (report to your care team if they continue or are bothersome): Bone pain Constipation Hair loss Nausea Stomach pain Vomiting This list may not describe all possible side effects. Call your doctor for medical advice about side effects. You may report side effects to FDA at 1-800-FDA-1088. Where should I keep my medication? This medication is given in a hospital or clinic. It will not be stored at home. NOTE: This sheet is a summary. It may not cover all possible information. If you have questions about this medicine, talk to your doctor, pharmacist, or health care provider.  2023 Elsevier/Gold Standard (2021-11-18  00:00:00) Dacarbazine Injection What is this medication? DACARBAZINE (da KAR ba zeen) treats skin cancer and lymphoma. It works by slowing down the growth of cancer cells. This medicine may be used for other purposes; ask your health care provider or pharmacist if you have questions. COMMON BRAND NAME(S): DTIC-Dome What should I tell my care team before I take this medication? They need to know if you have any of these conditions: Infection, such as chickenpox, cold sores, herpes Kidney disease Liver disease Low blood cell levels, such as low white cells, platelets, or red blood cells Recent radiation therapy An unusual or allergic reaction to dacarbazine, other medications, foods, dyes, or preservatives Pregnant or trying to get pregnant Breast-feeding How should I use this medication? This medication is given as  an injected into a vein. It is given by your care team in a hospital or clinic setting. Talk to your care team about the use of this medication in children. While it may be prescribed for selected conditions, precautions do apply. Overdosage: If you think you have taken too much of this medicine contact a poison control center or emergency room at once. NOTE: This medicine is only for you. Do not share this medicine with others. What if I miss a dose? Keep appointments for follow-up doses. It is important not to miss your dose. Call your care team if you are unable to keep an appointment. What may interact with this medication? Do not take this medication with any of the following: Live virus vaccines This medication may also interact with the following: Medications to increase blood counts, such as filgrastim, pegfilgrastim, sargramostim This list may not describe all possible interactions. Give your health care provider a list of all the medicines, herbs, non-prescription drugs, or dietary supplements you use. Also tell them if you smoke, drink alcohol, or use illegal drugs.  Some items may interact with your medicine. What should I watch for while using this medication? Your condition will be monitored carefully while you are receiving this medication. You may need blood work done while taking this medication. This medication may make you feel generally unwell. This is not uncommon as chemotherapy can affect healthy cells as well as cancer cells. Report any side effects. Continue your course of treatment even though you feel ill unless your care team tells you to stop. This medication may increase your risk of getting an infection. Call your care team for advice if you get a fever, chills, sore throat, or other symptoms of a cold or flu. Do not treat yourself. Try to avoid being around people who are sick. This medication may increase your risk to bruise or bleed. Call your care team if you notice any unusual bleeding. Talk to your care team about your risk of cancer. You may be more at risk for certain types of cancers if you take this medication. Talk to your care team if you wish to become pregnant or think you might be pregnant. This medication can cause serious birth defects if taken during pregnancy. A reliable form of contraception is recommended while taking this medication. Talk to your care team about effective forms of contraception. Do not breastfeed while taking this medication. What side effects may I notice from receiving this medication? Side effects that you should report to your care team as soon as possible: Allergic reactions--skin rash, itching, hives, swelling of the face, lips, tongue, or throat Infection--fever, chills, cough, sore throat, wounds that don't heal, pain or trouble when passing urine, general feeling of discomfort or being unwell Liver injury--right upper belly pain, loss of appetite, nausea, light-colored stool, dark yellow or brown urine, yellowing skin or eyes, unusual weakness or fatigue Low red blood cell level--unusual weakness  or fatigue, dizziness, headache, trouble breathing Painful swelling, warmth, or redness of the skin, blisters or sores at the infusion site Unusual bruising or bleeding Side effects that usually do not require medical attention (report to your care team if they continue or are bothersome): Hair loss Loss of appetite Nausea Vomiting This list may not describe all possible side effects. Call your doctor for medical advice about side effects. You may report side effects to FDA at 1-800-FDA-1088. Where should I keep my medication? This medication is given in a hospital or clinic.  It will not be stored at home. NOTE: This sheet is a summary. It may not cover all possible information. If you have questions about this medicine, talk to your doctor, pharmacist, or health care provider.  2023 Elsevier/Gold Standard (2021-12-19 00:00:00)

## 2022-08-25 NOTE — Progress Notes (Signed)
Ok to treat today per Cassie, PA with an [place] of 0.1.

## 2022-08-25 NOTE — Telephone Encounter (Signed)
LM for RT that we need PFTs for patient so we can start chemo

## 2022-08-27 ENCOUNTER — Telehealth: Payer: Self-pay

## 2022-08-27 NOTE — Telephone Encounter (Signed)
Spoke with mother to reinforce apt for LFT on 09/01/22 @ 10 AM at [facility].

## 2022-09-01 ENCOUNTER — Ambulatory Visit (HOSPITAL_COMMUNITY)
Admission: RE | Admit: 2022-09-01 | Discharge: 2022-09-01 | Disposition: A | Discharge status: Home / Self Care | Payer: Medicare Other | Source: Ambulatory Visit | Attending: Hematology | Admitting: Hematology

## 2022-09-01 DIAGNOSIS — J988 Other specified respiratory disorders: Secondary | ICD-10-CM | POA: Insufficient documentation

## 2022-09-01 DIAGNOSIS — C8192 Hodgkin lymphoma, unspecified, intrathoracic lymph nodes: Secondary | ICD-10-CM | POA: Diagnosis not present

## 2022-09-01 DIAGNOSIS — R0989 Other specified symptoms and signs involving the circulatory and respiratory systems: Secondary | ICD-10-CM | POA: Insufficient documentation

## 2022-09-01 DIAGNOSIS — R1110 Vomiting, unspecified: Secondary | ICD-10-CM | POA: Insufficient documentation

## 2022-09-01 LAB — PULMONARY FUNCTION TEST
DL/VA % pred: 104 %
DL/VA: 5.16 ml/min/mmHg/L
DLCO cor % pred: 85 %
DLCO cor: 26.25 ml/min/mmHg
DLCO unc % pred: 85 %
DLCO unc: 26.25 ml/min/mmHg
FEF 25-75 Pre: 2.99 L/sec
FEF2575-%Pred-Pre: 69 %
FEV1-%Pred-Pre: 75 %
FEV1-Pre: 3.17 L
FEV1FVC-%Pred-Pre: 95 %
FEV6-%Pred-Pre: 79 %
FEV6-Pre: 4.05 L
FEV6FVC-%Pred-Pre: 101 %
FVC-%Pred-Pre: 78 %
FVC-Pre: 4.05 L
Pre FEV1/FVC ratio: 78 %
Pre FEV6/FVC Ratio: 100 %

## 2022-09-01 MED ORDER — ALBUTEROL SULFATE (2.5 MG/3ML) 0.083% IN NEBU: 2.5000 mg | INHALATION_SOLUTION | Freq: Once | RESPIRATORY_TRACT | Status: DC

## 2022-09-01 NOTE — Progress Notes (Signed)
Patient in today for PFT study.  Unable to complete Full PFT due to patient's gagging and vomiting.  Only able to get FVC SVC and Peth portions of the study done.

## 2022-09-04 MED FILL — Dexamethasone Sodium Phosphate Inj 100 MG/10ML: INTRAMUSCULAR | Qty: 1 | Status: AC

## 2022-09-04 MED FILL — Fosaprepitant Dimeglumine For IV Infusion 150 MG (Base Eq): INTRAVENOUS | Qty: 5 | Status: AC

## 2022-09-05 ENCOUNTER — Other Ambulatory Visit: Payer: Self-pay

## 2022-09-08 ENCOUNTER — Inpatient Hospital Stay: Payer: Medicare Other

## 2022-09-08 ENCOUNTER — Inpatient Hospital Stay (HOSPITAL_BASED_OUTPATIENT_CLINIC_OR_DEPARTMENT_OTHER): Payer: Medicare Other | Admitting: Hematology

## 2022-09-08 ENCOUNTER — Other Ambulatory Visit: Payer: Self-pay

## 2022-09-08 ENCOUNTER — Inpatient Hospital Stay: Payer: Medicare Other | Attending: Hematology

## 2022-09-08 VITALS — BP 104/68 | HR 91 | Temp 98.1°F | Resp 14 | Wt 237.2 lb

## 2022-09-08 VITALS — BP 110/71 | HR 83 | Temp 98.0°F | Resp 17

## 2022-09-08 DIAGNOSIS — C8192 Hodgkin lymphoma, unspecified, intrathoracic lymph nodes: Secondary | ICD-10-CM

## 2022-09-08 DIAGNOSIS — C8179 Other classical Hodgkin lymphoma, extranodal and solid organ sites: Secondary | ICD-10-CM | POA: Insufficient documentation

## 2022-09-08 DIAGNOSIS — Z5111 Encounter for antineoplastic chemotherapy: Secondary | ICD-10-CM | POA: Diagnosis not present

## 2022-09-08 DIAGNOSIS — Z95828 Presence of other vascular implants and grafts: Secondary | ICD-10-CM

## 2022-09-08 LAB — CBC WITH DIFFERENTIAL (CANCER CENTER ONLY)
Abs Immature Granulocytes: 0.66 10*3/uL — ABNORMAL HIGH (ref 0.00–0.07)
Basophils Absolute: 0.1 10*3/uL (ref 0.0–0.1)
Basophils Relative: 2 %
Eosinophils Absolute: 0.1 10*3/uL (ref 0.0–0.5)
Eosinophils Relative: 2 %
HCT: 38 % — ABNORMAL LOW (ref 39.0–52.0)
Hemoglobin: 13.3 g/dL (ref 13.0–17.0)
Immature Granulocytes: 12 %
Lymphocytes Relative: 40 %
Lymphs Abs: 2.4 10*3/uL (ref 0.7–4.0)
MCH: 30.1 pg (ref 26.0–34.0)
MCHC: 35 g/dL (ref 30.0–36.0)
MCV: 86 fL (ref 80.0–100.0)
Monocytes Absolute: 1.3 10*3/uL — ABNORMAL HIGH (ref 0.1–1.0)
Monocytes Relative: 22 %
Neutro Abs: 1.3 10*3/uL — ABNORMAL LOW (ref 1.7–7.7)
Neutrophils Relative %: 22 %
Platelet Count: 316 10*3/uL (ref 150–400)
RBC: 4.42 MIL/uL (ref 4.22–5.81)
RDW: 11.8 % (ref 11.5–15.5)
WBC Count: 5.7 10*3/uL (ref 4.0–10.5)
nRBC: 0 % (ref 0.0–0.2)

## 2022-09-08 LAB — CMP (CANCER CENTER ONLY)
ALT: 36 U/L (ref 0–44)
AST: 32 U/L (ref 15–41)
Albumin: 3.9 g/dL (ref 3.5–5.0)
Alkaline Phosphatase: 97 U/L (ref 38–126)
Anion gap: 6 (ref 5–15)
BUN: 9 mg/dL (ref 6–20)
CO2: 30 mmol/L (ref 22–32)
Calcium: 9.4 mg/dL (ref 8.9–10.3)
Chloride: 102 mmol/L (ref 98–111)
Creatinine: 0.81 mg/dL (ref 0.61–1.24)
GFR, Estimated: >60 mL/min (ref >60)
Glucose, Bld: 103 mg/dL — ABNORMAL HIGH (ref 70–99)
Potassium: 3.7 mmol/L (ref 3.5–5.1)
Sodium: 138 mmol/L (ref 135–145)
Total Bilirubin: 0.2 mg/dL — ABNORMAL LOW (ref 0.3–1.2)
Total Protein: 7 g/dL (ref 6.5–8.1)

## 2022-09-08 MED ORDER — SODIUM CHLORIDE 0.9% FLUSH
10.0000 mL | INTRAVENOUS | Status: DC | PRN
  Administered 2022-09-08: 10 mL

## 2022-09-08 MED ORDER — DOXORUBICIN HCL CHEMO IV INJECTION 2 MG/ML
25.0000 mg/m2 | Freq: Once | INTRAVENOUS | Status: AC
  Administered 2022-09-08: 56 mg via INTRAVENOUS
  Filled 2022-09-08: qty 28

## 2022-09-08 MED ORDER — SODIUM CHLORIDE 0.9 % IV SOLN
10.0000 [IU]/m2 | Freq: Once | INTRAVENOUS | Status: AC
  Administered 2022-09-08: 23 [IU] via INTRAVENOUS
  Filled 2022-09-08: qty 7.67

## 2022-09-08 MED ORDER — SODIUM CHLORIDE 0.9 % IV SOLN
375.0000 mg/m2 | Freq: Once | INTRAVENOUS | Status: AC
  Administered 2022-09-08: 850 mg via INTRAVENOUS
  Filled 2022-09-08: qty 85

## 2022-09-08 MED ORDER — PALONOSETRON HCL INJECTION 0.25 MG/5ML
0.2500 mg | Freq: Once | INTRAVENOUS | Status: AC
  Administered 2022-09-08: 0.25 mg via INTRAVENOUS
  Filled 2022-09-08: qty 5

## 2022-09-08 MED ORDER — HEPARIN SOD (PORK) LOCK FLUSH 100 UNIT/ML IV SOLN
500.0000 [IU] | Freq: Once | INTRAVENOUS | Status: AC | PRN
  Administered 2022-09-08: 500 [IU]

## 2022-09-08 MED ORDER — SODIUM CHLORIDE 0.9 % IV SOLN
150.0000 mg | Freq: Once | INTRAVENOUS | Status: AC
  Administered 2022-09-08: 150 mg via INTRAVENOUS
  Filled 2022-09-08: qty 150

## 2022-09-08 MED ORDER — SODIUM CHLORIDE 0.9 % IV SOLN
10.0000 mg | Freq: Once | INTRAVENOUS | Status: AC
  Administered 2022-09-08: 10 mg via INTRAVENOUS
  Filled 2022-09-08: qty 10

## 2022-09-08 MED ORDER — VINBLASTINE SULFATE CHEMO INJECTION 1 MG/ML
6.0000 mg/m2 | Freq: Once | INTRAVENOUS | Status: AC
  Administered 2022-09-08: 13.6 mg via INTRAVENOUS
  Filled 2022-09-08: qty 13.6

## 2022-09-08 MED ORDER — SODIUM CHLORIDE 0.9 % IV SOLN: Freq: Once | INTRAVENOUS | Status: AC

## 2022-09-08 MED ORDER — SODIUM CHLORIDE 0.9% FLUSH
10.0000 mL | INTRAVENOUS | Status: AC | PRN
  Administered 2022-09-08: 10 mL

## 2022-09-08 NOTE — Progress Notes (Signed)
Patient seen by MD today  Vitals are within treatment parameters.  Labs reviewed: and are within treatment parameters.  Per physician team, patient is ready for treatment and there are NO modifications to the treatment plan.  

## 2022-09-08 NOTE — Progress Notes (Shared)
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 male who has been referred to [place] by .Lazoff, Shawn P, DO and Dr. Wonda Amis MD for evaluation and management of newly diagnosed mediastinal mass concerning for likely lymphoma.  Patient has a history of fetal alcohol syndrome with developmental delay, narcolepsy, ADD, exercise-induced asthma and lives with his adoptive parents. He recently presented to the hospital on 07/01/2022 with 2-week history of worsening shortness of breath cough and wheezing.  He received his asthma treatment but was still noted to have significant cough.  In the emergency room he had a chest x-ray which showed an mediastinal mass. Subsequent CT chest with contrast on 07/01/2022 showed extensive mediastinal and hilar lymphadenopathy with conglomerate soft tissue mass/adenopathy within the anterior mediastinum measuring 11.2 x 7.9 x 13 cm. Compression of the left brachiocephalic and SVC within the mid though these vascular structures remain patent.  Patient did not have any clinical signs or symptoms of SVC compression syndrome or left upper extremity swelling.  Patient subsequently had a CT of the abdomen and pelvis which showed no acute intra-abdominal or intrapelvic abnormalities. He underwent a CT-guided core needle biopsy of the mediastinal mass by interventional radiology.  The official pathology results from his biopsy are not currently available at the time of this clinic visit. I did call and talk to the pathologist Dr. Donneta Romberg and she notes that this either looks like a lymphoma or thymoma and she is running additional tests to make a final  diagnosis.  Patient's father accompanied him for this visit and his mother who helps make most of the decisions in tandem with his father was present on the phone.  They do not note any unexplained fevers chills night sweats or significant weight loss.  His breathing has been relatively stable since hospital discharge but he is still having some cough.  I confirmed adequacy of tissue sampling with the pathologist and the patient was then started on prednisone to try to help his cough by drinking his mediastinal tumor some and reducing bronchial inflammation.  Patient is unable to provide much information or review of systems on account of his developmental delay issues.  Interval History:   Paul [name] is a wonderful 32 y.o. male who is here with his father for continued evaluation and management of newly diagnosed mediastinal mass concerning for likely lymphoma. He is here to start cycle 2 day 1 of his treatment.   Patient was last seen by PA Cassandra Heilingoetter on 08/25/2022 for toxicity check. He was doing well overall without any adverse side effects. Bleomycin was still on hold since the patient still has not obtained his baseline PFT.  Patient notes he has been doing well since our last visit and reports he has been tolerating his chemotherapy well without any side effects. He denies skin rashes, abdominal pain, back pain, chest pain, shortness of breath, bone pain, abnormal bowl moments, chills, night sweats, cough, loss of appetite, or leg swelling.   His father reports that the patient had grade 1 nausea after his first cycle for one day, which resolved with nausea medications. Patient complains of mild tooth ache which causes him difficulty chewing hard food. He also complains  of hair loss.    MEDICAL HISTORY:  Past Medical History:  Diagnosis Date   ADD (attention deficit disorder)    ADHD (attention deficit hyperactivity disorder)    Asthma    excercise induced and pollen    Auditory processing disorder    Complication of anesthesia    mother states pt. is harder to put under anes. and hard to wake up due to fetal alcohol syndrome; can be combative, per mother; states he will do better if mother is in PACU when he is coming out of anes.   Development delay    states mental status of a 32 year old   Exercise-induced asthma    prn inhaler/neb.   Fetal alcohol syndrome    Narcolepsy    Non-restorable tooth 09/2015   teeth   Separation anxiety    Twin birth     SURGICAL HISTORY: Past Surgical History:  Procedure Laterality Date   IR IMAGING GUIDED PORT INSERTION  08/11/2022   MULTIPLE EXTRACTIONS WITH ALVEOLOPLASTY N/A 09/23/2015   Procedure: MULTIPLE EXTRACTION WITH ALVEOLOPLASTY;  Surgeon: Diona Browner, DDS;  Location: [facility];  Service: Oral Surgery;  Laterality: N/A;   PYLOROMYOTOMY     age [age], performed found to not have stenosis on surgical exam   PYLOROMYOTOMY     did not have pyloric stenosis; did surgery on the wrong twin   RADIOLOGY WITH ANESTHESIA N/A 01/16/2020   Procedure: MRI WITH ANESTHESIA  BRAIN WITH AND WITHOUT CONTRAST;  Surgeon: Radiologist, Medication, MD;  Location: [facility];  Service: Radiology;  Laterality: N/A;    SOCIAL HISTORY: Social History   Socioeconomic History   Marital status: Single    Spouse name: Not on file   Number of children: Not on file   Years of education: Not on file   Highest education level: Not on file  Occupational History   Not on file  Tobacco Use   Smoking status: Never   Smokeless tobacco: Never  Vaping Use   Vaping Use: Never used  Substance and Sexual Activity   Alcohol use: No   Drug use: No   Sexual activity: Not on file  Other Topics Concern   Not on file  Social History Narrative   ** Merged History Encounter **       Social Determinants of Health   Financial Resource Strain: Not on file  Food Insecurity: Not on file  Transportation Needs: Not on file   Physical Activity: Not on file  Stress: Not on file  Social Connections: Not on file  Intimate Partner Violence: Not on file    FAMILY HISTORY: Family History  Adopted: Yes  Problem Relation Age of Onset   Mental illness Mother    Diabetes Mother    Hypertension Mother    Schizophrenia Mother    Bipolar disorder Mother    Mental retardation Father     ALLERGIES:  is allergic to other.  MEDICATIONS:  Current Outpatient Medications  Medication Sig Dispense Refill   albuterol (PROVENTIL HFA;VENTOLIN HFA) 108 (90 BASE) MCG/ACT inhaler Inhale into the lungs every 6 (six) hours as needed for wheezing or shortness of breath.     albuterol (PROVENTIL) (5 MG/ML) 0.5% nebulizer solution Take 2.5 mg by nebulization every 6 (six) hours as needed for wheezing or shortness of breath.     Cholecalciferol (VITAMIN D3) 250 MCG (10000 UT) capsule Take 10,000 Units by mouth daily.     dexamethasone (DECADRON) 4 MG tablet Take 2 tablets by  mouth once a day for 3 days after chemotherapy. Take with food. 30 tablet 1   lidocaine-prilocaine (EMLA) cream Apply to affected area once 30 g 3   loratadine (CLARITIN) 10 MG tablet Take 10 mg by mouth daily.     mometasone-formoterol (DULERA) 200-5 MCG/ACT AERO Inhale 2 puffs into the lungs 2 (two) times daily. 8.8 g 0   Multiple Vitamin (MULTIVITAMIN WITH MINERALS) TABS tablet Take 1 tablet by mouth daily.     ondansetron (ZOFRAN) 4 MG tablet Take 1 tablet (4 mg total) by mouth every 6 (six) hours as needed for nausea. 20 tablet 0   prochlorperazine (COMPAZINE) 10 MG tablet Take 1 tablet (10 mg total) by mouth every 6 (six) hours as needed for nausea or vomiting. 30 tablet 1   No current facility-administered medications for this visit.    REVIEW OF SYSTEMS:    10 Point review of Systems was done is negative except as noted above.  PHYSICAL EXAMINATION  There were no vitals taken for this visit. Marland Kitchen GENERAL:alert, in no acute distress and  comfortable SKIN: no acute rashes, no significant lesions EYES: conjunctiva are pink and non-injected, sclera anicteric OROPHARYNX: MMM, no exudates, no oropharyngeal erythema or ulceration NECK: supple, no JVD LYMPH:  no palpable lymphadenopathy in the cervical, axillary or inguinal regions LUNGS: clear to auscultation b/l with normal respiratory effort HEART: regular rate & rhythm ABDOMEN:  normoactive bowel sounds , non tender, not distended. Extremity: no pedal edema PSYCH: alert & oriented x 3 with fluent speech NEURO: no focal motor/sensory deficits   LABORATORY DATA:  I have reviewed the data as listed  .    Latest Ref Rng & Units 08/25/2022   11:25 AM 08/12/2022   11:46 AM 07/01/2022    1:57 PM  CBC  WBC 4.0 - 10.5 K/uL 2.4  8.3  6.6   Hemoglobin 13.0 - 17.0 g/dL 13.5  14.5  14.4   Hematocrit 39.0 - 52.0 % 38.3  42.5  41.6   Platelets 150 - 400 K/uL 264  302  266     .    Latest Ref Rng & Units 08/25/2022   11:25 AM 08/12/2022   11:46 AM 07/01/2022    1:57 PM  CMP  Glucose 70 - 99 mg/dL 109  100  100   BUN 6 - 20 mg/dL '14  10  11   '$ Creatinine 0.61 - 1.24 mg/dL 0.79  0.80  0.94   Sodium 135 - 145 mmol/L 137  136  138   Potassium 3.5 - 5.1 mmol/L 3.7  3.9  3.9   Chloride 98 - 111 mmol/L 102  103  102   CO2 22 - 32 mmol/L '27  29  28   '$ Calcium 8.9 - 10.3 mg/dL 9.1  9.7  9.6   Total Protein 6.5 - 8.1 g/dL 7.5  7.4  7.5   Total Bilirubin 0.3 - 1.2 mg/dL 0.5  0.3  0.5   Alkaline Phos 38 - 126 U/L 104  124  113   AST 15 - 41 U/L '26  19  22   '$ ALT 0 - 44 U/L 35  22  31    . Lab Results  Component Value Date   LDH 171 07/03/2022   RADIOGRAPHIC STUDIES: I have personally reviewed the radiological images as listed and agreed with the findings in the report. IR IMAGING GUIDED PORT INSERTION  Result Date: 08/11/2022 CLINICAL DATA:  Hodgkin's lymphoma and need for porta cath for  chemotherapy. EXAM: IMPLANTED PORT A CATH PLACEMENT WITH ULTRASOUND AND FLUOROSCOPIC  GUIDANCE ANESTHESIA/SEDATION: Moderate (conscious) sedation was employed during this procedure. A total of Versed 3.0 mg and Fentanyl 200 mcg was administered intravenously by radiology nursing. Moderate Sedation Time: 32 minutes. The patient's level of consciousness and vital signs were monitored continuously by radiology nursing throughout the procedure under my direct supervision. FLUOROSCOPY: 4.0 mGy PROCEDURE: The procedure, risks, benefits, and alternatives were explained to the patient. Questions regarding the procedure were encouraged and answered. The patient understands and consents to the procedure. A time-out was performed prior to initiating the procedure. Ultrasound was utilized to confirm patency of the right internal jugular vein. A permanent ultrasound image was recorded and saved. The right neck and chest were prepped with chlorhexidine in a sterile fashion, and a sterile drape was applied covering the operative field. Maximum barrier sterile technique with sterile gowns and gloves were used for the procedure. Local anesthesia was provided with 1% lidocaine. After creating a small venotomy incision, a 21 gauge needle was advanced into the right internal jugular vein under direct, real-time ultrasound guidance. Ultrasound image documentation was performed. After securing guidewire access, an 8 Fr dilator was placed. A J-wire was kinked to measure appropriate catheter length. A subcutaneous port pocket was then created along the upper chest wall utilizing sharp and blunt dissection. Portable cautery was utilized. The pocket was irrigated with sterile saline. A single lumen power injectable port was chosen for placement. The 8 Fr catheter was tunneled from the port pocket site to the venotomy incision. The port was placed in the pocket. External catheter was trimmed to appropriate length based on guidewire measurement. At the venotomy, an 8 Fr peel-away sheath was placed over a guidewire. The catheter  was then placed through the sheath and the sheath removed. Final catheter positioning was confirmed and documented with a fluoroscopic spot image. The port was accessed with a needle and aspirated and flushed with heparinized saline. The access needle was removed. The venotomy and port pocket incisions were closed with subcutaneous 3-0 Monocryl and subcuticular 4-0 Vicryl. Dermabond was applied to both incisions. COMPLICATIONS: COMPLICATIONS None FINDINGS: After catheter placement, the tip lies at the cavo-atrial junction. The catheter aspirates normally and is ready for immediate use. IMPRESSION: Placement of single lumen port a cath via right internal jugular vein. The catheter tip lies at the cavo-atrial junction. A power injectable port a cath was placed and is ready for immediate use. Electronically Signed   By: Aletta Edouard M.D.   On: 08/11/2022 15:41    Surgical Pathology result from 07/03/2022: FINAL MICROSCOPIC DIAGNOSIS:  A. MEDIASTINAL MASS, ANTERIOR, NEEDLE CORE BIOPSY: -Atypical lymphoid proliferation consistent with classical Hodgkin lymphoma -See comment  COMMENT:  The sections show needle core biopsy fragments displaying a nodular and diffuse lymphoid proliferation associated with dense sclerosis.  The lymphoid process shows predominance of small lymphoid cells admixed to a lesser extent with histiocytes, eosinophils in addition to the large atypical mononuclear and occasionally lobated lymphoid appearing cells with variably prominent nucleoli.  Flow cytometric analysis was performed [facility] [phone]) and shows predominance of CD4-positive T cells. No monoclonal B-cell population identified.  In addition, a battery of immunohistochemical stains was performed including CD30, CD15, mum 1, LCA, CD20, PAX5, CD3, CD5, CD4, CD8, TdT, cytokeratin AE1/AE3, cytokeratin 8/18 and EBV in situ hybridization with appropriate controls.  The large atypical lymphoid appearing cells are positive  for CD15, CD30, PAX5, mum 1 and rare cells for CD20.  No significant staining is seen with LCA, EBV, CD3, CD5, CD4, CD8.  Cytokeratin stains highlight a small focus of positivity likely representing native epithelial elements.  No significant TdT positivity is identified.  The small lymphocytes in the background show a mixture of T and B cells with predominance of T cells.  The latter show predominance of CD4 positive cells.  Overall, the features are atypical and most consistent with classical Hodgkin lymphoma which is best subclassified as nodular sclerosis type.   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  Less likely thymoma. Given the patient's age most likely differential would include Hodgkin's lymphoma and primary mediastinal large B-cell lymphoma. The mass is causing some compression of his SVC and left brachiocephalic vein but no complete obstruction or symptoms related to this. He does not note normal his parents any obvious constitutional symptoms.  Plan: -Discussed lab results from today, 09/08/2022, with the patient and his father. CBC shows slightly decreased hematocrit of 38.0. CMP is pending.  -He can proceed with his treatment, cycle 2 day 1, today. We will add Bleomycin with his current treatment since PFT did not show any abnormalities.  -No severe toxicity with his first treatment   Follow-up: ***  The total time spent in the appointment was *** minutes* .  All of the patient's questions were answered with apparent satisfaction. The patient knows to call the clinic with any problems, questions or concerns.   Sullivan Lone MD MS AAHIVMS [facility] Hematology/Oncology Physician [facility]  .*Total Encounter Time as defined  by the Centers for Medicare and Medicaid Services includes, in addition to the face-to-face time of a patient visit (documented in the note above) non-face-to-face time: obtaining and reviewing outside history, ordering and reviewing medications, tests or procedures, care coordination (communications with other health care professionals or caregivers) and documentation in the medical record.  Zettie Cooley, am acting as a Education administrator for Sullivan Lone, MD.

## 2022-09-08 NOTE — Patient Instructions (Signed)
[facility]  Discharge Instructions: Thank you for choosing [facility] to provide your oncology and hematology care.   If you have a lab appointment with the [facility], please go directly to the [facility] and check in at the registration area.   Wear comfortable clothing and clothing appropriate for easy access to any Portacath or PICC line.   We strive to give you quality time with your provider. You may need to reschedule your appointment if you arrive late (15 or more minutes).  Arriving late affects you and other patients whose appointments are after yours.  Also, if you miss three or more appointments without notifying the office, you may be dismissed from the clinic at the provider's discretion.      For prescription refill requests, have your pharmacy contact our office and allow 72 hours for refills to be completed.    Today you received the following chemotherapy and/or immunotherapy agents: Doxorubicin, Vinblastine, Bleomycin,  Decarbazine      To help prevent nausea and vomiting after your treatment, we encourage you to take your nausea medication as directed.  BELOW ARE SYMPTOMS THAT SHOULD BE REPORTED IMMEDIATELY: *FEVER GREATER THAN 100.4 F (38 C) OR HIGHER *CHILLS OR SWEATING *NAUSEA AND VOMITING THAT IS NOT CONTROLLED WITH YOUR NAUSEA MEDICATION *UNUSUAL SHORTNESS OF BREATH *UNUSUAL BRUISING OR BLEEDING *URINARY PROBLEMS (pain or burning when urinating, or frequent urination) *BOWEL PROBLEMS (unusual diarrhea, constipation, pain near the anus) TENDERNESS IN MOUTH AND THROAT WITH OR WITHOUT PRESENCE OF ULCERS (sore throat, sores in mouth, or a toothache) UNUSUAL RASH, SWELLING OR PAIN  UNUSUAL VAGINAL DISCHARGE OR ITCHING   Items with * indicate a potential emergency and should be followed up as soon as possible or go to the Emergency Department if any problems should occur.  Please show the CHEMOTHERAPY ALERT CARD or  IMMUNOTHERAPY ALERT CARD at check-in to the Emergency Department and triage nurse.  Should you have questions after your visit or need to cancel or reschedule your appointment, please contact [facility]  Dept: [phone]  and follow the prompts.  Office hours are 8:00 a.m. to 4:30 p.m. Monday - Friday. Please note that voicemails left after 4:00 p.m. may not be returned until the following business day.  We are closed weekends and major holidays. You have access to a nurse at all times for urgent questions. Please call the main number to the clinic Dept: [phone] and follow the prompts.   For any non-urgent questions, you may also contact your provider using MyChart. We now offer e-Visits for anyone 31 and older to request care online for non-urgent symptoms. For details visit [email or web address].   Also download the MyChart app! Go to the app store, search "MyChart", open the app, select [facility], and log in with your MyChart username and password.  Masks are optional in the cancer centers. If you would like for your care team to wear a mask while they are taking care of you, please let them know. You may have one support person who is at least 31 years old accompany you for your appointments. Doxorubicin Injection What is this medication? DOXORUBICIN (dox oh ROO bi sin) treats some types of cancer. It works by slowing down the growth of cancer cells. This medicine may be used for other purposes; ask your health care provider or pharmacist if you have questions. COMMON BRAND NAME(S): Adriamycin, Adriamycin PFS, Adriamycin RDF, Rubex What should I  tell my care team before I take this medication? They need to know if you have any of these conditions: Heart disease History of low blood cell levels caused by a medication Liver disease Recent or ongoing radiation An unusual or allergic reaction to doxorubicin, other medications, foods, dyes, or  preservatives If you or your partner are pregnant or trying to get pregnant Breast-feeding How should I use this medication? This medication is injected into a vein. It is given by your care team in a hospital or clinic setting. Talk to your care team about the use of this medication in children. Special care may be needed. Overdosage: If you think you have taken too much of this medicine contact a poison control center or emergency room at once. NOTE: This medicine is only for you. Do not share this medicine with others. What if I miss a dose? Keep appointments for follow-up doses. It is important not to miss your dose. Call your care team if you are unable to keep an appointment. What may interact with this medication? 6-mercaptopurine Paclitaxel Phenytoin St. John's wort Trastuzumab Verapamil This list may not describe all possible interactions. Give your health care provider a list of all the medicines, herbs, non-prescription drugs, or dietary supplements you use. Also tell them if you smoke, drink alcohol, or use illegal drugs. Some items may interact with your medicine. What should I watch for while using this medication? Your condition will be monitored carefully while you are receiving this medication. You may need blood work while taking this medication. This medication may make you feel generally unwell. This is not uncommon as chemotherapy can affect healthy cells as well as cancer cells. Report any side effects. Continue your course of treatment even though you feel ill unless your care team tells you to stop. There is a maximum amount of this medication you should receive throughout your life. The amount depends on the medical condition being treated and your overall health. Your care team will watch how much of this medication you receive. Tell your care team if you have taken this medication before. Your urine may turn red for a few days after your dose. This is not blood. If  your urine is dark or brown, call your care team. In some cases, you may be given additional medications to help with side effects. Follow all directions for their use. This medication may increase your risk of getting an infection. Call your care team for advice if you get a fever, chills, sore throat, or other symptoms of a cold or flu. Do not treat yourself. Try to avoid being around people who are sick. This medication may increase your risk to bruise or bleed. Call your care team if you notice any unusual bleeding. Talk to your care team about your risk of cancer. You may be more at risk for certain types of cancers if you take this medication. You should make sure that you get enough Coenzyme Q10 while you are taking this medication. Discuss the foods you eat and the vitamins you take with your care team. Talk to your care team if you or your partner may be pregnant. Serious birth defects can occur if you take this medication during pregnancy and for 6 months after the last dose. Contraception is recommended while taking this medication and for 6 months after the last dose. Your care team can help you find the option that works for you. If your partner can get pregnant, use a condom  while taking this medication and for 6 months after the last dose. Do not breastfeed while taking this medication. This medication may cause infertility. Talk to your care team if you are concerned about your fertility. What side effects may I notice from receiving this medication? Side effects that you should report to your care team as soon as possible: Allergic reactions--skin rash, itching, hives, swelling of the face, lips, tongue, or throat Heart failure--shortness of breath, swelling of the ankles, feet, or hands, sudden weight gain, unusual weakness or fatigue Heart rhythm changes--fast or irregular heartbeat, dizziness, feeling faint or lightheaded, chest pain, trouble breathing Infection--fever, chills,  cough, sore throat, wounds that don't heal, pain or trouble when passing urine, general feeling of discomfort or being unwell Low red blood cell level--unusual weakness or fatigue, dizziness, headache, trouble breathing Painful swelling, warmth, or redness of the skin, blisters or sores at the infusion site Unusual bruising or bleeding Side effects that usually do not require medical attention (report to your care team if they continue or are bothersome): Diarrhea Hair loss Nausea Pain, redness, or swelling with sores inside the mouth or throat Red urine This list may not describe all possible side effects. Call your doctor for medical advice about side effects. You may report side effects to FDA at 1-800-FDA-1088. Where should I keep my medication? This medication is given in a hospital or clinic. It will not be stored at home. NOTE: This sheet is a summary. It may not cover all possible information. If you have questions about this medicine, talk to your doctor, pharmacist, or health care provider.  2023 Elsevier/Gold Standard (2021-12-31 00:00:00) Vinblastine Injection What is this medication? VINBLASTINE (vin BLAS teen) treats some types of cancer. It works by slowing down the growth of cancer cells. This medicine may be used for other purposes; ask your health care provider or pharmacist if you have questions. COMMON BRAND NAME(S): Velban What should I tell my care team before I take this medication? They need to know if you have any of these conditions: Heart disease Infection Liver disease Low white blood cell levels Lung disease An unusual or allergic reaction to vinblastine, other chemotherapy agents, other medications, foods, dyes, or preservatives Pregnant or trying to get pregnant Breast-feeding How should I use this medication? This medication is injected into a vein. It is given by your care team in a hospital or clinic setting. Talk to your care team about the use of  this medication in children. While it may be given to children for selected conditions, precautions do apply. Overdosage: If you think you have taken too much of this medicine contact a poison control center or emergency room at once. NOTE: This medicine is only for you. Do not share this medicine with others. What if I miss a dose? Keep appointments for follow-up doses. It is important not to miss your dose. Call your care team if you are unable to keep an appointment. What may interact with this medication? Erythromycin Phenytoin This medication may affect how other medications work, and other medications may affect the way this medication works. Talk with your care team about all of the medications you take. They may suggest changes to your treatment plan to lower the risk of side effects and to make sure your medications work as intended. This list may not describe all possible interactions. Give your health care provider a list of all the medicines, herbs, non-prescription drugs, or dietary supplements you use. Also tell them if  you smoke, drink alcohol, or use illegal drugs. Some items may interact with your medicine. What should I watch for while using this medication? Your condition will be monitored carefully while you are receiving this medication. This medication may make you feel generally unwell. This is not uncommon as chemotherapy can affect healthy cells as well as cancer cells. Report any side effects. Continue your course of treatment even though you feel ill unless your care team tells you to stop. You may need blood work while taking this medication. This medication will cause constipation. If you do not have a bowel movement for 3 days, call your care team. This medication may increase your risk to bruise or bleed. Call your care team if you notice any unusual bleeding. This medication may increase your risk of getting an infection. Call your care team for advice if you get a  fever, chills, sore throat, or other symptoms of a cold or flu. Do not treat yourself. Try to avoid being around people who are sick. Be careful brushing or flossing your teeth or using a toothpick because you may get an infection or bleed more easily. If you have any dental work done, tell your dentist you are receiving this medication. Talk to your care team if you or your partner wish to become pregnant or think either of you might be pregnant. This medication can cause serious birth defects. This medication may cause infertility. Talk to your care team if you are concerned about your fertility. Talk to your care team before breastfeeding. Changes to your treatment plan may be needed. What side effects may I notice from receiving this medication? Side effects that you should report to your care team as soon as possible: Allergic reactions--skin rash, itching, hives, swelling of the face, lips, tongue, or throat Infection--fever, chills, cough, sore throat, wounds that don't heal, pain or trouble when passing urine, general feeling of discomfort or being unwell Painful swelling, warmth, or redness of the skin, blisters or sores at the infusion site Shortness of breath or trouble breathing Unusual bruising or bleeding Side effects that usually do not require medical attention (report to your care team if they continue or are bothersome): Bone pain Constipation Hair loss Nausea Stomach pain Vomiting This list may not describe all possible side effects. Call your doctor for medical advice about side effects. You may report side effects to FDA at 1-800-FDA-1088. Where should I keep my medication? This medication is given in a hospital or clinic. It will not be stored at home. NOTE: This sheet is a summary. It may not cover all possible information. If you have questions about this medicine, talk to your doctor, pharmacist, or health care provider.  2023 Elsevier/Gold Standard (2021-11-18  00:00:00) Dacarbazine Injection What is this medication? DACARBAZINE (da KAR ba zeen) treats skin cancer and lymphoma. It works by slowing down the growth of cancer cells. This medicine may be used for other purposes; ask your health care provider or pharmacist if you have questions. COMMON BRAND NAME(S): DTIC-Dome What should I tell my care team before I take this medication? They need to know if you have any of these conditions: Infection, such as chickenpox, cold sores, herpes Kidney disease Liver disease Low blood cell levels, such as low white cells, platelets, or red blood cells Recent radiation therapy An unusual or allergic reaction to dacarbazine, other medications, foods, dyes, or preservatives Pregnant or trying to get pregnant Breast-feeding How should I use this medication? This medication is  given as an injected into a vein. It is given by your care team in a hospital or clinic setting. Talk to your care team about the use of this medication in children. While it may be prescribed for selected conditions, precautions do apply. Overdosage: If you think you have taken too much of this medicine contact a poison control center or emergency room at once. NOTE: This medicine is only for you. Do not share this medicine with others. What if I miss a dose? Keep appointments for follow-up doses. It is important not to miss your dose. Call your care team if you are unable to keep an appointment. What may interact with this medication? Do not take this medication with any of the following: Live virus vaccines This medication may also interact with the following: Medications to increase blood counts, such as filgrastim, pegfilgrastim, sargramostim This list may not describe all possible interactions. Give your health care provider a list of all the medicines, herbs, non-prescription drugs, or dietary supplements you use. Also tell them if you smoke, drink alcohol, or use illegal drugs.  Some items may interact with your medicine. What should I watch for while using this medication? Your condition will be monitored carefully while you are receiving this medication. You may need blood work done while taking this medication. This medication may make you feel generally unwell. This is not uncommon as chemotherapy can affect healthy cells as well as cancer cells. Report any side effects. Continue your course of treatment even though you feel ill unless your care team tells you to stop. This medication may increase your risk of getting an infection. Call your care team for advice if you get a fever, chills, sore throat, or other symptoms of a cold or flu. Do not treat yourself. Try to avoid being around people who are sick. This medication may increase your risk to bruise or bleed. Call your care team if you notice any unusual bleeding. Talk to your care team about your risk of cancer. You may be more at risk for certain types of cancers if you take this medication. Talk to your care team if you wish to become pregnant or think you might be pregnant. This medication can cause serious birth defects if taken during pregnancy. A reliable form of contraception is recommended while taking this medication. Talk to your care team about effective forms of contraception. Do not breastfeed while taking this medication. What side effects may I notice from receiving this medication? Side effects that you should report to your care team as soon as possible: Allergic reactions--skin rash, itching, hives, swelling of the face, lips, tongue, or throat Infection--fever, chills, cough, sore throat, wounds that don't heal, pain or trouble when passing urine, general feeling of discomfort or being unwell Liver injury--right upper belly pain, loss of appetite, nausea, light-colored stool, dark yellow or brown urine, yellowing skin or eyes, unusual weakness or fatigue Low red blood cell level--unusual weakness  or fatigue, dizziness, headache, trouble breathing Painful swelling, warmth, or redness of the skin, blisters or sores at the infusion site Unusual bruising or bleeding Side effects that usually do not require medical attention (report to your care team if they continue or are bothersome): Hair loss Loss of appetite Nausea Vomiting This list may not describe all possible side effects. Call your doctor for medical advice about side effects. You may report side effects to FDA at 1-800-FDA-1088. Where should I keep my medication? This medication is given in a hospital  or clinic. It will not be stored at home. NOTE: This sheet is a summary. It may not cover all possible information. If you have questions about this medicine, talk to your doctor, pharmacist, or health care provider.  2023 Elsevier/Gold Standard (2021-12-19 00:00:00)

## 2022-09-08 NOTE — Progress Notes (Signed)
Per Dr Irene Limbo pt is ok to start tx without ANC result today. Per Dr Irene Limbo WBC is WNL.

## 2022-09-09 ENCOUNTER — Encounter: Payer: Self-pay | Admitting: Hematology

## 2022-09-09 MED ORDER — ONDANSETRON HCL 4 MG PO TABS: 4.0000 mg | ORAL_TABLET | Freq: Four times a day (QID) | ORAL | 0 refills | Status: DC | PRN

## 2022-09-09 MED ORDER — DEXAMETHASONE 4 MG PO TABS: ORAL_TABLET | ORAL | 1 refills | Status: DC

## 2022-09-10 ENCOUNTER — Other Ambulatory Visit: Payer: Self-pay

## 2022-09-11 ENCOUNTER — Other Ambulatory Visit: Payer: Self-pay

## 2022-09-16 NOTE — Progress Notes (Unsigned)
PATIENT: Paul [name] DOB: 25-May-1991  REASON FOR VISIT: follow up HISTORY FROM: patient PRIMARY NEUROLOGIST: Dr. Frances Furbish  Chief Complaint  Patient presents with   Follow-up    Pt in 19 with dad  ;Pt states no questions or concerns for this visit  Pt sates he was diagnosed with cancer      HISTORY OF PRESENT ILLNESS: Today 09/17/22:  Paul [name] is a 32 y.o. male with a history of OSA on CPAP. Returns today for follow-up. Here today with his dad.  He states that he does not use his CPAP consistently because he sleeps in his mother's room so he can get up and help her during the night if she needs it.  He was recently diagnosed with lymphoma and is going through 6 months of treatment.  He returns today for evaluation.    REVIEW OF SYSTEMS: Out of a complete 14 system review of symptoms, the patient complains only of the following symptoms, and all other reviewed systems are negative.    ALLERGIES: Allergies  Allergen Reactions   Other     Fetal alcohol syndrome  Certain medications have adverse affect for him. Must have heavy anesthesia meds in order to work.    HOME MEDICATIONS: Outpatient Medications Prior to Visit  Medication Sig Dispense Refill   albuterol (PROVENTIL HFA;VENTOLIN HFA) 108 (90 BASE) MCG/ACT inhaler Inhale into the lungs every 6 (six) hours as needed for wheezing or shortness of breath.     albuterol (PROVENTIL) (5 MG/ML) 0.5% nebulizer solution Take 2.5 mg by nebulization every 6 (six) hours as needed for wheezing or shortness of breath.     Cholecalciferol (VITAMIN D3) 250 MCG (10000 UT) capsule Take 10,000 Units by mouth daily.     dexamethasone (DECADRON) 4 MG tablet Take 2 tablets by mouth once a day for 3 days after chemotherapy. Take with food. 30 tablet 1   lidocaine-prilocaine (EMLA) cream Apply to affected area once 30 g 3   loratadine (CLARITIN) 10 MG tablet Take 10 mg by mouth daily.     mometasone-formoterol (DULERA) 200-5 MCG/ACT  AERO Inhale 2 puffs into the lungs 2 (two) times daily. 8.8 g 0   Multiple Vitamin (MULTIVITAMIN WITH MINERALS) TABS tablet Take 1 tablet by mouth daily.     ondansetron (ZOFRAN) 4 MG tablet Take 1 tablet (4 mg total) by mouth every 6 (six) hours as needed for nausea. 20 tablet 0   prochlorperazine (COMPAZINE) 10 MG tablet Take 1 tablet (10 mg total) by mouth every 6 (six) hours as needed for nausea or vomiting. 30 tablet 1   No facility-administered medications prior to visit.    PAST MEDICAL HISTORY: Past Medical History:  Diagnosis Date   ADD (attention deficit disorder)    ADHD (attention deficit hyperactivity disorder)    Asthma    excercise induced and pollen   Auditory processing disorder    Complication of anesthesia    mother states pt. is harder to put under anes. and hard to wake up due to fetal alcohol syndrome; can be combative, per mother; states he will do better if mother is in PACU when he is coming out of anes.   Development delay    states mental status of a 32 year old   Exercise-induced asthma    prn inhaler/neb.   Fetal alcohol syndrome    Narcolepsy    Non-restorable tooth 09/2015   teeth   Separation anxiety    Twin birth  PAST SURGICAL HISTORY: Past Surgical History:  Procedure Laterality Date   IR IMAGING GUIDED PORT INSERTION  08/11/2022   MULTIPLE EXTRACTIONS WITH ALVEOLOPLASTY N/A 09/23/2015   Procedure: MULTIPLE EXTRACTION WITH ALVEOLOPLASTY;  Surgeon: Ocie Doyne, DDS;  Location: [facility];  Service: Oral Surgery;  Laterality: N/A;   PYLOROMYOTOMY     age [age], performed found to not have stenosis on surgical exam   PYLOROMYOTOMY     did not have pyloric stenosis; did surgery on the wrong twin   RADIOLOGY WITH ANESTHESIA N/A 01/16/2020   Procedure: MRI WITH ANESTHESIA  BRAIN WITH AND WITHOUT CONTRAST;  Surgeon: Radiologist, Medication, MD;  Location: [facility];  Service: Radiology;  Laterality: N/A;    FAMILY HISTORY: Family  History  Adopted: Yes  Problem Relation Age of Onset   Mental illness Mother    Diabetes Mother    Hypertension Mother    Schizophrenia Mother    Bipolar disorder Mother    Mental retardation Father    Sleep apnea Neg Hx     SOCIAL HISTORY: Social History   Socioeconomic History   Marital status: Single    Spouse name: Not on file   Number of children: Not on file   Years of education: Not on file   Highest education level: Not on file  Occupational History   Not on file  Tobacco Use   Smoking status: Never   Smokeless tobacco: Never  Vaping Use   Vaping Use: Never used  Substance and Sexual Activity   Alcohol use: No   Drug use: No   Sexual activity: Not on file  Other Topics Concern   Not on file  Social History Narrative   ** Merged History Encounter **       Social Determinants of Health   Financial Resource Strain: Not on file  Food Insecurity: Not on file  Transportation Needs: Not on file  Physical Activity: Not on file  Stress: Not on file  Social Connections: Not on file  Intimate Partner Violence: Not on file      PHYSICAL EXAM  Vitals:   09/17/22 1421  BP: 123/75  Pulse: 91  Weight: 238 lb 12.8 oz (108.3 kg)  Height: 5\' 10"  (1.778 m)   Body mass index is 34.26 kg/m.  Generalized: Well developed, in no acute distress  Chest: Lungs clear to auscultation bilaterally  Neurological examination  Mentation: Alert oriented to time, place, history taking. Follows all commands speech and language fluent Cranial nerve II-XII: Extraocular movements were full, visual field were full on confrontational test Head turning and shoulder shrug  were normal and symmetric. Gait and station: Gait is normal.    DIAGNOSTIC DATA (LABS, IMAGING, TESTING) - I reviewed patient records, labs, notes, testing and imaging myself where available.  Lab Results  Component Value Date   WBC 5.7 09/08/2022   HGB 13.3 09/08/2022   HCT 38.0 (L) 09/08/2022   MCV 86.0  09/08/2022   PLT 316 09/08/2022      Component Value Date/Time   NA 138 09/08/2022 1159   K 3.7 09/08/2022 1159   CL 102 09/08/2022 1159   CO2 30 09/08/2022 1159   GLUCOSE 103 (H) 09/08/2022 1159   BUN 9 09/08/2022 1159   CREATININE 0.81 09/08/2022 1159   CALCIUM 9.4 09/08/2022 1159   PROT 7.0 09/08/2022 1159   ALBUMIN 3.9 09/08/2022 1159   AST 32 09/08/2022 1159   ALT 36 09/08/2022 1159   ALKPHOS 97 09/08/2022 1159  BILITOT 0.2 (L) 09/08/2022 1159   GFRNONAA >60 09/08/2022 1159   No results found for: "CHOL", "HDL", "LDLCALC", "LDLDIRECT", "TRIG", "CHOLHDL" No results found for: "HGBA1C" No results found for: "VITAMINB12" Lab Results  Component Value Date   TSH 1.353 07/01/2022      ASSESSMENT AND PLAN 32 y.o. year old male  has a past medical history of ADD (attention deficit disorder), ADHD (attention deficit hyperactivity disorder), Asthma, Auditory processing disorder, Complication of anesthesia, Development delay, Exercise-induced asthma, Fetal alcohol syndrome, Narcolepsy, Non-restorable tooth (09/2015), Separation anxiety, and Twin birth. here with:  OSA on CPAP  - Noncompliance - Encourage patient to use CPAP nightly and > 4 hours each night -Order placed for new supplies - F/U in 1 year or sooner if needed     Butch Penny, MSN, NP-C 09/17/2022, 2:27 PM [facility] [address] [place], [place] [address] [phone]

## 2022-09-17 ENCOUNTER — Ambulatory Visit (INDEPENDENT_AMBULATORY_CARE_PROVIDER_SITE_OTHER): Payer: Medicare Other | Admitting: Adult Health

## 2022-09-17 ENCOUNTER — Encounter: Payer: Self-pay | Admitting: Adult Health

## 2022-09-17 VITALS — BP 123/75 | HR 91 | Ht 70.0 in | Wt 238.8 lb

## 2022-09-17 DIAGNOSIS — G4733 Obstructive sleep apnea (adult) (pediatric): Secondary | ICD-10-CM

## 2022-09-21 MED FILL — Dexamethasone Sodium Phosphate Inj 100 MG/10ML: INTRAMUSCULAR | Qty: 1 | Status: AC

## 2022-09-21 MED FILL — Fosaprepitant Dimeglumine For IV Infusion 150 MG (Base Eq): INTRAVENOUS | Qty: 5 | Status: AC

## 2022-09-21 NOTE — Progress Notes (Signed)
New, Willodean Rosenthal, RN; Alonna Minium; Minus Liberty; Nash Shearer Received, Thank you!     Previous Messages    ----- Message ----- From: Brandon Melnick, RN Sent: 09/21/2022   9:14 AM EST To: Darlina Guys; Miquel Dunn; Nash Shearer; * Subject: needs new supplies                            Order in [place]. Sinn Male, 32 y.o., Feb 09, 1991 MRN: [id number]   Needs new supplies.    Thanks,  Acupuncturist

## 2022-09-22 ENCOUNTER — Inpatient Hospital Stay: Payer: Medicare Other

## 2022-09-22 ENCOUNTER — Other Ambulatory Visit: Payer: Self-pay

## 2022-09-22 ENCOUNTER — Inpatient Hospital Stay (HOSPITAL_BASED_OUTPATIENT_CLINIC_OR_DEPARTMENT_OTHER): Payer: Medicare Other | Admitting: Hematology

## 2022-09-22 VITALS — BP 109/74 | HR 89 | Temp 97.9°F | Resp 20 | Wt 239.1 lb

## 2022-09-22 DIAGNOSIS — C8102 Nodular lymphocyte predominant Hodgkin lymphoma, intrathoracic lymph nodes: Secondary | ICD-10-CM

## 2022-09-22 DIAGNOSIS — C8192 Hodgkin lymphoma, unspecified, intrathoracic lymph nodes: Secondary | ICD-10-CM

## 2022-09-22 DIAGNOSIS — Z95828 Presence of other vascular implants and grafts: Secondary | ICD-10-CM

## 2022-09-22 DIAGNOSIS — Z5111 Encounter for antineoplastic chemotherapy: Secondary | ICD-10-CM | POA: Diagnosis not present

## 2022-09-22 LAB — CBC WITH DIFFERENTIAL (CANCER CENTER ONLY)
Abs Immature Granulocytes: 0.28 10*3/uL — ABNORMAL HIGH (ref 0.00–0.07)
Basophils Absolute: 0.1 10*3/uL (ref 0.0–0.1)
Basophils Relative: 1 %
Eosinophils Absolute: 0.1 10*3/uL (ref 0.0–0.5)
Eosinophils Relative: 3 %
HCT: 36.3 % — ABNORMAL LOW (ref 39.0–52.0)
Hemoglobin: 12.9 g/dL — ABNORMAL LOW (ref 13.0–17.0)
Immature Granulocytes: 7 %
Lymphocytes Relative: 52 %
Lymphs Abs: 2.3 10*3/uL (ref 0.7–4.0)
MCH: 30.4 pg (ref 26.0–34.0)
MCHC: 35.5 g/dL (ref 30.0–36.0)
MCV: 85.6 fL (ref 80.0–100.0)
Monocytes Absolute: 1.1 10*3/uL — ABNORMAL HIGH (ref 0.1–1.0)
Monocytes Relative: 26 %
Neutro Abs: 0.5 10*3/uL — ABNORMAL LOW (ref 1.7–7.7)
Neutrophils Relative %: 11 %
Platelet Count: 293 10*3/uL (ref 150–400)
RBC: 4.24 MIL/uL (ref 4.22–5.81)
RDW: 12.5 % (ref 11.5–15.5)
Smear Review: NORMAL
WBC Count: 4.2 10*3/uL (ref 4.0–10.5)
nRBC: 0 % (ref 0.0–0.2)

## 2022-09-22 LAB — CMP (CANCER CENTER ONLY)
ALT: 28 U/L (ref 0–44)
AST: 25 U/L (ref 15–41)
Albumin: 3.8 g/dL (ref 3.5–5.0)
Alkaline Phosphatase: 105 U/L (ref 38–126)
Anion gap: 6 (ref 5–15)
BUN: 9 mg/dL (ref 6–20)
CO2: 29 mmol/L (ref 22–32)
Calcium: 9.4 mg/dL (ref 8.9–10.3)
Chloride: 104 mmol/L (ref 98–111)
Creatinine: 0.76 mg/dL (ref 0.61–1.24)
GFR, Estimated: >60 mL/min (ref >60)
Glucose, Bld: 95 mg/dL (ref 70–99)
Potassium: 3.6 mmol/L (ref 3.5–5.1)
Sodium: 139 mmol/L (ref 135–145)
Total Bilirubin: 0.2 mg/dL — ABNORMAL LOW (ref 0.3–1.2)
Total Protein: 6.7 g/dL (ref 6.5–8.1)

## 2022-09-22 MED ORDER — HEPARIN SOD (PORK) LOCK FLUSH 100 UNIT/ML IV SOLN
500.0000 [IU] | Freq: Once | INTRAVENOUS | Status: AC | PRN
  Administered 2022-09-22: 500 [IU]

## 2022-09-22 MED ORDER — PALONOSETRON HCL INJECTION 0.25 MG/5ML
0.2500 mg | Freq: Once | INTRAVENOUS | Status: AC
  Administered 2022-09-22: 0.25 mg via INTRAVENOUS
  Filled 2022-09-22: qty 5

## 2022-09-22 MED ORDER — SODIUM CHLORIDE 0.9 % IV SOLN
10.0000 mg | Freq: Once | INTRAVENOUS | Status: AC
  Administered 2022-09-22: 10 mg via INTRAVENOUS
  Filled 2022-09-22: qty 10

## 2022-09-22 MED ORDER — SODIUM CHLORIDE 0.9 % IV SOLN
375.0000 mg/m2 | Freq: Once | INTRAVENOUS | Status: AC
  Administered 2022-09-22: 850 mg via INTRAVENOUS
  Filled 2022-09-22: qty 85

## 2022-09-22 MED ORDER — SODIUM CHLORIDE 0.9% FLUSH
10.0000 mL | Freq: Once | INTRAVENOUS | Status: AC
  Administered 2022-09-22: 10 mL

## 2022-09-22 MED ORDER — SODIUM CHLORIDE 0.9 % IV SOLN
10.0000 [IU]/m2 | Freq: Once | INTRAVENOUS | Status: AC
  Administered 2022-09-22: 23 [IU] via INTRAVENOUS
  Filled 2022-09-22: qty 7.67

## 2022-09-22 MED ORDER — SODIUM CHLORIDE 0.9 % IV SOLN
150.0000 mg | Freq: Once | INTRAVENOUS | Status: AC
  Administered 2022-09-22: 150 mg via INTRAVENOUS
  Filled 2022-09-22: qty 150

## 2022-09-22 MED ORDER — DOXORUBICIN HCL CHEMO IV INJECTION 2 MG/ML
25.0000 mg/m2 | Freq: Once | INTRAVENOUS | Status: AC
  Administered 2022-09-22: 56 mg via INTRAVENOUS
  Filled 2022-09-22: qty 28

## 2022-09-22 MED ORDER — VINBLASTINE SULFATE CHEMO INJECTION 1 MG/ML
6.0000 mg/m2 | Freq: Once | INTRAVENOUS | Status: AC
  Administered 2022-09-22: 13.6 mg via INTRAVENOUS
  Filled 2022-09-22: qty 13.6

## 2022-09-22 MED ORDER — SODIUM CHLORIDE 0.9% FLUSH
10.0000 mL | INTRAVENOUS | Status: DC | PRN
  Administered 2022-09-22: 10 mL

## 2022-09-22 MED ORDER — SODIUM CHLORIDE 0.9 % IV SOLN: Freq: Once | INTRAVENOUS | Status: AC

## 2022-09-22 NOTE — Progress Notes (Signed)
Corrected Patient seen by MD today  Vitals are within treatment parameters.  Labs reviewed: ,"and are not all within treatment parameters. Dr Irene Limbo aware [place]  0.5  Per physician team, patient is ready for treatment and there are NO modifications to the treatment plan.

## 2022-09-22 NOTE — Progress Notes (Signed)
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 male who has been referred to [place] by .Lazoff, Shawn P, DO and Dr. Wonda Amis MD for evaluation and management of newly diagnosed mediastinal mass concerning for likely lymphoma.  Patient has a history of fetal alcohol syndrome with developmental delay, narcolepsy, ADD, exercise-induced asthma and lives with his adoptive parents. He recently presented to the hospital on 07/01/2022 with 2-week history of worsening shortness of breath cough and wheezing.  He received his asthma treatment but was still noted to have significant cough.  In the emergency room he had a chest x-ray which showed an mediastinal mass. Subsequent CT chest with contrast on 07/01/2022 showed extensive mediastinal and hilar lymphadenopathy with conglomerate soft tissue mass/adenopathy within the anterior mediastinum measuring 11.2 x 7.9 x 13 cm. Compression of the left brachiocephalic and SVC within the mid though these vascular structures remain patent.  Patient did not have any clinical signs or symptoms of SVC compression syndrome or left upper extremity swelling.  Patient subsequently had a CT of the abdomen and pelvis which showed no acute intra-abdominal or intrapelvic abnormalities. He underwent a CT-guided core needle biopsy of the mediastinal mass by interventional radiology.  The official pathology results from his biopsy are not currently available at the time of this clinic visit. I did call and talk to the pathologist Dr. Donneta Romberg and she notes that this either looks like a lymphoma or thymoma and she is running additional tests to make a final  diagnosis.  Patient's father accompanied him for this visit and his mother who helps make most of the decisions in tandem with his father was present on the phone.  They do not note any unexplained fevers chills night sweats or significant weight loss.  His breathing has been relatively stable since hospital discharge but he is still having some cough.  I confirmed adequacy of tissue sampling with the pathologist and the patient was then started on prednisone to try to help his cough by drinking his mediastinal tumor some and reducing bronchial inflammation.  Patient is unable to provide much information or review of systems on account of his developmental delay issues.  Interval History:   Paul [name] is a wonderful 32 y.o. male who is here with his father for continued evaluation and management of newly diagnosed mediastinal mass concerning for likely lymphoma. He is here to start cycle 2 day 15 of his treatment.   Patient was last seen by me on 09/08/2022 and he was doing well overall except grade 1 nausea after his first cycle of her treatment which was resolved with nausea medication. He also complained of hair loss and tooth ache causing him chewing difficulty chewing hard food.   Patient reports he has been doing well overall without any new medical concerns since our last visit. He denies kin rashes, abdominal pain, back pain, chest pain, shortness of breath, bone pain, abnormal bowl moments, chills, night sweats, cough, loss of appetite, or leg swelling.   He is tolerating his treatment with new medication well overall without any toxicities.    MEDICAL HISTORY:  Past Medical History:  Diagnosis Date   ADD (attention deficit disorder)  ADHD (attention deficit hyperactivity disorder)    Asthma    excercise induced and pollen   Auditory processing disorder    Complication of anesthesia    mother states pt. is harder to put under anes. and hard to wake up due to fetal alcohol  syndrome; can be combative, per mother; states he will do better if mother is in PACU when he is coming out of anes.   Development delay    states mental status of a 32 year old   Exercise-induced asthma    prn inhaler/neb.   Fetal alcohol syndrome    Narcolepsy    Non-restorable tooth 09/2015   teeth   Separation anxiety    Twin birth     SURGICAL HISTORY: Past Surgical History:  Procedure Laterality Date   IR IMAGING GUIDED PORT INSERTION  08/11/2022   MULTIPLE EXTRACTIONS WITH ALVEOLOPLASTY N/A 09/23/2015   Procedure: MULTIPLE EXTRACTION WITH ALVEOLOPLASTY;  Surgeon: Diona Browner, DDS;  Location: [facility];  Service: Oral Surgery;  Laterality: N/A;   PYLOROMYOTOMY     age [age], performed found to not have stenosis on surgical exam   PYLOROMYOTOMY     did not have pyloric stenosis; did surgery on the wrong twin   RADIOLOGY WITH ANESTHESIA N/A 01/16/2020   Procedure: MRI WITH ANESTHESIA  BRAIN WITH AND WITHOUT CONTRAST;  Surgeon: Radiologist, Medication, MD;  Location: [facility];  Service: Radiology;  Laterality: N/A;    SOCIAL HISTORY: Social History   Socioeconomic History   Marital status: Single    Spouse name: Not on file   Number of children: Not on file   Years of education: Not on file   Highest education level: Not on file  Occupational History   Not on file  Tobacco Use   Smoking status: Never   Smokeless tobacco: Never  Vaping Use   Vaping Use: Never used  Substance and Sexual Activity   Alcohol use: No   Drug use: No   Sexual activity: Not on file  Other Topics Concern   Not on file  Social History Narrative   ** Merged History Encounter **       Social Determinants of Health   Financial Resource Strain: Not on file  Food Insecurity: Not on file  Transportation Needs: Not on file  Physical Activity: Not on file  Stress: Not on file  Social Connections: Not on file  Intimate Partner Violence: Not on file    FAMILY HISTORY: Family  History  Adopted: Yes  Problem Relation Age of Onset   Mental illness Mother    Diabetes Mother    Hypertension Mother    Schizophrenia Mother    Bipolar disorder Mother    Mental retardation Father    Sleep apnea Neg Hx     ALLERGIES:  is allergic to other.  MEDICATIONS:  Current Outpatient Medications  Medication Sig Dispense Refill   albuterol (PROVENTIL HFA;VENTOLIN HFA) 108 (90 BASE) MCG/ACT inhaler Inhale into the lungs every 6 (six) hours as needed for wheezing or shortness of breath.     albuterol (PROVENTIL) (5 MG/ML) 0.5% nebulizer solution Take 2.5 mg by nebulization every 6 (six) hours as needed for wheezing or shortness of breath.     Cholecalciferol (VITAMIN D3) 250 MCG (10000 UT) capsule Take 10,000 Units by mouth daily.     dexamethasone (DECADRON) 4 MG tablet Take 2 tablets by mouth once a day for 3 days after chemotherapy. Take with food. 30 tablet 1  lidocaine-prilocaine (EMLA) cream Apply to affected area once 30 g 3   loratadine (CLARITIN) 10 MG tablet Take 10 mg by mouth daily.     mometasone-formoterol (DULERA) 200-5 MCG/ACT AERO Inhale 2 puffs into the lungs 2 (two) times daily. 8.8 g 0   Multiple Vitamin (MULTIVITAMIN WITH MINERALS) TABS tablet Take 1 tablet by mouth daily.     ondansetron (ZOFRAN) 4 MG tablet Take 1 tablet (4 mg total) by mouth every 6 (six) hours as needed for nausea. 20 tablet 0   prochlorperazine (COMPAZINE) 10 MG tablet Take 1 tablet (10 mg total) by mouth every 6 (six) hours as needed for nausea or vomiting. 30 tablet 1   No current facility-administered medications for this visit.    REVIEW OF SYSTEMS:    10 Point review of Systems was done is negative except as noted above.  PHYSICAL EXAMINATION  BP 109/74   Pulse 89   Temp 97.9 F (36.6 C)   Resp 20   Wt 239 lb 1.6 oz (108.5 kg)   SpO2 99%   BMI 34.31 kg/m  . GENERAL:alert, in no acute distress and comfortable SKIN: no acute rashes, no significant lesions EYES:  conjunctiva are pink and non-injected, sclera anicteric OROPHARYNX: MMM, no exudates, no oropharyngeal erythema or ulceration NECK: supple, no JVD LYMPH:  no palpable lymphadenopathy in the cervical, axillary or inguinal regions LUNGS: clear to auscultation b/l with normal respiratory effort HEART: regular rate & rhythm ABDOMEN:  normoactive bowel sounds , non tender, not distended. Extremity: no pedal edema PSYCH: alert & oriented x 3 with fluent speech NEURO: no focal motor/sensory deficits   LABORATORY DATA:  I have reviewed the data as listed  .    Latest Ref Rng & Units 09/22/2022   12:17 PM 09/08/2022   11:59 AM 08/25/2022   11:25 AM  CBC  WBC 4.0 - 10.5 K/uL 4.2  5.7  2.4   Hemoglobin 13.0 - 17.0 g/dL 12.9  13.3  13.5   Hematocrit 39.0 - 52.0 % 36.3  38.0  38.3   Platelets 150 - 400 K/uL 293  316  264     .    Latest Ref Rng & Units 09/22/2022   12:17 PM 09/08/2022   11:59 AM 08/25/2022   11:25 AM  CMP  Glucose 70 - 99 mg/dL 95  103  109   BUN 6 - 20 mg/dL '9  9  14   '$ Creatinine 0.61 - 1.24 mg/dL 0.76  0.81  0.79   Sodium 135 - 145 mmol/L 139  138  137   Potassium 3.5 - 5.1 mmol/L 3.6  3.7  3.7   Chloride 98 - 111 mmol/L 104  102  102   CO2 22 - 32 mmol/L '29  30  27   '$ Calcium 8.9 - 10.3 mg/dL 9.4  9.4  9.1   Total Protein 6.5 - 8.1 g/dL 6.7  7.0  7.5   Total Bilirubin 0.3 - 1.2 mg/dL 0.2  0.2  0.5   Alkaline Phos 38 - 126 U/L 105  97  104   AST 15 - 41 U/L 25  32  26   ALT 0 - 44 U/L 28  36  35    . Lab Results  Component Value Date   LDH 171 07/03/2022   RADIOGRAPHIC STUDIES: I have personally reviewed the radiological images as listed and agreed with the findings in the report. No results found.  Surgical Pathology result from 07/03/2022: FINAL MICROSCOPIC  DIAGNOSIS:  A. MEDIASTINAL MASS, ANTERIOR, NEEDLE CORE BIOPSY: -Atypical lymphoid proliferation consistent with classical Hodgkin lymphoma -See comment  COMMENT:  The sections show needle core  biopsy fragments displaying a nodular and diffuse lymphoid proliferation associated with dense sclerosis.  The lymphoid process shows predominance of small lymphoid cells admixed to a lesser extent with histiocytes, eosinophils in addition to the large atypical mononuclear and occasionally lobated lymphoid appearing cells with variably prominent nucleoli.  Flow cytometric analysis was performed [facility] [phone]) and shows predominance of CD4-positive T cells. No monoclonal B-cell population identified.  In addition, a battery of immunohistochemical stains was performed including CD30, CD15, mum 1, LCA, CD20, PAX5, CD3, CD5, CD4, CD8, TdT, cytokeratin AE1/AE3, cytokeratin 8/18 and EBV in situ hybridization with appropriate controls.  The large atypical lymphoid appearing cells are positive for CD15, CD30, PAX5, mum 1 and rare cells for CD20.  No significant staining is seen with LCA, EBV, CD3, CD5, CD4, CD8.  Cytokeratin stains highlight a small focus of positivity likely representing native epithelial elements.  No significant TdT positivity is identified.  The small lymphocytes in the background show a mixture of T and B cells with predominance of T cells.  The latter show predominance of CD4 positive cells.  Overall, the features are atypical and most consistent with classical Hodgkin lymphoma which is best subclassified as nodular sclerosis type.   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 The mass was causing some compression of his SVC and left brachiocephalic vein but no complete obstruction or symptoms related to this. No constitutional symptoms.  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  Plan: -Discussed lab results from today, 09/22/2022, with the patient and his father. CBC shows hemoglobin of 12.9 K and hematocrit of 36.3. CMP stable. -Patient denies any toxicities with his current treatment. -Patient can proceed with cycle 2 day 15 of his treatment without any medication modifications.  -We shall plan to keep the bleomycin on for cycle 2 and cycle 3 and repeat PET CT scan prior to cycle 4 of treatment. -We will drop bleomycin from cycle 4 of treatment if he has had a good response on his PET scan. -Patient's father present during entire clinic visit and discussion. Follow-up: Per integrated scheduling  The total time spent in the appointment was 30 minutes* .  All of the patient's questions were answered with apparent satisfaction. The patient knows to call the clinic with any problems, questions or concerns.   Sullivan Lone MD MS AAHIVMS [facility] Hematology/Oncology Physician [facility]  .*Total Encounter Time as defined by the Centers for Medicare and Medicaid Services includes, in addition to the face-to-face time of a patient visit (documented in the note above) non-face-to-face time: obtaining and reviewing outside history, ordering and reviewing medications, tests or procedures, care coordination (communications with other health care professionals or caregivers) and documentation in the medical record.   I, Cleda Mccreedy, am acting as a Education administrator for Sullivan Lone, MD. .I have reviewed the above documentation for accuracy and completeness, and I agree with the above. Brunetta Genera MD

## 2022-09-22 NOTE — Patient Instructions (Signed)
[facility]  Discharge Instructions: Thank you for choosing [facility] to provide your oncology and hematology care.   If you have a lab appointment with the [facility], please go directly to the [facility] and check in at the registration area.   Wear comfortable clothing and clothing appropriate for easy access to any Portacath or PICC line.   We strive to give you quality time with your provider. You may need to reschedule your appointment if you arrive late (15 or more minutes).  Arriving late affects you and other patients whose appointments are after yours.  Also, if you miss three or more appointments without notifying the office, you may be dismissed from the clinic at the provider's discretion.      For prescription refill requests, have your pharmacy contact our office and allow 72 hours for refills to be completed.    Today you received the following chemotherapy and/or immunotherapy agents:  Doxorubicin (Adriamycin), Vinblastine (Velban), Bleomycin (Bleocin) &  Dacarbazine (DTIC)       To help prevent nausea and vomiting after your treatment, we encourage you to take your nausea medication as directed.  BELOW ARE SYMPTOMS THAT SHOULD BE REPORTED IMMEDIATELY: *FEVER GREATER THAN 100.4 F (38 C) OR HIGHER *CHILLS OR SWEATING *NAUSEA AND VOMITING THAT IS NOT CONTROLLED WITH YOUR NAUSEA MEDICATION *UNUSUAL SHORTNESS OF BREATH *UNUSUAL BRUISING OR BLEEDING *URINARY PROBLEMS (pain or burning when urinating, or frequent urination) *BOWEL PROBLEMS (unusual diarrhea, constipation, pain near the anus) TENDERNESS IN MOUTH AND THROAT WITH OR WITHOUT PRESENCE OF ULCERS (sore throat, sores in mouth, or a toothache) UNUSUAL RASH, SWELLING OR PAIN  UNUSUAL VAGINAL DISCHARGE OR ITCHING   Items with * indicate a potential emergency and should be followed up as soon as possible or go to the Emergency Department if any problems should  occur.  Please show the CHEMOTHERAPY ALERT CARD or IMMUNOTHERAPY ALERT CARD at check-in to the Emergency Department and triage nurse.  Should you have questions after your visit or need to cancel or reschedule your appointment, please contact [facility]  Dept: [phone]  and follow the prompts.  Office hours are 8:00 a.m. to 4:30 p.m. Monday - Friday. Please note that voicemails left after 4:00 p.m. may not be returned until the following business day.  We are closed weekends and major holidays. You have access to a nurse at all times for urgent questions. Please call the main number to the clinic Dept: [phone] and follow the prompts.   For any non-urgent questions, you may also contact your provider using MyChart. We now offer e-Visits for anyone 9 and older to request care online for non-urgent symptoms. For details visit [email or web address].   Also download the MyChart app! Go to the app store, search "MyChart", open the app, select [facility], and log in with your MyChart username and password.

## 2022-09-22 NOTE — Progress Notes (Signed)
Patient seen by MD today  Vitals are within treatment parameters.  Labs reviewed: and are within treatment parameters."  Per physician team, patient is ready for treatment and there are NO modifications to the treatment plan.

## 2022-09-22 NOTE — Progress Notes (Signed)
Dacarbazine Lot: [id number]  and Expiration: 04/2024 x 5 vials used to compound infusion.   Laray Anger, PharmD PGY-2 Pharmacy Resident Hematology/Oncology [phone]  09/22/2022 3:21 PM

## 2022-09-28 ENCOUNTER — Encounter: Payer: Self-pay | Admitting: Hematology

## 2022-10-05 MED FILL — Fosaprepitant Dimeglumine For IV Infusion 150 MG (Base Eq): INTRAVENOUS | Qty: 5 | Status: AC

## 2022-10-05 MED FILL — Dexamethasone Sodium Phosphate Inj 100 MG/10ML: INTRAMUSCULAR | Qty: 1 | Status: AC

## 2022-10-06 ENCOUNTER — Inpatient Hospital Stay: Payer: Medicare Other

## 2022-10-06 ENCOUNTER — Inpatient Hospital Stay (HOSPITAL_BASED_OUTPATIENT_CLINIC_OR_DEPARTMENT_OTHER): Payer: Medicare Other | Admitting: Hematology

## 2022-10-06 ENCOUNTER — Other Ambulatory Visit: Payer: Self-pay

## 2022-10-06 DIAGNOSIS — C8192 Hodgkin lymphoma, unspecified, intrathoracic lymph nodes: Secondary | ICD-10-CM

## 2022-10-06 DIAGNOSIS — Z5111 Encounter for antineoplastic chemotherapy: Secondary | ICD-10-CM | POA: Diagnosis not present

## 2022-10-06 DIAGNOSIS — C8102 Nodular lymphocyte predominant Hodgkin lymphoma, intrathoracic lymph nodes: Secondary | ICD-10-CM

## 2022-10-06 DIAGNOSIS — Z95828 Presence of other vascular implants and grafts: Secondary | ICD-10-CM

## 2022-10-06 LAB — CBC WITH DIFFERENTIAL (CANCER CENTER ONLY)
Abs Immature Granulocytes: 0.18 10*3/uL — ABNORMAL HIGH (ref 0.00–0.07)
Basophils Absolute: 0 10*3/uL (ref 0.0–0.1)
Basophils Relative: 1 %
Eosinophils Absolute: 0.1 10*3/uL (ref 0.0–0.5)
Eosinophils Relative: 3 %
HCT: 35.7 % — ABNORMAL LOW (ref 39.0–52.0)
Hemoglobin: 12.7 g/dL — ABNORMAL LOW (ref 13.0–17.0)
Immature Granulocytes: 5 %
Lymphocytes Relative: 46 %
Lymphs Abs: 1.7 10*3/uL (ref 0.7–4.0)
MCH: 30.1 pg (ref 26.0–34.0)
MCHC: 35.6 g/dL (ref 30.0–36.0)
MCV: 84.6 fL (ref 80.0–100.0)
Monocytes Absolute: 1.2 10*3/uL — ABNORMAL HIGH (ref 0.1–1.0)
Monocytes Relative: 32 %
Neutro Abs: 0.5 10*3/uL — ABNORMAL LOW (ref 1.7–7.7)
Neutrophils Relative %: 13 %
Platelet Count: 274 10*3/uL (ref 150–400)
RBC: 4.22 MIL/uL (ref 4.22–5.81)
RDW: 12.6 % (ref 11.5–15.5)
Smear Review: NORMAL
WBC Count: 3.6 10*3/uL — ABNORMAL LOW (ref 4.0–10.5)
nRBC: 0 % (ref 0.0–0.2)

## 2022-10-06 LAB — CMP (CANCER CENTER ONLY)
ALT: 26 U/L (ref 0–44)
AST: 28 U/L (ref 15–41)
Albumin: 3.9 g/dL (ref 3.5–5.0)
Alkaline Phosphatase: 98 U/L (ref 38–126)
Anion gap: 6 (ref 5–15)
BUN: 12 mg/dL (ref 6–20)
CO2: 29 mmol/L (ref 22–32)
Calcium: 9.5 mg/dL (ref 8.9–10.3)
Chloride: 103 mmol/L (ref 98–111)
Creatinine: 0.85 mg/dL (ref 0.61–1.24)
GFR, Estimated: >60 mL/min (ref >60)
Glucose, Bld: 105 mg/dL — ABNORMAL HIGH (ref 70–99)
Potassium: 4 mmol/L (ref 3.5–5.1)
Sodium: 138 mmol/L (ref 135–145)
Total Bilirubin: 0.3 mg/dL (ref 0.3–1.2)
Total Protein: 6.8 g/dL (ref 6.5–8.1)

## 2022-10-06 MED ORDER — SODIUM CHLORIDE 0.9 % IV SOLN
375.0000 mg/m2 | Freq: Once | INTRAVENOUS | Status: AC
  Administered 2022-10-06: 850 mg via INTRAVENOUS
  Filled 2022-10-06: qty 85

## 2022-10-06 MED ORDER — SODIUM CHLORIDE 0.9 % IV SOLN
10.0000 [IU]/m2 | Freq: Once | INTRAVENOUS | Status: AC
  Administered 2022-10-06: 23 [IU] via INTRAVENOUS
  Filled 2022-10-06: qty 7.67

## 2022-10-06 MED ORDER — HEPARIN SOD (PORK) LOCK FLUSH 100 UNIT/ML IV SOLN
500.0000 [IU] | Freq: Once | INTRAVENOUS | Status: AC | PRN
  Administered 2022-10-06: 500 [IU]

## 2022-10-06 MED ORDER — VINBLASTINE SULFATE CHEMO INJECTION 1 MG/ML
6.0000 mg/m2 | Freq: Once | INTRAVENOUS | Status: AC
  Administered 2022-10-06: 13.6 mg via INTRAVENOUS
  Filled 2022-10-06: qty 13.6

## 2022-10-06 MED ORDER — SODIUM CHLORIDE 0.9% FLUSH
10.0000 mL | Freq: Once | INTRAVENOUS | Status: AC
  Administered 2022-10-06: 10 mL

## 2022-10-06 MED ORDER — DOXORUBICIN HCL CHEMO IV INJECTION 2 MG/ML
25.0000 mg/m2 | Freq: Once | INTRAVENOUS | Status: AC
  Administered 2022-10-06: 56 mg via INTRAVENOUS
  Filled 2022-10-06: qty 28

## 2022-10-06 MED ORDER — SODIUM CHLORIDE 0.9 % IV SOLN
150.0000 mg | Freq: Once | INTRAVENOUS | Status: AC
  Administered 2022-10-06: 150 mg via INTRAVENOUS
  Filled 2022-10-06: qty 150

## 2022-10-06 MED ORDER — UREA 10 % EX CREA: TOPICAL_CREAM | Freq: Two times a day (BID) | CUTANEOUS | 1 refills | Status: DC

## 2022-10-06 MED ORDER — SODIUM CHLORIDE 0.9 % IV SOLN
10.0000 mg | Freq: Once | INTRAVENOUS | Status: AC
  Administered 2022-10-06: 10 mg via INTRAVENOUS
  Filled 2022-10-06: qty 10

## 2022-10-06 MED ORDER — SODIUM CHLORIDE 0.9% FLUSH
10.0000 mL | INTRAVENOUS | Status: DC | PRN
  Administered 2022-10-06: 10 mL

## 2022-10-06 MED ORDER — SODIUM CHLORIDE 0.9 % IV SOLN: Freq: Once | INTRAVENOUS | Status: AC

## 2022-10-06 MED ORDER — TRIAMCINOLONE ACETONIDE 0.025 % EX OINT: 1.0000 | TOPICAL_OINTMENT | Freq: Two times a day (BID) | CUTANEOUS | 0 refills | Status: DC

## 2022-10-06 MED ORDER — PALONOSETRON HCL INJECTION 0.25 MG/5ML
0.2500 mg | Freq: Once | INTRAVENOUS | Status: AC
  Administered 2022-10-06: 0.25 mg via INTRAVENOUS
  Filled 2022-10-06: qty 5

## 2022-10-06 NOTE — Progress Notes (Signed)
Per Dr. Irene Limbo, okay to treat with ANC 0.5

## 2022-10-06 NOTE — Progress Notes (Signed)
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 male who has been referred to [place] by .Lazoff, Shawn P, DO and Dr. Wonda Amis MD for evaluation and management of newly diagnosed mediastinal mass concerning for likely lymphoma.  Patient has a history of fetal alcohol syndrome with developmental delay, narcolepsy, ADD, exercise-induced asthma and lives with his adoptive parents. He recently presented to the hospital on 07/01/2022 with 2-week history of worsening shortness of breath cough and wheezing.  He received his asthma treatment but was still noted to have significant cough.  In the emergency room he had a chest x-ray which showed an mediastinal mass. Subsequent CT chest with contrast on 07/01/2022 showed extensive mediastinal and hilar lymphadenopathy with conglomerate soft tissue mass/adenopathy within the anterior mediastinum measuring 11.2 x 7.9 x 13 cm. Compression of the left brachiocephalic and SVC within the mid though these vascular structures remain patent.  Patient did not have any clinical signs or symptoms of SVC compression syndrome or left upper extremity swelling.  Patient subsequently had a CT of the abdomen and pelvis which showed no acute intra-abdominal or intrapelvic abnormalities. He underwent a CT-guided core needle biopsy of the mediastinal mass by interventional radiology.  The official pathology results from his biopsy are not currently available at the time of this clinic visit. I did call and talk to the pathologist Dr. Donneta Romberg and she notes that this either looks like a lymphoma or thymoma and she is running additional tests to make a final  diagnosis.  Patient's father accompanied him for this visit and his mother who helps make most of the decisions in tandem with his father was present on the phone.  They do not note any unexplained fevers chills night sweats or significant weight loss.  His breathing has been relatively stable since hospital discharge but he is still having some cough.  I confirmed adequacy of tissue sampling with the pathologist and the patient was then started on prednisone to try to help his cough by drinking his mediastinal tumor some and reducing bronchial inflammation.  Patient is unable to provide much information or review of systems on account of his developmental delay issues.  Interval History:   Paul [name] is a wonderful 32 y.o. male who is here with his father for continued evaluation and management of newly diagnosed mediastinal mass concerning for likely lymphoma. He is here to start cycle 3 day 1 of his treatment.   Patient was last seen by me on 09/22/2022 and he was doing well overall and was tolerating his treatment with new medication without any toxicities.   Patient reports he has been doing well overall without any new medical concerns since our last visit. He complains of bilateral hand skin rash with burning sensation, but denies numbness. He noticed this burning sensation around last week. Patient has been using aquaphor cream to help the burning sensation. He denies any chemical reaction to his hands.   He denies fever, chills, night sweats, unexpected weight loss, skin rashes, back pain, chest pain, or leg swelling. However, he complains of fatigue.  Patient's father reports he has not received a call to schedule CT scan.  MEDICAL HISTORY:  Past Medical History:  Diagnosis Date   ADD (attention deficit disorder)    ADHD (attention deficit hyperactivity disorder)    Asthma    excercise induced and pollen   Auditory processing disorder    Complication of anesthesia     mother states pt. is harder to put under anes. and hard to wake up due to fetal alcohol syndrome; can be combative, per mother; states he will do better if mother is in PACU when he is coming out of anes.   Development delay    states mental status of a 32 year old   Exercise-induced asthma    prn inhaler/neb.   Fetal alcohol syndrome    Narcolepsy    Non-restorable tooth 09/2015   teeth   Separation anxiety    Twin birth     SURGICAL HISTORY: Past Surgical History:  Procedure Laterality Date   IR IMAGING GUIDED PORT INSERTION  08/11/2022   MULTIPLE EXTRACTIONS WITH ALVEOLOPLASTY N/A 09/23/2015   Procedure: MULTIPLE EXTRACTION WITH ALVEOLOPLASTY;  Surgeon: Diona Browner, DDS;  Location: [facility];  Service: Oral Surgery;  Laterality: N/A;   PYLOROMYOTOMY     age [age], performed found to not have stenosis on surgical exam   PYLOROMYOTOMY     did not have pyloric stenosis; did surgery on the wrong twin   RADIOLOGY WITH ANESTHESIA N/A 01/16/2020   Procedure: MRI WITH ANESTHESIA  BRAIN WITH AND WITHOUT CONTRAST;  Surgeon: Radiologist, Medication, MD;  Location: [facility];  Service: Radiology;  Laterality: N/A;    SOCIAL HISTORY: Social History   Socioeconomic History   Marital status: Single    Spouse name: Not on file   Number of children: Not on file   Years of education: Not on file   Highest education level: Not on file  Occupational History   Not on file  Tobacco Use   Smoking status: Never   Smokeless tobacco: Never  Vaping Use   Vaping Use: Never used  Substance and Sexual Activity   Alcohol use: No   Drug use: No   Sexual activity: Not on file  Other Topics Concern   Not on file  Social History Narrative   ** Merged History Encounter **       Social Determinants of Health   Financial Resource Strain: Not on file  Food Insecurity: Not on file  Transportation Needs: Not on file  Physical Activity: Not on file  Stress: Not on file  Social  Connections: Not on file  Intimate Partner Violence: Not on file    FAMILY HISTORY: Family History  Adopted: Yes  Problem Relation Age of Onset   Mental illness Mother    Diabetes Mother    Hypertension Mother    Schizophrenia Mother    Bipolar disorder Mother    Mental retardation Father    Sleep apnea Neg Hx     ALLERGIES:  is allergic to other.  MEDICATIONS:  Current Outpatient Medications  Medication Sig Dispense Refill   albuterol (PROVENTIL HFA;VENTOLIN HFA) 108 (90 BASE) MCG/ACT inhaler Inhale into the lungs every 6 (six) hours as needed for wheezing or shortness of breath.     albuterol (PROVENTIL) (5 MG/ML) 0.5% nebulizer solution Take 2.5 mg by nebulization every 6 (six) hours as needed for wheezing or shortness of breath.     Cholecalciferol (VITAMIN D3) 250 MCG (10000 UT) capsule Take 10,000 Units by mouth daily.     dexamethasone (DECADRON) 4 MG tablet Take 2 tablets by mouth once a day for 3  days after chemotherapy. Take with food. 30 tablet 1   lidocaine-prilocaine (EMLA) cream Apply to affected area once 30 g 3   loratadine (CLARITIN) 10 MG tablet Take 10 mg by mouth daily.     mometasone-formoterol (DULERA) 200-5 MCG/ACT AERO Inhale 2 puffs into the lungs 2 (two) times daily. 8.8 g 0   Multiple Vitamin (MULTIVITAMIN WITH MINERALS) TABS tablet Take 1 tablet by mouth daily.     ondansetron (ZOFRAN) 4 MG tablet Take 1 tablet (4 mg total) by mouth every 6 (six) hours as needed for nausea. 20 tablet 0   prochlorperazine (COMPAZINE) 10 MG tablet Take 1 tablet (10 mg total) by mouth every 6 (six) hours as needed for nausea or vomiting. 30 tablet 1   No current facility-administered medications for this visit.    REVIEW OF SYSTEMS:    10 Point review of Systems was done is negative except as noted above.  PHYSICAL EXAMINATION  BP 132/81 (BP Location: Right Arm, Patient Position: Sitting)   Pulse 94   Temp 98.3 F (36.8 C) (Oral)   Resp 16   Wt 238 lb 6.4 oz  (108.1 kg)   SpO2 97%   BMI 34.21 kg/m  . GENERAL:alert, in no acute distress and comfortable SKIN: no acute rashes, no significant lesions EYES: conjunctiva are pink and non-injected, sclera anicteric OROPHARYNX: MMM, no exudates, no oropharyngeal erythema or ulceration NECK: supple, no JVD LYMPH:  no palpable lymphadenopathy in the cervical, axillary or inguinal regions LUNGS: clear to auscultation b/l with normal respiratory effort HEART: regular rate & rhythm ABDOMEN:  normoactive bowel sounds , non tender, not distended. Extremity: no pedal edema PSYCH: alert & oriented x 3 with fluent speech NEURO: no focal motor/sensory deficits   LABORATORY DATA:  I have reviewed the data as listed  .    Latest Ref Rng & Units 10/06/2022   11:27 AM 09/22/2022   12:17 PM 09/08/2022   11:59 AM  CBC  WBC 4.0 - 10.5 K/uL 3.6  4.2  5.7   Hemoglobin 13.0 - 17.0 g/dL 12.7  12.9  13.3   Hematocrit 39.0 - 52.0 % 35.7  36.3  38.0   Platelets 150 - 400 K/uL 274  293  316     .    Latest Ref Rng & Units 10/06/2022   11:27 AM 09/22/2022   12:17 PM 09/08/2022   11:59 AM  CMP  Glucose 70 - 99 mg/dL 105  95  103   BUN 6 - 20 mg/dL '12  9  9   '$ Creatinine 0.61 - 1.24 mg/dL 0.85  0.76  0.81   Sodium 135 - 145 mmol/L 138  139  138   Potassium 3.5 - 5.1 mmol/L 4.0  3.6  3.7   Chloride 98 - 111 mmol/L 103  104  102   CO2 22 - 32 mmol/L '29  29  30   '$ Calcium 8.9 - 10.3 mg/dL 9.5  9.4  9.4   Total Protein 6.5 - 8.1 g/dL 6.8  6.7  7.0   Total Bilirubin 0.3 - 1.2 mg/dL 0.3  0.2  0.2   Alkaline Phos 38 - 126 U/L 98  105  97   AST 15 - 41 U/L 28  25  32   ALT 0 - 44 U/L 26  28  36    . Lab Results  Component Value Date   LDH 171 07/03/2022   RADIOGRAPHIC STUDIES: I have personally reviewed the radiological images as listed  and agreed with the findings in the report. No results found.  Surgical Pathology result from 07/03/2022: FINAL MICROSCOPIC DIAGNOSIS:  A. MEDIASTINAL MASS, ANTERIOR, NEEDLE  CORE BIOPSY: -Atypical lymphoid proliferation consistent with classical Hodgkin lymphoma -See comment  COMMENT:  The sections show needle core biopsy fragments displaying a nodular and diffuse lymphoid proliferation associated with dense sclerosis.  The lymphoid process shows predominance of small lymphoid cells admixed to a lesser extent with histiocytes, eosinophils in addition to the large atypical mononuclear and occasionally lobated lymphoid appearing cells with variably prominent nucleoli.  Flow cytometric analysis was performed [facility] [phone]) and shows predominance of CD4-positive T cells. No monoclonal B-cell population identified.  In addition, a battery of immunohistochemical stains was performed including CD30, CD15, mum 1, LCA, CD20, PAX5, CD3, CD5, CD4, CD8, TdT, cytokeratin AE1/AE3, cytokeratin 8/18 and EBV in situ hybridization with appropriate controls.  The large atypical lymphoid appearing cells are positive for CD15, CD30, PAX5, mum 1 and rare cells for CD20.  No significant staining is seen with LCA, EBV, CD3, CD5, CD4, CD8.  Cytokeratin stains highlight a small focus of positivity likely representing native epithelial elements.  No significant TdT positivity is identified.  The small lymphocytes in the background show a mixture of T and B cells with predominance of T cells.  The latter show predominance of CD4 positive cells.  Overall, the features are atypical and most consistent with classical Hodgkin lymphoma which is best subclassified as nodular sclerosis type.   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 The mass was causing some compression of his SVC and left brachiocephalic vein but no complete obstruction or symptoms related to this. No  constitutional symptoms.  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  PLAN: -Discussed lab results from today, 10/06/2022, with the patient. CBC shows slightly decreased WBC of 3.6, hemoglobin of 12.7, and hematocrit of 35.7. CMP is stable.  -Will prescribe two cream solutions ( urea and Kenalog)  for bilateral hand skin rash with burning sensation. -Patient is tolerating his treatment well with mild toxicities including fatigue and bilateral hand skin rashes . -Patient can proceed with cycle 3 day 1 of his treatment without any medication modifications.  -We shall plan to keep the bleomycin on for cycle 3 and repeat PET CT scan prior to cycle 4 of treatment. -We will drop bleomycin from cycle 4 of treatment if he has had a good response on his PET scan. -Patient's father present during entire clinic visit and discussion.  FOLLOW-UP: Per integrated scheduling PET/CT in 3 weeks prior to C4 of treatment  The total time spent in the appointment was 30 minutes* .  All of the patient's questions were answered with apparent satisfaction. The patient knows to call the clinic with any problems, questions or concerns.   Sullivan Lone MD MS AAHIVMS [facility] Hematology/Oncology Physician [facility]  .*Total Encounter Time as defined by the Centers for Medicare and Medicaid Services includes, in addition to the face-to-face time of a patient visit (documented in the note above) non-face-to-face time: obtaining and reviewing outside history, ordering and reviewing medications, tests or procedures, care coordination (communications with other health care professionals or caregivers) and documentation in the  medical record.   Zettie Cooley, am acting as a Education administrator for Sullivan Lone, MD.

## 2022-10-06 NOTE — Patient Instructions (Signed)
[facility]  Discharge Instructions: Thank you for choosing [facility] to provide your oncology and hematology care.   If you have a lab appointment with the [facility], please go directly to the [facility] and check in at the registration area.   Wear comfortable clothing and clothing appropriate for easy access to any Portacath or PICC line.   We strive to give you quality time with your provider. You may need to reschedule your appointment if you arrive late (15 or more minutes).  Arriving late affects you and other patients whose appointments are after yours.  Also, if you miss three or more appointments without notifying the office, you may be dismissed from the clinic at the provider's discretion.      For prescription refill requests, have your pharmacy contact our office and allow 72 hours for refills to be completed.    Today you received the following chemotherapy and/or immunotherapy agents:  Doxorubicin (Adriamycin), Vinblastine (Velban), Bleomycin (Bleocin) &  Dacarbazine (DTIC)       To help prevent nausea and vomiting after your treatment, we encourage you to take your nausea medication as directed.  BELOW ARE SYMPTOMS THAT SHOULD BE REPORTED IMMEDIATELY: *FEVER GREATER THAN 100.4 F (38 C) OR HIGHER *CHILLS OR SWEATING *NAUSEA AND VOMITING THAT IS NOT CONTROLLED WITH YOUR NAUSEA MEDICATION *UNUSUAL SHORTNESS OF BREATH *UNUSUAL BRUISING OR BLEEDING *URINARY PROBLEMS (pain or burning when urinating, or frequent urination) *BOWEL PROBLEMS (unusual diarrhea, constipation, pain near the anus) TENDERNESS IN MOUTH AND THROAT WITH OR WITHOUT PRESENCE OF ULCERS (sore throat, sores in mouth, or a toothache) UNUSUAL RASH, SWELLING OR PAIN  UNUSUAL VAGINAL DISCHARGE OR ITCHING   Items with * indicate a potential emergency and should be followed up as soon as possible or go to the Emergency Department if any problems should  occur.  Please show the CHEMOTHERAPY ALERT CARD or IMMUNOTHERAPY ALERT CARD at check-in to the Emergency Department and triage nurse.  Should you have questions after your visit or need to cancel or reschedule your appointment, please contact [facility]  Dept: [phone]  and follow the prompts.  Office hours are 8:00 a.m. to 4:30 p.m. Monday - Friday. Please note that voicemails left after 4:00 p.m. may not be returned until the following business day.  We are closed weekends and major holidays. You have access to a nurse at all times for urgent questions. Please call the main number to the clinic Dept: [phone] and follow the prompts.   For any non-urgent questions, you may also contact your provider using MyChart. We now offer e-Visits for anyone 1 and older to request care online for non-urgent symptoms. For details visit [email or web address].   Also download the MyChart app! Go to the app store, search "MyChart", open the app, select [facility], and log in with your MyChart username and password.

## 2022-10-06 NOTE — Progress Notes (Signed)
Patient seen by MD today  Vitals are within treatment parameters.  Labs reviewed: and are within treatment parameters."  Per physician team, patient is ready for treatment and there are NO modifications to the treatment plan.

## 2022-10-12 ENCOUNTER — Encounter: Payer: Self-pay | Admitting: Hematology

## 2022-10-16 ENCOUNTER — Other Ambulatory Visit: Payer: Self-pay

## 2022-10-19 ENCOUNTER — Encounter (HOSPITAL_COMMUNITY)
Admission: RE | Admit: 2022-10-19 | Discharge: 2022-10-19 | Disposition: A | Discharge status: Home / Self Care | Payer: Medicare Other | Source: Ambulatory Visit | Attending: Hematology | Admitting: Hematology

## 2022-10-19 DIAGNOSIS — C8192 Hodgkin lymphoma, unspecified, intrathoracic lymph nodes: Secondary | ICD-10-CM | POA: Insufficient documentation

## 2022-10-19 LAB — GLUCOSE, CAPILLARY: Glucose-Capillary: 112 mg/dL — ABNORMAL HIGH (ref 70–99)

## 2022-10-19 MED ORDER — FLUDEOXYGLUCOSE F - 18 (FDG) INJECTION
11.9000 | Freq: Once | INTRAVENOUS | Status: AC | PRN
  Administered 2022-10-19: 11.93 via INTRAVENOUS

## 2022-10-19 MED FILL — Dexamethasone Sodium Phosphate Inj 100 MG/10ML: INTRAMUSCULAR | Qty: 1 | Status: AC

## 2022-10-19 MED FILL — Fosaprepitant Dimeglumine For IV Infusion 150 MG (Base Eq): INTRAVENOUS | Qty: 5 | Status: AC

## 2022-10-20 ENCOUNTER — Inpatient Hospital Stay: Payer: Medicare Other

## 2022-10-20 ENCOUNTER — Inpatient Hospital Stay: Payer: Medicare Other | Attending: Hematology

## 2022-10-20 ENCOUNTER — Inpatient Hospital Stay (HOSPITAL_BASED_OUTPATIENT_CLINIC_OR_DEPARTMENT_OTHER): Payer: Medicare Other | Admitting: Physician Assistant

## 2022-10-20 DIAGNOSIS — C8102 Nodular lymphocyte predominant Hodgkin lymphoma, intrathoracic lymph nodes: Secondary | ICD-10-CM

## 2022-10-20 DIAGNOSIS — Z5189 Encounter for other specified aftercare: Secondary | ICD-10-CM | POA: Insufficient documentation

## 2022-10-20 DIAGNOSIS — Z5111 Encounter for antineoplastic chemotherapy: Secondary | ICD-10-CM | POA: Insufficient documentation

## 2022-10-20 DIAGNOSIS — C8179 Other classical Hodgkin lymphoma, extranodal and solid organ sites: Secondary | ICD-10-CM | POA: Insufficient documentation

## 2022-10-20 DIAGNOSIS — C8192 Hodgkin lymphoma, unspecified, intrathoracic lymph nodes: Secondary | ICD-10-CM

## 2022-10-20 DIAGNOSIS — Z95828 Presence of other vascular implants and grafts: Secondary | ICD-10-CM

## 2022-10-20 LAB — CBC WITH DIFFERENTIAL (CANCER CENTER ONLY)
Abs Immature Granulocytes: 0.12 10*3/uL — ABNORMAL HIGH (ref 0.00–0.07)
Basophils Absolute: 0.1 10*3/uL (ref 0.0–0.1)
Basophils Relative: 2 %
Eosinophils Absolute: 0.1 10*3/uL (ref 0.0–0.5)
Eosinophils Relative: 3 %
HCT: 35.2 % — ABNORMAL LOW (ref 39.0–52.0)
Hemoglobin: 12.2 g/dL — ABNORMAL LOW (ref 13.0–17.0)
Immature Granulocytes: 4 %
Lymphocytes Relative: 45 %
Lymphs Abs: 1.5 10*3/uL (ref 0.7–4.0)
MCH: 29.3 pg (ref 26.0–34.0)
MCHC: 34.7 g/dL (ref 30.0–36.0)
MCV: 84.6 fL (ref 80.0–100.0)
Monocytes Absolute: 1.1 10*3/uL — ABNORMAL HIGH (ref 0.1–1.0)
Monocytes Relative: 33 %
Neutro Abs: 0.5 10*3/uL — ABNORMAL LOW (ref 1.7–7.7)
Neutrophils Relative %: 13 %
Platelet Count: 273 10*3/uL (ref 150–400)
RBC: 4.16 MIL/uL — ABNORMAL LOW (ref 4.22–5.81)
RDW: 13.2 % (ref 11.5–15.5)
WBC Count: 3.3 10*3/uL — ABNORMAL LOW (ref 4.0–10.5)
nRBC: 0 % (ref 0.0–0.2)

## 2022-10-20 LAB — CMP (CANCER CENTER ONLY)
ALT: 27 U/L (ref 0–44)
AST: 21 U/L (ref 15–41)
Albumin: 4 g/dL (ref 3.5–5.0)
Alkaline Phosphatase: 93 U/L (ref 38–126)
Anion gap: 6 (ref 5–15)
BUN: 12 mg/dL (ref 6–20)
CO2: 28 mmol/L (ref 22–32)
Calcium: 9.3 mg/dL (ref 8.9–10.3)
Chloride: 105 mmol/L (ref 98–111)
Creatinine: 0.78 mg/dL (ref 0.61–1.24)
GFR, Estimated: >60 mL/min (ref >60)
Glucose, Bld: 102 mg/dL — ABNORMAL HIGH (ref 70–99)
Potassium: 3.6 mmol/L (ref 3.5–5.1)
Sodium: 139 mmol/L (ref 135–145)
Total Bilirubin: 0.2 mg/dL — ABNORMAL LOW (ref 0.3–1.2)
Total Protein: 6.6 g/dL (ref 6.5–8.1)

## 2022-10-20 MED ORDER — SODIUM CHLORIDE 0.9 % IV SOLN: Freq: Once | INTRAVENOUS | Status: AC

## 2022-10-20 MED ORDER — SODIUM CHLORIDE 0.9 % IV SOLN
10.0000 mg | Freq: Once | INTRAVENOUS | Status: AC
  Administered 2022-10-20: 10 mg via INTRAVENOUS
  Filled 2022-10-20: qty 10

## 2022-10-20 MED ORDER — SODIUM CHLORIDE 0.9 % IV SOLN
375.0000 mg/m2 | Freq: Once | INTRAVENOUS | Status: AC
  Administered 2022-10-20: 800 mg via INTRAVENOUS
  Filled 2022-10-20: qty 80

## 2022-10-20 MED ORDER — DOXORUBICIN HCL CHEMO IV INJECTION 2 MG/ML
25.0000 mg/m2 | Freq: Once | INTRAVENOUS | Status: AC
  Administered 2022-10-20: 56 mg via INTRAVENOUS
  Filled 2022-10-20: qty 28

## 2022-10-20 MED ORDER — HEPARIN SOD (PORK) LOCK FLUSH 100 UNIT/ML IV SOLN
500.0000 [IU] | Freq: Once | INTRAVENOUS | Status: AC | PRN
  Administered 2022-10-20: 500 [IU]

## 2022-10-20 MED ORDER — SODIUM CHLORIDE 0.9% FLUSH
10.0000 mL | Freq: Once | INTRAVENOUS | Status: AC
  Administered 2022-10-20: 10 mL

## 2022-10-20 MED ORDER — PALONOSETRON HCL INJECTION 0.25 MG/5ML
0.2500 mg | Freq: Once | INTRAVENOUS | Status: AC
  Administered 2022-10-20: 0.25 mg via INTRAVENOUS
  Filled 2022-10-20: qty 5

## 2022-10-20 MED ORDER — SODIUM CHLORIDE 0.9 % IV SOLN
10.0000 [IU]/m2 | Freq: Once | INTRAVENOUS | Status: AC
  Administered 2022-10-20: 23 [IU] via INTRAVENOUS
  Filled 2022-10-20: qty 7.67

## 2022-10-20 MED ORDER — SODIUM CHLORIDE 0.9 % IV SOLN
150.0000 mg | Freq: Once | INTRAVENOUS | Status: AC
  Administered 2022-10-20: 150 mg via INTRAVENOUS
  Filled 2022-10-20: qty 150

## 2022-10-20 MED ORDER — VINBLASTINE SULFATE CHEMO INJECTION 1 MG/ML
6.0000 mg/m2 | Freq: Once | INTRAVENOUS | Status: AC
  Administered 2022-10-20: 13.6 mg via INTRAVENOUS
  Filled 2022-10-20: qty 13.6

## 2022-10-20 MED ORDER — ALTEPLASE 2 MG IJ SOLR
2.0000 mg | Freq: Once | INTRAMUSCULAR | Status: AC
  Administered 2022-10-20: 2 mg
  Filled 2022-10-20: qty 2

## 2022-10-20 MED ORDER — SODIUM CHLORIDE 0.9% FLUSH
10.0000 mL | INTRAVENOUS | Status: DC | PRN
  Administered 2022-10-20: 10 mL

## 2022-10-20 NOTE — Progress Notes (Signed)
Per Terisa Starr OK to proceed w/ tx today with ANC 0.5 K/uL

## 2022-10-20 NOTE — Progress Notes (Unsigned)
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 male who is here with his father for continued evaluation and management of mediastinal mass concerning for likely lymphoma. He is here to start cycle 3 day 15 of his treatment.   Paul [name] reports he is struggling with fatigue for chemotherapy but he able to complete his daily activities on his own. He does have some persistent shortness of breath mainly with exertion. His appetite and weight are stable.  He does have occasional results of nausea that improves with prescribed antiemetics.  He denies any vomiting episodes.  His bowel habits are unchanged without recurrent episodes of diarrhea or constipation.  He denies easy bruising or signs of active bleeding.  Patient denies fevers, chills, sweats, chest pain or cough. He has no other complaints.  MEDICAL HISTORY:  Past Medical History:  Diagnosis Date   ADD (attention deficit disorder)    ADHD (attention deficit hyperactivity disorder)    Asthma    excercise induced and pollen   Auditory processing disorder    Complication of anesthesia    mother states pt. is harder to put under anes. and hard to wake up due to fetal alcohol syndrome; can be combative, per mother; states he will do better if mother is in PACU when he is coming out of anes.   Development delay    states mental status of a 32 year old   Exercise-induced asthma    prn inhaler/neb.   Fetal alcohol syndrome    Narcolepsy    Non-restorable tooth 09/2015   teeth   Separation anxiety    Twin birth     SURGICAL HISTORY: Past Surgical History:  Procedure Laterality Date   IR IMAGING GUIDED PORT INSERTION  08/11/2022    MULTIPLE EXTRACTIONS WITH ALVEOLOPLASTY N/A 09/23/2015   Procedure: MULTIPLE EXTRACTION WITH ALVEOLOPLASTY;  Surgeon: Diona Browner, DDS;  Location: [facility];  Service: Oral Surgery;  Laterality: N/A;   PYLOROMYOTOMY     age [age], performed found to not have stenosis on surgical exam   PYLOROMYOTOMY     did not have pyloric stenosis; did surgery on the wrong twin   RADIOLOGY WITH ANESTHESIA N/A 01/16/2020   Procedure: MRI WITH ANESTHESIA  BRAIN WITH AND WITHOUT CONTRAST;  Surgeon: Radiologist, Medication, MD;  Location: [facility];  Service: Radiology;  Laterality: N/A;    SOCIAL HISTORY: Social History   Socioeconomic History   Marital status: Single    Spouse name: Not on file   Number of children: Not on file   Years of education: Not on file   Highest education level: Not on file  Occupational History   Not on file  Tobacco Use   Smoking status: Never   Smokeless tobacco: Never  Vaping Use   Vaping Use: Never used  Substance and Sexual Activity   Alcohol use: No   Drug use: No   Sexual activity: Not on file  Other Topics Concern   Not on file  Social History Narrative   ** Merged History Encounter **       Social Determinants of Health   Financial Resource Strain: Not on file  Food Insecurity: Not on file  Transportation Needs: Not on file  Physical Activity: Not on file  Stress: Not on file  Social Connections: Not on file  Intimate Partner Violence: Not on file    FAMILY HISTORY: Family History  Adopted: Yes  Problem Relation Age of Onset   Mental illness Mother    Diabetes Mother    Hypertension Mother    Schizophrenia Mother    Bipolar disorder Mother    Mental retardation Father    Sleep apnea Neg Hx     ALLERGIES:  is allergic to other.  MEDICATIONS:  Current Outpatient Medications  Medication Sig Dispense Refill   albuterol (PROVENTIL HFA;VENTOLIN HFA) 108 (90 BASE) MCG/ACT inhaler Inhale into the lungs every 6 (six) hours as  needed for wheezing or shortness of breath.     albuterol (PROVENTIL) (5 MG/ML) 0.5% nebulizer solution Take 2.5 mg by nebulization every 6 (six) hours as needed for wheezing or shortness of breath.     Cholecalciferol (VITAMIN D3) 250 MCG (10000 UT) capsule Take 10,000 Units by mouth daily.     dexamethasone (DECADRON) 4 MG tablet Take 2 tablets by mouth once a day for 3 days after chemotherapy. Take with food. 30 tablet 1   lidocaine-prilocaine (EMLA) cream Apply to affected area once 30 g 3   loratadine (CLARITIN) 10 MG tablet Take 10 mg by mouth daily.     mometasone-formoterol (DULERA) 200-5 MCG/ACT AERO Inhale 2 puffs into the lungs 2 (two) times daily. 8.8 g 0   Multiple Vitamin (MULTIVITAMIN WITH MINERALS) TABS tablet Take 1 tablet by mouth daily.     ondansetron (ZOFRAN) 4 MG tablet Take 1 tablet (4 mg total) by mouth every 6 (six) hours as needed for nausea. 20 tablet 0   prochlorperazine (COMPAZINE) 10 MG tablet Take 1 tablet (10 mg total) by mouth every 6 (six) hours as needed for nausea or vomiting. 30 tablet 1   triamcinolone (KENALOG) 0.025 % ointment Apply 1 Application topically 2 (two) times daily. To rash on hands 30 g 0   urea (CARMOL) 10 % cream Apply topically 2 (two) times daily. To hands and feet 453 g 1   No current facility-administered medications for this visit.    REVIEW OF SYSTEMS:    10 Point review of Systems was done is negative except as noted above.  PHYSICAL EXAMINATION  BP 121/77 (BP Location: Left Arm, Patient Position: Sitting)   Pulse 91   Temp 98.1 F (36.7 C) (Temporal)   Resp 16   Wt 239 lb 3.2 oz (108.5 kg)   SpO2 98%   BMI 34.32 kg/m   GENERAL:alert, in no acute distress and comfortable SKIN: no acute rashes, no significant lesions EYES: conjunctiva are pink and non-injected, sclera anicteric OROPHARYNX: MMM, no exudates, no oropharyngeal erythema or ulceration LUNGS: clear to auscultation b/l with normal respiratory effort HEART:  regular rate & rhythm ABDOMEN:  normoactive bowel sounds , non tender, not distended. Extremity: no pedal edema PSYCH: alert & oriented x 3 with fluent speech NEURO: no focal motor/sensory deficits   LABORATORY DATA:  I have reviewed the data as listed  .    Latest Ref Rng & Units 10/20/2022   12:50 PM 10/06/2022   11:27 AM 09/22/2022   12:17 PM  CBC  WBC 4.0 - 10.5 K/uL 3.3  3.6  4.2   Hemoglobin 13.0 - 17.0 g/dL 12.2  12.7  12.9   Hematocrit 39.0 - 52.0 % 35.2  35.7  36.3   Platelets 150 - 400 K/uL 273  274  293     .    Latest Ref Rng & Units 10/20/2022   12:50 PM 10/06/2022   11:27 AM 09/22/2022   12:17 PM  CMP  Glucose 70 - 99 mg/dL 102  105  95   BUN 6 - 20 mg/dL 12  12  9   $ Creatinine 0.61 - 1.24 mg/dL 0.78  0.85  0.76   Sodium 135 - 145 mmol/L 139  138  139   Potassium 3.5 - 5.1 mmol/L 3.6  4.0  3.6   Chloride 98 - 111 mmol/L 105  103  104   CO2 22 - 32 mmol/L 28  29  29   $ Calcium 8.9 - 10.3 mg/dL 9.3  9.5  9.4   Total Protein 6.5 - 8.1 g/dL 6.6  6.8  6.7   Total Bilirubin 0.3 - 1.2 mg/dL 0.2  0.3  0.2   Alkaline Phos 38 - 126 U/L 93  98  105   AST 15 - 41 U/L 21  28  25   $ ALT 0 - 44 U/L 27  26  28    $ . Lab Results  Component Value Date   LDH 171 07/03/2022   RADIOGRAPHIC STUDIES: I have personally reviewed the radiological images as listed and agreed with the findings in the report. NM PET Image Restag (PS) Skull Base To Thigh  Result Date: 10/19/2022 CLINICAL DATA:  Subsequent treatment strategy for Hodgkin's lymphoma post 3 cycles of chemotherapy. EXAM: NUCLEAR MEDICINE PET SKULL BASE TO THIGH TECHNIQUE: 11.93 mCi F-18 FDG was injected intravenously. Full-ring PET imaging was performed from the skull base to thigh after the radiotracer. CT data was obtained and used for attenuation correction and anatomic localization. Fasting blood glucose: 112 mg/dl COMPARISON:  PET-CT July 22, 2022 FINDINGS: Mediastinal blood pool activity: SUV max 2.8 Liver activity:  SUV max 3.6 NECK: Craniocervical misalignment artifact secondary to patient motion. Asymmetric left-sided hypermetabolic hyperplasia of the tonsils with a max SUV 6.7. No hypermetabolic cervical adenopathy. Incidental CT findings: None. CHEST: Decreased size and FDG avidity in the dominant anterior mediastinal mass now measuring 8.7 x 2.5 cm on image 63/4 with a max SUV of 3.3 previously measuring 12.8 x 7.4 cm with a max SUV of 7.7 Decreased size and FDG avidity of the additional mediastinal and hilar lymph nodes. For reference: -high left paratracheal lymph node now measures 6 mm in short axis on image 49/4 with a max SUV of 3.4 previously measuring 1.7 cm with a max SUV of 7.8 -right hilar lymph node now measures 1 cm in short axis on image 63/4 with a max SUV of 2.8 previously measuring 2.4 cm with a max SUV of 17.4. No new hypermetabolic thoracic adenopathy and no hypermetabolic pulmonary nodules/masses New hypermetabolic skin thickening over the right axilla on image 65/4 with a max SUV of 4.2 is commonly inflammatory Incidental CT findings: Right chest Port-A-Cath with tip at the superior cavoatrial junction. ABDOMEN/PELVIS: No abnormal hypermetabolic activity within the liver, pancreas, adrenal glands, or spleen. No hypermetabolic lymph nodes in the abdomen or pelvis. Incidental CT findings: No splenomegaly. Fluid signal right renal lesion is compatible with a cyst measuring 2 cm on image 106/4 and considered benign requiring no independent imaging follow-up. SKELETON: No focal hypermetabolic activity to suggest skeletal metastasis. Incidental CT findings: None. IMPRESSION: 1. Decreased size and FDG avidity of the dominant anterior mediastinal mass and additional mediastinal and hilar lymph nodes, consistent with treatment response. ([place] 3). 2. Asymmetric left-sided hypermetabolic hyperplasia  of the tonsils with a max SUV of 6.7, suggest correlation with direct visualization. 3. New hypermetabolic skin  thickening over the right axilla is commonly inflammatory. Electronically Signed   By: Dahlia Bailiff M.D.   On: 10/19/2022 08:49    Surgical Pathology result from 07/03/2022: FINAL MICROSCOPIC DIAGNOSIS:  A. MEDIASTINAL MASS, ANTERIOR, NEEDLE CORE BIOPSY: -Atypical lymphoid proliferation consistent with classical Hodgkin lymphoma -See comment  COMMENT:  The sections show needle core biopsy fragments displaying a nodular and diffuse lymphoid proliferation associated with dense sclerosis.  The lymphoid process shows predominance of small lymphoid cells admixed to a lesser extent with histiocytes, eosinophils in addition to the large atypical mononuclear and occasionally lobated lymphoid appearing cells with variably prominent nucleoli.  Flow cytometric analysis was performed [facility] [phone]) and shows predominance of CD4-positive T cells. No monoclonal B-cell population identified.  In addition, a battery of immunohistochemical stains was performed including CD30, CD15, mum 1, LCA, CD20, PAX5, CD3, CD5, CD4, CD8, TdT, cytokeratin AE1/AE3, cytokeratin 8/18 and EBV in situ hybridization with appropriate controls.  The large atypical lymphoid appearing cells are positive for CD15, CD30, PAX5, mum 1 and rare cells for CD20.  No significant staining is seen with LCA, EBV, CD3, CD5, CD4, CD8.  Cytokeratin stains highlight a small focus of positivity likely representing native epithelial elements.  No significant TdT positivity is identified.  The small lymphocytes in the background show a mixture of T and B cells with predominance of T cells.  The latter show predominance of CD4 positive cells.  Overall, the features are atypical and most consistent with classical Hodgkin lymphoma which is best subclassified as nodular sclerosis type.   ASSESSMENT & PLAN:  Paul [name] is a 32 y.o. who presents to the clinic for evaluation of Classical Hodgkins lymphoma.  #1 Newly diagnosed Stage II  bulky Classical Hodgkins lymphoma -Presented as a large mediastinal mass causing some compression of his SVC and left brachiocephalic vein but no complete obstruction or symptoms related to this.No constitutional symptoms. -Started systemic chemotherapy with ABVD on 08/12/2022.   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  PLAN: -Due for Cycle 3, Day 15 of ABVD today -Labs from today were reviewed and adequate for treatment. WBC 3.3, Hgb 12.2, Pl 273, ANC 0.5.  -Stable neutropenia and regimen has low incidence of febrile neutropenia. Neutropenic precautions given to patient -Mid treatment PET from 10/19/2022 was reviewed and shows decrease in size and FDG avidity of dominant anterior mediastinal mass and additional nodes.  -PET detected asymmetric left-sided hypermetabolic hyperplasia of the tonsils with SUV of 6.7. We will refer to ENT for direct visualization.  -Continue with treatment today without dose modifications. Dr. Roniesha Hollingshead [place] recommends to discontinue Bleomycin starting Cycle 4.   FOLLOW-UP: Per integrated scheduling  All of the patient's questions were answered with apparent satisfaction. The patient knows to call the clinic with any problems, questions or concerns.  I have spent a total of 30 minutes minutes of face-to-face and non-face-to-face time, preparing to see the patient,  performing a medically appropriate examination, counseling and educating the patient, ordering tests/procedures, referring and communicating with other health care professionals, documenting clinical information in the electronic health record, and care coordination.    Dede Query PA-C Dept of Hematology and [facility] at [facility] Phone: [phone]

## 2022-10-20 NOTE — Patient Instructions (Signed)
[facility]  Discharge Instructions: Thank you for choosing [facility] to provide your oncology and hematology care.   If you have a lab appointment with the [facility], please go directly to the [facility] and check in at the registration area.   Wear comfortable clothing and clothing appropriate for easy access to any Portacath or PICC line.   We strive to give you quality time with your provider. You may need to reschedule your appointment if you arrive late (15 or more minutes).  Arriving late affects you and other patients whose appointments are after yours.  Also, if you miss three or more appointments without notifying the office, you may be dismissed from the clinic at the provider's discretion.      For prescription refill requests, have your pharmacy contact our office and allow 72 hours for refills to be completed.    Today you received the following chemotherapy and/or immunotherapy agents: Adriamycin/Velban/Bleomycin/DTIC      To help prevent nausea and vomiting after your treatment, we encourage you to take your nausea medication as directed.  BELOW ARE SYMPTOMS THAT SHOULD BE REPORTED IMMEDIATELY: *FEVER GREATER THAN 100.4 F (38 C) OR HIGHER *CHILLS OR SWEATING *NAUSEA AND VOMITING THAT IS NOT CONTROLLED WITH YOUR NAUSEA MEDICATION *UNUSUAL SHORTNESS OF BREATH *UNUSUAL BRUISING OR BLEEDING *URINARY PROBLEMS (pain or burning when urinating, or frequent urination) *BOWEL PROBLEMS (unusual diarrhea, constipation, pain near the anus) TENDERNESS IN MOUTH AND THROAT WITH OR WITHOUT PRESENCE OF ULCERS (sore throat, sores in mouth, or a toothache) UNUSUAL RASH, SWELLING OR PAIN  UNUSUAL VAGINAL DISCHARGE OR ITCHING   Items with * indicate a potential emergency and should be followed up as soon as possible or go to the Emergency Department if any problems should occur.  Please show the CHEMOTHERAPY ALERT CARD or  IMMUNOTHERAPY ALERT CARD at check-in to the Emergency Department and triage nurse.  Should you have questions after your visit or need to cancel or reschedule your appointment, please contact [facility]  Dept: [phone]  and follow the prompts.  Office hours are 8:00 a.m. to 4:30 p.m. Monday - Friday. Please note that voicemails left after 4:00 p.m. may not be returned until the following business day.  We are closed weekends and major holidays. You have access to a nurse at all times for urgent questions. Please call the main number to the clinic Dept: [phone] and follow the prompts.   For any non-urgent questions, you may also contact your provider using MyChart. We now offer e-Visits for anyone 63 and older to request care online for non-urgent symptoms. For details visit [email or web address].   Also download the MyChart app! Go to the app store, search "MyChart", open the app, select [facility], and log in with your MyChart username and password.

## 2022-10-21 ENCOUNTER — Telehealth: Payer: Self-pay

## 2022-10-21 ENCOUNTER — Encounter: Payer: Self-pay | Admitting: Hematology

## 2022-10-21 NOTE — Telephone Encounter (Signed)
can you send referral to ENT  IT please attach PET report from [facility] for referral faxed to [facility] [phone].  Confirmation received

## 2022-11-02 MED FILL — Fosaprepitant Dimeglumine For IV Infusion 150 MG (Base Eq): INTRAVENOUS | Qty: 5 | Status: AC

## 2022-11-02 MED FILL — Dexamethasone Sodium Phosphate Inj 100 MG/10ML: INTRAMUSCULAR | Qty: 1 | Status: AC

## 2022-11-03 ENCOUNTER — Inpatient Hospital Stay: Payer: Medicare Other

## 2022-11-03 ENCOUNTER — Telehealth: Payer: Self-pay | Admitting: Hematology

## 2022-11-03 ENCOUNTER — Other Ambulatory Visit: Payer: Self-pay

## 2022-11-03 ENCOUNTER — Inpatient Hospital Stay (HOSPITAL_BASED_OUTPATIENT_CLINIC_OR_DEPARTMENT_OTHER): Payer: Medicare Other | Admitting: Hematology

## 2022-11-03 DIAGNOSIS — C8102 Nodular lymphocyte predominant Hodgkin lymphoma, intrathoracic lymph nodes: Secondary | ICD-10-CM

## 2022-11-03 DIAGNOSIS — Z95828 Presence of other vascular implants and grafts: Secondary | ICD-10-CM

## 2022-11-03 DIAGNOSIS — Z5111 Encounter for antineoplastic chemotherapy: Secondary | ICD-10-CM | POA: Diagnosis not present

## 2022-11-03 DIAGNOSIS — C8192 Hodgkin lymphoma, unspecified, intrathoracic lymph nodes: Secondary | ICD-10-CM

## 2022-11-03 LAB — CMP (CANCER CENTER ONLY)
ALT: 27 U/L (ref 0–44)
AST: 23 U/L (ref 15–41)
Albumin: 3.9 g/dL (ref 3.5–5.0)
Alkaline Phosphatase: 90 U/L (ref 38–126)
Anion gap: 6 (ref 5–15)
BUN: 8 mg/dL (ref 6–20)
CO2: 30 mmol/L (ref 22–32)
Calcium: 8.7 mg/dL — ABNORMAL LOW (ref 8.9–10.3)
Chloride: 102 mmol/L (ref 98–111)
Creatinine: 0.76 mg/dL (ref 0.61–1.24)
GFR, Estimated: >60 mL/min (ref >60)
Glucose, Bld: 85 mg/dL (ref 70–99)
Potassium: 3.6 mmol/L (ref 3.5–5.1)
Sodium: 138 mmol/L (ref 135–145)
Total Bilirubin: 0.3 mg/dL (ref 0.3–1.2)
Total Protein: 6.4 g/dL — ABNORMAL LOW (ref 6.5–8.1)

## 2022-11-03 LAB — CBC WITH DIFFERENTIAL (CANCER CENTER ONLY)
Abs Immature Granulocytes: 0.07 10*3/uL (ref 0.00–0.07)
Basophils Absolute: 0.1 10*3/uL (ref 0.0–0.1)
Basophils Relative: 3 %
Eosinophils Absolute: 0.2 10*3/uL (ref 0.0–0.5)
Eosinophils Relative: 6 %
HCT: 34.9 % — ABNORMAL LOW (ref 39.0–52.0)
Hemoglobin: 11.9 g/dL — ABNORMAL LOW (ref 13.0–17.0)
Immature Granulocytes: 3 %
Lymphocytes Relative: 45 %
Lymphs Abs: 1.3 10*3/uL (ref 0.7–4.0)
MCH: 29.1 pg (ref 26.0–34.0)
MCHC: 34.1 g/dL (ref 30.0–36.0)
MCV: 85.3 fL (ref 80.0–100.0)
Monocytes Absolute: 0.9 10*3/uL (ref 0.1–1.0)
Monocytes Relative: 32 %
Neutro Abs: 0.3 10*3/uL — CL (ref 1.7–7.7)
Neutrophils Relative %: 11 %
Platelet Count: 274 10*3/uL (ref 150–400)
RBC: 4.09 MIL/uL — ABNORMAL LOW (ref 4.22–5.81)
RDW: 13.8 % (ref 11.5–15.5)
WBC Count: 2.7 10*3/uL — ABNORMAL LOW (ref 4.0–10.5)
nRBC: 0 % (ref 0.0–0.2)

## 2022-11-03 MED ORDER — SODIUM CHLORIDE 0.9% FLUSH
10.0000 mL | Freq: Once | INTRAVENOUS | Status: AC
  Administered 2022-11-03: 10 mL

## 2022-11-03 NOTE — Telephone Encounter (Signed)
Called patient per 2/27 los notes to defer treatment by 1 week. Patient rescheduled and voicemail left with new appointment information and contact details if needing to reschedule.

## 2022-11-03 NOTE — Progress Notes (Signed)
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 male who has been referred to [place] by .Lazoff, Shawn P, DO and Dr. Wonda Amis MD for evaluation and management of newly diagnosed mediastinal mass concerning for likely lymphoma.  Patient has a history of fetal alcohol syndrome with developmental delay, narcolepsy, ADD, exercise-induced asthma and lives with his adoptive parents. He recently presented to the hospital on 07/01/2022 with 2-week history of worsening shortness of breath cough and wheezing.  He received his asthma treatment but was still noted to have significant cough.  In the emergency room he had a chest x-ray which showed an mediastinal mass. Subsequent CT chest with contrast on 07/01/2022 showed extensive mediastinal and hilar lymphadenopathy with conglomerate soft tissue mass/adenopathy within the anterior mediastinum measuring 11.2 x 7.9 x 13 cm. Compression of the left brachiocephalic and SVC within the mid though these vascular structures remain patent.  Patient did not have any clinical signs or symptoms of SVC compression syndrome or left upper extremity swelling.  Patient subsequently had a CT of the abdomen and pelvis which showed no acute intra-abdominal or intrapelvic abnormalities. He underwent a CT-guided core needle biopsy of the mediastinal mass by interventional radiology.  The official pathology results from his biopsy are not currently available at the time of this clinic visit. I did call and talk to the pathologist Dr. Donneta Romberg and she notes that this either looks like a lymphoma or thymoma and she is running additional tests to make a final  diagnosis.  Patient's father accompanied him for this visit and his mother who helps make most of the decisions in tandem with his father was present on the phone.  They do not note any unexplained fevers chills night sweats or significant weight loss.  His breathing has been relatively stable since hospital discharge but he is still having some cough.  I confirmed adequacy of tissue sampling with the pathologist and the patient was then started on prednisone to try to help his cough by drinking his mediastinal tumor some and reducing bronchial inflammation.  Patient is unable to provide much information or review of systems on account of his developmental delay issues.  Interval History:   Paul [name] is a wonderful 32 y.o. male who is here with his father for continued evaluation and management of newly diagnosed mediastinal mass concerning for likely lymphoma. He is here to start cycle 4 day 1 of his treatment. We will hold cycle 4 of his treatment during this visit till next week.   Patient was last seen by PA Thayil on 10/21/2022 and he complained of fatigue from chemotherapy, persistent shortness of breath mainly with exertion, and occasional nausea.   Patient is accompanied by his father during this visit. He reports he has been doing fairly well since our last visit. Patient complains of bilateral hand weakness and bilateral hand numbness. Patient still complains of persistent shortness of breath with exertion. He reports that he had nausea since our last visit, but is not nauseous during this visit.   Patient notes he started noticing bilateral hand weakness/numbness around 3-4 days ago. Patient notes he was having trouble holding things when he first noticed his hand weakness. He notes  he kept dropping spatula when he was cooking around 3-4 days ago.   He denies skin rashes, fever, chills, night sweats, fatigue, chest pain, back pain, abdominal pain, or leg swelling.  MEDICAL  HISTORY:  Past Medical History:  Diagnosis Date   ADD (attention deficit disorder)    ADHD (attention deficit hyperactivity disorder)    Asthma    excercise induced and pollen   Auditory processing disorder    Complication of anesthesia    mother states pt. is harder to put under anes. and hard to wake up due to fetal alcohol syndrome; can be combative, per mother; states he will do better if mother is in PACU when he is coming out of anes.   Development delay    states mental status of a 32 year old   Exercise-induced asthma    prn inhaler/neb.   Fetal alcohol syndrome    Narcolepsy    Non-restorable tooth 09/2015   teeth   Separation anxiety    Twin birth     SURGICAL HISTORY: Past Surgical History:  Procedure Laterality Date   IR IMAGING GUIDED PORT INSERTION  08/11/2022   MULTIPLE EXTRACTIONS WITH ALVEOLOPLASTY N/A 09/23/2015   Procedure: MULTIPLE EXTRACTION WITH ALVEOLOPLASTY;  Surgeon: Diona Browner, DDS;  Location: [place];  Service: Oral Surgery;  Laterality: N/A;   PYLOROMYOTOMY     age [age], performed found to not have stenosis on surgical exam   PYLOROMYOTOMY     did not have pyloric stenosis; did surgery on the wrong twin   RADIOLOGY WITH ANESTHESIA N/A 01/16/2020   Procedure: MRI WITH ANESTHESIA  BRAIN WITH AND WITHOUT CONTRAST;  Surgeon: Radiologist, Medication, MD;  Location: [place];  Service: Radiology;  Laterality: N/A;    SOCIAL HISTORY: Social History   Socioeconomic History   Marital status: Single    Spouse name: Not on file   Number of children: Not on file   Years of education: Not on file   Highest education level: Not on file  Occupational History   Not on file  Tobacco Use   Smoking status: Never   Smokeless tobacco: Never  Vaping Use   Vaping Use: Never used  Substance and Sexual Activity   Alcohol use: No   Drug use: No   Sexual activity: Not on file  Other Topics Concern   Not on file  Social History Narrative   **  Merged History Encounter **       Social Determinants of Health   Financial Resource Strain: Not on file  Food Insecurity: Not on file  Transportation Needs: Not on file  Physical Activity: Not on file  Stress: Not on file  Social Connections: Not on file  Intimate Partner Violence: Not on file    FAMILY HISTORY: Family History  Adopted: Yes  Problem Relation Age of Onset   Mental illness Mother    Diabetes Mother    Hypertension Mother    Schizophrenia Mother    Bipolar disorder Mother    Mental retardation Father    Sleep apnea Neg Hx     ALLERGIES:  is allergic to other.  MEDICATIONS:  Current Outpatient Medications  Medication Sig Dispense Refill   albuterol (PROVENTIL HFA;VENTOLIN HFA) 108 (90 BASE) MCG/ACT inhaler Inhale into the lungs every 6 (six) hours as needed for wheezing or shortness of breath.     albuterol (PROVENTIL) (5 MG/ML) 0.5% nebulizer solution Take 2.5 mg by nebulization every 6 (six) hours as needed for wheezing or  shortness of breath.     Cholecalciferol (VITAMIN D3) 250 MCG (10000 UT) capsule Take 10,000 Units by mouth daily.     dexamethasone (DECADRON) 4 MG tablet Take 2 tablets by mouth once a day for 3 days after chemotherapy. Take with food. 30 tablet 1   lidocaine-prilocaine (EMLA) cream Apply to affected area once 30 g 3   loratadine (CLARITIN) 10 MG tablet Take 10 mg by mouth daily.     mometasone-formoterol (DULERA) 200-5 MCG/ACT AERO Inhale 2 puffs into the lungs 2 (two) times daily. 8.8 g 0   Multiple Vitamin (MULTIVITAMIN WITH MINERALS) TABS tablet Take 1 tablet by mouth daily.     ondansetron (ZOFRAN) 4 MG tablet Take 1 tablet (4 mg total) by mouth every 6 (six) hours as needed for nausea. 20 tablet 0   prochlorperazine (COMPAZINE) 10 MG tablet Take 1 tablet (10 mg total) by mouth every 6 (six) hours as needed for nausea or vomiting. 30 tablet 1   triamcinolone (KENALOG) 0.025 % ointment Apply 1 Application topically 2 (two) times  daily. To rash on hands 30 g 0   urea (CARMOL) 10 % cream Apply topically 2 (two) times daily. To hands and feet 453 g 1   No current facility-administered medications for this visit.    REVIEW OF SYSTEMS:    10 Point review of Systems was done is negative except as noted above.  PHYSICAL EXAMINATION  BP 113/77 (BP Location: Right Arm, Patient Position: Sitting)   Pulse 91   Temp 97.6 F (36.4 C) (Oral)   Resp 18   Ht '5\' 10"'$  (1.778 m)   Wt 239 lb 11.2 oz (108.7 kg)   SpO2 98%   BMI 34.39 kg/m  . GENERAL:alert, in no acute distress and comfortable SKIN: no acute rashes, no significant lesions EYES: conjunctiva are pink and non-injected, sclera anicteric OROPHARYNX: MMM, no exudates, no oropharyngeal erythema or ulceration NECK: supple, no JVD LYMPH:  no palpable lymphadenopathy in the cervical, axillary or inguinal regions LUNGS: clear to auscultation b/l with normal respiratory effort HEART: regular rate & rhythm ABDOMEN:  normoactive bowel sounds , non tender, not distended. Extremity: no pedal edema PSYCH: alert & oriented x 3 with fluent speech NEURO: no focal motor/sensory deficits   LABORATORY DATA:  I have reviewed the data as listed  .    Latest Ref Rng & Units 11/03/2022   11:57 AM 10/20/2022   12:50 PM 10/06/2022   11:27 AM  CBC  WBC 4.0 - 10.5 K/uL 2.7  3.3  3.6   Hemoglobin 13.0 - 17.0 g/dL 11.9  12.2  12.7   Hematocrit 39.0 - 52.0 % 34.9  35.2  35.7   Platelets 150 - 400 K/uL 274  273  274    ANC 300  .    Latest Ref Rng & Units 11/03/2022   11:57 AM 10/20/2022   12:50 PM 10/06/2022   11:27 AM  CMP  Glucose 70 - 99 mg/dL 85  102  105   BUN 6 - 20 mg/dL '8  12  12   '$ Creatinine 0.61 - 1.24 mg/dL 0.76  0.78  0.85   Sodium 135 - 145 mmol/L 138  139  138   Potassium 3.5 - 5.1 mmol/L 3.6  3.6  4.0   Chloride 98 - 111 mmol/L 102  105  103   CO2 22 - 32 mmol/L '30  28  29   '$ Calcium 8.9 - 10.3 mg/dL 8.7  9.3  9.5  Total Protein 6.5 - 8.1 g/dL 6.4  6.6   6.8   Total Bilirubin 0.3 - 1.2 mg/dL 0.3  0.2  0.3   Alkaline Phos 38 - 126 U/L 90  93  98   AST 15 - 41 U/L '23  21  28   '$ ALT 0 - 44 U/L '27  27  26    '$ . Lab Results  Component Value Date   LDH 171 07/03/2022   RADIOGRAPHIC STUDIES: I have personally reviewed the radiological images as listed and agreed with the findings in the report. NM PET Image Restag (PS) Skull Base To Thigh  Result Date: 10/19/2022 CLINICAL DATA:  Subsequent treatment strategy for Hodgkin's lymphoma post 3 cycles of chemotherapy. EXAM: NUCLEAR MEDICINE PET SKULL BASE TO THIGH TECHNIQUE: 11.93 mCi F-18 FDG was injected intravenously. Full-ring PET imaging was performed from the skull base to thigh after the radiotracer. CT data was obtained and used for attenuation correction and anatomic localization. Fasting blood glucose: 112 mg/dl COMPARISON:  PET-CT July 22, 2022 FINDINGS: Mediastinal blood pool activity: SUV max 2.8 Liver activity: SUV max 3.6 NECK: Craniocervical misalignment artifact secondary to patient motion. Asymmetric left-sided hypermetabolic hyperplasia of the tonsils with a max SUV 6.7. No hypermetabolic cervical adenopathy. Incidental CT findings: None. CHEST: Decreased size and FDG avidity in the dominant anterior mediastinal mass now measuring 8.7 x 2.5 cm on image 63/4 with a max SUV of 3.3 previously measuring 12.8 x 7.4 cm with a max SUV of 7.7 Decreased size and FDG avidity of the additional mediastinal and hilar lymph nodes. For reference: -high left paratracheal lymph node now measures 6 mm in short axis on image 49/4 with a max SUV of 3.4 previously measuring 1.7 cm with a max SUV of 7.8 -right hilar lymph node now measures 1 cm in short axis on image 63/4 with a max SUV of 2.8 previously measuring 2.4 cm with a max SUV of 17.4. No new hypermetabolic thoracic adenopathy and no hypermetabolic pulmonary nodules/masses New hypermetabolic skin thickening over the right axilla on image 65/4 with a max SUV  of 4.2 is commonly inflammatory Incidental CT findings: Right chest Port-A-Cath with tip at the superior cavoatrial junction. ABDOMEN/PELVIS: No abnormal hypermetabolic activity within the liver, pancreas, adrenal glands, or spleen. No hypermetabolic lymph nodes in the abdomen or pelvis. Incidental CT findings: No splenomegaly. Fluid signal right renal lesion is compatible with a cyst measuring 2 cm on image 106/4 and considered benign requiring no independent imaging follow-up. SKELETON: No focal hypermetabolic activity to suggest skeletal metastasis. Incidental CT findings: None. IMPRESSION: 1. Decreased size and FDG avidity of the dominant anterior mediastinal mass and additional mediastinal and hilar lymph nodes, consistent with treatment response. (Deauville 3). 2. Asymmetric left-sided hypermetabolic hyperplasia of the tonsils with a max SUV of 6.7, suggest correlation with direct visualization. 3. New hypermetabolic skin thickening over the right axilla is commonly inflammatory. Electronically Signed   By: Dahlia Bailiff M.D.   On: 10/19/2022 08:49    Surgical Pathology result from 07/03/2022: FINAL MICROSCOPIC DIAGNOSIS:  A. MEDIASTINAL MASS, ANTERIOR, NEEDLE CORE BIOPSY: -Atypical lymphoid proliferation consistent with classical Hodgkin lymphoma -See comment  COMMENT:  The sections show needle core biopsy fragments displaying a nodular and diffuse lymphoid proliferation associated with dense sclerosis.  The lymphoid process shows predominance of small lymphoid cells admixed to a lesser extent with histiocytes, eosinophils in addition to the large atypical mononuclear and occasionally lobated lymphoid appearing cells with variably prominent nucleoli.  Flow  cytometric analysis was performed [facility] [phone]) and shows predominance of CD4-positive T cells. No monoclonal B-cell population identified.  In addition, a battery of immunohistochemical stains was performed including CD30, CD15, mum  1, LCA, CD20, PAX5, CD3, CD5, CD4, CD8, TdT, cytokeratin AE1/AE3, cytokeratin 8/18 and EBV in situ hybridization with appropriate controls.  The large atypical lymphoid appearing cells are positive for CD15, CD30, PAX5, mum 1 and rare cells for CD20.  No significant staining is seen with LCA, EBV, CD3, CD5, CD4, CD8.  Cytokeratin stains highlight a small focus of positivity likely representing native epithelial elements.  No significant TdT positivity is identified.  The small lymphocytes in the background show a mixture of T and B cells with predominance of T cells.  The latter show predominance of CD4 positive cells.  Overall, the features are atypical and most consistent with classical Hodgkin lymphoma which is best subclassified as nodular sclerosis type.   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 The mass was causing some compression of his SVC and left brachiocephalic vein but no complete obstruction or symptoms related to this. No constitutional symptoms.  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  PLAN: -Discussed lab results from today, 11/03/2022, with the patient. CBC shows decreased WBC of 2.7, ANC 300,hemoglobin of 11.9, and hematocrit of 34.9. CMP is pending. Patient is neutropenic.  -Discussed the recent PET scan results from 10/19/2022 with the patient and his father. PET scan showed  Decreased size and FDG avidity of the dominant anterior mediastinal mass and additional mediastinal and hilar lymph nodes, consistent with treatment response. (Deauville 3). Asymmetric left-sided hypermetabolic hyperplasia of the tonsils with a max SUV of 6.7, suggest correlation with  direct visualization. New hypermetabolic skin thickening over the right axilla is commonly inflammatory. -We will discontinue bleomycin from his treatment from cycle 4.   -We will hold his cycle 4 of his treatment during this visit because of grade 1 neuropathy and he is neutropenic. -Push his treatment to next week.  -Recommended to start B-complex supplement.  -Answered all of patient's father's questions regarding treatment.  -Recommended to stay hydrated, around 2L of water.  -Recommended to eat well.   FOLLOW-UP: -Move out C4D1 treatment from today out an additional week to allow for recovery of blood counts and neuropathy.   The total time spent in the appointment was 30 minutes* .  All of the patient's questions were answered with apparent satisfaction. The patient knows to call the clinic with any problems, questions or concerns.   Sullivan Lone MD MS AAHIVMS [facility] Hematology/Oncology Physician [facility]  .*Total Encounter Time as defined by the Centers for Medicare and Medicaid Services includes, in addition to the face-to-face time of a patient visit (documented in the note above) non-face-to-face time: obtaining and reviewing outside history, ordering and reviewing medications, tests or procedures, care coordination (communications with other health care professionals or caregivers) and documentation in the medical record.   I, Cleda Mccreedy, am acting as a Education administrator for Sullivan Lone, MD. .I have reviewed the above documentation for accuracy and completeness, and I agree with the above. Brunetta Genera MD

## 2022-11-03 NOTE — Progress Notes (Signed)
Patient seen by MD today  Vitals are within treatment parameters.  Labs reviewed: and are not all within treatment parameters. Dr Irene Limbo aware [place]: 0.3  Per physician team, Hold tx for today.

## 2022-11-09 ENCOUNTER — Encounter: Payer: Self-pay | Admitting: Hematology

## 2022-11-11 ENCOUNTER — Other Ambulatory Visit: Payer: Self-pay

## 2022-11-11 DIAGNOSIS — C8192 Hodgkin lymphoma, unspecified, intrathoracic lymph nodes: Secondary | ICD-10-CM

## 2022-11-11 MED FILL — Fosaprepitant Dimeglumine For IV Infusion 150 MG (Base Eq): INTRAVENOUS | Qty: 5 | Status: AC

## 2022-11-11 MED FILL — Dexamethasone Sodium Phosphate Inj 100 MG/10ML: INTRAMUSCULAR | Qty: 1 | Status: AC

## 2022-11-12 ENCOUNTER — Encounter: Payer: Self-pay | Admitting: Hematology

## 2022-11-12 ENCOUNTER — Inpatient Hospital Stay (HOSPITAL_BASED_OUTPATIENT_CLINIC_OR_DEPARTMENT_OTHER): Payer: Medicare Other | Admitting: Physician Assistant

## 2022-11-12 ENCOUNTER — Inpatient Hospital Stay: Payer: Medicare Other | Attending: Hematology

## 2022-11-12 ENCOUNTER — Other Ambulatory Visit: Payer: Self-pay

## 2022-11-12 ENCOUNTER — Inpatient Hospital Stay: Payer: Medicare Other

## 2022-11-12 VITALS — HR 100

## 2022-11-12 VITALS — BP 115/71 | HR 104 | Temp 98.1°F | Resp 14 | Wt 242.6 lb

## 2022-11-12 DIAGNOSIS — Z5111 Encounter for antineoplastic chemotherapy: Secondary | ICD-10-CM | POA: Insufficient documentation

## 2022-11-12 DIAGNOSIS — C8192 Hodgkin lymphoma, unspecified, intrathoracic lymph nodes: Secondary | ICD-10-CM

## 2022-11-12 DIAGNOSIS — D701 Agranulocytosis secondary to cancer chemotherapy: Secondary | ICD-10-CM | POA: Insufficient documentation

## 2022-11-12 DIAGNOSIS — C8179 Other classical Hodgkin lymphoma, extranodal and solid organ sites: Secondary | ICD-10-CM | POA: Insufficient documentation

## 2022-11-12 DIAGNOSIS — C8102 Nodular lymphocyte predominant Hodgkin lymphoma, intrathoracic lymph nodes: Secondary | ICD-10-CM

## 2022-11-12 DIAGNOSIS — Z95828 Presence of other vascular implants and grafts: Secondary | ICD-10-CM

## 2022-11-12 LAB — CBC WITH DIFFERENTIAL (CANCER CENTER ONLY)
Abs Immature Granulocytes: 0.14 10*3/uL — ABNORMAL HIGH (ref 0.00–0.07)
Basophils Absolute: 0.1 10*3/uL (ref 0.0–0.1)
Basophils Relative: 1 %
Eosinophils Absolute: 0.3 10*3/uL (ref 0.0–0.5)
Eosinophils Relative: 4 %
HCT: 35.4 % — ABNORMAL LOW (ref 39.0–52.0)
Hemoglobin: 12.1 g/dL — ABNORMAL LOW (ref 13.0–17.0)
Immature Granulocytes: 2 %
Lymphocytes Relative: 25 %
Lymphs Abs: 2 10*3/uL (ref 0.7–4.0)
MCH: 29.3 pg (ref 26.0–34.0)
MCHC: 34.2 g/dL (ref 30.0–36.0)
MCV: 85.7 fL (ref 80.0–100.0)
Monocytes Absolute: 0.9 10*3/uL (ref 0.1–1.0)
Monocytes Relative: 11 %
Neutro Abs: 4.7 10*3/uL (ref 1.7–7.7)
Neutrophils Relative %: 57 %
Platelet Count: 279 10*3/uL (ref 150–400)
RBC: 4.13 MIL/uL — ABNORMAL LOW (ref 4.22–5.81)
RDW: 14.5 % (ref 11.5–15.5)
WBC Count: 8.1 10*3/uL (ref 4.0–10.5)
nRBC: 0 % (ref 0.0–0.2)

## 2022-11-12 LAB — CMP (CANCER CENTER ONLY)
ALT: 24 U/L (ref 0–44)
AST: 22 U/L (ref 15–41)
Albumin: 4 g/dL (ref 3.5–5.0)
Alkaline Phosphatase: 102 U/L (ref 38–126)
Anion gap: 5 (ref 5–15)
BUN: 18 mg/dL (ref 6–20)
CO2: 29 mmol/L (ref 22–32)
Calcium: 9 mg/dL (ref 8.9–10.3)
Chloride: 105 mmol/L (ref 98–111)
Creatinine: 0.82 mg/dL (ref 0.61–1.24)
GFR, Estimated: >60 mL/min (ref >60)
Glucose, Bld: 104 mg/dL — ABNORMAL HIGH (ref 70–99)
Potassium: 3.9 mmol/L (ref 3.5–5.1)
Sodium: 139 mmol/L (ref 135–145)
Total Bilirubin: 0.2 mg/dL — ABNORMAL LOW (ref 0.3–1.2)
Total Protein: 6.6 g/dL (ref 6.5–8.1)

## 2022-11-12 MED ORDER — SODIUM CHLORIDE 0.9 % IV SOLN
375.0000 mg/m2 | Freq: Once | INTRAVENOUS | Status: AC
  Administered 2022-11-12: 800 mg via INTRAVENOUS
  Filled 2022-11-12: qty 80

## 2022-11-12 MED ORDER — PALONOSETRON HCL INJECTION 0.25 MG/5ML
0.2500 mg | Freq: Once | INTRAVENOUS | Status: AC
  Administered 2022-11-12: 0.25 mg via INTRAVENOUS
  Filled 2022-11-12: qty 5

## 2022-11-12 MED ORDER — SODIUM CHLORIDE 0.9 % IV SOLN: Freq: Once | INTRAVENOUS | Status: AC

## 2022-11-12 MED ORDER — SODIUM CHLORIDE 0.9% FLUSH
10.0000 mL | Freq: Once | INTRAVENOUS | Status: AC
  Administered 2022-11-12: 10 mL

## 2022-11-12 MED ORDER — DOXORUBICIN HCL CHEMO IV INJECTION 2 MG/ML
25.0000 mg/m2 | Freq: Once | INTRAVENOUS | Status: AC
  Administered 2022-11-12: 56 mg via INTRAVENOUS
  Filled 2022-11-12: qty 28

## 2022-11-12 MED ORDER — HEPARIN SOD (PORK) LOCK FLUSH 100 UNIT/ML IV SOLN
500.0000 [IU] | Freq: Once | INTRAVENOUS | Status: AC | PRN
  Administered 2022-11-12: 500 [IU]

## 2022-11-12 MED ORDER — SODIUM CHLORIDE 0.9% FLUSH
10.0000 mL | INTRAVENOUS | Status: DC | PRN
  Administered 2022-11-12: 10 mL

## 2022-11-12 MED ORDER — SODIUM CHLORIDE 0.9 % IV SOLN
10.0000 mg | Freq: Once | INTRAVENOUS | Status: AC
  Administered 2022-11-12: 10 mg via INTRAVENOUS
  Filled 2022-11-12: qty 10
  Filled 2022-11-12: qty 1

## 2022-11-12 MED ORDER — VINBLASTINE SULFATE CHEMO INJECTION 1 MG/ML
6.0000 mg/m2 | Freq: Once | INTRAVENOUS | Status: AC
  Administered 2022-11-12: 13.6 mg via INTRAVENOUS
  Filled 2022-11-12: qty 13.6

## 2022-11-12 MED ORDER — SODIUM CHLORIDE 0.9 % IV SOLN
150.0000 mg | Freq: Once | INTRAVENOUS | Status: AC
  Administered 2022-11-12: 150 mg via INTRAVENOUS
  Filled 2022-11-12: qty 150
  Filled 2022-11-12: qty 5

## 2022-11-12 NOTE — Progress Notes (Signed)
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 male who is here with his father for continued evaluation and management of mediastinal mass concerning for likely lymphoma. He is here to start cycle 4 day 1 of his treatment.   Paul [name] reports his energy and appetite are overall stable. He does have some fatigue can complete his daily routines on his own. He reports  the weakness and numbness in his ands have improved. He denies any changes to grip or dexterity. He denies nausea, vomiting or abdominal pain. His bowel habits are unchanged without recurrent episodes of diarrhea or constipation.  He denies easy bruising or signs of active bleeding.  Patient denies fevers, chills, sweats, chest pain or cough. He has no other complaints.  MEDICAL HISTORY:  Past Medical History:  Diagnosis Date   ADD (attention deficit disorder)    ADHD (attention deficit hyperactivity disorder)    Asthma    excercise induced and pollen   Auditory processing disorder    Complication of anesthesia    mother states pt. is harder to put under anes. and hard to wake up due to fetal alcohol syndrome; can be combative, per mother; states he will do better if mother is in PACU when he is coming out of anes.   Development delay    states mental status of a 32 year old   Exercise-induced asthma    prn inhaler/neb.   Fetal alcohol syndrome    Narcolepsy    Non-restorable tooth 09/2015   teeth   Separation anxiety    Twin birth     SURGICAL HISTORY: Past Surgical History:  Procedure Laterality Date   IR IMAGING GUIDED PORT INSERTION  08/11/2022   MULTIPLE EXTRACTIONS WITH ALVEOLOPLASTY N/A 09/23/2015   Procedure:  MULTIPLE EXTRACTION WITH ALVEOLOPLASTY;  Surgeon: Diona Browner, DDS;  Location: [facility];  Service: Oral Surgery;  Laterality: N/A;   PYLOROMYOTOMY     age [age], performed found to not have stenosis on surgical exam   PYLOROMYOTOMY     did not have pyloric stenosis; did surgery on the wrong twin   RADIOLOGY WITH ANESTHESIA N/A 01/16/2020   Procedure: MRI WITH ANESTHESIA  BRAIN WITH AND WITHOUT CONTRAST;  Surgeon: Radiologist, Medication, MD;  Location: [facility];  Service: Radiology;  Laterality: N/A;    SOCIAL HISTORY: Social History   Socioeconomic History   Marital status: Single    Spouse name: Not on file   Number of children: Not on file   Years of education: Not on file   Highest education level: Not on file  Occupational History   Not on file  Tobacco Use   Smoking status: Never   Smokeless tobacco: Never  Vaping Use   Vaping Use: Never used  Substance and Sexual Activity   Alcohol use: No   Drug use: No   Sexual activity: Not on file  Other Topics Concern   Not on file  Social History Narrative   ** Merged History Encounter **       Social Determinants of Health   Financial Resource Strain: Not on file  Food Insecurity: Not on file  Transportation Needs: Not on file  Physical Activity: Not on file  Stress: Not  on file  Social Connections: Not on file  Intimate Partner Violence: Not on file    FAMILY HISTORY: Family History  Adopted: Yes  Problem Relation Age of Onset   Mental illness Mother    Diabetes Mother    Hypertension Mother    Schizophrenia Mother    Bipolar disorder Mother    Mental retardation Father    Sleep apnea Neg Hx     ALLERGIES:  is allergic to other.  MEDICATIONS:  Current Outpatient Medications  Medication Sig Dispense Refill   albuterol (PROVENTIL HFA;VENTOLIN HFA) 108 (90 BASE) MCG/ACT inhaler Inhale into the lungs every 6 (six) hours as needed for wheezing or shortness of breath.     albuterol (PROVENTIL) (5  MG/ML) 0.5% nebulizer solution Take 2.5 mg by nebulization every 6 (six) hours as needed for wheezing or shortness of breath.     Cholecalciferol (VITAMIN D3) 250 MCG (10000 UT) capsule Take 10,000 Units by mouth daily.     dexamethasone (DECADRON) 4 MG tablet Take 2 tablets by mouth once a day for 3 days after chemotherapy. Take with food. 30 tablet 1   lidocaine-prilocaine (EMLA) cream Apply to affected area once 30 g 3   loratadine (CLARITIN) 10 MG tablet Take 10 mg by mouth daily.     mometasone-formoterol (DULERA) 200-5 MCG/ACT AERO Inhale 2 puffs into the lungs 2 (two) times daily. 8.8 g 0   Multiple Vitamin (MULTIVITAMIN WITH MINERALS) TABS tablet Take 1 tablet by mouth daily.     ondansetron (ZOFRAN) 4 MG tablet Take 1 tablet (4 mg total) by mouth every 6 (six) hours as needed for nausea. 20 tablet 0   prochlorperazine (COMPAZINE) 10 MG tablet Take 1 tablet (10 mg total) by mouth every 6 (six) hours as needed for nausea or vomiting. 30 tablet 1   triamcinolone (KENALOG) 0.025 % ointment Apply 1 Application topically 2 (two) times daily. To rash on hands 30 g 0   urea (CARMOL) 10 % cream Apply topically 2 (two) times daily. To hands and feet 453 g 1   No current facility-administered medications for this visit.   Facility-Administered Medications Ordered in Other Visits  Medication Dose Route Frequency Provider Last Rate Last Admin   dacarbazine (DTIC) 800 mg in sodium chloride 0.9 % 250 mL chemo infusion  375 mg/m2 (Order-Specific) Intravenous Once Brunetta Genera, MD       heparin lock flush 100 unit/mL  500 Units Intracatheter Once PRN Brunetta Genera, MD       sodium chloride flush (NS) 0.9 % injection 10 mL  10 mL Intracatheter PRN Brunetta Genera, MD        REVIEW OF SYSTEMS:    10 Point review of Systems was done is negative except as noted above.  PHYSICAL EXAMINATION  BP 115/71 (BP Location: Left Arm, Patient Position: Sitting)   Pulse (!) 104   Temp 98.1  F (36.7 C) (Temporal)   Resp 14   Wt 242 lb 9.6 oz (110 kg)   SpO2 99%   BMI 34.81 kg/m   GENERAL:alert, in no acute distress and comfortable SKIN: no acute rashes, no significant lesions EYES: conjunctiva are pink and non-injected, sclera anicteric OROPHARYNX: MMM, no exudates, no oropharyngeal erythema or ulceration LUNGS: clear to auscultation b/l with normal respiratory effort HEART: regular rate & rhythm ABDOMEN:  normoactive bowel sounds , non tender, not distended. Extremity: no pedal edema PSYCH: alert & oriented x 3 with fluent speech NEURO: no focal motor/sensory  deficits   LABORATORY DATA:  I have reviewed the data as listed  .    Latest Ref Rng & Units 11/12/2022    9:22 AM 11/03/2022   11:57 AM 10/20/2022   12:50 PM  CBC  WBC 4.0 - 10.5 K/uL 8.1  2.7  3.3   Hemoglobin 13.0 - 17.0 g/dL 12.1  11.9  12.2   Hematocrit 39.0 - 52.0 % 35.4  34.9  35.2   Platelets 150 - 400 K/uL 279  274  273     .    Latest Ref Rng & Units 11/12/2022    9:22 AM 11/03/2022   11:57 AM 10/20/2022   12:50 PM  CMP  Glucose 70 - 99 mg/dL 104  85  102   BUN 6 - 20 mg/dL '18  8  12   '$ Creatinine 0.61 - 1.24 mg/dL 0.82  0.76  0.78   Sodium 135 - 145 mmol/L 139  138  139   Potassium 3.5 - 5.1 mmol/L 3.9  3.6  3.6   Chloride 98 - 111 mmol/L 105  102  105   CO2 22 - 32 mmol/L '29  30  28   '$ Calcium 8.9 - 10.3 mg/dL 9.0  8.7  9.3   Total Protein 6.5 - 8.1 g/dL 6.6  6.4  6.6   Total Bilirubin 0.3 - 1.2 mg/dL 0.2  0.3  0.2   Alkaline Phos 38 - 126 U/L 102  90  93   AST 15 - 41 U/L '22  23  21   '$ ALT 0 - 44 U/L '24  27  27    '$ . Lab Results  Component Value Date   LDH 171 07/03/2022   RADIOGRAPHIC STUDIES: I have personally reviewed the radiological images as listed and agreed with the findings in the report. NM PET Image Restag (PS) Skull Base To Thigh  Result Date: 10/19/2022 CLINICAL DATA:  Subsequent treatment strategy for Hodgkin's lymphoma post 3 cycles of chemotherapy. EXAM: NUCLEAR  MEDICINE PET SKULL BASE TO THIGH TECHNIQUE: 11.93 mCi F-18 FDG was injected intravenously. Full-ring PET imaging was performed from the skull base to thigh after the radiotracer. CT data was obtained and used for attenuation correction and anatomic localization. Fasting blood glucose: 112 mg/dl COMPARISON:  PET-CT July 22, 2022 FINDINGS: Mediastinal blood pool activity: SUV max 2.8 Liver activity: SUV max 3.6 NECK: Craniocervical misalignment artifact secondary to patient motion. Asymmetric left-sided hypermetabolic hyperplasia of the tonsils with a max SUV 6.7. No hypermetabolic cervical adenopathy. Incidental CT findings: None. CHEST: Decreased size and FDG avidity in the dominant anterior mediastinal mass now measuring 8.7 x 2.5 cm on image 63/4 with a max SUV of 3.3 previously measuring 12.8 x 7.4 cm with a max SUV of 7.7 Decreased size and FDG avidity of the additional mediastinal and hilar lymph nodes. For reference: -high left paratracheal lymph node now measures 6 mm in short axis on image 49/4 with a max SUV of 3.4 previously measuring 1.7 cm with a max SUV of 7.8 -right hilar lymph node now measures 1 cm in short axis on image 63/4 with a max SUV of 2.8 previously measuring 2.4 cm with a max SUV of 17.4. No new hypermetabolic thoracic adenopathy and no hypermetabolic pulmonary nodules/masses New hypermetabolic skin thickening over the right axilla on image 65/4 with a max SUV of 4.2 is commonly inflammatory Incidental CT findings: Right chest Port-A-Cath with tip at the superior cavoatrial junction. ABDOMEN/PELVIS: No abnormal hypermetabolic activity within the  liver, pancreas, adrenal glands, or spleen. No hypermetabolic lymph nodes in the abdomen or pelvis. Incidental CT findings: No splenomegaly. Fluid signal right renal lesion is compatible with a cyst measuring 2 cm on image 106/4 and considered benign requiring no independent imaging follow-up. SKELETON: No focal hypermetabolic activity to  suggest skeletal metastasis. Incidental CT findings: None. IMPRESSION: 1. Decreased size and FDG avidity of the dominant anterior mediastinal mass and additional mediastinal and hilar lymph nodes, consistent with treatment response. ([place] 3). 2. Asymmetric left-sided hypermetabolic hyperplasia of the tonsils with a max SUV of 6.7, suggest correlation with direct visualization. 3. New hypermetabolic skin thickening over the right axilla is commonly inflammatory. Electronically Signed   By: Dahlia Bailiff M.D.   On: 10/19/2022 08:49    Surgical Pathology result from 07/03/2022: FINAL MICROSCOPIC DIAGNOSIS:  A. MEDIASTINAL MASS, ANTERIOR, NEEDLE CORE BIOPSY: -Atypical lymphoid proliferation consistent with classical Hodgkin lymphoma -See comment  COMMENT:  The sections show needle core biopsy fragments displaying a nodular and diffuse lymphoid proliferation associated with dense sclerosis.  The lymphoid process shows predominance of small lymphoid cells admixed to a lesser extent with histiocytes, eosinophils in addition to the large atypical mononuclear and occasionally lobated lymphoid appearing cells with variably prominent nucleoli.  Flow cytometric analysis was performed [facility] [phone]) and shows predominance of CD4-positive T cells. No monoclonal B-cell population identified.  In addition, a battery of immunohistochemical stains was performed including CD30, CD15, mum 1, LCA, CD20, PAX5, CD3, CD5, CD4, CD8, TdT, cytokeratin AE1/AE3, cytokeratin 8/18 and EBV in situ hybridization with appropriate controls.  The large atypical lymphoid appearing cells are positive for CD15, CD30, PAX5, mum 1 and rare cells for CD20.  No significant staining is seen with LCA, EBV, CD3, CD5, CD4, CD8.  Cytokeratin stains highlight a small focus of positivity likely representing native epithelial elements.  No significant TdT positivity is identified.  The small lymphocytes in the background show a  mixture of T and B cells with predominance of T cells.  The latter show predominance of CD4 positive cells.  Overall, the features are atypical and most consistent with classical Hodgkin lymphoma which is best subclassified as nodular sclerosis type.   ASSESSMENT & PLAN:  Paul [name] is a 32 y.o. who presents to the clinic for evaluation of Classical Hodgkins lymphoma.  #1 Newly diagnosed Stage II bulky Classical Hodgkins lymphoma -Presented as a large mediastinal mass causing some compression of his SVC and left brachiocephalic vein but no complete obstruction or symptoms related to this.No constitutional symptoms. -Started systemic chemotherapy with ABVD on 08/12/2022.  --Mid treatment PET from 10/19/2022 was reviewed and shows decrease in size and FDG avidity of dominant anterior mediastinal mass and additional nodes.  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  -We made referral to ENT for direct visualization.  PLAN: -Due for Cycle 4, Day 1 of AVD today. Holding bleomycin starting today.  -Labs from today were reviewed and adequate for treatment. WBC 8.1, Hgb 12.1, Pl 279, ANC 4.7.  -Continue with treatment today without dose modifications.  -RTC in 2 weeks for labs and follow up visit before Cycle 4, Day 15.   FOLLOW-UP: Per integrated scheduling  All of the patient's questions were answered with apparent satisfaction. The patient knows to call the  clinic with any problems, questions or concerns.  I have spent a total of 30 minutes minutes of face-to-face and non-face-to-face time, preparing to see the patient,  performing a medically appropriate examination, counseling and educating the patient, ordering tests/procedures,  referring and communicating with other health care professionals, documenting clinical information in the electronic health record, and care coordination.    Dede Query PA-C Dept of Hematology and [place] at [facility] Phone: [phone]

## 2022-11-12 NOTE — Patient Instructions (Signed)
[facility]  Discharge Instructions: Thank you for choosing [facility] to provide your oncology and hematology care.   If you have a lab appointment with the [facility], please go directly to the [facility] and check in at the registration area.   Wear comfortable clothing and clothing appropriate for easy access to any Portacath or PICC line.   We strive to give you quality time with your provider. You may need to reschedule your appointment if you arrive late (15 or more minutes).  Arriving late affects you and other patients whose appointments are after yours.  Also, if you miss three or more appointments without notifying the office, you may be dismissed from the clinic at the provider's discretion.      For prescription refill requests, have your pharmacy contact our office and allow 72 hours for refills to be completed.    Today you received the following chemotherapy and/or immunotherapy agents Adriamycin, Velban, DTIC.      To help prevent nausea and vomiting after your treatment, we encourage you to take your nausea medication as directed.  BELOW ARE SYMPTOMS THAT SHOULD BE REPORTED IMMEDIATELY: *FEVER GREATER THAN 100.4 F (38 C) OR HIGHER *CHILLS OR SWEATING *NAUSEA AND VOMITING THAT IS NOT CONTROLLED WITH YOUR NAUSEA MEDICATION *UNUSUAL SHORTNESS OF BREATH *UNUSUAL BRUISING OR BLEEDING *URINARY PROBLEMS (pain or burning when urinating, or frequent urination) *BOWEL PROBLEMS (unusual diarrhea, constipation, pain near the anus) TENDERNESS IN MOUTH AND THROAT WITH OR WITHOUT PRESENCE OF ULCERS (sore throat, sores in mouth, or a toothache) UNUSUAL RASH, SWELLING OR PAIN  UNUSUAL VAGINAL DISCHARGE OR ITCHING   Items with * indicate a potential emergency and should be followed up as soon as possible or go to the Emergency Department if any problems should occur.  Please show the CHEMOTHERAPY ALERT CARD or IMMUNOTHERAPY  ALERT CARD at check-in to the Emergency Department and triage nurse.  Should you have questions after your visit or need to cancel or reschedule your appointment, please contact [facility]  Dept: [phone]  and follow the prompts.  Office hours are 8:00 a.m. to 4:30 p.m. Monday - Friday. Please note that voicemails left after 4:00 p.m. may not be returned until the following business day.  We are closed weekends and major holidays. You have access to a nurse at all times for urgent questions. Please call the main number to the clinic Dept: [phone] and follow the prompts.   For any non-urgent questions, you may also contact your provider using MyChart. We now offer e-Visits for anyone 105 and older to request care online for non-urgent symptoms. For details visit [email or web address].   Also download the MyChart app! Go to the app store, search "MyChart", open the app, select [facility], and log in with your MyChart username and password.

## 2022-11-17 ENCOUNTER — Other Ambulatory Visit: Payer: Medicare Other

## 2022-11-17 ENCOUNTER — Ambulatory Visit: Payer: Medicare Other | Admitting: Hematology

## 2022-11-17 ENCOUNTER — Ambulatory Visit: Payer: Medicare Other

## 2022-11-18 ENCOUNTER — Encounter: Payer: Self-pay | Admitting: Hematology

## 2022-11-24 MED FILL — Dexamethasone Sodium Phosphate Inj 100 MG/10ML: INTRAMUSCULAR | Qty: 1 | Status: AC

## 2022-11-24 MED FILL — Fosaprepitant Dimeglumine For IV Infusion 150 MG (Base Eq): INTRAVENOUS | Qty: 5 | Status: AC

## 2022-11-25 ENCOUNTER — Other Ambulatory Visit: Payer: Self-pay

## 2022-11-25 ENCOUNTER — Inpatient Hospital Stay: Payer: Medicare Other

## 2022-11-25 ENCOUNTER — Other Ambulatory Visit: Payer: Self-pay | Admitting: Hematology

## 2022-11-25 ENCOUNTER — Inpatient Hospital Stay (HOSPITAL_BASED_OUTPATIENT_CLINIC_OR_DEPARTMENT_OTHER): Payer: Medicare Other | Admitting: Physician Assistant

## 2022-11-25 ENCOUNTER — Telehealth: Payer: Self-pay

## 2022-11-25 VITALS — BP 112/74 | HR 95 | Temp 97.6°F | Resp 17 | Wt 238.0 lb

## 2022-11-25 DIAGNOSIS — Z5111 Encounter for antineoplastic chemotherapy: Secondary | ICD-10-CM | POA: Diagnosis not present

## 2022-11-25 DIAGNOSIS — C8192 Hodgkin lymphoma, unspecified, intrathoracic lymph nodes: Secondary | ICD-10-CM | POA: Diagnosis not present

## 2022-11-25 DIAGNOSIS — D701 Agranulocytosis secondary to cancer chemotherapy: Secondary | ICD-10-CM

## 2022-11-25 DIAGNOSIS — T451X5A Adverse effect of antineoplastic and immunosuppressive drugs, initial encounter: Secondary | ICD-10-CM

## 2022-11-25 DIAGNOSIS — Z95828 Presence of other vascular implants and grafts: Secondary | ICD-10-CM

## 2022-11-25 DIAGNOSIS — C8102 Nodular lymphocyte predominant Hodgkin lymphoma, intrathoracic lymph nodes: Secondary | ICD-10-CM

## 2022-11-25 LAB — CBC WITH DIFFERENTIAL (CANCER CENTER ONLY)
Abs Immature Granulocytes: 0.01 10*3/uL (ref 0.00–0.07)
Basophils Absolute: 0 10*3/uL (ref 0.0–0.1)
Basophils Relative: 1 %
Eosinophils Absolute: 0.3 10*3/uL (ref 0.0–0.5)
Eosinophils Relative: 13 %
HCT: 36.6 % — ABNORMAL LOW (ref 39.0–52.0)
Hemoglobin: 12.1 g/dL — ABNORMAL LOW (ref 13.0–17.0)
Immature Granulocytes: 1 %
Lymphocytes Relative: 53 %
Lymphs Abs: 1.1 10*3/uL (ref 0.7–4.0)
MCH: 28.3 pg (ref 26.0–34.0)
MCHC: 33.1 g/dL (ref 30.0–36.0)
MCV: 85.5 fL (ref 80.0–100.0)
Monocytes Absolute: 0.6 10*3/uL (ref 0.1–1.0)
Monocytes Relative: 27 %
Neutro Abs: 0.1 10*3/uL — CL (ref 1.7–7.7)
Neutrophils Relative %: 5 %
Platelet Count: 231 10*3/uL (ref 150–400)
RBC: 4.28 MIL/uL (ref 4.22–5.81)
RDW: 15.3 % (ref 11.5–15.5)
WBC Count: 2.2 10*3/uL — ABNORMAL LOW (ref 4.0–10.5)
nRBC: 0 % (ref 0.0–0.2)

## 2022-11-25 LAB — CMP (CANCER CENTER ONLY)
ALT: 29 U/L (ref 0–44)
AST: 23 U/L (ref 15–41)
Albumin: 4.1 g/dL (ref 3.5–5.0)
Alkaline Phosphatase: 95 U/L (ref 38–126)
Anion gap: 6 (ref 5–15)
BUN: 10 mg/dL (ref 6–20)
CO2: 30 mmol/L (ref 22–32)
Calcium: 9.2 mg/dL (ref 8.9–10.3)
Chloride: 102 mmol/L (ref 98–111)
Creatinine: 0.8 mg/dL (ref 0.61–1.24)
GFR, Estimated: >60 mL/min (ref >60)
Glucose, Bld: 127 mg/dL — ABNORMAL HIGH (ref 70–99)
Potassium: 3.6 mmol/L (ref 3.5–5.1)
Sodium: 138 mmol/L (ref 135–145)
Total Bilirubin: 0.4 mg/dL (ref 0.3–1.2)
Total Protein: 6.6 g/dL (ref 6.5–8.1)

## 2022-11-25 MED ORDER — SODIUM CHLORIDE 0.9% FLUSH
10.0000 mL | Freq: Once | INTRAVENOUS | Status: AC
  Administered 2022-11-25: 10 mL

## 2022-11-25 NOTE — Telephone Encounter (Signed)
CRITICAL VALUE STICKER  CRITICAL VALUE: ANC 0.1  RECEIVER (on-site recipient of call): Maurine Simmering CMA  DATE & TIME NOTIFIED: 11/25/2022 1325  MESSENGER (representative from lab): Janett Billow in Lab  MD NOTIFIED: Dr. Irene Limbo  TIME OF NOTIFICATION: [id number]  RESPONSE: Made nurse aware

## 2022-11-26 ENCOUNTER — Encounter: Payer: Self-pay | Admitting: Hematology

## 2022-11-26 NOTE — Progress Notes (Signed)
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 male who is here with his father for continued evaluation and management of mediastinal mass concerning for likely lymphoma. He is here to start cycle 4 day 15 of his treatment.   Mr. Ertman reports he is tolerating treatment without any new or concerning symptoms. His energy is overall stable and he does experience fatigue after treatment. He reports the weakness in his hands have improved.He denies nausea, vomiting or abdominal pain. His bowel habits are unchanged without recurrent episodes of diarrhea or constipation.  He denies easy bruising or signs of active bleeding.  Patient denies fevers, chills, sweats, chest pain or cough.He has no other complaints.  MEDICAL HISTORY:  Past Medical History:  Diagnosis Date   ADD (attention deficit disorder)    ADHD (attention deficit hyperactivity disorder)    Asthma    excercise induced and pollen   Auditory processing disorder    Complication of anesthesia    mother states pt. is harder to put under anes. and hard to wake up due to fetal alcohol syndrome; can be combative, per mother; states he will do better if mother is in PACU when he is coming out of anes.   Development delay    states mental status of a 32 year old   Exercise-induced asthma    prn inhaler/neb.   Fetal alcohol syndrome    Narcolepsy    Non-restorable tooth 09/2015   teeth   Separation anxiety    Twin birth     SURGICAL HISTORY: Past Surgical History:  Procedure Laterality Date   IR IMAGING GUIDED PORT INSERTION  08/11/2022   MULTIPLE EXTRACTIONS WITH ALVEOLOPLASTY N/A 09/23/2015   Procedure: MULTIPLE EXTRACTION WITH  ALVEOLOPLASTY;  Surgeon: Diona Browner, DDS;  Location: [facility];  Service: Oral Surgery;  Laterality: N/A;   PYLOROMYOTOMY     age [age], performed found to not have stenosis on surgical exam   PYLOROMYOTOMY     did not have pyloric stenosis; did surgery on the wrong twin   RADIOLOGY WITH ANESTHESIA N/A 01/16/2020   Procedure: MRI WITH ANESTHESIA  BRAIN WITH AND WITHOUT CONTRAST;  Surgeon: Radiologist, Medication, MD;  Location: [facility];  Service: Radiology;  Laterality: N/A;    SOCIAL HISTORY: Social History   Socioeconomic History   Marital status: Single    Spouse name: Not on file   Number of children: Not on file   Years of education: Not on file   Highest education level: Not on file  Occupational History   Not on file  Tobacco Use   Smoking status: Never   Smokeless tobacco: Never  Vaping Use   Vaping Use: Never used  Substance and Sexual Activity   Alcohol use: No   Drug use: No   Sexual activity: Not on file  Other Topics Concern   Not on file  Social History Narrative   ** Merged History Encounter **       Social Determinants of Health   Financial Resource Strain: Not on file  Food Insecurity: Not on file  Transportation Needs: Not on file  Physical Activity: Not on file  Stress: Not on file  Social Connections: Not on file  Intimate Partner  Violence: Not on file    FAMILY HISTORY: Family History  Adopted: Yes  Problem Relation Age of Onset   Mental illness Mother    Diabetes Mother    Hypertension Mother    Schizophrenia Mother    Bipolar disorder Mother    Mental retardation Father    Sleep apnea Neg Hx     ALLERGIES:  is allergic to other.  MEDICATIONS:  Current Outpatient Medications  Medication Sig Dispense Refill   albuterol (PROVENTIL HFA;VENTOLIN HFA) 108 (90 BASE) MCG/ACT inhaler Inhale into the lungs every 6 (six) hours as needed for wheezing or shortness of breath.     albuterol (PROVENTIL) (5 MG/ML) 0.5% nebulizer  solution Take 2.5 mg by nebulization every 6 (six) hours as needed for wheezing or shortness of breath.     Cholecalciferol (VITAMIN D3) 250 MCG (10000 UT) capsule Take 10,000 Units by mouth daily.     dexamethasone (DECADRON) 4 MG tablet Take 2 tablets by mouth once a day for 3 days after chemotherapy. Take with food. 30 tablet 1   lidocaine-prilocaine (EMLA) cream Apply to affected area once 30 g 3   loratadine (CLARITIN) 10 MG tablet Take 10 mg by mouth daily.     mometasone-formoterol (DULERA) 200-5 MCG/ACT AERO Inhale 2 puffs into the lungs 2 (two) times daily. 8.8 g 0   Multiple Vitamin (MULTIVITAMIN WITH MINERALS) TABS tablet Take 1 tablet by mouth daily.     ondansetron (ZOFRAN) 4 MG tablet Take 1 tablet (4 mg total) by mouth every 6 (six) hours as needed for nausea. 20 tablet 0   prochlorperazine (COMPAZINE) 10 MG tablet Take 1 tablet (10 mg total) by mouth every 6 (six) hours as needed for nausea or vomiting. 30 tablet 1   triamcinolone (KENALOG) 0.025 % ointment Apply 1 Application topically 2 (two) times daily. To rash on hands 30 g 0   urea (CARMOL) 10 % cream Apply topically 2 (two) times daily. To hands and feet 453 g 1   No current facility-administered medications for this visit.    REVIEW OF SYSTEMS:    10 Point review of Systems was done is negative except as noted above.  PHYSICAL EXAMINATION  BP 112/74 (BP Location: Right Arm, Patient Position: Sitting)   Pulse 95   Temp 97.6 F (36.4 C) (Oral)   Resp 17   Wt 238 lb (108 kg)   SpO2 99%   BMI 34.15 kg/m   GENERAL:alert, in no acute distress and comfortable SKIN: no acute rashes, no significant lesions EYES: conjunctiva are pink and non-injected, sclera anicteric OROPHARYNX: MMM, no exudates, no oropharyngeal erythema or ulceration LUNGS: clear to auscultation b/l with normal respiratory effort HEART: regular rate & rhythm ABDOMEN:  normoactive bowel sounds , non tender, not distended. Extremity: no pedal  edema PSYCH: alert & oriented x 3 with fluent speech NEURO: no focal motor/sensory deficits   LABORATORY DATA:  I have reviewed the data as listed  .    Latest Ref Rng & Units 11/25/2022   12:23 PM 11/12/2022    9:22 AM 11/03/2022   11:57 AM  CBC  WBC 4.0 - 10.5 K/uL 2.2  8.1  2.7   Hemoglobin 13.0 - 17.0 g/dL 12.1  12.1  11.9   Hematocrit 39.0 - 52.0 % 36.6  35.4  34.9   Platelets 150 - 400 K/uL 231  279  274     .    Latest Ref Rng & Units 11/25/2022   12:23 PM  11/12/2022    9:22 AM 11/03/2022   11:57 AM  CMP  Glucose 70 - 99 mg/dL 127  104  85   BUN 6 - 20 mg/dL 10  18  8    Creatinine 0.61 - 1.24 mg/dL 0.80  0.82  0.76   Sodium 135 - 145 mmol/L 138  139  138   Potassium 3.5 - 5.1 mmol/L 3.6  3.9  3.6   Chloride 98 - 111 mmol/L 102  105  102   CO2 22 - 32 mmol/L 30  29  30    Calcium 8.9 - 10.3 mg/dL 9.2  9.0  8.7   Total Protein 6.5 - 8.1 g/dL 6.6  6.6  6.4   Total Bilirubin 0.3 - 1.2 mg/dL 0.4  0.2  0.3   Alkaline Phos 38 - 126 U/L 95  102  90   AST 15 - 41 U/L 23  22  23    ALT 0 - 44 U/L 29  24  27     . Lab Results  Component Value Date   LDH 171 07/03/2022   RADIOGRAPHIC STUDIES: I have personally reviewed the radiological images as listed and agreed with the findings in the report. No results found.  Surgical Pathology result from 07/03/2022: FINAL MICROSCOPIC DIAGNOSIS:  A. MEDIASTINAL MASS, ANTERIOR, NEEDLE CORE BIOPSY: -Atypical lymphoid proliferation consistent with classical Hodgkin lymphoma -See comment  COMMENT:  The sections show needle core biopsy fragments displaying a nodular and diffuse lymphoid proliferation associated with dense sclerosis.  The lymphoid process shows predominance of small lymphoid cells admixed to a lesser extent with histiocytes, eosinophils in addition to the large atypical mononuclear and occasionally lobated lymphoid appearing cells with variably prominent nucleoli.  Flow cytometric analysis was performed [facility]  [phone]) and shows predominance of CD4-positive T cells. No monoclonal B-cell population identified.  In addition, a battery of immunohistochemical stains was performed including CD30, CD15, mum 1, LCA, CD20, PAX5, CD3, CD5, CD4, CD8, TdT, cytokeratin AE1/AE3, cytokeratin 8/18 and EBV in situ hybridization with appropriate controls.  The large atypical lymphoid appearing cells are positive for CD15, CD30, PAX5, mum 1 and rare cells for CD20.  No significant staining is seen with LCA, EBV, CD3, CD5, CD4, CD8.  Cytokeratin stains highlight a small focus of positivity likely representing native epithelial elements.  No significant TdT positivity is identified.  The small lymphocytes in the background show a mixture of T and B cells with predominance of T cells.  The latter show predominance of CD4 positive cells.  Overall, the features are atypical and most consistent with classical Hodgkin lymphoma which is best subclassified as nodular sclerosis type.   ASSESSMENT & PLAN:  Paul [name] is a 32 y.o. who presents to the clinic for evaluation of Classical Hodgkins lymphoma.  #1 Newly diagnosed Stage II bulky Classical Hodgkins lymphoma -Presented as a large mediastinal mass causing some compression of his SVC and left brachiocephalic vein but no complete obstruction or symptoms related to this.No constitutional symptoms. -Started systemic chemotherapy with ABVD on 08/12/2022.  --Mid treatment PET from 10/19/2022 was reviewed and shows decrease in size and FDG avidity of dominant anterior mediastinal mass and additional nodes.  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  -We made referral to ENT for direct visualization.  PLAN: -Due for  Cycle 4, Day 15 of AVD today. Holding bleomycin  since Cycle 4, Day 1.  -Labs from today were reviewed and adequate for treatment. WBC 2.2, Hgb 12.1, Pl 231, ANC 0.1 -Discussed neutropenia with Dr. Corley Kohls [place]. We will delay treatment by one week and add granix 300 mcg x 3 days after each treatment moving forward.  -Neutropenic precautions given to patient.  -RTC in 1 week for labs and follow up visit before Cycle 4, Day 15.   FOLLOW-UP: Per integrated scheduling  All of the patient's questions were answered with apparent satisfaction. The patient knows to call the clinic with any problems, questions or concerns.  I have spent a total of 30 minutes minutes of face-to-face and non-face-to-face time, preparing to see the patient,  performing a medically appropriate examination, counseling and educating the patient, ordering tests/procedures, referring and communicating with other health care professionals, documenting clinical information in the electronic health record, and care coordination.    Dede Query PA-C Dept of Hematology and [place] at [facility] Phone: [phone]

## 2022-12-01 ENCOUNTER — Other Ambulatory Visit: Payer: Medicare Other

## 2022-12-01 ENCOUNTER — Ambulatory Visit: Payer: Medicare Other

## 2022-12-01 ENCOUNTER — Ambulatory Visit: Payer: Medicare Other | Admitting: Physician Assistant

## 2022-12-01 MED FILL — Fosaprepitant Dimeglumine For IV Infusion 150 MG (Base Eq): INTRAVENOUS | Qty: 5 | Status: AC

## 2022-12-01 MED FILL — Dexamethasone Sodium Phosphate Inj 100 MG/10ML: INTRAMUSCULAR | Qty: 1 | Status: AC

## 2022-12-02 ENCOUNTER — Other Ambulatory Visit: Payer: Self-pay

## 2022-12-02 ENCOUNTER — Inpatient Hospital Stay: Payer: Medicare Other

## 2022-12-02 ENCOUNTER — Telehealth: Payer: Self-pay | Admitting: Hematology and Oncology

## 2022-12-02 ENCOUNTER — Inpatient Hospital Stay (HOSPITAL_BASED_OUTPATIENT_CLINIC_OR_DEPARTMENT_OTHER): Payer: Medicare Other | Admitting: Physician Assistant

## 2022-12-02 VITALS — BP 104/69 | HR 98 | Resp 18

## 2022-12-02 DIAGNOSIS — C8192 Hodgkin lymphoma, unspecified, intrathoracic lymph nodes: Secondary | ICD-10-CM

## 2022-12-02 DIAGNOSIS — C8102 Nodular lymphocyte predominant Hodgkin lymphoma, intrathoracic lymph nodes: Secondary | ICD-10-CM

## 2022-12-02 DIAGNOSIS — Z5111 Encounter for antineoplastic chemotherapy: Secondary | ICD-10-CM | POA: Diagnosis not present

## 2022-12-02 DIAGNOSIS — Z95828 Presence of other vascular implants and grafts: Secondary | ICD-10-CM

## 2022-12-02 LAB — CMP (CANCER CENTER ONLY)
ALT: 28 U/L (ref 0–44)
AST: 22 U/L (ref 15–41)
Albumin: 4.2 g/dL (ref 3.5–5.0)
Alkaline Phosphatase: 95 U/L (ref 38–126)
Anion gap: 4 — ABNORMAL LOW (ref 5–15)
BUN: 14 mg/dL (ref 6–20)
CO2: 30 mmol/L (ref 22–32)
Calcium: 9.3 mg/dL (ref 8.9–10.3)
Chloride: 104 mmol/L (ref 98–111)
Creatinine: 0.87 mg/dL (ref 0.61–1.24)
GFR, Estimated: >60 mL/min (ref >60)
Glucose, Bld: 99 mg/dL (ref 70–99)
Potassium: 3.8 mmol/L (ref 3.5–5.1)
Sodium: 138 mmol/L (ref 135–145)
Total Bilirubin: 0.2 mg/dL — ABNORMAL LOW (ref 0.3–1.2)
Total Protein: 6.9 g/dL (ref 6.5–8.1)

## 2022-12-02 LAB — CBC WITH DIFFERENTIAL (CANCER CENTER ONLY)
Abs Immature Granulocytes: 0.13 10*3/uL — ABNORMAL HIGH (ref 0.00–0.07)
Basophils Absolute: 0.1 10*3/uL (ref 0.0–0.1)
Basophils Relative: 1 %
Eosinophils Absolute: 0.2 10*3/uL (ref 0.0–0.5)
Eosinophils Relative: 3 %
HCT: 37.3 % — ABNORMAL LOW (ref 39.0–52.0)
Hemoglobin: 12.7 g/dL — ABNORMAL LOW (ref 13.0–17.0)
Immature Granulocytes: 2 %
Lymphocytes Relative: 25 %
Lymphs Abs: 1.6 10*3/uL (ref 0.7–4.0)
MCH: 29 pg (ref 26.0–34.0)
MCHC: 34 g/dL (ref 30.0–36.0)
MCV: 85.2 fL (ref 80.0–100.0)
Monocytes Absolute: 1 10*3/uL (ref 0.1–1.0)
Monocytes Relative: 15 %
Neutro Abs: 3.5 10*3/uL (ref 1.7–7.7)
Neutrophils Relative %: 54 %
Platelet Count: 256 10*3/uL (ref 150–400)
RBC: 4.38 MIL/uL (ref 4.22–5.81)
RDW: 15.1 % (ref 11.5–15.5)
WBC Count: 6.5 10*3/uL (ref 4.0–10.5)
nRBC: 0 % (ref 0.0–0.2)

## 2022-12-02 MED ORDER — DOXORUBICIN HCL CHEMO IV INJECTION 2 MG/ML
25.0000 mg/m2 | Freq: Once | INTRAVENOUS | Status: AC
  Administered 2022-12-02: 56 mg via INTRAVENOUS
  Filled 2022-12-02: qty 28

## 2022-12-02 MED ORDER — HEPARIN SOD (PORK) LOCK FLUSH 100 UNIT/ML IV SOLN
500.0000 [IU] | Freq: Once | INTRAVENOUS | Status: AC | PRN
  Administered 2022-12-02: 500 [IU]

## 2022-12-02 MED ORDER — SODIUM CHLORIDE 0.9% FLUSH
10.0000 mL | INTRAVENOUS | Status: DC | PRN
  Administered 2022-12-02: 10 mL

## 2022-12-02 MED ORDER — SODIUM CHLORIDE 0.9 % IV SOLN
150.0000 mg | Freq: Once | INTRAVENOUS | Status: AC
  Administered 2022-12-02: 150 mg via INTRAVENOUS
  Filled 2022-12-02: qty 5
  Filled 2022-12-02: qty 150

## 2022-12-02 MED ORDER — SODIUM CHLORIDE 0.9 % IV SOLN
10.0000 mg | Freq: Once | INTRAVENOUS | Status: AC
  Administered 2022-12-02: 10 mg via INTRAVENOUS
  Filled 2022-12-02: qty 10
  Filled 2022-12-02: qty 1

## 2022-12-02 MED ORDER — PALONOSETRON HCL INJECTION 0.25 MG/5ML
0.2500 mg | Freq: Once | INTRAVENOUS | Status: AC
  Administered 2022-12-02: 0.25 mg via INTRAVENOUS
  Filled 2022-12-02: qty 5

## 2022-12-02 MED ORDER — SODIUM CHLORIDE 0.9 % IV SOLN
375.0000 mg/m2 | Freq: Once | INTRAVENOUS | Status: AC
  Administered 2022-12-02: 800 mg via INTRAVENOUS
  Filled 2022-12-02: qty 80

## 2022-12-02 MED ORDER — SODIUM CHLORIDE 0.9 % IV SOLN: Freq: Once | INTRAVENOUS | Status: AC

## 2022-12-02 MED ORDER — VINBLASTINE SULFATE CHEMO INJECTION 1 MG/ML
6.0000 mg/m2 | Freq: Once | INTRAVENOUS | Status: AC
  Administered 2022-12-02: 13.6 mg via INTRAVENOUS
  Filled 2022-12-02: qty 13.6

## 2022-12-02 MED ORDER — SODIUM CHLORIDE 0.9% FLUSH
10.0000 mL | Freq: Once | INTRAVENOUS | Status: AC
  Administered 2022-12-02: 10 mL

## 2022-12-02 NOTE — Patient Instructions (Signed)
[facility]  Discharge Instructions: Thank you for choosing [facility] to provide your oncology and hematology care.   If you have a lab appointment with the Cancer Center, please go directly to the Cancer Center and check in at the registration area.   Wear comfortable clothing and clothing appropriate for easy access to any Portacath or PICC line.   We strive to give you quality time with your provider. You may need to reschedule your appointment if you arrive late (15 or more minutes).  Arriving late affects you and other patients whose appointments are after yours.  Also, if you miss three or more appointments without notifying the office, you may be dismissed from the clinic at the provider's discretion.      For prescription refill requests, have your pharmacy contact our office and allow 72 hours for refills to be completed.    Today you received the following chemotherapy and/or immunotherapy agents Adriamycin, Velban, DTIC.      To help prevent nausea and vomiting after your treatment, we encourage you to take your nausea medication as directed.  BELOW ARE SYMPTOMS THAT SHOULD BE REPORTED IMMEDIATELY: *FEVER GREATER THAN 100.4 F (38 C) OR HIGHER *CHILLS OR SWEATING *NAUSEA AND VOMITING THAT IS NOT CONTROLLED WITH YOUR NAUSEA MEDICATION *UNUSUAL SHORTNESS OF BREATH *UNUSUAL BRUISING OR BLEEDING *URINARY PROBLEMS (pain or burning when urinating, or frequent urination) *BOWEL PROBLEMS (unusual diarrhea, constipation, pain near the anus) TENDERNESS IN MOUTH AND THROAT WITH OR WITHOUT PRESENCE OF ULCERS (sore throat, sores in mouth, or a toothache) UNUSUAL RASH, SWELLING OR PAIN  UNUSUAL VAGINAL DISCHARGE OR ITCHING   Items with * indicate a potential emergency and should be followed up as soon as possible or go to the Emergency Department if any problems should occur.  Please show the CHEMOTHERAPY ALERT CARD or IMMUNOTHERAPY  ALERT CARD at check-in to the Emergency Department and triage nurse.  Should you have questions after your visit or need to cancel or reschedule your appointment, please contact [facility]  Dept: [phone]  and follow the prompts.  Office hours are 8:00 a.m. to 4:30 p.m. Monday - Friday. Please note that voicemails left after 4:00 p.m. may not be returned until the following business day.  We are closed weekends and major holidays. You have access to a nurse at all times for urgent questions. Please call the main number to the clinic Dept: [phone] and follow the prompts.   For any non-urgent questions, you may also contact your provider using MyChart. We now offer e-Visits for anyone 18 and older to request care online for non-urgent symptoms. For details visit [email or web address].   Also download the MyChart app! Go to the app store, search "MyChart", open the app, select [place], and log in with your MyChart username and password.   

## 2022-12-02 NOTE — Progress Notes (Signed)
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 male who is here with his father for continued evaluation and management of mediastinal mass concerning for likely lymphoma. He is here to start cycle 4 day 15 of his treatment.   Mr. Hailey reports he is feeling well without any new changes to his health. His energy and appetite are stable. He reports his hands no longer are cramping. He denies nausea, vomiting or abdominal pain. His bowel habits are unchanged without recurrent episodes of diarrhea or constipation.  He denies easy bruising or signs of active bleeding.  Patient denies fevers, chills, sweats, chest pain or cough.He has no other complaints.  MEDICAL HISTORY:  Past Medical History:  Diagnosis Date   ADD (attention deficit disorder)    ADHD (attention deficit hyperactivity disorder)    Asthma    excercise induced and pollen   Auditory processing disorder    Complication of anesthesia    mother states pt. is harder to put under anes. and hard to wake up due to fetal alcohol syndrome; can be combative, per mother; states he will do better if mother is in PACU when he is coming out of anes.   Development delay    states mental status of a 32 year old   Exercise-induced asthma    prn inhaler/neb.   Fetal alcohol syndrome    Narcolepsy    Non-restorable tooth 09/2015   teeth   Separation anxiety    Twin birth     SURGICAL HISTORY: Past Surgical History:  Procedure Laterality Date   IR IMAGING GUIDED PORT INSERTION  08/11/2022   MULTIPLE EXTRACTIONS WITH ALVEOLOPLASTY N/A 09/23/2015   Procedure: MULTIPLE EXTRACTION WITH ALVEOLOPLASTY;  Surgeon: Diona Browner, DDS;  Location:  [facility];  Service: Oral Surgery;  Laterality: N/A;   PYLOROMYOTOMY     age [age], performed found to not have stenosis on surgical exam   PYLOROMYOTOMY     did not have pyloric stenosis; did surgery on the wrong twin   RADIOLOGY WITH ANESTHESIA N/A 01/16/2020   Procedure: MRI WITH ANESTHESIA  BRAIN WITH AND WITHOUT CONTRAST;  Surgeon: Radiologist, Medication, MD;  Location: [facility];  Service: Radiology;  Laterality: N/A;    SOCIAL HISTORY: Social History   Socioeconomic History   Marital status: Single    Spouse name: Not on file   Number of children: Not on file   Years of education: Not on file   Highest education level: Not on file  Occupational History   Not on file  Tobacco Use   Smoking status: Never   Smokeless tobacco: Never  Vaping Use   Vaping Use: Never used  Substance and Sexual Activity   Alcohol use: No   Drug use: No   Sexual activity: Not on file  Other Topics Concern   Not on file  Social History Narrative   ** Merged History Encounter **       Social Determinants of Health   Financial Resource Strain: Not on file  Food Insecurity: Not on file  Transportation Needs: Not on file  Physical Activity: Not on file  Stress: Not on file  Social Connections: Not on file  Intimate Partner Violence: Not on file  FAMILY HISTORY: Family History  Adopted: Yes  Problem Relation Age of Onset   Mental illness Mother    Diabetes Mother    Hypertension Mother    Schizophrenia Mother    Bipolar disorder Mother    Mental retardation Father    Sleep apnea Neg Hx     ALLERGIES:  is allergic to other.  MEDICATIONS:  Current Outpatient Medications  Medication Sig Dispense Refill   albuterol (PROVENTIL HFA;VENTOLIN HFA) 108 (90 BASE) MCG/ACT inhaler Inhale into the lungs every 6 (six) hours as needed for wheezing or shortness of breath.     albuterol (PROVENTIL) (5 MG/ML) 0.5% nebulizer solution Take 2.5 mg by nebulization every 6 (six) hours  as needed for wheezing or shortness of breath.     Cholecalciferol (VITAMIN D3) 250 MCG (10000 UT) capsule Take 10,000 Units by mouth daily.     dexamethasone (DECADRON) 4 MG tablet Take 2 tablets by mouth once a day for 3 days after chemotherapy. Take with food. 30 tablet 1   lidocaine-prilocaine (EMLA) cream Apply to affected area once 30 g 3   loratadine (CLARITIN) 10 MG tablet Take 10 mg by mouth daily.     mometasone-formoterol (DULERA) 200-5 MCG/ACT AERO Inhale 2 puffs into the lungs 2 (two) times daily. 8.8 g 0   Multiple Vitamin (MULTIVITAMIN WITH MINERALS) TABS tablet Take 1 tablet by mouth daily.     ondansetron (ZOFRAN) 4 MG tablet Take 1 tablet (4 mg total) by mouth every 6 (six) hours as needed for nausea. 20 tablet 0   prochlorperazine (COMPAZINE) 10 MG tablet Take 1 tablet (10 mg total) by mouth every 6 (six) hours as needed for nausea or vomiting. 30 tablet 1   triamcinolone (KENALOG) 0.025 % ointment Apply 1 Application topically 2 (two) times daily. To rash on hands 30 g 0   urea (CARMOL) 10 % cream Apply topically 2 (two) times daily. To hands and feet 453 g 1   No current facility-administered medications for this visit.   Facility-Administered Medications Ordered in Other Visits  Medication Dose Route Frequency Provider Last Rate Last Admin   dacarbazine (DTIC) 800 mg in sodium chloride 0.9 % 250 mL chemo infusion  375 mg/m2 (Order-Specific) Intravenous Once Brunetta Genera, MD       DOXOrubicin (ADRIAMYCIN) chemo injection 56 mg  25 mg/m2 (Order-Specific) Intravenous Once Brunetta Genera, MD       heparin lock flush 100 unit/mL  500 Units Intracatheter Once PRN Brunetta Genera, MD       sodium chloride flush (NS) 0.9 % injection 10 mL  10 mL Intracatheter PRN Brunetta Genera, MD       vinBLAStine (VELBAN) 13.6 mg in sodium chloride 0.9 % 50 mL chemo infusion  6 mg/m2 (Order-Specific) Intravenous Once Brunetta Genera, MD        REVIEW OF SYSTEMS:     10 Point review of Systems was done is negative except as noted above.  PHYSICAL EXAMINATION  BP 102/71   Pulse 92   Temp 97.7 F (36.5 C)   Resp 20   Wt 240 lb (108.9 kg)   SpO2 99%   BMI 34.44 kg/m   GENERAL:alert, in no acute distress and comfortable SKIN: no acute rashes, no significant lesions EYES: conjunctiva are pink and non-injected, sclera anicteric OROPHARYNX: MMM, no exudates, no oropharyngeal erythema or ulceration LUNGS: clear to auscultation b/l with normal respiratory effort HEART: regular rate & rhythm Extremity: no pedal edema PSYCH:  alert & oriented x 3 with fluent speech NEURO: no focal motor/sensory deficits   LABORATORY DATA:  I have reviewed the data as listed  .    Latest Ref Rng & Units 12/02/2022    9:49 AM 11/25/2022   12:23 PM 11/12/2022    9:22 AM  CBC  WBC 4.0 - 10.5 K/uL 6.5  2.2  8.1   Hemoglobin 13.0 - 17.0 g/dL 12.7  12.1  12.1   Hematocrit 39.0 - 52.0 % 37.3  36.6  35.4   Platelets 150 - 400 K/uL 256  231  279     .    Latest Ref Rng & Units 12/02/2022    9:49 AM 11/25/2022   12:23 PM 11/12/2022    9:22 AM  CMP  Glucose 70 - 99 mg/dL 99  127  104   BUN 6 - 20 mg/dL 14  10  18    Creatinine 0.61 - 1.24 mg/dL 0.87  0.80  0.82   Sodium 135 - 145 mmol/L 138  138  139   Potassium 3.5 - 5.1 mmol/L 3.8  3.6  3.9   Chloride 98 - 111 mmol/L 104  102  105   CO2 22 - 32 mmol/L 30  30  29    Calcium 8.9 - 10.3 mg/dL 9.3  9.2  9.0   Total Protein 6.5 - 8.1 g/dL 6.9  6.6  6.6   Total Bilirubin 0.3 - 1.2 mg/dL 0.2  0.4  0.2   Alkaline Phos 38 - 126 U/L 95  95  102   AST 15 - 41 U/L 22  23  22    ALT 0 - 44 U/L 28  29  24     . Lab Results  Component Value Date   LDH 171 07/03/2022   RADIOGRAPHIC STUDIES: I have personally reviewed the radiological images as listed and agreed with the findings in the report. No results found.  Surgical Pathology result from 07/03/2022: FINAL MICROSCOPIC DIAGNOSIS:  A. MEDIASTINAL MASS, ANTERIOR,  NEEDLE CORE BIOPSY: -Atypical lymphoid proliferation consistent with classical Hodgkin lymphoma -See comment  COMMENT:  The sections show needle core biopsy fragments displaying a nodular and diffuse lymphoid proliferation associated with dense sclerosis.  The lymphoid process shows predominance of small lymphoid cells admixed to a lesser extent with histiocytes, eosinophils in addition to the large atypical mononuclear and occasionally lobated lymphoid appearing cells with variably prominent nucleoli.  Flow cytometric analysis was performed [facility] [id number]) and shows predominance of CD4-positive T cells. No monoclonal B-cell population identified.  In addition, a battery of immunohistochemical stains was performed including CD30, CD15, mum 1, LCA, CD20, PAX5, CD3, CD5, CD4, CD8, TdT, cytokeratin AE1/AE3, cytokeratin 8/18 and EBV in situ hybridization with appropriate controls.  The large atypical lymphoid appearing cells are positive for CD15, CD30, PAX5, mum 1 and rare cells for CD20.  No significant staining is seen with LCA, EBV, CD3, CD5, CD4, CD8.  Cytokeratin stains highlight a small focus of positivity likely representing native epithelial elements.  No significant TdT positivity is identified.  The small lymphocytes in the background show a mixture of T and B cells with predominance of T cells.  The latter show predominance of CD4 positive cells.  Overall, the features are atypical and most consistent with classical Hodgkin lymphoma which is best subclassified as nodular sclerosis type.   ASSESSMENT & PLAN:  Paul [name] is a 32 y.o. who presents to the clinic for evaluation of Classical Hodgkins lymphoma.  #1 Newly  diagnosed Stage II bulky Classical Hodgkins lymphoma -Presented as a large mediastinal mass causing some compression of his SVC and left brachiocephalic vein but no complete obstruction or symptoms related to this.No constitutional symptoms. -Started systemic  chemotherapy with ABVD on 08/12/2022.  --Mid treatment PET from 10/19/2022 was reviewed and shows decrease in size and FDG avidity of dominant anterior mediastinal mass and additional nodes.   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  -We made referral to ENT for direct visualization.  PLAN: -Due for Cycle 4, Day 15 of AVD today. Holding bleomycin  since Cycle 4, Day 1.  -Labs from today were reviewed and adequate for treatment. WBC 6.5, Hgb 12.7, Pl 256, ANC 3.5. LFTs and creatinine normal.  -Added granix 300 mcg x 3 days with this cycle due to recurrent episodes of neutropenia. -RTC in 2 weeks for labs and follow up visit before Cycle 5, Day 1.   FOLLOW-UP: Per integrated scheduling  All of the patient's questions were answered with apparent satisfaction. The patient knows to call the clinic with any problems, questions or concerns.  I have spent a total of 30 minutes minutes of face-to-face and non-face-to-face time, preparing to see the patient,  performing a medically appropriate examination, counseling and educating the patient, ordering tests/procedures, referring and communicating with other health care professionals, documenting clinical information in the electronic health record, and care coordination.    Dede Query PA-C Dept of Hematology and [place] at [facility] Phone: [phone]

## 2022-12-02 NOTE — Telephone Encounter (Signed)
Per WQ reached out to schedule patient, left voicemail.

## 2022-12-03 ENCOUNTER — Inpatient Hospital Stay: Payer: Medicare Other

## 2022-12-03 VITALS — BP 102/71 | HR 76 | Temp 97.7°F | Resp 20

## 2022-12-03 DIAGNOSIS — Z5111 Encounter for antineoplastic chemotherapy: Secondary | ICD-10-CM | POA: Diagnosis not present

## 2022-12-03 DIAGNOSIS — C8192 Hodgkin lymphoma, unspecified, intrathoracic lymph nodes: Secondary | ICD-10-CM

## 2022-12-03 MED ORDER — FILGRASTIM-SNDZ 300 MCG/0.5ML IJ SOSY
300.0000 ug | PREFILLED_SYRINGE | Freq: Once | INTRAMUSCULAR | Status: AC
  Administered 2022-12-03: 300 ug via SUBCUTANEOUS
  Filled 2022-12-03: qty 0.5

## 2022-12-03 NOTE — Patient Instructions (Signed)
Filgrastim Injection What is this medication? FILGRASTIM (fil GRA stim) lowers the risk of infection in people who are receiving chemotherapy. It works by helping your body make more white blood cells, which protects your body from infection. It may also be used to help people who have been exposed to high doses of radiation. It can be used to help prepare your body before a stem cell transplant. It works by helping your bone marrow make and release stem cells into the blood. This medicine may be used for other purposes; ask your health care provider or pharmacist if you have questions. COMMON BRAND NAME(S): Neupogen, Nivestym, Releuko, Zarxio What should I tell my care team before I take this medication? They need to know if you have any of these conditions: History of blood diseases, such as sickle cell anemia Kidney disease Recent or ongoing radiation An unusual or allergic reaction to filgrastim, pegfilgrastim, latex, rubber, other medications, foods, dyes, or preservatives Pregnant or trying to get pregnant Breast-feeding How should I use this medication? This medication is injected under the skin or into a vein. It is usually given by your care team in a hospital or clinic setting. It may be given at home. If you get this medication at home, you will be taught how to prepare and give it. Use exactly as directed. Take it as directed on the prescription label at the same time every day. Keep taking it unless your care team tells you to stop. It is important that you put your used needles and syringes in a special sharps container. Do not put them in a trash can. If you do not have a sharps container, call your pharmacist or care team to get one. This medication comes with INSTRUCTIONS FOR USE. Ask your pharmacist for directions on how to use this medication. Read the information carefully. Talk to your pharmacist or care team if you have questions. Talk to your care team about the use of this  medication in children. While it may be prescribed for children for selected conditions, precautions do apply. Overdosage: If you think you have taken too much of this medicine contact a poison control center or emergency room at once. NOTE: This medicine is only for you. Do not share this medicine with others. What if I miss a dose? It is important not to miss any doses. Talk to your care team about what to do if you miss a dose. What may interact with this medication? Medications that may cause a release of neutrophils, such as lithium This list may not describe all possible interactions. Give your health care provider a list of all the medicines, herbs, non-prescription drugs, or dietary supplements you use. Also tell them if you smoke, drink alcohol, or use illegal drugs. Some items may interact with your medicine. What should I watch for while using this medication? Your condition will be monitored carefully while you are receiving this medication. You may need bloodwork while taking this medication. Talk to your care team about your risk of cancer. You may be more at risk for certain types of cancer if you take this medication. What side effects may I notice from receiving this medication? Side effects that you should report to your care team as soon as possible: Allergic reactions--skin rash, itching, hives, swelling of the face, lips, tongue, or throat Capillary leak syndrome--stomach or muscle pain, unusual weakness or fatigue, feeling faint or lightheaded, decrease in the amount of urine, swelling of the ankles, hands, or   feet, trouble breathing High white blood cell level--fever, fatigue, trouble breathing, night sweats, change in vision, weight loss Inflammation of the aorta--fever, fatigue, back, chest, or stomach pain, severe headache Kidney injury (glomerulonephritis)--decrease in the amount of urine, red or dark brown urine, foamy or bubbly urine, swelling of the ankles, hands, or  feet Shortness of breath or trouble breathing Spleen injury--pain in upper left stomach or shoulder Unusual bruising or bleeding Side effects that usually do not require medical attention (report to your care team if they continue or are bothersome): Back pain Bone pain Fatigue Fever Headache Nausea This list may not describe all possible side effects. Call your doctor for medical advice about side effects. You may report side effects to FDA at 1-800-FDA-1088. Where should I keep my medication? Keep out of the reach of children and pets. Keep this medication in the original packaging until you are ready to take it. Protect from light. See product for storage information. Each product may have different instructions. Get rid of any unused medication after the expiration date. To get rid of medications that are no longer needed or have expired: Take the medication to a medications take-back program. Check with your pharmacy or law enforcement to find a location. If you cannot return the medication, ask your pharmacist or care team how to get rid of this medication safely. NOTE: This sheet is a summary. It may not cover all possible information. If you have questions about this medicine, talk to your doctor, pharmacist, or health care provider.  2023 Elsevier/Gold Standard (2021-12-02 00:00:00)  

## 2022-12-04 ENCOUNTER — Inpatient Hospital Stay: Payer: Medicare Other

## 2022-12-04 ENCOUNTER — Other Ambulatory Visit: Payer: Self-pay

## 2022-12-04 VITALS — BP 101/63 | HR 84 | Temp 97.8°F | Resp 20

## 2022-12-04 DIAGNOSIS — Z5111 Encounter for antineoplastic chemotherapy: Secondary | ICD-10-CM | POA: Diagnosis not present

## 2022-12-04 DIAGNOSIS — C8192 Hodgkin lymphoma, unspecified, intrathoracic lymph nodes: Secondary | ICD-10-CM

## 2022-12-04 MED ORDER — FILGRASTIM-SNDZ 300 MCG/0.5ML IJ SOSY
300.0000 ug | PREFILLED_SYRINGE | Freq: Once | INTRAMUSCULAR | Status: AC
  Administered 2022-12-04: 300 ug via SUBCUTANEOUS
  Filled 2022-12-04: qty 0.5

## 2022-12-05 ENCOUNTER — Inpatient Hospital Stay: Payer: Medicare Other

## 2022-12-05 VITALS — BP 98/67 | HR 95 | Temp 98.6°F | Resp 18

## 2022-12-05 DIAGNOSIS — C8192 Hodgkin lymphoma, unspecified, intrathoracic lymph nodes: Secondary | ICD-10-CM

## 2022-12-05 DIAGNOSIS — Z5111 Encounter for antineoplastic chemotherapy: Secondary | ICD-10-CM | POA: Diagnosis not present

## 2022-12-05 MED ORDER — FILGRASTIM-SNDZ 300 MCG/0.5ML IJ SOSY
300.0000 ug | PREFILLED_SYRINGE | Freq: Once | INTRAMUSCULAR | Status: AC
  Administered 2022-12-05: 300 ug via SUBCUTANEOUS

## 2022-12-08 ENCOUNTER — Other Ambulatory Visit: Payer: Self-pay

## 2022-12-10 ENCOUNTER — Ambulatory Visit: Payer: Medicare Other

## 2022-12-10 ENCOUNTER — Other Ambulatory Visit: Payer: Medicare Other

## 2022-12-15 ENCOUNTER — Other Ambulatory Visit: Payer: Self-pay | Admitting: Hematology

## 2022-12-16 ENCOUNTER — Inpatient Hospital Stay: Payer: Medicare Other | Attending: Hematology

## 2022-12-16 ENCOUNTER — Other Ambulatory Visit: Payer: Self-pay

## 2022-12-16 ENCOUNTER — Telehealth: Payer: Self-pay

## 2022-12-16 ENCOUNTER — Inpatient Hospital Stay: Payer: Medicare Other

## 2022-12-16 VITALS — BP 108/75 | HR 93 | Temp 98.0°F | Resp 18

## 2022-12-16 DIAGNOSIS — D701 Agranulocytosis secondary to cancer chemotherapy: Secondary | ICD-10-CM | POA: Diagnosis not present

## 2022-12-16 DIAGNOSIS — C8192 Hodgkin lymphoma, unspecified, intrathoracic lymph nodes: Secondary | ICD-10-CM

## 2022-12-16 DIAGNOSIS — Z95828 Presence of other vascular implants and grafts: Secondary | ICD-10-CM

## 2022-12-16 DIAGNOSIS — Z5111 Encounter for antineoplastic chemotherapy: Secondary | ICD-10-CM | POA: Insufficient documentation

## 2022-12-16 DIAGNOSIS — C8170 Other classical Hodgkin lymphoma, unspecified site: Secondary | ICD-10-CM | POA: Diagnosis not present

## 2022-12-16 DIAGNOSIS — C8102 Nodular lymphocyte predominant Hodgkin lymphoma, intrathoracic lymph nodes: Secondary | ICD-10-CM

## 2022-12-16 LAB — CMP (CANCER CENTER ONLY)
ALT: 30 U/L (ref 0–44)
AST: 22 U/L (ref 15–41)
Albumin: 4 g/dL (ref 3.5–5.0)
Alkaline Phosphatase: 100 U/L (ref 38–126)
Anion gap: 4 — ABNORMAL LOW (ref 5–15)
BUN: 6 mg/dL (ref 6–20)
CO2: 30 mmol/L (ref 22–32)
Calcium: 9.1 mg/dL (ref 8.9–10.3)
Chloride: 104 mmol/L (ref 98–111)
Creatinine: 0.79 mg/dL (ref 0.61–1.24)
GFR, Estimated: >60 mL/min (ref >60)
Glucose, Bld: 108 mg/dL — ABNORMAL HIGH (ref 70–99)
Potassium: 3.4 mmol/L — ABNORMAL LOW (ref 3.5–5.1)
Sodium: 138 mmol/L (ref 135–145)
Total Bilirubin: 0.2 mg/dL — ABNORMAL LOW (ref 0.3–1.2)
Total Protein: 6.5 g/dL (ref 6.5–8.1)

## 2022-12-16 LAB — CBC WITH DIFFERENTIAL (CANCER CENTER ONLY)
Abs Immature Granulocytes: 0.03 10*3/uL (ref 0.00–0.07)
Basophils Absolute: 0 10*3/uL (ref 0.0–0.1)
Basophils Relative: 1 %
Eosinophils Absolute: 0.1 10*3/uL (ref 0.0–0.5)
Eosinophils Relative: 3 %
HCT: 36.1 % — ABNORMAL LOW (ref 39.0–52.0)
Hemoglobin: 12.2 g/dL — ABNORMAL LOW (ref 13.0–17.0)
Immature Granulocytes: 2 %
Lymphocytes Relative: 38 %
Lymphs Abs: 0.6 10*3/uL — ABNORMAL LOW (ref 0.7–4.0)
MCH: 28.8 pg (ref 26.0–34.0)
MCHC: 33.8 g/dL (ref 30.0–36.0)
MCV: 85.3 fL (ref 80.0–100.0)
Monocytes Absolute: 0.5 10*3/uL (ref 0.1–1.0)
Monocytes Relative: 30 %
Neutro Abs: 0.4 10*3/uL — CL (ref 1.7–7.7)
Neutrophils Relative %: 26 %
Platelet Count: 197 10*3/uL (ref 150–400)
RBC: 4.23 MIL/uL (ref 4.22–5.81)
RDW: 15.5 % (ref 11.5–15.5)
WBC Count: 1.7 10*3/uL — ABNORMAL LOW (ref 4.0–10.5)
nRBC: 0 % (ref 0.0–0.2)

## 2022-12-16 MED ORDER — SODIUM CHLORIDE 0.9 % IV SOLN
375.0000 mg/m2 | Freq: Once | INTRAVENOUS | Status: AC
  Administered 2022-12-16: 800 mg via INTRAVENOUS
  Filled 2022-12-16: qty 80

## 2022-12-16 MED ORDER — SODIUM CHLORIDE 0.9 % IV SOLN: Freq: Once | INTRAVENOUS | Status: AC

## 2022-12-16 MED ORDER — SODIUM CHLORIDE 0.9% FLUSH
10.0000 mL | INTRAVENOUS | Status: DC | PRN
  Administered 2022-12-16: 10 mL

## 2022-12-16 MED ORDER — VINBLASTINE SULFATE CHEMO INJECTION 1 MG/ML
6.0000 mg/m2 | Freq: Once | INTRAVENOUS | Status: AC
  Administered 2022-12-16: 13.6 mg via INTRAVENOUS
  Filled 2022-12-16: qty 13.6

## 2022-12-16 MED ORDER — DOXORUBICIN HCL CHEMO IV INJECTION 2 MG/ML
25.0000 mg/m2 | Freq: Once | INTRAVENOUS | Status: AC
  Administered 2022-12-16: 56 mg via INTRAVENOUS
  Filled 2022-12-16: qty 28

## 2022-12-16 MED ORDER — SODIUM CHLORIDE 0.9 % IV SOLN
10.0000 mg | Freq: Once | INTRAVENOUS | Status: AC
  Administered 2022-12-16: 10 mg via INTRAVENOUS
  Filled 2022-12-16: qty 10

## 2022-12-16 MED ORDER — SODIUM CHLORIDE 0.9% FLUSH
10.0000 mL | Freq: Once | INTRAVENOUS | Status: AC
  Administered 2022-12-16: 10 mL

## 2022-12-16 MED ORDER — HEPARIN SOD (PORK) LOCK FLUSH 100 UNIT/ML IV SOLN
500.0000 [IU] | Freq: Once | INTRAVENOUS | Status: AC | PRN
  Administered 2022-12-16: 500 [IU]

## 2022-12-16 MED ORDER — PALONOSETRON HCL INJECTION 0.25 MG/5ML
0.2500 mg | Freq: Once | INTRAVENOUS | Status: AC
  Administered 2022-12-16: 0.25 mg via INTRAVENOUS
  Filled 2022-12-16: qty 5

## 2022-12-16 MED ORDER — SODIUM CHLORIDE 0.9 % IV SOLN
150.0000 mg | Freq: Once | INTRAVENOUS | Status: AC
  Administered 2022-12-16: 150 mg via INTRAVENOUS
  Filled 2022-12-16: qty 150

## 2022-12-16 NOTE — Progress Notes (Signed)
Per MD, ok to treat today with ANC 0.4.

## 2022-12-16 NOTE — Patient Instructions (Signed)
[facility]  Discharge Instructions: Thank you for choosing [facility] to provide your oncology and hematology care.   If you have a lab appointment with the Cancer Center, please go directly to the Cancer Center and check in at the registration area.   Wear comfortable clothing and clothing appropriate for easy access to any Portacath or PICC line.   We strive to give you quality time with your provider. You may need to reschedule your appointment if you arrive late (15 or more minutes).  Arriving late affects you and other patients whose appointments are after yours.  Also, if you miss three or more appointments without notifying the office, you may be dismissed from the clinic at the provider's discretion.      For prescription refill requests, have your pharmacy contact our office and allow 72 hours for refills to be completed.    Today you received the following chemotherapy and/or immunotherapy agents: Doxorubicin, Vinblastine, & Dacarbazine       To help prevent nausea and vomiting after your treatment, we encourage you to take your nausea medication as directed.  BELOW ARE SYMPTOMS THAT SHOULD BE REPORTED IMMEDIATELY: *FEVER GREATER THAN 100.4 F (38 C) OR HIGHER *CHILLS OR SWEATING *NAUSEA AND VOMITING THAT IS NOT CONTROLLED WITH YOUR NAUSEA MEDICATION *UNUSUAL SHORTNESS OF BREATH *UNUSUAL BRUISING OR BLEEDING *URINARY PROBLEMS (pain or burning when urinating, or frequent urination) *BOWEL PROBLEMS (unusual diarrhea, constipation, pain near the anus) TENDERNESS IN MOUTH AND THROAT WITH OR WITHOUT PRESENCE OF ULCERS (sore throat, sores in mouth, or a toothache) UNUSUAL RASH, SWELLING OR PAIN  UNUSUAL VAGINAL DISCHARGE OR ITCHING   Items with * indicate a potential emergency and should be followed up as soon as possible or go to the Emergency Department if any problems should occur.  Please show the CHEMOTHERAPY ALERT CARD or  IMMUNOTHERAPY ALERT CARD at check-in to the Emergency Department and triage nurse.  Should you have questions after your visit or need to cancel or reschedule your appointment, please contact [facility]  Dept: [phone]  and follow the prompts.  Office hours are 8:00 a.m. to 4:30 p.m. Monday - Friday. Please note that voicemails left after 4:00 p.m. may not be returned until the following business day.  We are closed weekends and major holidays. You have access to a nurse at all times for urgent questions. Please call the main number to the clinic Dept: [phone] and follow the prompts.   For any non-urgent questions, you may also contact your provider using MyChart. We now offer e-Visits for anyone 18 and older to request care online for non-urgent symptoms. For details visit [email or web address].   Also download the MyChart app! Go to the app store, search "MyChart", open the app, select [facility], and log in with your MyChart username and password. 

## 2022-12-16 NOTE — Telephone Encounter (Signed)
CRITICAL VALUE STICKER  CRITICAL VALUE:    ANC 0.42  RECEIVER (on-site recipient of call):   Daneil Dolin, LPN  DATE & TIME NOTIFIED:   09:49   12/16/22  MESSENGER (representative from lab):   Zetta Bills  MD NOTIFIED:   Erenest Blank  TIME OF NOTIFICATION:  09:50  RESPONSE:

## 2022-12-17 ENCOUNTER — Inpatient Hospital Stay: Payer: Medicare Other

## 2022-12-17 VITALS — BP 105/61 | HR 88 | Temp 97.7°F | Resp 18

## 2022-12-17 DIAGNOSIS — Z5111 Encounter for antineoplastic chemotherapy: Secondary | ICD-10-CM | POA: Diagnosis not present

## 2022-12-17 DIAGNOSIS — C8192 Hodgkin lymphoma, unspecified, intrathoracic lymph nodes: Secondary | ICD-10-CM

## 2022-12-17 MED ORDER — FILGRASTIM-SNDZ 300 MCG/0.5ML IJ SOSY
300.0000 ug | PREFILLED_SYRINGE | Freq: Once | INTRAMUSCULAR | Status: AC
  Administered 2022-12-17: 300 ug via SUBCUTANEOUS
  Filled 2022-12-17: qty 0.5

## 2022-12-17 NOTE — Patient Instructions (Signed)
Filgrastim Injection What is this medication? FILGRASTIM (fil GRA stim) lowers the risk of infection in people who are receiving chemotherapy. It works by helping your body make more white blood cells, which protects your body from infection. It may also be used to help people who have been exposed to high doses of radiation. It can be used to help prepare your body before a stem cell transplant. It works by helping your bone marrow make and release stem cells into the blood. This medicine may be used for other purposes; ask your health care provider or pharmacist if you have questions. COMMON BRAND NAME(S): Neupogen, Nivestym, Releuko, Zarxio What should I tell my care team before I take this medication? They need to know if you have any of these conditions: History of blood diseases, such as sickle cell anemia Kidney disease Recent or ongoing radiation An unusual or allergic reaction to filgrastim, pegfilgrastim, latex, rubber, other medications, foods, dyes, or preservatives Pregnant or trying to get pregnant Breast-feeding How should I use this medication? This medication is injected under the skin or into a vein. It is usually given by your care team in a hospital or clinic setting. It may be given at home. If you get this medication at home, you will be taught how to prepare and give it. Use exactly as directed. Take it as directed on the prescription label at the same time every day. Keep taking it unless your care team tells you to stop. It is important that you put your used needles and syringes in a special sharps container. Do not put them in a trash can. If you do not have a sharps container, call your pharmacist or care team to get one. This medication comes with INSTRUCTIONS FOR USE. Ask your pharmacist for directions on how to use this medication. Read the information carefully. Talk to your pharmacist or care team if you have questions. Talk to your care team about the use of this  medication in children. While it may be prescribed for children for selected conditions, precautions do apply. Overdosage: If you think you have taken too much of this medicine contact a poison control center or emergency room at once. NOTE: This medicine is only for you. Do not share this medicine with others. What if I miss a dose? It is important not to miss any doses. Talk to your care team about what to do if you miss a dose. What may interact with this medication? Medications that may cause a release of neutrophils, such as lithium This list may not describe all possible interactions. Give your health care provider a list of all the medicines, herbs, non-prescription drugs, or dietary supplements you use. Also tell them if you smoke, drink alcohol, or use illegal drugs. Some items may interact with your medicine. What should I watch for while using this medication? Your condition will be monitored carefully while you are receiving this medication. You may need bloodwork while taking this medication. Talk to your care team about your risk of cancer. You may be more at risk for certain types of cancer if you take this medication. What side effects may I notice from receiving this medication? Side effects that you should report to your care team as soon as possible: Allergic reactions--skin rash, itching, hives, swelling of the face, lips, tongue, or throat Capillary leak syndrome--stomach or muscle pain, unusual weakness or fatigue, feeling faint or lightheaded, decrease in the amount of urine, swelling of the ankles, hands, or   feet, trouble breathing High white blood cell level--fever, fatigue, trouble breathing, night sweats, change in vision, weight loss Inflammation of the aorta--fever, fatigue, back, chest, or stomach pain, severe headache Kidney injury (glomerulonephritis)--decrease in the amount of urine, red or dark brown urine, foamy or bubbly urine, swelling of the ankles, hands, or  feet Shortness of breath or trouble breathing Spleen injury--pain in upper left stomach or shoulder Unusual bruising or bleeding Side effects that usually do not require medical attention (report to your care team if they continue or are bothersome): Back pain Bone pain Fatigue Fever Headache Nausea This list may not describe all possible side effects. Call your doctor for medical advice about side effects. You may report side effects to FDA at 1-800-FDA-1088. Where should I keep my medication? Keep out of the reach of children and pets. Keep this medication in the original packaging until you are ready to take it. Protect from light. See product for storage information. Each product may have different instructions. Get rid of any unused medication after the expiration date. To get rid of medications that are no longer needed or have expired: Take the medication to a medications take-back program. Check with your pharmacy or law enforcement to find a location. If you cannot return the medication, ask your pharmacist or care team how to get rid of this medication safely. NOTE: This sheet is a summary. It may not cover all possible information. If you have questions about this medicine, talk to your doctor, pharmacist, or health care provider.  2023 Elsevier/Gold Standard (2021-12-02 00:00:00)  

## 2022-12-18 ENCOUNTER — Other Ambulatory Visit: Payer: Self-pay

## 2022-12-18 ENCOUNTER — Inpatient Hospital Stay: Payer: Medicare Other

## 2022-12-18 VITALS — BP 96/62 | HR 65 | Temp 98.2°F | Resp 20

## 2022-12-18 DIAGNOSIS — C8192 Hodgkin lymphoma, unspecified, intrathoracic lymph nodes: Secondary | ICD-10-CM

## 2022-12-18 DIAGNOSIS — Z5111 Encounter for antineoplastic chemotherapy: Secondary | ICD-10-CM | POA: Diagnosis not present

## 2022-12-18 MED ORDER — FILGRASTIM-SNDZ 300 MCG/0.5ML IJ SOSY
300.0000 ug | PREFILLED_SYRINGE | Freq: Once | INTRAMUSCULAR | Status: AC
  Administered 2022-12-18: 300 ug via SUBCUTANEOUS
  Filled 2022-12-18: qty 0.5

## 2022-12-19 ENCOUNTER — Inpatient Hospital Stay: Payer: Medicare Other

## 2022-12-19 VITALS — BP 105/64 | HR 64 | Temp 97.5°F | Resp 20

## 2022-12-19 DIAGNOSIS — Z5111 Encounter for antineoplastic chemotherapy: Secondary | ICD-10-CM | POA: Diagnosis not present

## 2022-12-19 DIAGNOSIS — C8192 Hodgkin lymphoma, unspecified, intrathoracic lymph nodes: Secondary | ICD-10-CM

## 2022-12-19 MED ORDER — FILGRASTIM-SNDZ 300 MCG/0.5ML IJ SOSY
300.0000 ug | PREFILLED_SYRINGE | Freq: Once | INTRAMUSCULAR | Status: AC
  Administered 2022-12-19: 300 ug via SUBCUTANEOUS

## 2022-12-24 ENCOUNTER — Other Ambulatory Visit: Payer: Self-pay

## 2022-12-24 DIAGNOSIS — C8192 Hodgkin lymphoma, unspecified, intrathoracic lymph nodes: Secondary | ICD-10-CM

## 2022-12-25 ENCOUNTER — Ambulatory Visit: Payer: Medicare Other

## 2022-12-25 ENCOUNTER — Other Ambulatory Visit: Payer: Medicare Other

## 2022-12-25 ENCOUNTER — Inpatient Hospital Stay (HOSPITAL_BASED_OUTPATIENT_CLINIC_OR_DEPARTMENT_OTHER): Payer: Medicare Other | Admitting: Hematology

## 2022-12-25 ENCOUNTER — Inpatient Hospital Stay: Payer: Medicare Other

## 2022-12-25 ENCOUNTER — Other Ambulatory Visit: Payer: Self-pay

## 2022-12-25 VITALS — BP 112/77 | HR 84 | Temp 98.4°F | Resp 17 | Wt 240.2 lb

## 2022-12-25 DIAGNOSIS — Z95828 Presence of other vascular implants and grafts: Secondary | ICD-10-CM

## 2022-12-25 DIAGNOSIS — C8102 Nodular lymphocyte predominant Hodgkin lymphoma, intrathoracic lymph nodes: Secondary | ICD-10-CM

## 2022-12-25 DIAGNOSIS — Z5111 Encounter for antineoplastic chemotherapy: Secondary | ICD-10-CM

## 2022-12-25 DIAGNOSIS — C8192 Hodgkin lymphoma, unspecified, intrathoracic lymph nodes: Secondary | ICD-10-CM

## 2022-12-25 LAB — CBC WITH DIFFERENTIAL (CANCER CENTER ONLY)
Abs Immature Granulocytes: 0.05 10*3/uL (ref 0.00–0.07)
Basophils Absolute: 0 10*3/uL (ref 0.0–0.1)
Basophils Relative: 1 %
Eosinophils Absolute: 0.1 10*3/uL (ref 0.0–0.5)
Eosinophils Relative: 3 %
HCT: 35.7 % — ABNORMAL LOW (ref 39.0–52.0)
Hemoglobin: 11.8 g/dL — ABNORMAL LOW (ref 13.0–17.0)
Immature Granulocytes: 2 %
Lymphocytes Relative: 65 %
Lymphs Abs: 1.4 10*3/uL (ref 0.7–4.0)
MCH: 28.4 pg (ref 26.0–34.0)
MCHC: 33.1 g/dL (ref 30.0–36.0)
MCV: 85.8 fL (ref 80.0–100.0)
Monocytes Absolute: 0.4 10*3/uL (ref 0.1–1.0)
Monocytes Relative: 17 %
Neutro Abs: 0.3 10*3/uL — CL (ref 1.7–7.7)
Neutrophils Relative %: 12 %
Platelet Count: 264 10*3/uL (ref 150–400)
RBC: 4.16 MIL/uL — ABNORMAL LOW (ref 4.22–5.81)
RDW: 14.9 % (ref 11.5–15.5)
WBC Count: 2.2 10*3/uL — ABNORMAL LOW (ref 4.0–10.5)
nRBC: 0 % (ref 0.0–0.2)

## 2022-12-25 LAB — CMP (CANCER CENTER ONLY)
ALT: 24 U/L (ref 0–44)
AST: 17 U/L (ref 15–41)
Albumin: 3.9 g/dL (ref 3.5–5.0)
Alkaline Phosphatase: 93 U/L (ref 38–126)
Anion gap: 5 (ref 5–15)
BUN: 12 mg/dL (ref 6–20)
CO2: 30 mmol/L (ref 22–32)
Calcium: 9.2 mg/dL (ref 8.9–10.3)
Chloride: 102 mmol/L (ref 98–111)
Creatinine: 0.81 mg/dL (ref 0.61–1.24)
GFR, Estimated: >60 mL/min (ref >60)
Glucose, Bld: 99 mg/dL (ref 70–99)
Potassium: 3.8 mmol/L (ref 3.5–5.1)
Sodium: 137 mmol/L (ref 135–145)
Total Bilirubin: 0.2 mg/dL — ABNORMAL LOW (ref 0.3–1.2)
Total Protein: 6.4 g/dL — ABNORMAL LOW (ref 6.5–8.1)

## 2022-12-25 MED ORDER — SODIUM CHLORIDE 0.9% FLUSH
10.0000 mL | Freq: Once | INTRAVENOUS | Status: AC
  Administered 2022-12-25: 10 mL

## 2022-12-25 MED ORDER — HEPARIN SOD (PORK) LOCK FLUSH 100 UNIT/ML IV SOLN
500.0000 [IU] | Freq: Once | INTRAVENOUS | Status: AC
  Administered 2022-12-25: 500 [IU]

## 2022-12-25 MED ORDER — FILGRASTIM-SNDZ 300 MCG/0.5ML IJ SOSY
300.0000 ug | PREFILLED_SYRINGE | Freq: Once | INTRAMUSCULAR | Status: AC
  Administered 2022-12-25: 300 ug via SUBCUTANEOUS
  Filled 2022-12-25: qty 0.5

## 2022-12-25 NOTE — Progress Notes (Signed)
CRITICAL VALUE STICKER  CRITICAL VALUE: ANC: 0.3  DATE & TIME NOTIFIED: 12/25/22 11:35  MD NOTIFIED: Dr Candise Che  TIME OF NOTIFICATION: 11:45  RESPONSE:  Pt to be seen by MD today

## 2022-12-25 NOTE — Progress Notes (Signed)
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 male who has been referred to [place] by .Lazoff, Shawn P, DO and Dr. Clydell Hakim MD for evaluation and management of newly diagnosed mediastinal mass concerning for likely lymphoma.  Patient has a history of fetal alcohol syndrome with developmental delay, narcolepsy, ADD, exercise-induced asthma and lives with his adoptive parents. He recently presented to the hospital on 07/01/2022 with 2-week history of worsening shortness of breath cough and wheezing.  He received his asthma treatment but was still noted to have significant cough.  In the emergency room he had a chest x-ray which showed an mediastinal mass. Subsequent CT chest with contrast on 07/01/2022 showed extensive mediastinal and hilar lymphadenopathy with conglomerate soft tissue mass/adenopathy within the anterior mediastinum measuring 11.2 x 7.9 x 13 cm. Compression of the left brachiocephalic and SVC within the mid though these vascular structures remain patent.  Patient did not have any clinical signs or symptoms of SVC compression syndrome or left upper extremity swelling.  Patient subsequently had a CT of the abdomen and pelvis which showed no acute intra-abdominal or intrapelvic abnormalities. He underwent a CT-guided core needle biopsy of the mediastinal mass by interventional radiology.  The official pathology results from his biopsy are not currently available at the time of this clinic visit. I did call and talk to the pathologist Dr. [place] Callas and she notes that this either looks like a lymphoma or thymoma and she is running additional tests to make a final  diagnosis.  Patient's father accompanied him for this visit and his mother who helps make most of the decisions in tandem with his father was present on the phone.  They do not note any unexplained fevers chills night sweats or significant weight loss.  His breathing has been relatively stable since hospital discharge but he is still having some cough.  I confirmed adequacy of tissue sampling with the pathologist and the patient was then started on prednisone to try to help his cough by drinking his mediastinal tumor some and reducing bronchial inflammation.  Patient is unable to provide much information or review of systems on account of his developmental delay issues.  Interval History:   Paul [name] is a wonderful 32 y.o. male who is here for continued evaluation and management of mediastinal mass concerning for likely lymphoma.   Patient was last seen by me on 11/03/2022 and complained of bilateral hand weakness/numbness, persistent SOB on exertion, and nausea.  Patient was last seen by PA Thayil on 12/02/2022 and was doing well overall with no new medical concerns.  Today, he is accompanied by his father. He reports that he has been eating well. He denies any fever or chills. He has vomited on a few occasions after meals. He does continue to take anti-nausea medication. Patient denies any tingling/numbness in the hands/feet, throat pain, other new lumps/bumps, back pain, or abdominal pain. Patient does take vitamin B complex regularly.  MEDICAL HISTORY:  Past Medical History:  Diagnosis Date   ADD (attention deficit disorder)    ADHD (attention deficit hyperactivity disorder)    Asthma    excercise induced and pollen   Auditory processing disorder    Complication of anesthesia  mother states pt. is harder to put under anes. and hard to wake up due to fetal alcohol syndrome; can be combative, per mother; states he will do better if mother is in PACU when he is coming out of anes.    Development delay    states mental status of a 32 year old   Exercise-induced asthma    prn inhaler/neb.   Fetal alcohol syndrome    Narcolepsy    Non-restorable tooth 09/2015   teeth   Separation anxiety    Twin birth     SURGICAL HISTORY: Past Surgical History:  Procedure Laterality Date   IR IMAGING GUIDED PORT INSERTION  08/11/2022   MULTIPLE EXTRACTIONS WITH ALVEOLOPLASTY N/A 09/23/2015   Procedure: MULTIPLE EXTRACTION WITH ALVEOLOPLASTY;  Surgeon: Ocie Doyne, DDS;  Location: [facility];  Service: Oral Surgery;  Laterality: N/A;   PYLOROMYOTOMY     age [age], performed found to not have stenosis on surgical exam   PYLOROMYOTOMY     did not have pyloric stenosis; did surgery on the wrong twin   RADIOLOGY WITH ANESTHESIA N/A 01/16/2020   Procedure: MRI WITH ANESTHESIA  BRAIN WITH AND WITHOUT CONTRAST;  Surgeon: Radiologist, Medication, MD;  Location: [facility] OR;  Service: Radiology;  Laterality: N/A;    SOCIAL HISTORY: Social History   Socioeconomic History   Marital status: Single    Spouse name: Not on file   Number of children: Not on file   Years of education: Not on file   Highest education level: Not on file  Occupational History   Not on file  Tobacco Use   Smoking status: Never   Smokeless tobacco: Never  Vaping Use   Vaping Use: Never used  Substance and Sexual Activity   Alcohol use: No   Drug use: No   Sexual activity: Not on file  Other Topics Concern   Not on file  Social History Narrative   ** Merged History Encounter **       Social Determinants of Health   Financial Resource Strain: Not on file  Food Insecurity: Not on file  Transportation Needs: Not on file  Physical Activity: Not on file  Stress: Not on file  Social Connections: Not on file  Intimate Partner Violence: Not on file    FAMILY HISTORY: Family History  Adopted: Yes  Problem Relation Age of Onset   Mental illness Mother    Diabetes Mother    Hypertension  Mother    Schizophrenia Mother    Bipolar disorder Mother    Mental retardation Father    Sleep apnea Neg Hx     ALLERGIES:  is allergic to other.  MEDICATIONS:  Current Outpatient Medications  Medication Sig Dispense Refill   albuterol (PROVENTIL HFA;VENTOLIN HFA) 108 (90 BASE) MCG/ACT inhaler Inhale into the lungs every 6 (six) hours as needed for wheezing or shortness of breath.     albuterol (PROVENTIL) (5 MG/ML) 0.5% nebulizer solution Take 2.5 mg by nebulization every 6 (six) hours as needed for wheezing or shortness of breath.     Cholecalciferol (VITAMIN D3) 250 MCG (10000 UT) capsule Take 10,000 Units by mouth daily.     dexamethasone (DECADRON) 4 MG tablet Take 2 tablets by mouth once a day for 3 days after chemotherapy. Take with food. 30 tablet 1   lidocaine-prilocaine (EMLA) cream Apply to affected area once 30 g 3   loratadine (CLARITIN) 10 MG tablet Take 10 mg by mouth daily.     mometasone-formoterol (DULERA)  200-5 MCG/ACT AERO Inhale 2 puffs into the lungs 2 (two) times daily. 8.8 g 0   Multiple Vitamin (MULTIVITAMIN WITH MINERALS) TABS tablet Take 1 tablet by mouth daily.     ondansetron (ZOFRAN) 4 MG tablet Take 1 tablet (4 mg total) by mouth every 6 (six) hours as needed for nausea. 20 tablet 0   prochlorperazine (COMPAZINE) 10 MG tablet Take 1 tablet (10 mg total) by mouth every 6 (six) hours as needed for nausea or vomiting. 30 tablet 1   triamcinolone (KENALOG) 0.025 % ointment Apply 1 Application topically 2 (two) times daily. To rash on hands 30 g 0   urea (CARMOL) 10 % cream Apply topically 2 (two) times daily. To hands and feet 453 g 1   No current facility-administered medications for this visit.    REVIEW OF SYSTEMS:    10 Point review of Systems was done is negative except as noted above.   PHYSICAL EXAMINATION  There were no vitals taken for this visit.   GENERAL:alert, in no acute distress and comfortable SKIN: no acute rashes, no significant  lesions EYES: conjunctiva are pink and non-injected, sclera anicteric OROPHARYNX: MMM, no exudates, no oropharyngeal erythema or ulceration NECK: supple, no JVD LYMPH:  no palpable lymphadenopathy in the cervical, axillary or inguinal regions LUNGS: clear to auscultation b/l with normal respiratory effort HEART: regular rate & rhythm ABDOMEN:  normoactive bowel sounds , non tender, not distended. Extremity: no pedal edema PSYCH: alert & oriented x 3 with fluent speech NEURO: no focal motor/sensory deficits    LABORATORY DATA:  I have reviewed the data as listed  .    Latest Ref Rng & Units 12/16/2022    9:19 AM 12/02/2022    9:49 AM 11/25/2022   12:23 PM  CBC  WBC 4.0 - 10.5 K/uL 1.7  6.5  2.2   Hemoglobin 13.0 - 17.0 g/dL 16.1  09.6  04.5   Hematocrit 39.0 - 52.0 % 36.1  37.3  36.6   Platelets 150 - 400 K/uL 197  256  231    ANC 300  .    Latest Ref Rng & Units 12/16/2022    9:19 AM 12/02/2022    9:49 AM 11/25/2022   12:23 PM  CMP  Glucose 70 - 99 mg/dL 409  99  811   BUN 6 - 20 mg/dL 6  14  10    Creatinine 0.61 - 1.24 mg/dL 9.14  7.82  9.56   Sodium 135 - 145 mmol/L 138  138  138   Potassium 3.5 - 5.1 mmol/L 3.4  3.8  3.6   Chloride 98 - 111 mmol/L 104  104  102   CO2 22 - 32 mmol/L 30  30  30    Calcium 8.9 - 10.3 mg/dL 9.1  9.3  9.2   Total Protein 6.5 - 8.1 g/dL 6.5  6.9  6.6   Total Bilirubin 0.3 - 1.2 mg/dL 0.2  0.2  0.4   Alkaline Phos 38 - 126 U/L 100  95  95   AST 15 - 41 U/L 22  22  23    ALT 0 - 44 U/L 30  28  29     . Lab Results  Component Value Date   LDH 171 07/03/2022   RADIOGRAPHIC STUDIES: I have personally reviewed the radiological images as listed and agreed with the findings in the report. No results found.  Surgical Pathology result from 07/03/2022: FINAL MICROSCOPIC DIAGNOSIS:  A. MEDIASTINAL MASS, ANTERIOR, NEEDLE  CORE BIOPSY: -Atypical lymphoid proliferation consistent with classical Hodgkin lymphoma -See comment  COMMENT:  The  sections show needle core biopsy fragments displaying a nodular and diffuse lymphoid proliferation associated with dense sclerosis.  The lymphoid process shows predominance of small lymphoid cells admixed to a lesser extent with histiocytes, eosinophils in addition to the large atypical mononuclear and occasionally lobated lymphoid appearing cells with variably prominent nucleoli.  Flow cytometric analysis was performed [facility] [phone]) and shows predominance of CD4-positive T cells. No monoclonal B-cell population identified.  In addition, a battery of immunohistochemical stains was performed including CD30, CD15, mum 1, LCA, CD20, PAX5, CD3, CD5, CD4, CD8, TdT, cytokeratin AE1/AE3, cytokeratin 8/18 and EBV in situ hybridization with appropriate controls.  The large atypical lymphoid appearing cells are positive for CD15, CD30, PAX5, mum 1 and rare cells for CD20.  No significant staining is seen with LCA, EBV, CD3, CD5, CD4, CD8.  Cytokeratin stains highlight a small focus of positivity likely representing native epithelial elements.  No significant TdT positivity is identified.  The small lymphocytes in the background show a mixture of T and B cells with predominance of T cells.  The latter show predominance of CD4 positive cells.  Overall, the features are atypical and most consistent with classical Hodgkin lymphoma which is best subclassified as nodular sclerosis type.   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 The mass was causing some compression of his SVC and left brachiocephalic vein but no complete obstruction or symptoms related to this. No constitutional symptoms.  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  PLAN:  -Discussed lab results on 12/25/2022 with patient in detail. CBC showed WBC of 2.2K, hemoglobin of 11.8, and platelets of 264K. -Patient continues to be neutropenic -patient is scheduled to receive cycle 5 day 15 on 12/30/2022 -Would recommend additional growth factor injections to boost WBC and reduce risk of infections -Patient endorses mild inflammation of left tonsil -Will postpone ENT evaluation of tonsils until repeat PET scan after treatment -Continue B-complex supplement -Continue to eat well and stay hydrated, recommend approximately 2L of water daily. -Discontinued bleomysin since cycle 4 of treatment -Answered all of patient's father's questions regarding treatment.   FOLLOW-UP: Additional G-CSF today and tomorrow Schedule remaining remaining treatments per integrated scheduling  The total time spent in the appointment was *** minutes* .  All of the patient's questions were answered with apparent satisfaction. The patient knows to call the clinic with any problems, questions or concerns.   Wyvonnia Lora MD MS AAHIVMS [facility] Hematology/Oncology Physician [facility]  .*Total Encounter Time as defined by the Centers for Medicare and Medicaid Services includes, in addition to the face-to-face time of a patient visit (documented in the note above) non-face-to-face time: obtaining and reviewing outside history, ordering and reviewing medications, tests or procedures, care coordination (communications with other health care professionals or caregivers) and documentation in the medical record.    I,Mitra Faeizi,acting as a Neurosurgeon for Wyvonnia Lora, MD.,have documented all relevant documentation on the behalf of Wyvonnia Lora, MD,as directed by  Wyvonnia Lora, MD while in the presence of Wyvonnia Lora, MD.  ***

## 2022-12-26 ENCOUNTER — Inpatient Hospital Stay: Payer: Medicare Other

## 2022-12-26 ENCOUNTER — Encounter: Payer: Self-pay | Admitting: Hematology

## 2022-12-26 VITALS — BP 111/67 | HR 97 | Temp 98.2°F | Resp 17

## 2022-12-26 DIAGNOSIS — C8192 Hodgkin lymphoma, unspecified, intrathoracic lymph nodes: Secondary | ICD-10-CM

## 2022-12-26 DIAGNOSIS — Z5111 Encounter for antineoplastic chemotherapy: Secondary | ICD-10-CM | POA: Diagnosis not present

## 2022-12-26 MED ORDER — FILGRASTIM-SNDZ 300 MCG/0.5ML IJ SOSY
300.0000 ug | PREFILLED_SYRINGE | Freq: Once | INTRAMUSCULAR | Status: AC
  Administered 2022-12-26: 300 ug via SUBCUTANEOUS

## 2022-12-29 MED FILL — Dexamethasone Sodium Phosphate Inj 100 MG/10ML: INTRAMUSCULAR | Qty: 1 | Status: AC

## 2022-12-29 MED FILL — Fosaprepitant Dimeglumine For IV Infusion 150 MG (Base Eq): INTRAVENOUS | Qty: 5 | Status: AC

## 2022-12-30 ENCOUNTER — Other Ambulatory Visit: Payer: Self-pay

## 2022-12-30 ENCOUNTER — Inpatient Hospital Stay: Payer: Medicare Other

## 2022-12-30 VITALS — BP 105/74 | HR 84 | Temp 97.6°F | Resp 16 | Wt 238.5 lb

## 2022-12-30 DIAGNOSIS — Z95828 Presence of other vascular implants and grafts: Secondary | ICD-10-CM

## 2022-12-30 DIAGNOSIS — C8102 Nodular lymphocyte predominant Hodgkin lymphoma, intrathoracic lymph nodes: Secondary | ICD-10-CM

## 2022-12-30 DIAGNOSIS — C8192 Hodgkin lymphoma, unspecified, intrathoracic lymph nodes: Secondary | ICD-10-CM

## 2022-12-30 DIAGNOSIS — Z5111 Encounter for antineoplastic chemotherapy: Secondary | ICD-10-CM | POA: Diagnosis not present

## 2022-12-30 LAB — CMP (CANCER CENTER ONLY)
ALT: 30 U/L (ref 0–44)
AST: 21 U/L (ref 15–41)
Albumin: 4.1 g/dL (ref 3.5–5.0)
Alkaline Phosphatase: 102 U/L (ref 38–126)
Anion gap: 5 (ref 5–15)
BUN: 11 mg/dL (ref 6–20)
CO2: 30 mmol/L (ref 22–32)
Calcium: 9.4 mg/dL (ref 8.9–10.3)
Chloride: 105 mmol/L (ref 98–111)
Creatinine: 0.9 mg/dL (ref 0.61–1.24)
GFR, Estimated: >60 mL/min (ref >60)
Glucose, Bld: 105 mg/dL — ABNORMAL HIGH (ref 70–99)
Potassium: 3.7 mmol/L (ref 3.5–5.1)
Sodium: 140 mmol/L (ref 135–145)
Total Bilirubin: 0.2 mg/dL — ABNORMAL LOW (ref 0.3–1.2)
Total Protein: 6.5 g/dL (ref 6.5–8.1)

## 2022-12-30 LAB — CBC WITH DIFFERENTIAL (CANCER CENTER ONLY)
Abs Immature Granulocytes: 0 10*3/uL (ref 0.00–0.07)
Basophils Absolute: 0 10*3/uL (ref 0.0–0.1)
Basophils Relative: 0 %
Eosinophils Absolute: 0.2 10*3/uL (ref 0.0–0.5)
Eosinophils Relative: 3 %
HCT: 36.9 % — ABNORMAL LOW (ref 39.0–52.0)
Hemoglobin: 12 g/dL — ABNORMAL LOW (ref 13.0–17.0)
Lymphocytes Relative: 23 %
Lymphs Abs: 1.2 10*3/uL (ref 0.7–4.0)
MCH: 27.8 pg (ref 26.0–34.0)
MCHC: 32.5 g/dL (ref 30.0–36.0)
MCV: 85.6 fL (ref 80.0–100.0)
Monocytes Absolute: 0.5 10*3/uL (ref 0.1–1.0)
Monocytes Relative: 10 %
Neutro Abs: 3.2 10*3/uL (ref 1.7–7.7)
Neutrophils Relative %: 64 %
Platelet Count: 142 10*3/uL — ABNORMAL LOW (ref 150–400)
RBC: 4.31 MIL/uL (ref 4.22–5.81)
RDW: 14.8 % (ref 11.5–15.5)
Smear Review: NORMAL
WBC Count: 5 10*3/uL (ref 4.0–10.5)
nRBC: 0 % (ref 0.0–0.2)

## 2022-12-30 MED ORDER — DOXORUBICIN HCL CHEMO IV INJECTION 2 MG/ML
25.0000 mg/m2 | Freq: Once | INTRAVENOUS | Status: AC
  Administered 2022-12-30: 56 mg via INTRAVENOUS
  Filled 2022-12-30: qty 28

## 2022-12-30 MED ORDER — SODIUM CHLORIDE 0.9 % IV SOLN: Freq: Once | INTRAVENOUS | Status: AC

## 2022-12-30 MED ORDER — SODIUM CHLORIDE 0.9% FLUSH
10.0000 mL | Freq: Once | INTRAVENOUS | Status: AC
  Administered 2022-12-30: 10 mL

## 2022-12-30 MED ORDER — SODIUM CHLORIDE 0.9 % IV SOLN
375.0000 mg/m2 | Freq: Once | INTRAVENOUS | Status: AC
  Administered 2022-12-30: 800 mg via INTRAVENOUS
  Filled 2022-12-30: qty 80

## 2022-12-30 MED ORDER — SODIUM CHLORIDE 0.9 % IV SOLN
150.0000 mg | Freq: Once | INTRAVENOUS | Status: AC
  Administered 2022-12-30: 150 mg via INTRAVENOUS
  Filled 2022-12-30: qty 150

## 2022-12-30 MED ORDER — PALONOSETRON HCL INJECTION 0.25 MG/5ML
0.2500 mg | Freq: Once | INTRAVENOUS | Status: AC
  Administered 2022-12-30: 0.25 mg via INTRAVENOUS
  Filled 2022-12-30: qty 5

## 2022-12-30 MED ORDER — HEPARIN SOD (PORK) LOCK FLUSH 100 UNIT/ML IV SOLN
500.0000 [IU] | Freq: Once | INTRAVENOUS | Status: AC | PRN
  Administered 2022-12-30: 500 [IU]

## 2022-12-30 MED ORDER — SODIUM CHLORIDE 0.9% FLUSH
10.0000 mL | INTRAVENOUS | Status: DC | PRN
  Administered 2022-12-30: 10 mL

## 2022-12-30 MED ORDER — SODIUM CHLORIDE 0.9 % IV SOLN
10.0000 mg | Freq: Once | INTRAVENOUS | Status: AC
  Administered 2022-12-30: 10 mg via INTRAVENOUS
  Filled 2022-12-30: qty 10

## 2022-12-30 MED ORDER — VINBLASTINE SULFATE CHEMO INJECTION 1 MG/ML
6.0000 mg/m2 | Freq: Once | INTRAVENOUS | Status: AC
  Administered 2022-12-30: 13.6 mg via INTRAVENOUS
  Filled 2022-12-30: qty 13.6

## 2022-12-30 NOTE — Patient Instructions (Signed)
[facility]  Discharge Instructions: Thank you for choosing [facility] to provide your oncology and hematology care.   If you have a lab appointment with the Cancer Center, please go directly to the Cancer Center and check in at the registration area.   Wear comfortable clothing and clothing appropriate for easy access to any Portacath or PICC line.   We strive to give you quality time with your provider. You may need to reschedule your appointment if you arrive late (15 or more minutes).  Arriving late affects you and other patients whose appointments are after yours.  Also, if you miss three or more appointments without notifying the office, you may be dismissed from the clinic at the provider's discretion.      For prescription refill requests, have your pharmacy contact our office and allow 72 hours for refills to be completed.    Today you received the following chemotherapy and/or immunotherapy agents: Doxorubicin, Vinblastine, & Dacarbazine       To help prevent nausea and vomiting after your treatment, we encourage you to take your nausea medication as directed.  BELOW ARE SYMPTOMS THAT SHOULD BE REPORTED IMMEDIATELY: *FEVER GREATER THAN 100.4 F (38 C) OR HIGHER *CHILLS OR SWEATING *NAUSEA AND VOMITING THAT IS NOT CONTROLLED WITH YOUR NAUSEA MEDICATION *UNUSUAL SHORTNESS OF BREATH *UNUSUAL BRUISING OR BLEEDING *URINARY PROBLEMS (pain or burning when urinating, or frequent urination) *BOWEL PROBLEMS (unusual diarrhea, constipation, pain near the anus) TENDERNESS IN MOUTH AND THROAT WITH OR WITHOUT PRESENCE OF ULCERS (sore throat, sores in mouth, or a toothache) UNUSUAL RASH, SWELLING OR PAIN  UNUSUAL VAGINAL DISCHARGE OR ITCHING   Items with * indicate a potential emergency and should be followed up as soon as possible or go to the Emergency Department if any problems should occur.  Please show the CHEMOTHERAPY ALERT CARD or  IMMUNOTHERAPY ALERT CARD at check-in to the Emergency Department and triage nurse.  Should you have questions after your visit or need to cancel or reschedule your appointment, please contact [facility]  Dept: [phone]  and follow the prompts.  Office hours are 8:00 a.m. to 4:30 p.m. Monday - Friday. Please note that voicemails left after 4:00 p.m. may not be returned until the following business day.  We are closed weekends and major holidays. You have access to a nurse at all times for urgent questions. Please call the main number to the clinic Dept: [phone] and follow the prompts.   For any non-urgent questions, you may also contact your provider using MyChart. We now offer e-Visits for anyone 18 and older to request care online for non-urgent symptoms. For details visit [email or web address].   Also download the MyChart app! Go to the app store, search "MyChart", open the app, select [facility], and log in with your MyChart username and password. 

## 2022-12-31 ENCOUNTER — Inpatient Hospital Stay: Payer: Medicare Other

## 2022-12-31 ENCOUNTER — Encounter: Payer: Self-pay | Admitting: Hematology

## 2022-12-31 ENCOUNTER — Other Ambulatory Visit: Payer: Self-pay

## 2022-12-31 VITALS — BP 106/71 | HR 71 | Temp 97.8°F | Resp 18

## 2022-12-31 DIAGNOSIS — C8192 Hodgkin lymphoma, unspecified, intrathoracic lymph nodes: Secondary | ICD-10-CM

## 2022-12-31 DIAGNOSIS — Z5111 Encounter for antineoplastic chemotherapy: Secondary | ICD-10-CM | POA: Diagnosis not present

## 2022-12-31 MED ORDER — FILGRASTIM-SNDZ 300 MCG/0.5ML IJ SOSY
300.0000 ug | PREFILLED_SYRINGE | Freq: Once | INTRAMUSCULAR | Status: AC
  Administered 2022-12-31: 300 ug via SUBCUTANEOUS
  Filled 2022-12-31: qty 0.5

## 2023-01-01 ENCOUNTER — Inpatient Hospital Stay: Payer: Medicare Other

## 2023-01-01 VITALS — BP 100/70 | HR 75 | Temp 97.9°F | Resp 20

## 2023-01-01 DIAGNOSIS — Z5111 Encounter for antineoplastic chemotherapy: Secondary | ICD-10-CM | POA: Diagnosis not present

## 2023-01-01 DIAGNOSIS — C8192 Hodgkin lymphoma, unspecified, intrathoracic lymph nodes: Secondary | ICD-10-CM

## 2023-01-01 MED ORDER — FILGRASTIM-SNDZ 300 MCG/0.5ML IJ SOSY
300.0000 ug | PREFILLED_SYRINGE | Freq: Once | INTRAMUSCULAR | Status: AC
  Administered 2023-01-01: 300 ug via SUBCUTANEOUS
  Filled 2023-01-01: qty 0.5

## 2023-01-02 ENCOUNTER — Inpatient Hospital Stay: Payer: Medicare Other

## 2023-01-02 VITALS — BP 101/80 | HR 85 | Temp 98.1°F | Resp 20

## 2023-01-02 DIAGNOSIS — C8192 Hodgkin lymphoma, unspecified, intrathoracic lymph nodes: Secondary | ICD-10-CM

## 2023-01-02 DIAGNOSIS — Z5111 Encounter for antineoplastic chemotherapy: Secondary | ICD-10-CM | POA: Diagnosis not present

## 2023-01-02 MED ORDER — FILGRASTIM-SNDZ 300 MCG/0.5ML IJ SOSY
300.0000 ug | PREFILLED_SYRINGE | Freq: Once | INTRAMUSCULAR | Status: AC
  Administered 2023-01-02: 300 ug via SUBCUTANEOUS

## 2023-01-08 ENCOUNTER — Other Ambulatory Visit: Payer: Medicare Other

## 2023-01-08 ENCOUNTER — Ambulatory Visit: Payer: Medicare Other | Admitting: Hematology

## 2023-01-08 ENCOUNTER — Ambulatory Visit: Payer: Medicare Other

## 2023-01-12 MED FILL — Fosaprepitant Dimeglumine For IV Infusion 150 MG (Base Eq): INTRAVENOUS | Qty: 5 | Status: AC

## 2023-01-12 MED FILL — Dexamethasone Sodium Phosphate Inj 100 MG/10ML: INTRAMUSCULAR | Qty: 1 | Status: AC

## 2023-01-13 ENCOUNTER — Inpatient Hospital Stay: Payer: Medicare Other

## 2023-01-13 ENCOUNTER — Other Ambulatory Visit: Payer: Self-pay

## 2023-01-13 ENCOUNTER — Inpatient Hospital Stay: Payer: Medicare Other | Attending: Hematology

## 2023-01-13 DIAGNOSIS — Z5189 Encounter for other specified aftercare: Secondary | ICD-10-CM | POA: Diagnosis not present

## 2023-01-13 DIAGNOSIS — Z95828 Presence of other vascular implants and grafts: Secondary | ICD-10-CM

## 2023-01-13 DIAGNOSIS — Z5111 Encounter for antineoplastic chemotherapy: Secondary | ICD-10-CM | POA: Diagnosis present

## 2023-01-13 DIAGNOSIS — C8192 Hodgkin lymphoma, unspecified, intrathoracic lymph nodes: Secondary | ICD-10-CM

## 2023-01-13 DIAGNOSIS — C8179 Other classical Hodgkin lymphoma, extranodal and solid organ sites: Secondary | ICD-10-CM | POA: Diagnosis not present

## 2023-01-13 DIAGNOSIS — C8102 Nodular lymphocyte predominant Hodgkin lymphoma, intrathoracic lymph nodes: Secondary | ICD-10-CM

## 2023-01-13 LAB — CBC WITH DIFFERENTIAL (CANCER CENTER ONLY)
Abs Immature Granulocytes: 0.02 10*3/uL (ref 0.00–0.07)
Basophils Absolute: 0 10*3/uL (ref 0.0–0.1)
Basophils Relative: 2 %
Eosinophils Absolute: 0.1 10*3/uL (ref 0.0–0.5)
Eosinophils Relative: 2 %
HCT: 34.8 % — ABNORMAL LOW (ref 39.0–52.0)
Hemoglobin: 11.5 g/dL — ABNORMAL LOW (ref 13.0–17.0)
Immature Granulocytes: 1 %
Lymphocytes Relative: 43 %
Lymphs Abs: 0.9 10*3/uL (ref 0.7–4.0)
MCH: 28 pg (ref 26.0–34.0)
MCHC: 33 g/dL (ref 30.0–36.0)
MCV: 84.7 fL (ref 80.0–100.0)
Monocytes Absolute: 0.8 10*3/uL (ref 0.1–1.0)
Monocytes Relative: 38 %
Neutro Abs: 0.3 10*3/uL — CL (ref 1.7–7.7)
Neutrophils Relative %: 14 %
Platelet Count: 211 10*3/uL (ref 150–400)
RBC: 4.11 MIL/uL — ABNORMAL LOW (ref 4.22–5.81)
RDW: 14.9 % (ref 11.5–15.5)
Smear Review: NORMAL
WBC Count: 2.1 10*3/uL — ABNORMAL LOW (ref 4.0–10.5)
nRBC: 0 % (ref 0.0–0.2)

## 2023-01-13 LAB — CMP (CANCER CENTER ONLY)
ALT: 22 U/L (ref 0–44)
AST: 21 U/L (ref 15–41)
Albumin: 3.9 g/dL (ref 3.5–5.0)
Alkaline Phosphatase: 96 U/L (ref 38–126)
Anion gap: 4 — ABNORMAL LOW (ref 5–15)
BUN: 10 mg/dL (ref 6–20)
CO2: 31 mmol/L (ref 22–32)
Calcium: 8.8 mg/dL — ABNORMAL LOW (ref 8.9–10.3)
Chloride: 104 mmol/L (ref 98–111)
Creatinine: 0.83 mg/dL (ref 0.61–1.24)
GFR, Estimated: >60 mL/min (ref >60)
Glucose, Bld: 116 mg/dL — ABNORMAL HIGH (ref 70–99)
Potassium: 3.5 mmol/L (ref 3.5–5.1)
Sodium: 139 mmol/L (ref 135–145)
Total Bilirubin: 0.2 mg/dL — ABNORMAL LOW (ref 0.3–1.2)
Total Protein: 6.1 g/dL — ABNORMAL LOW (ref 6.5–8.1)

## 2023-01-13 MED ORDER — SODIUM CHLORIDE 0.9% FLUSH
10.0000 mL | Freq: Once | INTRAVENOUS | Status: AC
  Administered 2023-01-13: 10 mL

## 2023-01-14 ENCOUNTER — Inpatient Hospital Stay: Payer: Medicare Other

## 2023-01-14 ENCOUNTER — Other Ambulatory Visit: Payer: Self-pay | Admitting: Hematology

## 2023-01-14 VITALS — BP 113/87 | HR 91 | Temp 98.0°F | Resp 16

## 2023-01-14 DIAGNOSIS — Z5111 Encounter for antineoplastic chemotherapy: Secondary | ICD-10-CM | POA: Diagnosis not present

## 2023-01-14 DIAGNOSIS — C8192 Hodgkin lymphoma, unspecified, intrathoracic lymph nodes: Secondary | ICD-10-CM

## 2023-01-14 MED ORDER — FILGRASTIM-SNDZ 300 MCG/0.5ML IJ SOSY
300.0000 ug | PREFILLED_SYRINGE | Freq: Once | INTRAMUSCULAR | Status: AC
  Administered 2023-01-14: 300 ug via SUBCUTANEOUS
  Filled 2023-01-14: qty 0.5

## 2023-01-14 NOTE — Patient Instructions (Signed)
Filgrastim Injection What is this medication? FILGRASTIM (fil GRA stim) lowers the risk of infection in people who are receiving chemotherapy. It works by helping your body make more white blood cells, which protects your body from infection. It may also be used to help people who have been exposed to high doses of radiation. It can be used to help prepare your body before a stem cell transplant. It works by helping your bone marrow make and release stem cells into the blood. This medicine may be used for other purposes; ask your health care provider or pharmacist if you have questions. COMMON BRAND NAME(S): Neupogen, Nivestym, Releuko, Zarxio What should I tell my care team before I take this medication? They need to know if you have any of these conditions: History of blood diseases, such as sickle cell anemia Kidney disease Recent or ongoing radiation An unusual or allergic reaction to filgrastim, pegfilgrastim, latex, rubber, other medications, foods, dyes, or preservatives Pregnant or trying to get pregnant Breast-feeding How should I use this medication? This medication is injected under the skin or into a vein. It is usually given by your care team in a hospital or clinic setting. It may be given at home. If you get this medication at home, you will be taught how to prepare and give it. Use exactly as directed. Take it as directed on the prescription label at the same time every day. Keep taking it unless your care team tells you to stop. It is important that you put your used needles and syringes in a special sharps container. Do not put them in a trash can. If you do not have a sharps container, call your pharmacist or care team to get one. This medication comes with INSTRUCTIONS FOR USE. Ask your pharmacist for directions on how to use this medication. Read the information carefully. Talk to your pharmacist or care team if you have questions. Talk to your care team about the use of this  medication in children. While it may be prescribed for children for selected conditions, precautions do apply. Overdosage: If you think you have taken too much of this medicine contact a poison control center or emergency room at once. NOTE: This medicine is only for you. Do not share this medicine with others. What if I miss a dose? It is important not to miss any doses. Talk to your care team about what to do if you miss a dose. What may interact with this medication? Medications that may cause a release of neutrophils, such as lithium This list may not describe all possible interactions. Give your health care provider a list of all the medicines, herbs, non-prescription drugs, or dietary supplements you use. Also tell them if you smoke, drink alcohol, or use illegal drugs. Some items may interact with your medicine. What should I watch for while using this medication? Your condition will be monitored carefully while you are receiving this medication. You may need bloodwork while taking this medication. Talk to your care team about your risk of cancer. You may be more at risk for certain types of cancer if you take this medication. What side effects may I notice from receiving this medication? Side effects that you should report to your care team as soon as possible: Allergic reactions--skin rash, itching, hives, swelling of the face, lips, tongue, or throat Capillary leak syndrome--stomach or muscle pain, unusual weakness or fatigue, feeling faint or lightheaded, decrease in the amount of urine, swelling of the ankles, hands, or   feet, trouble breathing High white blood cell level--fever, fatigue, trouble breathing, night sweats, change in vision, weight loss Inflammation of the aorta--fever, fatigue, back, chest, or stomach pain, severe headache Kidney injury (glomerulonephritis)--decrease in the amount of urine, red or dark brown urine, foamy or bubbly urine, swelling of the ankles, hands, or  feet Shortness of breath or trouble breathing Spleen injury--pain in upper left stomach or shoulder Unusual bruising or bleeding Side effects that usually do not require medical attention (report to your care team if they continue or are bothersome): Back pain Bone pain Fatigue Fever Headache Nausea This list may not describe all possible side effects. Call your doctor for medical advice about side effects. You may report side effects to FDA at 1-800-FDA-1088. Where should I keep my medication? Keep out of the reach of children and pets. Keep this medication in the original packaging until you are ready to take it. Protect from light. See product for storage information. Each product may have different instructions. Get rid of any unused medication after the expiration date. To get rid of medications that are no longer needed or have expired: Take the medication to a medications take-back program. Check with your pharmacy or law enforcement to find a location. If you cannot return the medication, ask your pharmacist or care team how to get rid of this medication safely. NOTE: This sheet is a summary. It may not cover all possible information. If you have questions about this medicine, talk to your doctor, pharmacist, or health care provider.  2023 Elsevier/Gold Standard (2021-12-02 00:00:00)  

## 2023-01-15 ENCOUNTER — Inpatient Hospital Stay: Payer: Medicare Other

## 2023-01-15 ENCOUNTER — Ambulatory Visit: Payer: Medicare Other

## 2023-01-15 ENCOUNTER — Inpatient Hospital Stay (HOSPITAL_BASED_OUTPATIENT_CLINIC_OR_DEPARTMENT_OTHER): Payer: Medicare Other | Admitting: Hematology

## 2023-01-15 ENCOUNTER — Other Ambulatory Visit: Payer: Self-pay

## 2023-01-15 ENCOUNTER — Other Ambulatory Visit: Payer: Medicare Other

## 2023-01-15 ENCOUNTER — Other Ambulatory Visit: Payer: Self-pay | Admitting: *Deleted

## 2023-01-15 DIAGNOSIS — C8192 Hodgkin lymphoma, unspecified, intrathoracic lymph nodes: Secondary | ICD-10-CM

## 2023-01-15 DIAGNOSIS — C8102 Nodular lymphocyte predominant Hodgkin lymphoma, intrathoracic lymph nodes: Secondary | ICD-10-CM

## 2023-01-15 DIAGNOSIS — Z5111 Encounter for antineoplastic chemotherapy: Secondary | ICD-10-CM | POA: Diagnosis not present

## 2023-01-15 DIAGNOSIS — Z95828 Presence of other vascular implants and grafts: Secondary | ICD-10-CM

## 2023-01-15 LAB — CBC WITH DIFFERENTIAL (CANCER CENTER ONLY)
Abs Immature Granulocytes: 0.17 10*3/uL — ABNORMAL HIGH (ref 0.00–0.07)
Basophils Absolute: 0.1 10*3/uL (ref 0.0–0.1)
Basophils Relative: 1 %
Eosinophils Absolute: 0.1 10*3/uL (ref 0.0–0.5)
Eosinophils Relative: 1 %
HCT: 35 % — ABNORMAL LOW (ref 39.0–52.0)
Hemoglobin: 11.8 g/dL — ABNORMAL LOW (ref 13.0–17.0)
Immature Granulocytes: 2 %
Lymphocytes Relative: 24 %
Lymphs Abs: 2.6 10*3/uL (ref 0.7–4.0)
MCH: 28.2 pg (ref 26.0–34.0)
MCHC: 33.7 g/dL (ref 30.0–36.0)
MCV: 83.7 fL (ref 80.0–100.0)
Monocytes Absolute: 2.4 10*3/uL — ABNORMAL HIGH (ref 0.1–1.0)
Monocytes Relative: 22 %
Neutro Abs: 5.6 10*3/uL (ref 1.7–7.7)
Neutrophils Relative %: 50 %
Platelet Count: 236 10*3/uL (ref 150–400)
RBC: 4.18 MIL/uL — ABNORMAL LOW (ref 4.22–5.81)
RDW: 14.9 % (ref 11.5–15.5)
Smear Review: NORMAL
WBC Count: 10.9 10*3/uL — ABNORMAL HIGH (ref 4.0–10.5)
nRBC: 0.4 % — ABNORMAL HIGH (ref 0.0–0.2)

## 2023-01-15 LAB — CMP (CANCER CENTER ONLY)
ALT: 25 U/L (ref 0–44)
AST: 23 U/L (ref 15–41)
Albumin: 4 g/dL (ref 3.5–5.0)
Alkaline Phosphatase: 101 U/L (ref 38–126)
Anion gap: 6 (ref 5–15)
BUN: 10 mg/dL (ref 6–20)
CO2: 29 mmol/L (ref 22–32)
Calcium: 9 mg/dL (ref 8.9–10.3)
Chloride: 103 mmol/L (ref 98–111)
Creatinine: 0.9 mg/dL (ref 0.61–1.24)
GFR, Estimated: >60 mL/min (ref >60)
Glucose, Bld: 89 mg/dL (ref 70–99)
Potassium: 3.6 mmol/L (ref 3.5–5.1)
Sodium: 138 mmol/L (ref 135–145)
Total Bilirubin: 0.3 mg/dL (ref 0.3–1.2)
Total Protein: 6.5 g/dL (ref 6.5–8.1)

## 2023-01-15 MED ORDER — SODIUM CHLORIDE 0.9% FLUSH
10.0000 mL | Freq: Once | INTRAVENOUS | Status: AC
  Administered 2023-01-15: 10 mL

## 2023-01-15 MED ORDER — HEPARIN SOD (PORK) LOCK FLUSH 100 UNIT/ML IV SOLN
500.0000 [IU] | Freq: Once | INTRAVENOUS | Status: AC
  Administered 2023-01-15: 500 [IU]

## 2023-01-15 MED ORDER — LIDOCAINE-PRILOCAINE 2.5-2.5 % EX CREA
TOPICAL_CREAM | CUTANEOUS | 3 refills | Status: DC
Start: 2023-01-15 — End: 2023-03-15

## 2023-01-15 NOTE — Progress Notes (Signed)
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 male who has been referred to [place] by .Lazoff, Shawn P, DO and Dr. Clydell Hakim MD for evaluation and management of newly diagnosed mediastinal mass concerning for likely lymphoma.  Patient has a history of fetal alcohol syndrome with developmental delay, narcolepsy, ADD, exercise-induced asthma and lives with his adoptive parents. He recently presented to the hospital on 07/01/2022 with 2-week history of worsening shortness of breath cough and wheezing.  He received his asthma treatment but was still noted to have significant cough.  In the emergency room he had a chest x-ray which showed an mediastinal mass. Subsequent CT chest with contrast on 07/01/2022 showed extensive mediastinal and hilar lymphadenopathy with conglomerate soft tissue mass/adenopathy within the anterior mediastinum measuring 11.2 x 7.9 x 13 cm. Compression of the left brachiocephalic and SVC within the mid though these vascular structures remain patent.  Patient did not have any clinical signs or symptoms of SVC compression syndrome or left upper extremity swelling.  Patient subsequently had a CT of the abdomen and pelvis which showed no acute intra-abdominal or intrapelvic abnormalities. He underwent a CT-guided core needle biopsy of the mediastinal mass by interventional radiology.  The official pathology results from his biopsy are not currently available at the time of this clinic visit. I did call and talk to the pathologist Dr. Independence Callas and she notes that this either looks like a lymphoma or thymoma and she is running additional tests to make a final  diagnosis.  Patient's father accompanied him for this visit and his mother who helps make most of the decisions in tandem with his father was present on the phone.  They do not note any unexplained fevers chills night sweats or significant weight loss.  His breathing has been relatively stable since hospital discharge but he is still having some cough.  I confirmed adequacy of tissue sampling with the pathologist and the patient was then started on prednisone to try to help his cough by drinking his mediastinal tumor some and reducing bronchial inflammation.  Patient is unable to provide much information or review of systems on account of his developmental delay issues.  Interval History:   Paul [name] is a wonderful 32 y.o. male who is here for continued evaluation and management of mediastinal mass concerning for likely lymphoma.   Patient was last seen by me on 12/25/2022 and reported vomiting, but was doing otherwise well overall with no new medical concerns.   Today, he is accompanied by his father. He reports that he has been feeling well overall. He reports normal p.o. intake and denies any abdominal pain, mouth sores, fever, chills, diarrhea, new skin rashes, or other new concerns.   MEDICAL HISTORY:  Past Medical History:  Diagnosis Date   ADD (attention deficit disorder)    ADHD (attention deficit hyperactivity disorder)    Asthma    excercise induced and pollen   Auditory processing disorder    Complication of anesthesia    mother states pt. is harder to put under anes. and hard to wake up due to fetal alcohol syndrome; can be combative, per mother; states he will do better if mother is in PACU when he  is coming out of anes.   Development delay    states mental status of a 32 year old   Exercise-induced asthma    prn inhaler/neb.   Fetal alcohol syndrome    Narcolepsy    Non-restorable tooth 09/2015   teeth   Separation anxiety    Twin birth     SURGICAL  HISTORY: Past Surgical History:  Procedure Laterality Date   IR IMAGING GUIDED PORT INSERTION  08/11/2022   MULTIPLE EXTRACTIONS WITH ALVEOLOPLASTY N/A 09/23/2015   Procedure: MULTIPLE EXTRACTION WITH ALVEOLOPLASTY;  Surgeon: Ocie Doyne, DDS;  Location: [facility];  Service: Oral Surgery;  Laterality: N/A;   PYLOROMYOTOMY     age [age], performed found to not have stenosis on surgical exam   PYLOROMYOTOMY     did not have pyloric stenosis; did surgery on the wrong twin   RADIOLOGY WITH ANESTHESIA N/A 01/16/2020   Procedure: MRI WITH ANESTHESIA  BRAIN WITH AND WITHOUT CONTRAST;  Surgeon: Radiologist, Medication, MD;  Location: [facility] OR;  Service: Radiology;  Laterality: N/A;    SOCIAL HISTORY: Social History   Socioeconomic History   Marital status: Single    Spouse name: Not on file   Number of children: Not on file   Years of education: Not on file   Highest education level: Not on file  Occupational History   Not on file  Tobacco Use   Smoking status: Never   Smokeless tobacco: Never  Vaping Use   Vaping Use: Never used  Substance and Sexual Activity   Alcohol use: No   Drug use: No   Sexual activity: Not on file  Other Topics Concern   Not on file  Social History Narrative   ** Merged History Encounter **       Social Determinants of Health   Financial Resource Strain: Not on file  Food Insecurity: Not on file  Transportation Needs: Not on file  Physical Activity: Not on file  Stress: Not on file  Social Connections: Not on file  Intimate Partner Violence: Not on file    FAMILY HISTORY: Family History  Adopted: Yes  Problem Relation Age of Onset   Mental illness Mother    Diabetes Mother    Hypertension Mother    Schizophrenia Mother    Bipolar disorder Mother    Mental retardation Father    Sleep apnea Neg Hx     ALLERGIES:  is allergic to other.  MEDICATIONS:  Current Outpatient Medications  Medication Sig Dispense Refill    albuterol (PROVENTIL HFA;VENTOLIN HFA) 108 (90 BASE) MCG/ACT inhaler Inhale into the lungs every 6 (six) hours as needed for wheezing or shortness of breath.     albuterol (PROVENTIL) (5 MG/ML) 0.5% nebulizer solution Take 2.5 mg by nebulization every 6 (six) hours as needed for wheezing or shortness of breath.     Cholecalciferol (VITAMIN D3) 250 MCG (10000 UT) capsule Take 10,000 Units by mouth daily.     dexamethasone (DECADRON) 4 MG tablet Take 2 tablets by mouth once a day for 3 days after chemotherapy. Take with food. 30 tablet 1   lidocaine-prilocaine (EMLA) cream Apply to affected area once 30 g 3   loratadine (CLARITIN) 10 MG tablet Take 10 mg by mouth daily.     mometasone-formoterol (DULERA) 200-5 MCG/ACT AERO Inhale 2 puffs into the lungs 2 (two) times daily. 8.8 g 0   Multiple Vitamin (MULTIVITAMIN WITH MINERALS) TABS tablet Take 1 tablet by mouth daily.     ondansetron (  ZOFRAN) 4 MG tablet Take 1 tablet (4 mg total) by mouth every 6 (six) hours as needed for nausea. 20 tablet 0   prochlorperazine (COMPAZINE) 10 MG tablet Take 1 tablet (10 mg total) by mouth every 6 (six) hours as needed for nausea or vomiting. 30 tablet 1   triamcinolone (KENALOG) 0.025 % ointment Apply 1 Application topically 2 (two) times daily. To rash on hands 30 g 0   urea (CARMOL) 10 % cream Apply topically 2 (two) times daily. To hands and feet 453 g 1   No current facility-administered medications for this visit.    REVIEW OF SYSTEMS:    10 Point review of Systems was done is negative except as noted above.   PHYSICAL EXAMINATION  There were no vitals taken for this visit.  GENERAL:alert, in no acute distress and comfortable SKIN: no acute rashes, no significant lesions EYES: conjunctiva are pink and non-injected, sclera anicteric OROPHARYNX: MMM, no exudates, no oropharyngeal erythema or ulceration NECK: supple, no JVD LYMPH:  no palpable lymphadenopathy in the cervical, axillary or inguinal  regions LUNGS: clear to auscultation b/l with normal respiratory effort HEART: regular rate & rhythm ABDOMEN:  normoactive bowel sounds , non tender, not distended. Extremity: no pedal edema PSYCH: alert & oriented x 3 with fluent speech NEURO: no focal motor/sensory deficits   LABORATORY DATA:  I have reviewed the data as listed  .    Latest Ref Rng & Units 01/13/2023    8:15 AM 12/30/2022    7:58 AM 12/25/2022   11:10 AM  CBC  WBC 4.0 - 10.5 K/uL 2.1  5.0  2.2   Hemoglobin 13.0 - 17.0 g/dL 16.1  09.6  04.5   Hematocrit 39.0 - 52.0 % 34.8  36.9  35.7   Platelets 150 - 400 K/uL 211  142  264    ANC 300  .    Latest Ref Rng & Units 01/13/2023    8:15 AM 12/30/2022    7:58 AM 12/25/2022   11:10 AM  CMP  Glucose 70 - 99 mg/dL 409  811  99   BUN 6 - 20 mg/dL 10  11  12    Creatinine 0.61 - 1.24 mg/dL 9.14  7.82  9.56   Sodium 135 - 145 mmol/L 139  140  137   Potassium 3.5 - 5.1 mmol/L 3.5  3.7  3.8   Chloride 98 - 111 mmol/L 104  105  102   CO2 22 - 32 mmol/L 31  30  30    Calcium 8.9 - 10.3 mg/dL 8.8  9.4  9.2   Total Protein 6.5 - 8.1 g/dL 6.1  6.5  6.4   Total Bilirubin 0.3 - 1.2 mg/dL 0.2  0.2  0.2   Alkaline Phos 38 - 126 U/L 96  102  93   AST 15 - 41 U/L 21  21  17    ALT 0 - 44 U/L 22  30  24     . Lab Results  Component Value Date   LDH 171 07/03/2022   RADIOGRAPHIC STUDIES: I have personally reviewed the radiological images as listed and agreed with the findings in the report. No results found.  Surgical Pathology result from 07/03/2022: FINAL MICROSCOPIC DIAGNOSIS:  A. MEDIASTINAL MASS, ANTERIOR, NEEDLE CORE BIOPSY: -Atypical lymphoid proliferation consistent with classical Hodgkin lymphoma -See comment  COMMENT:  The sections show needle core biopsy fragments displaying a nodular and diffuse lymphoid proliferation associated with dense sclerosis.  The lymphoid process shows  predominance of small lymphoid cells admixed to a lesser extent with histiocytes,  eosinophils in addition to the large atypical mononuclear and occasionally lobated lymphoid appearing cells with variably prominent nucleoli.  Flow cytometric analysis was performed [facility] [phone]) and shows predominance of CD4-positive T cells. No monoclonal B-cell population identified.  In addition, a battery of immunohistochemical stains was performed including CD30, CD15, mum 1, LCA, CD20, PAX5, CD3, CD5, CD4, CD8, TdT, cytokeratin AE1/AE3, cytokeratin 8/18 and EBV in situ hybridization with appropriate controls.  The large atypical lymphoid appearing cells are positive for CD15, CD30, PAX5, mum 1 and rare cells for CD20.  No significant staining is seen with LCA, EBV, CD3, CD5, CD4, CD8.  Cytokeratin stains highlight a small focus of positivity likely representing native epithelial elements.  No significant TdT positivity is identified.  The small lymphocytes in the background show a mixture of T and B cells with predominance of T cells.  The latter show predominance of CD4 positive cells.  Overall, the features are atypical and most consistent with classical Hodgkin lymphoma which is best subclassified as nodular sclerosis type.   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 The mass was causing some compression of his SVC and left brachiocephalic vein but no complete obstruction or symptoms related to this. No constitutional symptoms.  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  PLAN:  -Discussed lab results on 01/15/2023 in detail with patient. CBC showed WBC of 10.9K, hemoglobin of 11.8, and platelets of 236K. -CBC differential pending -Patient will not require growth factor  injections tomorrow due to WBC improving -proceed with next cycle of chemotherapy treatment -Discontinued bleomycin since cycle 4 of treatment -Continue B-complex supplement -Answered all of patient's father's questions regarding treatment.   FOLLOW-UP: Cancel G-CSF injection for 5/10 and 5/11 Plz switch C6D1 of AVD to 5/13 (from 5/15) if possible and adjust other treatment appointments accordingly. MD visit with [id number].  The total time spent in the appointment was *** minutes* .  All of the patient's questions were answered with apparent satisfaction. The patient knows to call the clinic with any problems, questions or concerns.   Wyvonnia Lora MD MS AAHIVMS [facility] Hematology/[facility]  .*Total Encounter Time as defined by the Centers for Medicare and Medicaid Services includes, in addition to the face-to-face time of a patient visit (documented in the note above) non-face-to-face time: obtaining and reviewing outside history, ordering and reviewing medications, tests or procedures, care coordination (communications with other health care professionals or caregivers) and documentation in the medical record.    I,Mitra Faeizi,acting as a Neurosurgeon for Wyvonnia Lora, MD.,have documented all relevant documentation on the behalf of Wyvonnia Lora, MD,as directed by  Wyvonnia Lora, MD while in the presence of Wyvonnia Lora, MD.  ***

## 2023-01-16 ENCOUNTER — Inpatient Hospital Stay: Payer: Medicare Other

## 2023-01-18 ENCOUNTER — Telehealth: Payer: Self-pay | Admitting: Hematology

## 2023-01-19 ENCOUNTER — Telehealth: Payer: Self-pay | Admitting: Hematology

## 2023-01-19 ENCOUNTER — Other Ambulatory Visit: Payer: Self-pay

## 2023-01-19 MED FILL — Fosaprepitant Dimeglumine For IV Infusion 150 MG (Base Eq): INTRAVENOUS | Qty: 5 | Status: AC

## 2023-01-19 MED FILL — Dexamethasone Sodium Phosphate Inj 100 MG/10ML: INTRAMUSCULAR | Qty: 1 | Status: AC

## 2023-01-20 ENCOUNTER — Inpatient Hospital Stay: Payer: Medicare Other

## 2023-01-20 VITALS — BP 109/79 | HR 82 | Temp 97.8°F | Resp 15

## 2023-01-20 DIAGNOSIS — C8102 Nodular lymphocyte predominant Hodgkin lymphoma, intrathoracic lymph nodes: Secondary | ICD-10-CM

## 2023-01-20 DIAGNOSIS — C8192 Hodgkin lymphoma, unspecified, intrathoracic lymph nodes: Secondary | ICD-10-CM

## 2023-01-20 DIAGNOSIS — Z5111 Encounter for antineoplastic chemotherapy: Secondary | ICD-10-CM | POA: Diagnosis not present

## 2023-01-20 DIAGNOSIS — Z95828 Presence of other vascular implants and grafts: Secondary | ICD-10-CM

## 2023-01-20 LAB — CMP (CANCER CENTER ONLY)
ALT: 28 U/L (ref 0–44)
AST: 20 U/L (ref 15–41)
Albumin: 4.1 g/dL (ref 3.5–5.0)
Alkaline Phosphatase: 96 U/L (ref 38–126)
Anion gap: 5 (ref 5–15)
BUN: 13 mg/dL (ref 6–20)
CO2: 29 mmol/L (ref 22–32)
Calcium: 8.9 mg/dL (ref 8.9–10.3)
Chloride: 104 mmol/L (ref 98–111)
Creatinine: 0.89 mg/dL (ref 0.61–1.24)
GFR, Estimated: >60 mL/min (ref >60)
Glucose, Bld: 111 mg/dL — ABNORMAL HIGH (ref 70–99)
Potassium: 3.9 mmol/L (ref 3.5–5.1)
Sodium: 138 mmol/L (ref 135–145)
Total Bilirubin: 0.2 mg/dL — ABNORMAL LOW (ref 0.3–1.2)
Total Protein: 6.5 g/dL (ref 6.5–8.1)

## 2023-01-20 LAB — CBC WITH DIFFERENTIAL (CANCER CENTER ONLY)
Abs Immature Granulocytes: 0.2 10*3/uL — ABNORMAL HIGH (ref 0.00–0.07)
Basophils Absolute: 0.1 10*3/uL (ref 0.0–0.1)
Basophils Relative: 1 %
Eosinophils Absolute: 0.1 10*3/uL (ref 0.0–0.5)
Eosinophils Relative: 2 %
HCT: 36.9 % — ABNORMAL LOW (ref 39.0–52.0)
Hemoglobin: 12.3 g/dL — ABNORMAL LOW (ref 13.0–17.0)
Immature Granulocytes: 3 %
Lymphocytes Relative: 33 %
Lymphs Abs: 2.4 10*3/uL (ref 0.7–4.0)
MCH: 28.1 pg (ref 26.0–34.0)
MCHC: 33.3 g/dL (ref 30.0–36.0)
MCV: 84.2 fL (ref 80.0–100.0)
Monocytes Absolute: 0.9 10*3/uL (ref 0.1–1.0)
Monocytes Relative: 12 %
Neutro Abs: 3.5 10*3/uL (ref 1.7–7.7)
Neutrophils Relative %: 49 %
Platelet Count: 252 10*3/uL (ref 150–400)
RBC: 4.38 MIL/uL (ref 4.22–5.81)
RDW: 15 % (ref 11.5–15.5)
WBC Count: 7.1 10*3/uL (ref 4.0–10.5)
nRBC: 0 % (ref 0.0–0.2)

## 2023-01-20 MED ORDER — SODIUM CHLORIDE 0.9 % IV SOLN
375.0000 mg/m2 | Freq: Once | INTRAVENOUS | Status: AC
  Administered 2023-01-20: 800 mg via INTRAVENOUS
  Filled 2023-01-20: qty 80

## 2023-01-20 MED ORDER — SODIUM CHLORIDE 0.9 % IV SOLN: Freq: Once | INTRAVENOUS | Status: AC

## 2023-01-20 MED ORDER — PALONOSETRON HCL INJECTION 0.25 MG/5ML
0.2500 mg | Freq: Once | INTRAVENOUS | Status: AC
  Administered 2023-01-20: 0.25 mg via INTRAVENOUS
  Filled 2023-01-20: qty 5

## 2023-01-20 MED ORDER — SODIUM CHLORIDE 0.9% FLUSH
10.0000 mL | Freq: Once | INTRAVENOUS | Status: AC
  Administered 2023-01-20: 10 mL

## 2023-01-20 MED ORDER — SODIUM CHLORIDE 0.9 % IV SOLN
150.0000 mg | Freq: Once | INTRAVENOUS | Status: AC
  Administered 2023-01-20: 150 mg via INTRAVENOUS
  Filled 2023-01-20: qty 5
  Filled 2023-01-20: qty 150

## 2023-01-20 MED ORDER — SODIUM CHLORIDE 0.9% FLUSH
10.0000 mL | INTRAVENOUS | Status: DC | PRN
  Administered 2023-01-20: 10 mL

## 2023-01-20 MED ORDER — HEPARIN SOD (PORK) LOCK FLUSH 100 UNIT/ML IV SOLN
500.0000 [IU] | Freq: Once | INTRAVENOUS | Status: AC | PRN
  Administered 2023-01-20: 500 [IU]

## 2023-01-20 MED ORDER — DOXORUBICIN HCL CHEMO IV INJECTION 2 MG/ML
25.0000 mg/m2 | Freq: Once | INTRAVENOUS | Status: AC
  Administered 2023-01-20: 56 mg via INTRAVENOUS
  Filled 2023-01-20: qty 28

## 2023-01-20 MED ORDER — VINBLASTINE SULFATE CHEMO INJECTION 1 MG/ML
6.0000 mg/m2 | Freq: Once | INTRAVENOUS | Status: AC
  Administered 2023-01-20: 13.6 mg via INTRAVENOUS
  Filled 2023-01-20: qty 13.6

## 2023-01-20 MED ORDER — SODIUM CHLORIDE 0.9 % IV SOLN
10.0000 mg | Freq: Once | INTRAVENOUS | Status: AC
  Administered 2023-01-20: 10 mg via INTRAVENOUS
  Filled 2023-01-20: qty 10
  Filled 2023-01-20: qty 1

## 2023-01-20 NOTE — Patient Instructions (Signed)
[facility]  Discharge Instructions: Thank you for choosing [facility] to provide your oncology and hematology care.   If you have a lab appointment with the Cancer Center, please go directly to the Cancer Center and check in at the registration area.   Wear comfortable clothing and clothing appropriate for easy access to any Portacath or PICC line.   We strive to give you quality time with your provider. You may need to reschedule your appointment if you arrive late (15 or more minutes).  Arriving late affects you and other patients whose appointments are after yours.  Also, if you miss three or more appointments without notifying the office, you may be dismissed from the clinic at the provider's discretion.      For prescription refill requests, have your pharmacy contact our office and allow 72 hours for refills to be completed.    Today you received the following chemotherapy and/or immunotherapy agents: doxorubicin, vinblastine, dacarbazine      To help prevent nausea and vomiting after your treatment, we encourage you to take your nausea medication as directed.  BELOW ARE SYMPTOMS THAT SHOULD BE REPORTED IMMEDIATELY: *FEVER GREATER THAN 100.4 F (38 C) OR HIGHER *CHILLS OR SWEATING *NAUSEA AND VOMITING THAT IS NOT CONTROLLED WITH YOUR NAUSEA MEDICATION *UNUSUAL SHORTNESS OF BREATH *UNUSUAL BRUISING OR BLEEDING *URINARY PROBLEMS (pain or burning when urinating, or frequent urination) *BOWEL PROBLEMS (unusual diarrhea, constipation, pain near the anus) TENDERNESS IN MOUTH AND THROAT WITH OR WITHOUT PRESENCE OF ULCERS (sore throat, sores in mouth, or a toothache) UNUSUAL RASH, SWELLING OR PAIN  UNUSUAL VAGINAL DISCHARGE OR ITCHING   Items with * indicate a potential emergency and should be followed up as soon as possible or go to the Emergency Department if any problems should occur.  Please show the CHEMOTHERAPY ALERT CARD or  IMMUNOTHERAPY ALERT CARD at check-in to the Emergency Department and triage nurse.  Should you have questions after your visit or need to cancel or reschedule your appointment, please contact [facility]  Dept: [phone]  and follow the prompts.  Office hours are 8:00 a.m. to 4:30 p.m. Monday - Friday. Please note that voicemails left after 4:00 p.m. may not be returned until the following business day.  We are closed weekends and major holidays. You have access to a nurse at all times for urgent questions. Please call the main number to the clinic Dept: [phone] and follow the prompts.   For any non-urgent questions, you may also contact your provider using MyChart. We now offer e-Visits for anyone 18 and older to request care online for non-urgent symptoms. For details visit [email or web address].   Also download the MyChart app! Go to the app store, search "MyChart", open the app, select [facility], and log in with your MyChart username and password.   

## 2023-01-21 ENCOUNTER — Other Ambulatory Visit: Payer: Self-pay

## 2023-01-21 ENCOUNTER — Encounter: Payer: Self-pay | Admitting: Hematology

## 2023-01-21 ENCOUNTER — Inpatient Hospital Stay: Payer: Medicare Other

## 2023-01-21 VITALS — BP 98/68 | HR 95 | Temp 97.6°F | Resp 16

## 2023-01-21 DIAGNOSIS — C8192 Hodgkin lymphoma, unspecified, intrathoracic lymph nodes: Secondary | ICD-10-CM

## 2023-01-21 DIAGNOSIS — Z5111 Encounter for antineoplastic chemotherapy: Secondary | ICD-10-CM | POA: Diagnosis not present

## 2023-01-21 MED ORDER — FILGRASTIM-SNDZ 300 MCG/0.5ML IJ SOSY
300.0000 ug | PREFILLED_SYRINGE | Freq: Once | INTRAMUSCULAR | Status: AC
  Administered 2023-01-21: 300 ug via SUBCUTANEOUS
  Filled 2023-01-21: qty 0.5

## 2023-01-21 NOTE — Patient Instructions (Signed)
Filgrastim Injection What is this medication? FILGRASTIM (fil GRA stim) lowers the risk of infection in people who are receiving chemotherapy. It works by helping your body make more white blood cells, which protects your body from infection. It may also be used to help people who have been exposed to high doses of radiation. It can be used to help prepare your body before a stem cell transplant. It works by helping your bone marrow make and release stem cells into the blood. This medicine may be used for other purposes; ask your health care provider or pharmacist if you have questions. COMMON BRAND NAME(S): Neupogen, Nivestym, Releuko, Zarxio What should I tell my care team before I take this medication? They need to know if you have any of these conditions: History of blood diseases, such as sickle cell anemia Kidney disease Recent or ongoing radiation An unusual or allergic reaction to filgrastim, pegfilgrastim, latex, rubber, other medications, foods, dyes, or preservatives Pregnant or trying to get pregnant Breast-feeding How should I use this medication? This medication is injected under the skin or into a vein. It is usually given by your care team in a hospital or clinic setting. It may be given at home. If you get this medication at home, you will be taught how to prepare and give it. Use exactly as directed. Take it as directed on the prescription label at the same time every day. Keep taking it unless your care team tells you to stop. It is important that you put your used needles and syringes in a special sharps container. Do not put them in a trash can. If you do not have a sharps container, call your pharmacist or care team to get one. This medication comes with INSTRUCTIONS FOR USE. Ask your pharmacist for directions on how to use this medication. Read the information carefully. Talk to your pharmacist or care team if you have questions. Talk to your care team about the use of this  medication in children. While it may be prescribed for children for selected conditions, precautions do apply. Overdosage: If you think you have taken too much of this medicine contact a poison control center or emergency room at once. NOTE: This medicine is only for you. Do not share this medicine with others. What if I miss a dose? It is important not to miss any doses. Talk to your care team about what to do if you miss a dose. What may interact with this medication? Medications that may cause a release of neutrophils, such as lithium This list may not describe all possible interactions. Give your health care provider a list of all the medicines, herbs, non-prescription drugs, or dietary supplements you use. Also tell them if you smoke, drink alcohol, or use illegal drugs. Some items may interact with your medicine. What should I watch for while using this medication? Your condition will be monitored carefully while you are receiving this medication. You may need bloodwork while taking this medication. Talk to your care team about your risk of cancer. You may be more at risk for certain types of cancer if you take this medication. What side effects may I notice from receiving this medication? Side effects that you should report to your care team as soon as possible: Allergic reactions--skin rash, itching, hives, swelling of the face, lips, tongue, or throat Capillary leak syndrome--stomach or muscle pain, unusual weakness or fatigue, feeling faint or lightheaded, decrease in the amount of urine, swelling of the ankles, hands, or   feet, trouble breathing High white blood cell level--fever, fatigue, trouble breathing, night sweats, change in vision, weight loss Inflammation of the aorta--fever, fatigue, back, chest, or stomach pain, severe headache Kidney injury (glomerulonephritis)--decrease in the amount of urine, red or dark brown urine, foamy or bubbly urine, swelling of the ankles, hands, or  feet Shortness of breath or trouble breathing Spleen injury--pain in upper left stomach or shoulder Unusual bruising or bleeding Side effects that usually do not require medical attention (report to your care team if they continue or are bothersome): Back pain Bone pain Fatigue Fever Headache Nausea This list may not describe all possible side effects. Call your doctor for medical advice about side effects. You may report side effects to FDA at 1-800-FDA-1088. Where should I keep my medication? Keep out of the reach of children and pets. Keep this medication in the original packaging until you are ready to take it. Protect from light. See product for storage information. Each product may have different instructions. Get rid of any unused medication after the expiration date. To get rid of medications that are no longer needed or have expired: Take the medication to a medications take-back program. Check with your pharmacy or law enforcement to find a location. If you cannot return the medication, ask your pharmacist or care team how to get rid of this medication safely. NOTE: This sheet is a summary. It may not cover all possible information. If you have questions about this medicine, talk to your doctor, pharmacist, or health care provider.  2023 Elsevier/Gold Standard (2021-12-02 00:00:00)  

## 2023-01-22 ENCOUNTER — Ambulatory Visit: Payer: Medicare Other | Admitting: Hematology

## 2023-01-22 ENCOUNTER — Inpatient Hospital Stay: Payer: Medicare Other

## 2023-01-22 ENCOUNTER — Ambulatory Visit: Payer: Medicare Other

## 2023-01-22 ENCOUNTER — Other Ambulatory Visit: Payer: Medicare Other

## 2023-01-22 VITALS — BP 101/59 | HR 87 | Temp 98.1°F | Resp 16

## 2023-01-22 DIAGNOSIS — C8192 Hodgkin lymphoma, unspecified, intrathoracic lymph nodes: Secondary | ICD-10-CM

## 2023-01-22 DIAGNOSIS — Z5111 Encounter for antineoplastic chemotherapy: Secondary | ICD-10-CM | POA: Diagnosis not present

## 2023-01-22 MED ORDER — FILGRASTIM-SNDZ 300 MCG/0.5ML IJ SOSY
300.0000 ug | PREFILLED_SYRINGE | Freq: Once | INTRAMUSCULAR | Status: AC
  Administered 2023-01-22: 300 ug via SUBCUTANEOUS
  Filled 2023-01-22: qty 0.5

## 2023-01-23 ENCOUNTER — Inpatient Hospital Stay: Payer: Medicare Other

## 2023-01-23 VITALS — BP 115/67 | HR 84 | Temp 98.2°F | Resp 20

## 2023-01-23 DIAGNOSIS — C8192 Hodgkin lymphoma, unspecified, intrathoracic lymph nodes: Secondary | ICD-10-CM

## 2023-01-23 DIAGNOSIS — Z5111 Encounter for antineoplastic chemotherapy: Secondary | ICD-10-CM | POA: Diagnosis not present

## 2023-01-23 MED ORDER — FILGRASTIM-SNDZ 300 MCG/0.5ML IJ SOSY
300.0000 ug | PREFILLED_SYRINGE | Freq: Once | INTRAMUSCULAR | Status: AC
  Administered 2023-01-23: 300 ug via SUBCUTANEOUS

## 2023-01-27 ENCOUNTER — Other Ambulatory Visit: Payer: Medicare Other

## 2023-01-27 ENCOUNTER — Ambulatory Visit: Payer: Medicare Other | Admitting: Physician Assistant

## 2023-01-27 ENCOUNTER — Ambulatory Visit: Payer: Medicare Other

## 2023-02-02 MED FILL — Dexamethasone Sodium Phosphate Inj 100 MG/10ML: INTRAMUSCULAR | Qty: 1 | Status: AC

## 2023-02-02 MED FILL — Fosaprepitant Dimeglumine For IV Infusion 150 MG (Base Eq): INTRAVENOUS | Qty: 5 | Status: AC

## 2023-02-03 ENCOUNTER — Other Ambulatory Visit: Payer: Self-pay

## 2023-02-03 ENCOUNTER — Inpatient Hospital Stay (HOSPITAL_BASED_OUTPATIENT_CLINIC_OR_DEPARTMENT_OTHER): Payer: Medicare Other | Admitting: Hematology

## 2023-02-03 ENCOUNTER — Inpatient Hospital Stay: Payer: Medicare Other

## 2023-02-03 VITALS — BP 97/72 | HR 109 | Temp 98.3°F | Resp 19 | Ht 70.0 in | Wt 241.0 lb

## 2023-02-03 VITALS — HR 97

## 2023-02-03 DIAGNOSIS — C8192 Hodgkin lymphoma, unspecified, intrathoracic lymph nodes: Secondary | ICD-10-CM

## 2023-02-03 DIAGNOSIS — D701 Agranulocytosis secondary to cancer chemotherapy: Secondary | ICD-10-CM | POA: Diagnosis not present

## 2023-02-03 DIAGNOSIS — Z95828 Presence of other vascular implants and grafts: Secondary | ICD-10-CM

## 2023-02-03 DIAGNOSIS — Z5111 Encounter for antineoplastic chemotherapy: Secondary | ICD-10-CM

## 2023-02-03 DIAGNOSIS — T451X5A Adverse effect of antineoplastic and immunosuppressive drugs, initial encounter: Secondary | ICD-10-CM | POA: Diagnosis not present

## 2023-02-03 DIAGNOSIS — C8102 Nodular lymphocyte predominant Hodgkin lymphoma, intrathoracic lymph nodes: Secondary | ICD-10-CM

## 2023-02-03 LAB — CBC WITH DIFFERENTIAL (CANCER CENTER ONLY)
Abs Immature Granulocytes: 0.01 10*3/uL (ref 0.00–0.07)
Basophils Absolute: 0 10*3/uL (ref 0.0–0.1)
Basophils Relative: 1 %
Eosinophils Absolute: 0.1 10*3/uL (ref 0.0–0.5)
Eosinophils Relative: 6 %
HCT: 36.4 % — ABNORMAL LOW (ref 39.0–52.0)
Hemoglobin: 12 g/dL — ABNORMAL LOW (ref 13.0–17.0)
Immature Granulocytes: 1 %
Lymphocytes Relative: 39 %
Lymphs Abs: 0.6 10*3/uL — ABNORMAL LOW (ref 0.7–4.0)
MCH: 27.8 pg (ref 26.0–34.0)
MCHC: 33 g/dL (ref 30.0–36.0)
MCV: 84.3 fL (ref 80.0–100.0)
Monocytes Absolute: 0.5 10*3/uL (ref 0.1–1.0)
Monocytes Relative: 34 %
Neutro Abs: 0.3 10*3/uL — CL (ref 1.7–7.7)
Neutrophils Relative %: 19 %
Platelet Count: 182 10*3/uL (ref 150–400)
RBC: 4.32 MIL/uL (ref 4.22–5.81)
RDW: 15.2 % (ref 11.5–15.5)
WBC Count: 1.6 10*3/uL — ABNORMAL LOW (ref 4.0–10.5)
nRBC: 0 % (ref 0.0–0.2)

## 2023-02-03 LAB — CMP (CANCER CENTER ONLY)
ALT: 23 U/L (ref 0–44)
AST: 21 U/L (ref 15–41)
Albumin: 4.2 g/dL (ref 3.5–5.0)
Alkaline Phosphatase: 101 U/L (ref 38–126)
Anion gap: 8 (ref 5–15)
BUN: 8 mg/dL (ref 6–20)
CO2: 28 mmol/L (ref 22–32)
Calcium: 8.9 mg/dL (ref 8.9–10.3)
Chloride: 103 mmol/L (ref 98–111)
Creatinine: 0.98 mg/dL (ref 0.61–1.24)
GFR, Estimated: >60 mL/min (ref >60)
Glucose, Bld: 104 mg/dL — ABNORMAL HIGH (ref 70–99)
Potassium: 3.2 mmol/L — ABNORMAL LOW (ref 3.5–5.1)
Sodium: 139 mmol/L (ref 135–145)
Total Bilirubin: 0.3 mg/dL (ref 0.3–1.2)
Total Protein: 6.7 g/dL (ref 6.5–8.1)

## 2023-02-03 MED ORDER — SODIUM CHLORIDE 0.9% FLUSH
10.0000 mL | Freq: Once | INTRAVENOUS | Status: AC
  Administered 2023-02-03: 10 mL

## 2023-02-03 MED ORDER — AMOXICILLIN-POT CLAVULANATE 875-125 MG PO TABS: 1.0000 | ORAL_TABLET | Freq: Two times a day (BID) | ORAL | 0 refills | Status: AC

## 2023-02-03 MED ORDER — SODIUM CHLORIDE 0.9 % IV SOLN: Freq: Once | INTRAVENOUS | Status: AC

## 2023-02-03 MED ORDER — SODIUM CHLORIDE 0.9 % IV SOLN
10.0000 mg | Freq: Once | INTRAVENOUS | Status: AC
  Administered 2023-02-03: 10 mg via INTRAVENOUS
  Filled 2023-02-03: qty 10

## 2023-02-03 MED ORDER — PALONOSETRON HCL INJECTION 0.25 MG/5ML
0.2500 mg | Freq: Once | INTRAVENOUS | Status: AC
  Administered 2023-02-03: 0.25 mg via INTRAVENOUS
  Filled 2023-02-03: qty 5

## 2023-02-03 MED ORDER — DOXORUBICIN HCL CHEMO IV INJECTION 2 MG/ML
25.0000 mg/m2 | Freq: Once | INTRAVENOUS | Status: AC
  Administered 2023-02-03: 56 mg via INTRAVENOUS
  Filled 2023-02-03: qty 28

## 2023-02-03 MED ORDER — VINBLASTINE SULFATE CHEMO INJECTION 1 MG/ML
6.0000 mg/m2 | Freq: Once | INTRAVENOUS | Status: AC
  Administered 2023-02-03: 13.6 mg via INTRAVENOUS
  Filled 2023-02-03: qty 13.6

## 2023-02-03 MED ORDER — SODIUM CHLORIDE 0.9 % IV SOLN
375.0000 mg/m2 | Freq: Once | INTRAVENOUS | Status: AC
  Administered 2023-02-03: 800 mg via INTRAVENOUS
  Filled 2023-02-03: qty 80

## 2023-02-03 MED ORDER — SODIUM CHLORIDE 0.9 % IV SOLN
150.0000 mg | Freq: Once | INTRAVENOUS | Status: AC
  Administered 2023-02-03: 150 mg via INTRAVENOUS
  Filled 2023-02-03: qty 150

## 2023-02-03 NOTE — Progress Notes (Signed)
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 male who has been referred to [place] by .Lazoff, Shawn P, DO and Dr. Clydell Hakim MD for evaluation and management of newly diagnosed mediastinal mass concerning for likely lymphoma.  Patient has a history of fetal alcohol syndrome with developmental delay, narcolepsy, ADD, exercise-induced asthma and lives with his adoptive parents. He recently presented to the hospital on 07/01/2022 with 2-week history of worsening shortness of breath cough and wheezing.  He received his asthma treatment but was still noted to have significant cough.  In the emergency room he had a chest x-ray which showed an mediastinal mass. Subsequent CT chest with contrast on 07/01/2022 showed extensive mediastinal and hilar lymphadenopathy with conglomerate soft tissue mass/adenopathy within the anterior mediastinum measuring 11.2 x 7.9 x 13 cm. Compression of the left brachiocephalic and SVC within the mid though these vascular structures remain patent.  Patient did not have any clinical signs or symptoms of SVC compression syndrome or left upper extremity swelling.  Patient subsequently had a CT of the abdomen and pelvis which showed no acute intra-abdominal or intrapelvic abnormalities. He underwent a CT-guided core needle biopsy of the mediastinal mass by interventional radiology.  The official pathology results from his biopsy are not currently available at the time of this clinic visit. I did call and talk to the pathologist Dr. [place] Callas and she notes that this either looks like a lymphoma or thymoma and she is running additional tests to make a final  diagnosis.  Patient's father accompanied him for this visit and his mother who helps make most of the decisions in tandem with his father was present on the phone.  They do not note any unexplained fevers chills night sweats or significant weight loss.  His breathing has been relatively stable since hospital discharge but he is still having some cough.  I confirmed adequacy of tissue sampling with the pathologist and the patient was then started on prednisone to try to help his cough by drinking his mediastinal tumor some and reducing bronchial inflammation.  Patient is unable to provide much information or review of systems on account of his developmental delay issues.  Interval History:   Paul [name] is a wonderful 32 y.o. male who is here for continued evaluation and management of classical hogkins lymphoma.   Patient was last seen by me on 01/15/2023 and was doing well overall with no new medical concerns.   Today, he is accompanied by his father. He denies any fever, chills, night sweat, tingling/numbness, dental issues/pain, sore throat, back pain, abdominal pain, diarrhea, urinary discomfort, or breathing issues. He endorses normal eating and sleeping habits.  MEDICAL HISTORY:  Past Medical History:  Diagnosis Date   ADD (attention deficit disorder)    ADHD (attention deficit hyperactivity disorder)    Asthma    excercise induced and pollen   Auditory processing disorder    Complication of anesthesia    mother states pt. is harder to put under anes. and hard to wake up due to fetal alcohol syndrome; can be combative, per mother; states he will do better if mother is in PACU when he is coming out of anes.   Development delay  states mental status of a 32 year old   Exercise-induced asthma    prn inhaler/neb.   Fetal alcohol syndrome    Narcolepsy    Non-restorable tooth 09/2015   teeth   Separation anxiety    Twin birth     SURGICAL HISTORY: Past Surgical History:   Procedure Laterality Date   IR IMAGING GUIDED PORT INSERTION  08/11/2022   MULTIPLE EXTRACTIONS WITH ALVEOLOPLASTY N/A 09/23/2015   Procedure: MULTIPLE EXTRACTION WITH ALVEOLOPLASTY;  Surgeon: Ocie Doyne, DDS;  Location:  SURGERY CENTER;  Service: Oral Surgery;  Laterality: N/A;   PYLOROMYOTOMY     age [age], performed found to not have stenosis on surgical exam   PYLOROMYOTOMY     did not have pyloric stenosis; did surgery on the wrong twin   RADIOLOGY WITH ANESTHESIA N/A 01/16/2020   Procedure: MRI WITH ANESTHESIA  BRAIN WITH AND WITHOUT CONTRAST;  Surgeon: Radiologist, Medication, MD;  Location: [facility] OR;  Service: Radiology;  Laterality: N/A;    SOCIAL HISTORY: Social History   Socioeconomic History   Marital status: Single    Spouse name: Not on file   Number of children: Not on file   Years of education: Not on file   Highest education level: Not on file  Occupational History   Not on file  Tobacco Use   Smoking status: Never   Smokeless tobacco: Never  Vaping Use   Vaping Use: Never used  Substance and Sexual Activity   Alcohol use: No   Drug use: No   Sexual activity: Not on file  Other Topics Concern   Not on file  Social History Narrative   ** Merged History Encounter **       Social Determinants of Health   Financial Resource Strain: Not on file  Food Insecurity: Not on file  Transportation Needs: Not on file  Physical Activity: Not on file  Stress: Not on file  Social Connections: Not on file  Intimate Partner Violence: Not on file    FAMILY HISTORY: Family History  Adopted: Yes  Problem Relation Age of Onset   Mental illness Mother    Diabetes Mother    Hypertension Mother    Schizophrenia Mother    Bipolar disorder Mother    Mental retardation Father    Sleep apnea Neg Hx     ALLERGIES:  is allergic to other.  MEDICATIONS:  Current Outpatient Medications  Medication Sig Dispense Refill   albuterol (PROVENTIL HFA;VENTOLIN HFA)  108 (90 BASE) MCG/ACT inhaler Inhale into the lungs every 6 (six) hours as needed for wheezing or shortness of breath.     albuterol (PROVENTIL) (5 MG/ML) 0.5% nebulizer solution Take 2.5 mg by nebulization every 6 (six) hours as needed for wheezing or shortness of breath.     Cholecalciferol (VITAMIN D3) 250 MCG (10000 UT) capsule Take 10,000 Units by mouth daily.     dexamethasone (DECADRON) 4 MG tablet Take 2 tablets by mouth once a day for 3 days after chemotherapy. Take with food. 30 tablet 1   lidocaine-prilocaine (EMLA) cream Apply to affected area once 30 g 3   loratadine (CLARITIN) 10 MG tablet Take 10 mg by mouth daily.     mometasone-formoterol (DULERA) 200-5 MCG/ACT AERO Inhale 2 puffs into the lungs 2 (two) times daily. 8.8 g 0   Multiple Vitamin (MULTIVITAMIN WITH MINERALS) TABS tablet Take 1 tablet by mouth daily.     ondansetron (ZOFRAN) 4 MG tablet Take 1 tablet (4 mg total) by mouth  every 6 (six) hours as needed for nausea. 20 tablet 0   prochlorperazine (COMPAZINE) 10 MG tablet Take 1 tablet (10 mg total) by mouth every 6 (six) hours as needed for nausea or vomiting. 30 tablet 1   triamcinolone (KENALOG) 0.025 % ointment Apply 1 Application topically 2 (two) times daily. To rash on hands 30 g 0   urea (CARMOL) 10 % cream Apply topically 2 (two) times daily. To hands and feet 453 g 1   No current facility-administered medications for this visit.    REVIEW OF SYSTEMS:    10 Point review of Systems was done is negative except as noted above.   PHYSICAL EXAMINATION  BP 97/72 (BP Location: Right Arm, Patient Position: Sitting)   Pulse (!) 109 Comment: nusre is aware  Temp 98.3 F (36.8 C) (Oral)   Resp 19   Ht 5\' 10"  (1.778 m)   Wt 241 lb (109.3 kg)   SpO2 98%   BMI 34.58 kg/m   GENERAL:alert, in no acute distress and comfortable SKIN: no acute rashes, no significant lesions EYES: conjunctiva are pink and non-injected, sclera anicteric OROPHARYNX: MMM, no exudates,  no oropharyngeal erythema or ulceration NECK: supple, no JVD LYMPH:  no palpable lymphadenopathy in the cervical, axillary or inguinal regions LUNGS: clear to auscultation b/l with normal respiratory effort HEART: regular rate & rhythm ABDOMEN:  normoactive bowel sounds , non tender, not distended. Extremity: no pedal edema PSYCH: alert & oriented x 3 with fluent speech NEURO: no focal motor/sensory deficits   LABORATORY DATA:  I have reviewed the data as listed  .    Latest Ref Rng & Units 02/05/2023    2:11 PM 02/03/2023   12:19 PM 01/20/2023    1:22 PM  CBC  WBC 4.0 - 10.5 K/uL 12.3  1.6  7.1   Hemoglobin 13.0 - 17.0 g/dL 91.4  78.2  95.6   Hematocrit 39.0 - 52.0 % 35.1  36.4  36.9   Platelets 150 - 400 K/uL 154  182  252    ANC 300  .    Latest Ref Rng & Units 02/03/2023   12:19 PM 01/20/2023    1:22 PM 01/15/2023    8:23 AM  CMP  Glucose 70 - 99 mg/dL 213  086  89   BUN 6 - 20 mg/dL 8  13  10    Creatinine 0.61 - 1.24 mg/dL 5.78  4.69  6.29   Sodium 135 - 145 mmol/L 139  138  138   Potassium 3.5 - 5.1 mmol/L 3.2  3.9  3.6   Chloride 98 - 111 mmol/L 103  104  103   CO2 22 - 32 mmol/L 28  29  29    Calcium 8.9 - 10.3 mg/dL 8.9  8.9  9.0   Total Protein 6.5 - 8.1 g/dL 6.7  6.5  6.5   Total Bilirubin 0.3 - 1.2 mg/dL 0.3  0.2  0.3   Alkaline Phos 38 - 126 U/L 101  96  101   AST 15 - 41 U/L 21  20  23    ALT 0 - 44 U/L 23  28  25     . Lab Results  Component Value Date   LDH 171 07/03/2022   RADIOGRAPHIC STUDIES: I have personally reviewed the radiological images as listed and agreed with the findings in the report. No results found.  Surgical Pathology result from 07/03/2022: FINAL MICROSCOPIC DIAGNOSIS:  A. MEDIASTINAL MASS, ANTERIOR, NEEDLE CORE BIOPSY: -Atypical lymphoid proliferation  consistent with classical Hodgkin lymphoma -See comment  COMMENT:  The sections show needle core biopsy fragments displaying a nodular and diffuse lymphoid proliferation  associated with dense sclerosis.  The lymphoid process shows predominance of small lymphoid cells admixed to a lesser extent with histiocytes, eosinophils in addition to the large atypical mononuclear and occasionally lobated lymphoid appearing cells with variably prominent nucleoli.  Flow cytometric analysis was performed [facility] [phone]) and shows predominance of CD4-positive T cells. No monoclonal B-cell population identified.  In addition, a battery of immunohistochemical stains was performed including CD30, CD15, mum 1, LCA, CD20, PAX5, CD3, CD5, CD4, CD8, TdT, cytokeratin AE1/AE3, cytokeratin 8/18 and EBV in situ hybridization with appropriate controls.  The large atypical lymphoid appearing cells are positive for CD15, CD30, PAX5, mum 1 and rare cells for CD20.  No significant staining is seen with LCA, EBV, CD3, CD5, CD4, CD8.  Cytokeratin stains highlight a small focus of positivity likely representing native epithelial elements.  No significant TdT positivity is identified.  The small lymphocytes in the background show a mixture of T and B cells with predominance of T cells.  The latter show predominance of CD4 positive cells.  Overall, the features are atypical and most consistent with classical Hodgkin lymphoma which is best subclassified as nodular sclerosis type.   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 The mass was causing some compression of his SVC and left brachiocephalic vein but no complete obstruction or symptoms related to this. No constitutional symptoms.  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  PLAN:  -Discussed lab results on 02/03/2023 in detail with  patient. CBC showed WBC of 1.6K, hemoglobin of 12.0, and platelets of 182K. -potassium low at 3.2 -neutrophil counts low despite growth factor injections -discussed option of either receiving extra growth factor injections with antibiotics or to wait a few days until counts improve before administering last dose -Will proceed with last chemotherapy session today -discussed that there is a higher risk of infection with proceeding with treatment with low neutrophils -will prescribe oral antibiotic for prophylaxis and informed patient to call us with any fever symptoms -recommend patient to regularly consume potassium-rich foods such as orange juice and bananas -answered all of patient's father's questions in detail -will prescribe one dose of oral potassium -repeat imaging in 3 weeks -proceed with next cycle of chemotherapy treatment -Continue B-complex supplement  FOLLOW-UP: -Portflush and labs this Friday 02/05/2023 -PEt/CT in 3 weeks -RTC with Dr Candise Che with portflush and labs in 4 weeks  The total time spent in the appointment was 30 minutes* .  All of the patient's questions were answered with apparent satisfaction. The patient knows to call the clinic with any problems, questions or concerns.   Wyvonnia Lora MD MS AAHIVMS [facility] Hematology/Oncology Physician [facility]  .*Total Encounter Time as defined by the Centers for Medicare and Medicaid Services includes, in addition to the face-to-face time of a patient visit (documented in the note above) non-face-to-face time: obtaining and reviewing outside history, ordering and reviewing medications, tests or procedures, care coordination (communications with other health care professionals or caregivers) and documentation in the medical record.    I,Mitra Faeizi,acting as a Neurosurgeon for [employer]  Candise Che, MD.,have documented all relevant documentation on the behalf of Wyvonnia Lora, MD,as directed by  Wyvonnia Lora, MD while in the  presence of Wyvonnia Lora, MD.  .I have reviewed the above documentation for accuracy and completeness, and I agree with the above. Johney Maine MD ADDENDUM  Rpt cbc showed resolution of neutropenia.

## 2023-02-03 NOTE — Progress Notes (Signed)
Patient seen by Dr. Addison Naegeli are within treatment parameters.  Labs reviewed: and are not all within treatment parameters.    Dr Candise Che aware WBC 1.6, ANC: 0.3, K : 3.2 Per physician team, patient is ready for treatment and there are NO modifications to the treatment plan.

## 2023-02-03 NOTE — Patient Instructions (Signed)
[facility]  Discharge Instructions: Thank you for choosing [facility] to provide your oncology and hematology care.   If you have a lab appointment with the Cancer Center, please go directly to the Cancer Center and check in at the registration area.   Wear comfortable clothing and clothing appropriate for easy access to any Portacath or PICC line.   We strive to give you quality time with your provider. You may need to reschedule your appointment if you arrive late (15 or more minutes).  Arriving late affects you and other patients whose appointments are after yours.  Also, if you miss three or more appointments without notifying the office, you may be dismissed from the clinic at the provider's discretion.      For prescription refill requests, have your pharmacy contact our office and allow 72 hours for refills to be completed.    Today you received the following chemotherapy and/or immunotherapy agents: doxorubicin, vinblastine, dacarbazine      To help prevent nausea and vomiting after your treatment, we encourage you to take your nausea medication as directed.  BELOW ARE SYMPTOMS THAT SHOULD BE REPORTED IMMEDIATELY: *FEVER GREATER THAN 100.4 F (38 C) OR HIGHER *CHILLS OR SWEATING *NAUSEA AND VOMITING THAT IS NOT CONTROLLED WITH YOUR NAUSEA MEDICATION *UNUSUAL SHORTNESS OF BREATH *UNUSUAL BRUISING OR BLEEDING *URINARY PROBLEMS (pain or burning when urinating, or frequent urination) *BOWEL PROBLEMS (unusual diarrhea, constipation, pain near the anus) TENDERNESS IN MOUTH AND THROAT WITH OR WITHOUT PRESENCE OF ULCERS (sore throat, sores in mouth, or a toothache) UNUSUAL RASH, SWELLING OR PAIN  UNUSUAL VAGINAL DISCHARGE OR ITCHING   Items with * indicate a potential emergency and should be followed up as soon as possible or go to the Emergency Department if any problems should occur.  Please show the CHEMOTHERAPY ALERT CARD or  IMMUNOTHERAPY ALERT CARD at check-in to the Emergency Department and triage nurse.  Should you have questions after your visit or need to cancel or reschedule your appointment, please contact [facility]  Dept: [phone]  and follow the prompts.  Office hours are 8:00 a.m. to 4:30 p.m. Monday - Friday. Please note that voicemails left after 4:00 p.m. may not be returned until the following business day.  We are closed weekends and major holidays. You have access to a nurse at all times for urgent questions. Please call the main number to the clinic Dept: [phone] and follow the prompts.   For any non-urgent questions, you may also contact your provider using MyChart. We now offer e-Visits for anyone 18 and older to request care online for non-urgent symptoms. For details visit [email or web address].   Also download the MyChart app! Go to the app store, search "MyChart", open the app, select [facility], and log in with your MyChart username and password.   

## 2023-02-04 ENCOUNTER — Inpatient Hospital Stay: Payer: Medicare Other

## 2023-02-04 VITALS — BP 115/80 | HR 96 | Temp 97.7°F | Resp 18

## 2023-02-04 DIAGNOSIS — Z5111 Encounter for antineoplastic chemotherapy: Secondary | ICD-10-CM | POA: Diagnosis not present

## 2023-02-04 DIAGNOSIS — C8192 Hodgkin lymphoma, unspecified, intrathoracic lymph nodes: Secondary | ICD-10-CM

## 2023-02-04 MED ORDER — FILGRASTIM-SNDZ 300 MCG/0.5ML IJ SOSY
300.0000 ug | PREFILLED_SYRINGE | Freq: Once | INTRAMUSCULAR | Status: AC
  Administered 2023-02-04: 300 ug via SUBCUTANEOUS
  Filled 2023-02-04: qty 0.5

## 2023-02-05 ENCOUNTER — Inpatient Hospital Stay: Payer: Medicare Other

## 2023-02-05 ENCOUNTER — Telehealth: Payer: Self-pay | Admitting: Hematology

## 2023-02-05 DIAGNOSIS — C8192 Hodgkin lymphoma, unspecified, intrathoracic lymph nodes: Secondary | ICD-10-CM

## 2023-02-05 DIAGNOSIS — Z95828 Presence of other vascular implants and grafts: Secondary | ICD-10-CM

## 2023-02-05 DIAGNOSIS — Z5111 Encounter for antineoplastic chemotherapy: Secondary | ICD-10-CM | POA: Diagnosis not present

## 2023-02-05 DIAGNOSIS — C8102 Nodular lymphocyte predominant Hodgkin lymphoma, intrathoracic lymph nodes: Secondary | ICD-10-CM

## 2023-02-05 LAB — CBC WITH DIFFERENTIAL (CANCER CENTER ONLY)
Abs Immature Granulocytes: 0.06 10*3/uL (ref 0.00–0.07)
Basophils Absolute: 0 10*3/uL (ref 0.0–0.1)
Basophils Relative: 0 %
Eosinophils Absolute: 0 10*3/uL (ref 0.0–0.5)
Eosinophils Relative: 0 %
HCT: 35.1 % — ABNORMAL LOW (ref 39.0–52.0)
Hemoglobin: 12 g/dL — ABNORMAL LOW (ref 13.0–17.0)
Immature Granulocytes: 1 %
Lymphocytes Relative: 2 %
Lymphs Abs: 0.3 10*3/uL — ABNORMAL LOW (ref 0.7–4.0)
MCH: 28.4 pg (ref 26.0–34.0)
MCHC: 34.2 g/dL (ref 30.0–36.0)
MCV: 83.2 fL (ref 80.0–100.0)
Monocytes Absolute: 0.5 10*3/uL (ref 0.1–1.0)
Monocytes Relative: 4 %
Neutro Abs: 11.5 10*3/uL — ABNORMAL HIGH (ref 1.7–7.7)
Neutrophils Relative %: 93 %
Platelet Count: 154 10*3/uL (ref 150–400)
RBC: 4.22 MIL/uL (ref 4.22–5.81)
RDW: 15.3 % (ref 11.5–15.5)
Smear Review: NORMAL
WBC Count: 12.3 10*3/uL — ABNORMAL HIGH (ref 4.0–10.5)
nRBC: 0 % (ref 0.0–0.2)

## 2023-02-05 MED ORDER — FILGRASTIM-SNDZ 300 MCG/0.5ML IJ SOSY
300.0000 ug | PREFILLED_SYRINGE | Freq: Once | INTRAMUSCULAR | Status: AC
  Administered 2023-02-05: 300 ug via SUBCUTANEOUS
  Filled 2023-02-05: qty 0.5

## 2023-02-05 MED ORDER — HEPARIN SOD (PORK) LOCK FLUSH 100 UNIT/ML IV SOLN
500.0000 [IU] | Freq: Once | INTRAVENOUS | Status: AC
  Administered 2023-02-05: 500 [IU]

## 2023-02-05 MED ORDER — SODIUM CHLORIDE 0.9% FLUSH
10.0000 mL | Freq: Once | INTRAVENOUS | Status: AC
  Administered 2023-02-05: 10 mL

## 2023-02-06 ENCOUNTER — Inpatient Hospital Stay: Payer: Medicare Other | Attending: Hematology

## 2023-02-06 ENCOUNTER — Other Ambulatory Visit: Payer: Self-pay

## 2023-02-06 VITALS — BP 118/63 | HR 92 | Temp 98.0°F | Resp 20

## 2023-02-06 DIAGNOSIS — C8179 Other classical Hodgkin lymphoma, extranodal and solid organ sites: Secondary | ICD-10-CM | POA: Diagnosis present

## 2023-02-06 DIAGNOSIS — Z5189 Encounter for other specified aftercare: Secondary | ICD-10-CM | POA: Diagnosis not present

## 2023-02-06 DIAGNOSIS — C8192 Hodgkin lymphoma, unspecified, intrathoracic lymph nodes: Secondary | ICD-10-CM

## 2023-02-06 MED ORDER — FILGRASTIM-SNDZ 300 MCG/0.5ML IJ SOSY
300.0000 ug | PREFILLED_SYRINGE | Freq: Once | INTRAMUSCULAR | Status: AC
  Administered 2023-02-06: 300 ug via SUBCUTANEOUS
  Filled 2023-02-06: qty 0.5

## 2023-02-10 ENCOUNTER — Encounter: Payer: Self-pay | Admitting: Hematology

## 2023-02-10 NOTE — Progress Notes (Incomplete)
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 male who has been referred to [place] by .Lazoff, Shawn P, DO and Dr. Clydell Hakim MD for evaluation and management of newly diagnosed mediastinal mass concerning for likely lymphoma.  Patient has a history of fetal alcohol syndrome with developmental delay, narcolepsy, ADD, exercise-induced asthma and lives with his adoptive parents. He recently presented to the hospital on 07/01/2022 with 2-week history of worsening shortness of breath cough and wheezing.  He received his asthma treatment but was still noted to have significant cough.  In the emergency room he had a chest x-ray which showed an mediastinal mass. Subsequent CT chest with contrast on 07/01/2022 showed extensive mediastinal and hilar lymphadenopathy with conglomerate soft tissue mass/adenopathy within the anterior mediastinum measuring 11.2 x 7.9 x 13 cm. Compression of the left brachiocephalic and SVC within the mid though these vascular structures remain patent.  Patient did not have any clinical signs or symptoms of SVC compression syndrome or left upper extremity swelling.  Patient subsequently had a CT of the abdomen and pelvis which showed no acute intra-abdominal or intrapelvic abnormalities. He underwent a CT-guided core needle biopsy of the mediastinal mass by interventional radiology.  The official pathology results from his biopsy are not currently available at the time of this clinic visit. I did call and talk to the pathologist Dr. Buffalo Callas and she notes that this either looks like a lymphoma or thymoma and she is running additional tests to make a final  diagnosis.  Patient's father accompanied him for this visit and his mother who helps make most of the decisions in tandem with his father was present on the phone.  They do not note any unexplained fevers chills night sweats or significant weight loss.  His breathing has been relatively stable since hospital discharge but he is still having some cough.  I confirmed adequacy of tissue sampling with the pathologist and the patient was then started on prednisone to try to help his cough by drinking his mediastinal tumor some and reducing bronchial inflammation.  Patient is unable to provide much information or review of systems on account of his developmental delay issues.  Interval History:   Paul [name] is a wonderful 32 y.o. male who is here for continued evaluation and management of classical hogkins lymphoma.   Patient was last seen by me on 01/15/2023 and was doing well overall with no new medical concerns.   Today, he is accompanied by his father. He denies any fever, chills, night sweat, tingling/numbness, dental issues/pain, sore throat, back pain, abdominal pain, diarrhea, urinary discomfort, or breathing issues. He endorses normal eating and sleeping habits.  MEDICAL HISTORY:  Past Medical History:  Diagnosis Date  . ADD (attention deficit disorder)   . ADHD (attention deficit hyperactivity disorder)   . Asthma    excercise induced and pollen  . Auditory processing disorder   . Complication of anesthesia    mother states pt. is harder to put under anes. and hard to wake up due to fetal alcohol syndrome; can be combative, per mother; states he will do better if mother is in PACU when he is coming out of anes.  . Development delay  states mental status of a 32 year old  . Exercise-induced asthma    prn inhaler/neb.  . Fetal alcohol syndrome   . Narcolepsy   . Non-restorable tooth 09/2015   teeth  . Separation anxiety   . Twin birth     SURGICAL HISTORY: Past Surgical  History:  Procedure Laterality Date  . IR IMAGING GUIDED PORT INSERTION  08/11/2022  . MULTIPLE EXTRACTIONS WITH ALVEOLOPLASTY N/A 09/23/2015   Procedure: MULTIPLE EXTRACTION WITH ALVEOLOPLASTY;  Surgeon: Ocie Doyne, DDS;  Location: [facility];  Service: Oral Surgery;  Laterality: N/A;  . PYLOROMYOTOMY     age [age], performed found to not have stenosis on surgical exam  . PYLOROMYOTOMY     did not have pyloric stenosis; did surgery on the wrong twin  . RADIOLOGY WITH ANESTHESIA N/A 01/16/2020   Procedure: MRI WITH ANESTHESIA  BRAIN WITH AND WITHOUT CONTRAST;  Surgeon: Radiologist, Medication, MD;  Location: [facility] OR;  Service: Radiology;  Laterality: N/A;    SOCIAL HISTORY: Social History   Socioeconomic History  . Marital status: Single    Spouse name: Not on file  . Number of children: Not on file  . Years of education: Not on file  . Highest education level: Not on file  Occupational History  . Not on file  Tobacco Use  . Smoking status: Never  . Smokeless tobacco: Never  Vaping Use  . Vaping Use: Never used  Substance and Sexual Activity  . Alcohol use: No  . Drug use: No  . Sexual activity: Not on file  Other Topics Concern  . Not on file  Social History Narrative   ** Merged History Encounter **       Social Determinants of Health   Financial Resource Strain: Not on file  Food Insecurity: Not on file  Transportation Needs: Not on file  Physical Activity: Not on file  Stress: Not on file  Social Connections: Not on file  Intimate Partner Violence: Not on file    FAMILY HISTORY: Family History  Adopted: Yes  Problem Relation Age of Onset  . Mental illness Mother   . Diabetes Mother   . Hypertension Mother   . Schizophrenia Mother   . Bipolar disorder Mother   . Mental retardation Father   . Sleep apnea Neg Hx     ALLERGIES:  is allergic to other.  MEDICATIONS:  Current Outpatient Medications  Medication Sig Dispense Refill  .  albuterol (PROVENTIL HFA;VENTOLIN HFA) 108 (90 BASE) MCG/ACT inhaler Inhale into the lungs every 6 (six) hours as needed for wheezing or shortness of breath.    Marland Kitchen albuterol (PROVENTIL) (5 MG/ML) 0.5% nebulizer solution Take 2.5 mg by nebulization every 6 (six) hours as needed for wheezing or shortness of breath.    . Cholecalciferol (VITAMIN D3) 250 MCG (10000 UT) capsule Take 10,000 Units by mouth daily.    Marland Kitchen dexamethasone (DECADRON) 4 MG tablet Take 2 tablets by mouth once a day for 3 days after chemotherapy. Take with food. 30 tablet 1  . lidocaine-prilocaine (EMLA) cream Apply to affected area once 30 g 3  . loratadine (CLARITIN) 10 MG tablet Take 10 mg by mouth daily.    . mometasone-formoterol (DULERA) 200-5 MCG/ACT AERO Inhale 2 puffs into the lungs 2 (two) times daily. 8.8 g 0  . Multiple Vitamin (MULTIVITAMIN WITH MINERALS) TABS tablet Take 1 tablet by mouth daily.    . ondansetron (ZOFRAN) 4 MG tablet Take 1 tablet (4 mg total) by mouth  every 6 (six) hours as needed for nausea. 20 tablet 0  . prochlorperazine (COMPAZINE) 10 MG tablet Take 1 tablet (10 mg total) by mouth every 6 (six) hours as needed for nausea or vomiting. 30 tablet 1  . triamcinolone (KENALOG) 0.025 % ointment Apply 1 Application topically 2 (two) times daily. To rash on hands 30 g 0  . urea (CARMOL) 10 % cream Apply topically 2 (two) times daily. To hands and feet 453 g 1   No current facility-administered medications for this visit.    REVIEW OF SYSTEMS:    10 Point review of Systems was done is negative except as noted above.   PHYSICAL EXAMINATION  BP 97/72 (BP Location: Right Arm, Patient Position: Sitting)   Pulse (!) 109 Comment: nusre is aware  Temp 98.3 F (36.8 C) (Oral)   Resp 19   Ht 5\' 10"  (1.778 m)   Wt 241 lb (109.3 kg)   SpO2 98%   BMI 34.58 kg/m   GENERAL:alert, in no acute distress and comfortable SKIN: no acute rashes, no significant lesions EYES: conjunctiva are pink and non-injected,  sclera anicteric OROPHARYNX: MMM, no exudates, no oropharyngeal erythema or ulceration NECK: supple, no JVD LYMPH:  no palpable lymphadenopathy in the cervical, axillary or inguinal regions LUNGS: clear to auscultation b/l with normal respiratory effort HEART: regular rate & rhythm ABDOMEN:  normoactive bowel sounds , non tender, not distended. Extremity: no pedal edema PSYCH: alert & oriented x 3 with fluent speech NEURO: no focal motor/sensory deficits   LABORATORY DATA:  I have reviewed the data as listed  .    Latest Ref Rng & Units 02/05/2023    2:11 PM 02/03/2023   12:19 PM 01/20/2023    1:22 PM  CBC  WBC 4.0 - 10.5 K/uL 12.3  1.6  7.1   Hemoglobin 13.0 - 17.0 g/dL 40.9  81.1  91.4   Hematocrit 39.0 - 52.0 % 35.1  36.4  36.9   Platelets 150 - 400 K/uL 154  182  252    ANC 300  .    Latest Ref Rng & Units 02/03/2023   12:19 PM 01/20/2023    1:22 PM 01/15/2023    8:23 AM  CMP  Glucose 70 - 99 mg/dL 782  956  89   BUN 6 - 20 mg/dL 8  13  10    Creatinine 0.61 - 1.24 mg/dL 2.13  0.86  5.78   Sodium 135 - 145 mmol/L 139  138  138   Potassium 3.5 - 5.1 mmol/L 3.2  3.9  3.6   Chloride 98 - 111 mmol/L 103  104  103   CO2 22 - 32 mmol/L 28  29  29    Calcium 8.9 - 10.3 mg/dL 8.9  8.9  9.0   Total Protein 6.5 - 8.1 g/dL 6.7  6.5  6.5   Total Bilirubin 0.3 - 1.2 mg/dL 0.3  0.2  0.3   Alkaline Phos 38 - 126 U/L 101  96  101   AST 15 - 41 U/L 21  20  23    ALT 0 - 44 U/L 23  28  25     . Lab Results  Component Value Date   LDH 171 07/03/2022   RADIOGRAPHIC STUDIES: I have personally reviewed the radiological images as listed and agreed with the findings in the report. No results found.  Surgical Pathology result from 07/03/2022: FINAL MICROSCOPIC DIAGNOSIS:  A. MEDIASTINAL MASS, ANTERIOR, NEEDLE CORE BIOPSY: -Atypical lymphoid proliferation  consistent with classical Hodgkin lymphoma -See comment  COMMENT:  The sections show needle core biopsy fragments displaying a  nodular and diffuse lymphoid proliferation associated with dense sclerosis.  The lymphoid process shows predominance of small lymphoid cells admixed to a lesser extent with histiocytes, eosinophils in addition to the large atypical mononuclear and occasionally lobated lymphoid appearing cells with variably prominent nucleoli.  Flow cytometric analysis was performed [facility] [phone]) and shows predominance of CD4-positive T cells. No monoclonal B-cell population identified.  In addition, a battery of immunohistochemical stains was performed including CD30, CD15, mum 1, LCA, CD20, PAX5, CD3, CD5, CD4, CD8, TdT, cytokeratin AE1/AE3, cytokeratin 8/18 and EBV in situ hybridization with appropriate controls.  The large atypical lymphoid appearing cells are positive for CD15, CD30, PAX5, mum 1 and rare cells for CD20.  No significant staining is seen with LCA, EBV, CD3, CD5, CD4, CD8.  Cytokeratin stains highlight a small focus of positivity likely representing native epithelial elements.  No significant TdT positivity is identified.  The small lymphocytes in the background show a mixture of T and B cells with predominance of T cells.  The latter show predominance of CD4 positive cells.  Overall, the features are atypical and most consistent with classical Hodgkin lymphoma which is best subclassified as nodular sclerosis type.   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 The mass was causing some compression of his SVC and left brachiocephalic vein but no complete obstruction or symptoms related to this. No constitutional symptoms.  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  PLAN:  -Discussed lab results on 02/03/2023 in detail with patient. CBC showed WBC of 1.6K, hemoglobin of 12.0, and platelets of 182K. -potassium low at 3.2 -neutrophil counts low despite growth factor injections -discussed option of either receiving extra growth factor injections with antibiotics or to wait a few days until counts improve before administering last dose -Will proceed with last chemotherapy session today -discussed that there is a higher risk of infection with proceeding with treatment with low neutrophils -will prescribe oral antibiotic for prophylaxis and informed patient to call us with any fever symptoms -recommend patient to regularly consume potassium-rich foods such as orange juice and bananas -answered all of patient's father's questions in detail -will prescribe one dose of oral potassium -repeat imaging in 3 weeks -proceed with next cycle of chemotherapy treatment -Continue B-complex supplement  FOLLOW-UP: -Portflush and labs this Friday 02/05/2023 -PEt/CT in 3 weeks -RTC with Dr Candise Che with portflush and labs in 4 weeks  The total time spent in the appointment was *** minutes* .  All of the patient's questions were answered with apparent satisfaction. The patient knows to call the clinic with any problems, questions or concerns.   Wyvonnia Lora MD MS AAHIVMS [facility] Hematology/Oncology Physician [facility]  .*Total Encounter Time as defined by the Centers for Medicare and Medicaid Services includes, in addition to the face-to-face time of a patient visit (documented in the note above) non-face-to-face time: obtaining and reviewing outside history, ordering and reviewing medications, tests or procedures, care coordination (communications with other health care professionals or caregivers) and documentation in the medical record.    I,Mitra Faeizi,acting as a Neurosurgeon for [employer]  Candise Che, MD.,have documented all relevant documentation on the  behalf of Wyvonnia Lora, MD,as directed by  Wyvonnia Lora, MD while in the presence of Wyvonnia Lora, MD.  ***

## 2023-02-25 ENCOUNTER — Other Ambulatory Visit: Payer: Self-pay

## 2023-02-25 ENCOUNTER — Encounter (HOSPITAL_COMMUNITY)
Admission: RE | Admit: 2023-02-25 | Discharge: 2023-02-25 | Disposition: A | Discharge status: Home / Self Care | Payer: Medicare Other | Source: Ambulatory Visit | Attending: Hematology | Admitting: Hematology

## 2023-02-25 DIAGNOSIS — C8192 Hodgkin lymphoma, unspecified, intrathoracic lymph nodes: Secondary | ICD-10-CM

## 2023-02-25 LAB — GLUCOSE, CAPILLARY: Glucose-Capillary: 102 mg/dL — ABNORMAL HIGH (ref 70–99)

## 2023-02-25 MED ORDER — FLUDEOXYGLUCOSE F - 18 (FDG) INJECTION
12.0000 | Freq: Once | INTRAVENOUS | Status: AC
  Administered 2023-02-25: 11.87 via INTRAVENOUS

## 2023-03-01 ENCOUNTER — Inpatient Hospital Stay: Payer: Medicare Other

## 2023-03-01 ENCOUNTER — Inpatient Hospital Stay (HOSPITAL_BASED_OUTPATIENT_CLINIC_OR_DEPARTMENT_OTHER): Payer: Medicare Other | Admitting: Hematology

## 2023-03-01 ENCOUNTER — Other Ambulatory Visit: Payer: Self-pay

## 2023-03-01 VITALS — BP 105/73 | HR 92 | Temp 97.9°F | Resp 20 | Wt 244.4 lb

## 2023-03-01 DIAGNOSIS — C8192 Hodgkin lymphoma, unspecified, intrathoracic lymph nodes: Secondary | ICD-10-CM

## 2023-03-01 DIAGNOSIS — H0100B Unspecified blepharitis left eye, upper and lower eyelids: Secondary | ICD-10-CM

## 2023-03-01 DIAGNOSIS — H0100A Unspecified blepharitis right eye, upper and lower eyelids: Secondary | ICD-10-CM

## 2023-03-01 DIAGNOSIS — C8179 Other classical Hodgkin lymphoma, extranodal and solid organ sites: Secondary | ICD-10-CM | POA: Diagnosis not present

## 2023-03-01 DIAGNOSIS — C8102 Nodular lymphocyte predominant Hodgkin lymphoma, intrathoracic lymph nodes: Secondary | ICD-10-CM

## 2023-03-01 DIAGNOSIS — Z95828 Presence of other vascular implants and grafts: Secondary | ICD-10-CM

## 2023-03-01 LAB — CMP (CANCER CENTER ONLY)
ALT: 21 U/L (ref 0–44)
AST: 23 U/L (ref 15–41)
Albumin: 3.9 g/dL (ref 3.5–5.0)
Alkaline Phosphatase: 93 U/L (ref 38–126)
Anion gap: 5 (ref 5–15)
BUN: 13 mg/dL (ref 6–20)
CO2: 28 mmol/L (ref 22–32)
Calcium: 9.3 mg/dL (ref 8.9–10.3)
Chloride: 105 mmol/L (ref 98–111)
Creatinine: 0.76 mg/dL (ref 0.61–1.24)
GFR, Estimated: >60 mL/min (ref >60)
Glucose, Bld: 101 mg/dL — ABNORMAL HIGH (ref 70–99)
Potassium: 3.8 mmol/L (ref 3.5–5.1)
Sodium: 138 mmol/L (ref 135–145)
Total Bilirubin: 0.2 mg/dL — ABNORMAL LOW (ref 0.3–1.2)
Total Protein: 6.2 g/dL — ABNORMAL LOW (ref 6.5–8.1)

## 2023-03-01 LAB — CBC WITH DIFFERENTIAL (CANCER CENTER ONLY)
Abs Immature Granulocytes: 0.01 10*3/uL (ref 0.00–0.07)
Basophils Absolute: 0.1 10*3/uL (ref 0.0–0.1)
Basophils Relative: 1 %
Eosinophils Absolute: 0.1 10*3/uL (ref 0.0–0.5)
Eosinophils Relative: 3 %
HCT: 36.2 % — ABNORMAL LOW (ref 39.0–52.0)
Hemoglobin: 12.1 g/dL — ABNORMAL LOW (ref 13.0–17.0)
Immature Granulocytes: 0 %
Lymphocytes Relative: 30 %
Lymphs Abs: 1.5 10*3/uL (ref 0.7–4.0)
MCH: 28.2 pg (ref 26.0–34.0)
MCHC: 33.4 g/dL (ref 30.0–36.0)
MCV: 84.4 fL (ref 80.0–100.0)
Monocytes Absolute: 0.6 10*3/uL (ref 0.1–1.0)
Monocytes Relative: 13 %
Neutro Abs: 2.6 10*3/uL (ref 1.7–7.7)
Neutrophils Relative %: 53 %
Platelet Count: 243 10*3/uL (ref 150–400)
RBC: 4.29 MIL/uL (ref 4.22–5.81)
RDW: 15.1 % (ref 11.5–15.5)
WBC Count: 4.8 10*3/uL (ref 4.0–10.5)
nRBC: 0 % (ref 0.0–0.2)

## 2023-03-01 MED ORDER — DOXYCYCLINE HYCLATE 100 MG PO TABS: 100.0000 mg | ORAL_TABLET | Freq: Two times a day (BID) | ORAL | 0 refills | Status: AC

## 2023-03-01 MED ORDER — HEPARIN SOD (PORK) LOCK FLUSH 100 UNIT/ML IV SOLN
500.0000 [IU] | Freq: Once | INTRAVENOUS | Status: AC
  Administered 2023-03-01: 500 [IU]

## 2023-03-01 MED ORDER — OLOPATADINE HCL 0.1 % OP SOLN: 1.0000 [drp] | Freq: Two times a day (BID) | OPHTHALMIC | 2 refills | Status: DC

## 2023-03-01 MED ORDER — SODIUM CHLORIDE 0.9% FLUSH
10.0000 mL | Freq: Once | INTRAVENOUS | Status: AC
  Administered 2023-03-01: 10 mL

## 2023-03-01 NOTE — Progress Notes (Signed)
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 male who has been referred to [place] by .Lazoff, Shawn P, DO and Dr. Clydell Hakim MD for evaluation and management of newly diagnosed mediastinal mass concerning for likely lymphoma.  Patient has a history of fetal alcohol syndrome with developmental delay, narcolepsy, ADD, exercise-induced asthma and lives with his adoptive parents. He recently presented to the hospital on 07/01/2022 with 2-week history of worsening shortness of breath cough and wheezing.  He received his asthma treatment but was still noted to have significant cough.  In the emergency room he had a chest x-ray which showed an mediastinal mass. Subsequent CT chest with contrast on 07/01/2022 showed extensive mediastinal and hilar lymphadenopathy with conglomerate soft tissue mass/adenopathy within the anterior mediastinum measuring 11.2 x 7.9 x 13 cm. Compression of the left brachiocephalic and SVC within the mid though these vascular structures remain patent.  Patient did not have any clinical signs or symptoms of SVC compression syndrome or left upper extremity swelling.  Patient subsequently had a CT of the abdomen and pelvis which showed no acute intra-abdominal or intrapelvic abnormalities. He underwent a CT-guided core needle biopsy of the mediastinal mass by interventional radiology.  The official pathology results from his biopsy are not currently available at the time of this clinic visit. I did call and talk to the pathologist Dr. [place] Callas and she notes that this either looks like a lymphoma or thymoma and she is running additional tests to make a final  diagnosis.  Patient's father accompanied him for this visit and his mother who helps make most of the decisions in tandem with his father was present on the phone.  They do not note any unexplained fevers chills night sweats or significant weight loss.  His breathing has been relatively stable since hospital discharge but he is still having some cough.  I confirmed adequacy of tissue sampling with the pathologist and the patient was then started on prednisone to try to help his cough by drinking his mediastinal tumor some and reducing bronchial inflammation.  Patient is unable to provide much information or review of systems on account of his developmental delay issues.  Interval History:   Paul [name] is a wonderful 32 y.o. male who is here for continued evaluation and management of classical hogkins lymphoma.   Patient was last seen by me on 02/03/2023 and was doing well overall with no new medical concerns.   Today, he is accompanied by his father. Patient complains of eye pain and eyelids. He notes his eye is itching. He denies having allergies, runny nose, or changes in vision. Patient's father notes patient's eyes bulge and get discolored at night.   He reports no new toxicities to treatment.   He denies any new lumps/bumps, SOB, fever, chills, night sweat, leg swelling, sore throat, back pain, or abdominal pain. He endorses normal eating and sleeping habits.  MEDICAL HISTORY:  Past Medical History:  Diagnosis Date   ADD (attention deficit disorder)    ADHD (attention deficit hyperactivity disorder)    Asthma    excercise induced and pollen   Auditory processing disorder    Complication of anesthesia    mother states pt. is  harder to put under anes. and hard to wake up due to fetal alcohol syndrome; can be combative, per mother; states he will do better if mother is in PACU when he is coming out of anes.   Development delay    states mental status of a 32 year old    Exercise-induced asthma    prn inhaler/neb.   Fetal alcohol syndrome    Narcolepsy    Non-restorable tooth 09/2015   teeth   Separation anxiety    Twin birth     SURGICAL HISTORY: Past Surgical History:  Procedure Laterality Date   IR IMAGING GUIDED PORT INSERTION  08/11/2022   MULTIPLE EXTRACTIONS WITH ALVEOLOPLASTY N/A 09/23/2015   Procedure: MULTIPLE EXTRACTION WITH ALVEOLOPLASTY;  Surgeon: Ocie Doyne, DDS;  Location: [facility];  Service: Oral Surgery;  Laterality: N/A;   PYLOROMYOTOMY     age [age], performed found to not have stenosis on surgical exam   PYLOROMYOTOMY     did not have pyloric stenosis; did surgery on the wrong twin   RADIOLOGY WITH ANESTHESIA N/A 01/16/2020   Procedure: MRI WITH ANESTHESIA  BRAIN WITH AND WITHOUT CONTRAST;  Surgeon: Radiologist, Medication, MD;  Location: [facility] OR;  Service: Radiology;  Laterality: N/A;    SOCIAL HISTORY: Social History   Socioeconomic History   Marital status: Single    Spouse name: Not on file   Number of children: Not on file   Years of education: Not on file   Highest education level: Not on file  Occupational History   Not on file  Tobacco Use   Smoking status: Never   Smokeless tobacco: Never  Vaping Use   Vaping Use: Never used  Substance and Sexual Activity   Alcohol use: No   Drug use: No   Sexual activity: Not on file  Other Topics Concern   Not on file  Social History Narrative   ** Merged History Encounter **       Social Determinants of Health   Financial Resource Strain: Not on file  Food Insecurity: Not on file  Transportation Needs: Not on file  Physical Activity: Not on file  Stress: Not on file  Social Connections: Not on file  Intimate Partner Violence: Not on file    FAMILY HISTORY: Family History  Adopted: Yes  Problem Relation Age of Onset   Mental illness Mother    Diabetes Mother    Hypertension Mother    Schizophrenia Mother    Bipolar disorder Mother     Mental retardation Father    Sleep apnea Neg Hx     ALLERGIES:  is allergic to other.  MEDICATIONS:  Current Outpatient Medications  Medication Sig Dispense Refill   albuterol (PROVENTIL HFA;VENTOLIN HFA) 108 (90 BASE) MCG/ACT inhaler Inhale into the lungs every 6 (six) hours as needed for wheezing or shortness of breath.     albuterol (PROVENTIL) (5 MG/ML) 0.5% nebulizer solution Take 2.5 mg by nebulization every 6 (six) hours as needed for wheezing or shortness of breath.     Cholecalciferol (VITAMIN D3) 250 MCG (10000 UT) capsule Take 10,000 Units by mouth daily.     dexamethasone (DECADRON) 4 MG tablet Take 2 tablets by mouth once a day for 3 days after chemotherapy. Take with food. 30 tablet 1   lidocaine-prilocaine (EMLA) cream Apply to affected area once 30 g 3   loratadine (CLARITIN) 10 MG tablet Take 10 mg by mouth daily.     mometasone-formoterol (DULERA) 200-5 MCG/ACT AERO Inhale  2 puffs into the lungs 2 (two) times daily. 8.8 g 0   Multiple Vitamin (MULTIVITAMIN WITH MINERALS) TABS tablet Take 1 tablet by mouth daily.     ondansetron (ZOFRAN) 4 MG tablet Take 1 tablet (4 mg total) by mouth every 6 (six) hours as needed for nausea. 20 tablet 0   prochlorperazine (COMPAZINE) 10 MG tablet Take 1 tablet (10 mg total) by mouth every 6 (six) hours as needed for nausea or vomiting. 30 tablet 1   triamcinolone (KENALOG) 0.025 % ointment Apply 1 Application topically 2 (two) times daily. To rash on hands 30 g 0   urea (CARMOL) 10 % cream Apply topically 2 (two) times daily. To hands and feet 453 g 1   No current facility-administered medications for this visit.    REVIEW OF SYSTEMS:    10 Point review of Systems was done is negative except as noted above.   PHYSICAL EXAMINATION  BP 105/73   Pulse 92   Temp 97.9 F (36.6 C)   Resp 20   Wt 244 lb 6.4 oz (110.9 kg)   SpO2 98%   BMI 35.07 kg/m   GENERAL:alert, in no acute distress and comfortable SKIN: no acute rashes, no  significant lesions EYES: conjunctiva are pink and non-injected, sclera anicteric OROPHARYNX: MMM, no exudates, no oropharyngeal erythema or ulceration NECK: supple, no JVD LYMPH:  no palpable lymphadenopathy in the cervical, axillary or inguinal regions LUNGS: clear to auscultation b/l with normal respiratory effort HEART: regular rate & rhythm ABDOMEN:  normoactive bowel sounds , non tender, not distended. Extremity: no pedal edema PSYCH: alert & oriented x 3 with fluent speech NEURO: no focal motor/sensory deficits   LABORATORY DATA:  I have reviewed the data as listed  .    Latest Ref Rng & Units 03/01/2023    1:37 PM 02/05/2023    2:11 PM 02/03/2023   12:19 PM  CBC  WBC 4.0 - 10.5 K/uL 4.8  12.3  1.6   Hemoglobin 13.0 - 17.0 g/dL 16.1  09.6  04.5   Hematocrit 39.0 - 52.0 % 36.2  35.1  36.4   Platelets 150 - 400 K/uL 243  154  182    ANC 300  .    Latest Ref Rng & Units 03/01/2023    1:37 PM 02/03/2023   12:19 PM 01/20/2023    1:22 PM  CMP  Glucose 70 - 99 mg/dL 409  811  914   BUN 6 - 20 mg/dL 13  8  13    Creatinine 0.61 - 1.24 mg/dL 7.82  9.56  2.13   Sodium 135 - 145 mmol/L 138  139  138   Potassium 3.5 - 5.1 mmol/L 3.8  3.2  3.9   Chloride 98 - 111 mmol/L 105  103  104   CO2 22 - 32 mmol/L 28  28  29    Calcium 8.9 - 10.3 mg/dL 9.3  8.9  8.9   Total Protein 6.5 - 8.1 g/dL 6.2  6.7  6.5   Total Bilirubin 0.3 - 1.2 mg/dL 0.2  0.3  0.2   Alkaline Phos 38 - 126 U/L 93  101  96   AST 15 - 41 U/L 23  21  20    ALT 0 - 44 U/L 21  23  28     . Lab Results  Component Value Date   LDH 171 07/03/2022   RADIOGRAPHIC STUDIES: I have personally reviewed the radiological images as listed and agreed with the  findings in the report. No results found.  Surgical Pathology result from 07/03/2022: FINAL MICROSCOPIC DIAGNOSIS:  A. MEDIASTINAL MASS, ANTERIOR, NEEDLE CORE BIOPSY: -Atypical lymphoid proliferation consistent with classical Hodgkin lymphoma -See  comment  COMMENT:  The sections show needle core biopsy fragments displaying a nodular and diffuse lymphoid proliferation associated with dense sclerosis.  The lymphoid process shows predominance of small lymphoid cells admixed to a lesser extent with histiocytes, eosinophils in addition to the large atypical mononuclear and occasionally lobated lymphoid appearing cells with variably prominent nucleoli.  Flow cytometric analysis was performed [name] [phone]) and shows predominance of CD4-positive T cells. No monoclonal B-cell population identified.  In addition, a battery of immunohistochemical stains was performed including CD30, CD15, mum 1, LCA, CD20, PAX5, CD3, CD5, CD4, CD8, TdT, cytokeratin AE1/AE3, cytokeratin 8/18 and EBV in situ hybridization with appropriate controls.  The large atypical lymphoid appearing cells are positive for CD15, CD30, PAX5, mum 1 and rare cells for CD20.  No significant staining is seen with LCA, EBV, CD3, CD5, CD4, CD8.  Cytokeratin stains highlight a small focus of positivity likely representing native epithelial elements.  No significant TdT positivity is identified.  The small lymphocytes in the background show a mixture of T and B cells with predominance of T cells.  The latter show predominance of CD4 positive cells.  Overall, the features are atypical and most consistent with classical Hodgkin lymphoma which is best subclassified as nodular sclerosis type.   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 The mass was causing some compression of his SVC and left brachiocephalic vein but no complete obstruction or symptoms related to this. No constitutional symptoms.  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  PLAN:  -Discussed labs from today, 03/01/2023, in detail with patient. CBC shows WBC at 4.8 K, hemoglobin at 12.1, and platelets at 243 K -PET scan of skull base to thigh from 02/25/2023 was not finalized during clinic visit but I called the patients and his father later with the PET/CT results -Discusses the treatment options if PET scan shows lymphoma, which would be either 2nd line treatment options or 2nd line pre transplant transplant if he's a candidate for a transplant at [facility] -Informed patient that eye pain and itching may be due to a stye and that he may have a secondary infection  -Will prescribe antibiotics -DOxycycline and Pataday eye drops -Recommended patient to gently wash outside of the eyelids with soap and warm water for the next week -answered all of patient's father's questions in detail -Continue B-complex supplement -Will follow-up with patient and patient's family today or tomorrow with PET scan results on the phone  FOLLOW-UP: Referral to Dr Alphonsa Gin At [facility] Will decide on next line treatment  The total time spent in the appointment was 34 minutes* .  All of the patient's questions were answered with apparent satisfaction. The patient knows to call the clinic with any problems, questions or concerns.   Wyvonnia Lora MD MS AAHIVMS [facility] Hematology/Oncology Physician [facility]  .*Total Encounter Time as defined by the Centers for Medicare and Medicaid Services includes, in addition to the face-to-face time of a patient visit (documented in the note  above) non-face-to-face time: obtaining and reviewing outside history, ordering and reviewing medications, tests or procedures, care coordination (communications with other health care professionals or caregivers) and documentation in the medical record.   Alben Deeds Teague,acting as a Neurosurgeon for Wyvonnia Lora, MD.,have documented  all relevant documentation on the behalf of Wyvonnia Lora, MD,as directed by  Wyvonnia Lora, MD while in the presence of Wyvonnia Lora, MD.  .I have reviewed the above documentation for accuracy and completeness, and I agree with the above. Johney Maine MD

## 2023-03-03 ENCOUNTER — Telehealth: Payer: Self-pay

## 2023-03-03 ENCOUNTER — Encounter: Payer: Self-pay | Admitting: Hematology

## 2023-03-03 NOTE — Telephone Encounter (Signed)
Returned call to pt's mother regarding pt's recent scan and possible referral to [facility]. Pt's Mother states that there is no one available to take pt to [facility] on a regular basis and there is no one that could stay with pt there is he was to possibly have a transplant. Pt' s Mother is non-ambulatory at this time and bed bound. Pt 's Father takes care of multiple special needs adult children in the home as well as the wife. Dr Candise Che informed of mothers concerns.

## 2023-03-07 ENCOUNTER — Encounter: Payer: Self-pay | Admitting: Hematology

## 2023-03-15 ENCOUNTER — Encounter: Payer: Self-pay | Admitting: Hematology

## 2023-03-15 ENCOUNTER — Other Ambulatory Visit: Payer: Self-pay | Admitting: Hematology

## 2023-03-15 DIAGNOSIS — C8192 Hodgkin lymphoma, unspecified, intrathoracic lymph nodes: Secondary | ICD-10-CM

## 2023-03-15 MED ORDER — PROCHLORPERAZINE MALEATE 10 MG PO TABS
10.0000 mg | ORAL_TABLET | Freq: Four times a day (QID) | ORAL | 1 refills | Status: DC | PRN
Start: 2023-03-15 — End: 2023-11-23

## 2023-03-15 MED ORDER — LIDOCAINE-PRILOCAINE 2.5-2.5 % EX CREA
TOPICAL_CREAM | CUTANEOUS | 3 refills | Status: DC
Start: 2023-03-15 — End: 2023-08-09

## 2023-03-15 NOTE — Progress Notes (Signed)
DISCONTINUE ON PATHWAY REGIMEN - Lymphoma and CLL     A cycle is every 28 days:     Bleomycin      Dacarbazine      Doxorubicin      Vinblastine   **Always confirm dose/schedule in your pharmacy ordering system**  REASON: Continuation Of Treatment PRIOR TREATMENT: [id number]: ABVD q28 Days x 2 Cycles Followed by PET-2 TREATMENT RESPONSE: Partial Response (PR)  Lymphoma and CLL - No Medical Intervention - Off Treatment.  Patient Characteristics: Classic Hodgkin Lymphoma, First Line, Stage I / II, Early Unfavorable with Bulky Mediastinal Disease (10 cm or > 1/3 Diameter of Chest) Disease Type: Not Applicable Disease Type: Not Applicable Disease Type: Classic Hodgkin Lymphoma Line of therapy: First Line First Line, Stage I/II Disease Characteristics: Early Unfavorable with Bulky Mediastinal Disease (10 cm or > 1/3 Diameter of Chest)

## 2023-03-15 NOTE — Progress Notes (Signed)
Advani RH, Bishop Limbo, Bartlett NL, et al. Brentuximab vedotin in combination with nivolumab in relapsed or refractory Hodgkin lymphoma: 3-year study results. Blood [phone]

## 2023-03-15 NOTE — Progress Notes (Signed)
DISCONTINUE ON PATHWAY REGIMEN - Lymphoma and CLL  No Medical Intervention - Off Treatment.  REASON: Disease Progression PRIOR TREATMENT: [id number]: Referral to Lymphoma Specialist  START OFF PATHWAY REGIMEN - Lymphoma and CLL   [id number]:Brentuximab vedotin 1.8 mg/kg IV D1 + Nivolumab 240 mg IV D8 (C1)/D1 (C2+) q21 Days:   Cycle 1: A cycle is every 21 days:     Brentuximab vedotin      Nivolumab    Cycles 2 and beyond: A cycle is every 21 days:     Brentuximab vedotin      Nivolumab   **Always confirm dose/schedule in your pharmacy ordering system**  Patient Characteristics: Classic Hodgkin Lymphoma, Second Line, Not a Transplant Candidate Disease Type: Not Applicable Disease Type: Not Applicable Disease Type: Classic Hodgkin Lymphoma Line of therapy: Second Line Patient Characteristics: Not a Transplant Candidate Intent of Therapy: Curative Intent, Discussed with Patient

## 2023-03-18 ENCOUNTER — Telehealth: Payer: Self-pay | Admitting: Hematology

## 2023-03-19 ENCOUNTER — Other Ambulatory Visit: Payer: Self-pay

## 2023-03-23 ENCOUNTER — Inpatient Hospital Stay: Payer: Medicare Other | Attending: Hematology | Admitting: Hematology

## 2023-03-23 ENCOUNTER — Other Ambulatory Visit: Payer: Self-pay

## 2023-03-23 ENCOUNTER — Other Ambulatory Visit: Payer: Medicare Other

## 2023-03-23 ENCOUNTER — Inpatient Hospital Stay: Payer: Medicare Other

## 2023-03-23 ENCOUNTER — Ambulatory Visit: Payer: Medicare Other | Admitting: Hematology

## 2023-03-23 VITALS — BP 98/64 | HR 81 | Temp 97.5°F | Resp 20 | Wt 247.6 lb

## 2023-03-23 DIAGNOSIS — Z5111 Encounter for antineoplastic chemotherapy: Secondary | ICD-10-CM

## 2023-03-23 DIAGNOSIS — Z5112 Encounter for antineoplastic immunotherapy: Secondary | ICD-10-CM | POA: Insufficient documentation

## 2023-03-23 DIAGNOSIS — Z79620 Long term (current) use of immunosuppressive biologic: Secondary | ICD-10-CM | POA: Insufficient documentation

## 2023-03-23 DIAGNOSIS — C8192 Hodgkin lymphoma, unspecified, intrathoracic lymph nodes: Secondary | ICD-10-CM

## 2023-03-23 DIAGNOSIS — C8102 Nodular lymphocyte predominant Hodgkin lymphoma, intrathoracic lymph nodes: Secondary | ICD-10-CM

## 2023-03-23 DIAGNOSIS — C8179 Other classical Hodgkin lymphoma, extranodal and solid organ sites: Secondary | ICD-10-CM | POA: Diagnosis present

## 2023-03-23 DIAGNOSIS — Z95828 Presence of other vascular implants and grafts: Secondary | ICD-10-CM

## 2023-03-23 LAB — CMP (CANCER CENTER ONLY)
ALT: 18 U/L (ref 0–44)
AST: 22 U/L (ref 15–41)
Albumin: 4 g/dL (ref 3.5–5.0)
Alkaline Phosphatase: 100 U/L (ref 38–126)
Anion gap: 5 (ref 5–15)
BUN: 14 mg/dL (ref 6–20)
CO2: 27 mmol/L (ref 22–32)
Calcium: 9.1 mg/dL (ref 8.9–10.3)
Chloride: 107 mmol/L (ref 98–111)
Creatinine: 0.8 mg/dL (ref 0.61–1.24)
GFR, Estimated: >60 mL/min (ref >60)
Glucose, Bld: 89 mg/dL (ref 70–99)
Potassium: 3.8 mmol/L (ref 3.5–5.1)
Sodium: 139 mmol/L (ref 135–145)
Total Bilirubin: 0.3 mg/dL (ref 0.3–1.2)
Total Protein: 6.5 g/dL (ref 6.5–8.1)

## 2023-03-23 LAB — CBC WITH DIFFERENTIAL (CANCER CENTER ONLY)
Abs Immature Granulocytes: 0.01 10*3/uL (ref 0.00–0.07)
Basophils Absolute: 0 10*3/uL (ref 0.0–0.1)
Basophils Relative: 1 %
Eosinophils Absolute: 1.2 10*3/uL — ABNORMAL HIGH (ref 0.0–0.5)
Eosinophils Relative: 20 %
HCT: 35.5 % — ABNORMAL LOW (ref 39.0–52.0)
Hemoglobin: 12 g/dL — ABNORMAL LOW (ref 13.0–17.0)
Immature Granulocytes: 0 %
Lymphocytes Relative: 26 %
Lymphs Abs: 1.5 10*3/uL (ref 0.7–4.0)
MCH: 28.8 pg (ref 26.0–34.0)
MCHC: 33.8 g/dL (ref 30.0–36.0)
MCV: 85.3 fL (ref 80.0–100.0)
Monocytes Absolute: 0.5 10*3/uL (ref 0.1–1.0)
Monocytes Relative: 9 %
Neutro Abs: 2.6 10*3/uL (ref 1.7–7.7)
Neutrophils Relative %: 44 %
Platelet Count: 196 10*3/uL (ref 150–400)
RBC: 4.16 MIL/uL — ABNORMAL LOW (ref 4.22–5.81)
RDW: 15 % (ref 11.5–15.5)
WBC Count: 5.8 10*3/uL (ref 4.0–10.5)
nRBC: 0 % (ref 0.0–0.2)

## 2023-03-23 MED ORDER — SODIUM CHLORIDE 0.9% FLUSH
10.0000 mL | Freq: Once | INTRAVENOUS | Status: AC
  Administered 2023-03-23: 10 mL

## 2023-03-23 MED ORDER — HEPARIN SOD (PORK) LOCK FLUSH 100 UNIT/ML IV SOLN
500.0000 [IU] | Freq: Once | INTRAVENOUS | Status: AC
  Administered 2023-03-23: 500 [IU]

## 2023-03-23 NOTE — Progress Notes (Signed)
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 male who has been referred to [place] by .Lazoff, Shawn P, DO and Dr. Clydell Hakim MD for evaluation and management of newly diagnosed mediastinal mass concerning for likely lymphoma.  Patient has a history of fetal alcohol syndrome with developmental delay, narcolepsy, ADD, exercise-induced asthma and lives with his adoptive parents. He recently presented to the hospital on 07/01/2022 with 2-week history of worsening shortness of breath cough and wheezing.  He received his asthma treatment but was still noted to have significant cough.  In the emergency room he had a chest x-ray which showed an mediastinal mass. Subsequent CT chest with contrast on 07/01/2022 showed extensive mediastinal and hilar lymphadenopathy with conglomerate soft tissue mass/adenopathy within the anterior mediastinum measuring 11.2 x 7.9 x 13 cm. Compression of the left brachiocephalic and SVC within the mid though these vascular structures remain patent.  Patient did not have any clinical signs or symptoms of SVC compression syndrome or left upper extremity swelling.  Patient subsequently had a CT of the abdomen and pelvis which showed no acute intra-abdominal or intrapelvic abnormalities. He underwent a CT-guided core needle biopsy of the mediastinal mass by interventional radiology.  The official pathology results from his biopsy are not currently available at the time of this clinic visit. I did call and talk to the pathologist Dr. Canova Callas and she notes that this either looks like a lymphoma or thymoma and she is running additional tests to make a final  diagnosis.  Patient's father accompanied him for this visit and his mother who helps make most of the decisions in tandem with his father was present on the phone.  They do not note any unexplained fevers chills night sweats or significant weight loss.  His breathing has been relatively stable since hospital discharge but he is still having some cough.  I confirmed adequacy of tissue sampling with the pathologist and the patient was then started on prednisone to try to help his cough by drinking his mediastinal tumor some and reducing bronchial inflammation.  Patient is unable to provide much information or review of systems on account of his developmental delay issues.  Interval History:   Paul [name] is a wonderful 32 y.o. male who is here for continued evaluation and management of classical hogkins lymphoma.   Patient was last seen by me on 03/01/2023 and complained of eye pain, itchiness, bulging, and discoloration.   Today, Paul [name] notes some rt triceps muscle strain. No fevers/chills/nightsweats. Had another GOC discussion with his parents who note that they went to [facility] and have decided not to pursue AUto HSCT. Discussed Adcentris + Nivolumab treatment consideration in details.   MEDICAL HISTORY:  Past Medical History:  Diagnosis Date   ADD (attention deficit disorder)    ADHD (attention deficit hyperactivity disorder)    Asthma    excercise induced and pollen   Auditory processing disorder    Complication of anesthesia    mother states pt. is harder to put under anes. and hard to wake up due to fetal alcohol syndrome; can be combative, per mother; states he will do better if mother is in PACU when he is coming out of anes.  Development delay    states mental status of a 32 year old   Exercise-induced asthma    prn inhaler/neb.   Fetal alcohol syndrome    Narcolepsy    Non-restorable tooth 09/2015   teeth   Separation anxiety    Twin birth     SURGICAL  HISTORY: Past Surgical History:  Procedure Laterality Date   IR IMAGING GUIDED PORT INSERTION  08/11/2022   MULTIPLE EXTRACTIONS WITH ALVEOLOPLASTY N/A 09/23/2015   Procedure: MULTIPLE EXTRACTION WITH ALVEOLOPLASTY;  Surgeon: Ocie Doyne, DDS;  Location: [facility];  Service: Oral Surgery;  Laterality: N/A;   PYLOROMYOTOMY     age [age], performed found to not have stenosis on surgical exam   PYLOROMYOTOMY     did not have pyloric stenosis; did surgery on the wrong twin   RADIOLOGY WITH ANESTHESIA N/A 01/16/2020   Procedure: MRI WITH ANESTHESIA  BRAIN WITH AND WITHOUT CONTRAST;  Surgeon: Radiologist, Medication, MD;  Location: [facility] OR;  Service: Radiology;  Laterality: N/A;    SOCIAL HISTORY: Social History   Socioeconomic History   Marital status: Single    Spouse name: Not on file   Number of children: Not on file   Years of education: Not on file   Highest education level: Not on file  Occupational History   Not on file  Tobacco Use   Smoking status: Never   Smokeless tobacco: Never  Vaping Use   Vaping status: Never Used  Substance and Sexual Activity   Alcohol use: No   Drug use: No   Sexual activity: Not on file  Other Topics Concern   Not on file  Social History Narrative   ** Merged History Encounter **       Social Determinants of Health   Financial Resource Strain: Low Risk  (04/09/2021)   Received from [facility] visits prior to 11/07/2022.   Overall Financial Resource Strain (CARDIA)    Difficulty of Paying Living Expenses: Not hard at all  Food Insecurity: Low Risk  (03/17/2023)   Received from [facility]   Food vital sign    Within the past 12 months, you worried that your food would run out before you got money to buy more: Never true    Within the past 12 months, the food you bought just didn't last and you didn't have money to get more. : Never true  Transportation Needs: Not on file (03/17/2023)  Physical Activity:  Insufficiently Active (04/09/2021)   Received from [facility] visits prior to 11/07/2022.   Exercise Vital Sign    Days of Exercise per Week: 5 days    Minutes of Exercise per Session: 10 min  Stress: No Stress Concern Present (04/09/2021)   Received from [facility] visits prior to 11/07/2022.   [employer] of Occupational Health - Occupational Stress Questionnaire    Feeling of Stress : Only a little  Social Connections: Socially Isolated (04/09/2021)   Received from [facility] visits prior to 11/07/2022.   Social Advertising account executive [NHANES]    Frequency of Communication with Friends and Family: Never    Frequency of Social Gatherings with Friends and Family: Once a week    Attends Religious Services: Never    Database administrator or Organizations: Yes    Attends Engineer, structural: More than 4 times per year    Marital Status: Never married  Intimate Partner Violence:  Not At Risk (04/09/2021)   Received from [facility] visits prior to 11/07/2022.   Humiliation, Afraid, Rape, and Kick questionnaire    Fear of Current or Ex-Partner: No    Emotionally Abused: No    Physically Abused: No    Sexually Abused: No    FAMILY HISTORY: Family History  Adopted: Yes  Problem Relation Age of Onset   Mental illness Mother    Diabetes Mother    Hypertension Mother    Schizophrenia Mother    Bipolar disorder Mother    Mental retardation Father    Sleep apnea Neg Hx     ALLERGIES:  is allergic to other.  MEDICATIONS:  Current Outpatient Medications  Medication Sig Dispense Refill   albuterol (PROVENTIL HFA;VENTOLIN HFA) 108 (90 BASE) MCG/ACT inhaler Inhale into the lungs every 6 (six) hours as needed for wheezing or shortness of breath.     albuterol (PROVENTIL) (5 MG/ML) 0.5% nebulizer solution Take 2.5 mg by nebulization every 6 (six) hours as needed for wheezing or shortness of  breath.     Cholecalciferol (VITAMIN D3) 250 MCG (10000 UT) capsule Take 10,000 Units by mouth daily.     dexamethasone (DECADRON) 4 MG tablet Take 2 tablets by mouth once a day for 3 days after chemotherapy. Take with food. 30 tablet 1   lidocaine-prilocaine (EMLA) cream Apply to affected area once 30 g 3   loratadine (CLARITIN) 10 MG tablet Take 10 mg by mouth daily.     mometasone-formoterol (DULERA) 200-5 MCG/ACT AERO Inhale 2 puffs into the lungs 2 (two) times daily. 8.8 g 0   Multiple Vitamin (MULTIVITAMIN WITH MINERALS) TABS tablet Take 1 tablet by mouth daily.     olopatadine (PATADAY) 0.1 % ophthalmic solution Place 1 drop into both eyes 2 (two) times daily. 5 mL 2   ondansetron (ZOFRAN) 4 MG tablet Take 1 tablet (4 mg total) by mouth every 6 (six) hours as needed for nausea. 20 tablet 0   prochlorperazine (COMPAZINE) 10 MG tablet Take 1 tablet (10 mg total) by mouth every 6 (six) hours as needed for nausea or vomiting. 30 tablet 1   triamcinolone (KENALOG) 0.025 % ointment Apply 1 Application topically 2 (two) times daily. To rash on hands 30 g 0   urea (CARMOL) 10 % cream Apply topically 2 (two) times daily. To hands and feet 453 g 1   No current facility-administered medications for this visit.    REVIEW OF SYSTEMS:    10 Point review of Systems was done is negative except as noted above.   PHYSICAL EXAMINATION  BP 98/64   Pulse 81   Temp (!) 97.5 F (36.4 C)   Resp 20   Wt 247 lb 9.6 oz (112.3 kg)   SpO2 96%   BMI 35.53 kg/m  GENERAL:alert, in no acute distress and comfortable SKIN: no acute rashes, no significant lesions EYES: conjunctiva are pink and non-injected, sclera anicteric OROPHARYNX: MMM, no exudates, no oropharyngeal erythema or ulceration NECK: supple, no JVD LYMPH:  no palpable lymphadenopathy in the cervical, axillary or inguinal regions LUNGS: clear to auscultation b/l with normal respiratory effort HEART: regular rate & rhythm ABDOMEN:  normoactive  bowel sounds , non tender, not distended. No splenomegaly. Extremity: no pedal edema PSYCH: alert & oriented x 3 with fluent speech NEURO: no focal motor/sensory deficits   LABORATORY DATA:  I have reviewed the data as listed  .    Latest Ref Rng & Units 03/01/2023  1:37 PM 02/05/2023    2:11 PM 02/03/2023   12:19 PM  CBC  WBC 4.0 - 10.5 K/uL 4.8  12.3  1.6   Hemoglobin 13.0 - 17.0 g/dL 16.1  09.6  04.5   Hematocrit 39.0 - 52.0 % 36.2  35.1  36.4   Platelets 150 - 400 K/uL 243  154  182    ANC 300  .    Latest Ref Rng & Units 03/01/2023    1:37 PM 02/03/2023   12:19 PM 01/20/2023    1:22 PM  CMP  Glucose 70 - 99 mg/dL 409  811  914   BUN 6 - 20 mg/dL 13  8  13    Creatinine 0.61 - 1.24 mg/dL 7.82  9.56  2.13   Sodium 135 - 145 mmol/L 138  139  138   Potassium 3.5 - 5.1 mmol/L 3.8  3.2  3.9   Chloride 98 - 111 mmol/L 105  103  104   CO2 22 - 32 mmol/L 28  28  29    Calcium 8.9 - 10.3 mg/dL 9.3  8.9  8.9   Total Protein 6.5 - 8.1 g/dL 6.2  6.7  6.5   Total Bilirubin 0.3 - 1.2 mg/dL 0.2  0.3  0.2   Alkaline Phos 38 - 126 U/L 93  101  96   AST 15 - 41 U/L 23  21  20    ALT 0 - 44 U/L 21  23  28     . Lab Results  Component Value Date   LDH 171 07/03/2022   RADIOGRAPHIC STUDIES: I have personally reviewed the radiological images as listed and agreed with the findings in the report. NM PET Image Restag (PS) Skull Base To Thigh  Result Date: 03/01/2023 CLINICAL DATA:  Subsequent treatment strategy for Hodgkin's lymphoma. Status post chemotherapy. EXAM: NUCLEAR MEDICINE PET SKULL BASE TO THIGH TECHNIQUE: 11.9 mCi F-18 FDG was injected intravenously. Full-ring PET imaging was performed from the skull base to thigh after the radiotracer. CT data was obtained and used for attenuation correction and anatomic localization. Fasting blood glucose: 102 mg/dl COMPARISON:  08/65/7846, FDG PET scan, 07/22/2022 FDG PET scan FINDINGS: Mediastinal blood pool activity: SUV max 170 Liver  activity: SUV max NA NECK: No hypermetabolic lymph nodes in the neck. Incidental CT findings: Port in the anterior chest wall with tip in distal SVC. CHEST: Thickened tissue in the RIGHT aspect of the anterior mediastinum (ventral to the SVC) has new intense metabolic activity SUV max equal 9.7. This compares to thickened tissue on comparison PET-CT scan with SUV max equal 3.8. This is site of prior hypermetabolic nodular tissue on PET-CT scan November 2023. The thickened tissue is increased in size additionally measuring 4.1 cm (image 75/4) compared to 2.8 cm on most recent FDG PET scan. Two small foci of significant metabolic activity are present in the LEFT upper anterior mediastinum with SUV max equal 7.5 on image 62. No axillary hypermetabolic adenopathy. No supraclavicular adenopathy. Incidental CT findings: None. ABDOMEN/PELVIS: Choose one Spleen normal size and normal metabolic activity. Incidental CT findings: None. SKELETON: No focal hypermetabolic activity to suggest skeletal metastasis. Incidental CT findings: None. IMPRESSION: 1. Unfortunately, there is interval increase in size and metabolic activity of RIGHT anterior mediastinal mass consistent with lymphoma recurrence/transformation. [place] 5 2. Two small new hypermetabolic foci in the superior LEFT anterior mediastinum also consistent with recurrence/transformation. 3. No evidence of metastatic disease outside of the thorax. 4. Normal spleen and marrow. Electronically Signed  By: Genevive Bi M.D.   On: 03/01/2023 14:46    Surgical Pathology result from 07/03/2022: FINAL MICROSCOPIC DIAGNOSIS:  A. MEDIASTINAL MASS, ANTERIOR, NEEDLE CORE BIOPSY: -Atypical lymphoid proliferation consistent with classical Hodgkin lymphoma -See comment  COMMENT:  The sections show needle core biopsy fragments displaying a nodular and diffuse lymphoid proliferation associated with dense sclerosis.  The lymphoid process shows predominance of small  lymphoid cells admixed to a lesser extent with histiocytes, eosinophils in addition to the large atypical mononuclear and occasionally lobated lymphoid appearing cells with variably prominent nucleoli.  Flow cytometric analysis was performed [facility] [phone]) and shows predominance of CD4-positive T cells. No monoclonal B-cell population identified.  In addition, a battery of immunohistochemical stains was performed including CD30, CD15, mum 1, LCA, CD20, PAX5, CD3, CD5, CD4, CD8, TdT, cytokeratin AE1/AE3, cytokeratin 8/18 and EBV in situ hybridization with appropriate controls.  The large atypical lymphoid appearing cells are positive for CD15, CD30, PAX5, mum 1 and rare cells for CD20.  No significant staining is seen with LCA, EBV, CD3, CD5, CD4, CD8.  Cytokeratin stains highlight a small focus of positivity likely representing native epithelial elements.  No significant TdT positivity is identified.  The small lymphocytes in the background show a mixture of T and B cells with predominance of T cells.  The latter show predominance of CD4 positive cells.  Overall, the features are atypical and most consistent with classical Hodgkin lymphoma which is best subclassified as nodular sclerosis type.   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 The mass was causing some compression of his SVC and left brachiocephalic vein but no complete obstruction or symptoms related to this. No constitutional symptoms.  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  PLAN:  -Discussed lab results on 03/23/2023 in detail with patient. CBC showed WBC of 5.8K, hemoglobin of 12.0, and platelets of 196K. -PET/CT  from 02/2023 showed progression/persistence of Hodgkins lymphoma. -patient was [facility] for [facility] consideration and patients parents declined consideration of AutoHSCT since [place] would not have home support to handle this treatment. =-we discussed proceeding with 2nd line Adcentris+Nivolumab and patient and his parents are agreeable with this answered all of patient's father's and mothers (on phone) questions in detail -Continue B-complex supplement -pet/ct after 3 cycles of treatment.  FOLLOW-UP: F/u with C1 , C2 and C3 of treatment per integrated scheduling.   The total time spent in the appointment was 30 minutes* .  All of the patient's questions were answered with apparent satisfaction. The patient knows to call the clinic with any problems, questions or concerns.   Wyvonnia Lora MD MS AAHIVMS [facility] Hematology/Oncology Physician [facility]  .*Total Encounter Time as defined by the Centers for Medicare and Medicaid Services includes, in addition to the face-to-face time of a patient visit (documented in the note above) non-face-to-face time: obtaining and reviewing outside history, ordering and reviewing medications, tests or procedures, care coordination (communications with other health care professionals or caregivers) and documentation in the medical record.    I,Mitra Faeizi,acting as a Neurosurgeon for Wyvonnia Lora, MD.,have documented all relevant documentation on the behalf of Wyvonnia Lora, MD,as directed by  Wyvonnia Lora, MD while in  the presence of Wyvonnia Lora, MD.  .I have reviewed the above documentation for accuracy and completeness, and I agree with the above. Johney Maine MD

## 2023-03-24 ENCOUNTER — Inpatient Hospital Stay: Payer: Medicare Other

## 2023-03-24 ENCOUNTER — Ambulatory Visit: Payer: Medicare Other

## 2023-03-24 VITALS — BP 108/74 | HR 86 | Temp 98.7°F | Resp 18

## 2023-03-24 DIAGNOSIS — C8192 Hodgkin lymphoma, unspecified, intrathoracic lymph nodes: Secondary | ICD-10-CM

## 2023-03-24 DIAGNOSIS — Z5112 Encounter for antineoplastic immunotherapy: Secondary | ICD-10-CM | POA: Diagnosis not present

## 2023-03-24 MED ORDER — SODIUM CHLORIDE 0.9 % IV SOLN
300.0000 mg | Freq: Once | INTRAVENOUS | Status: AC
  Administered 2023-03-24: 300 mg via INTRAVENOUS
  Filled 2023-03-24: qty 30

## 2023-03-24 MED ORDER — SODIUM CHLORIDE 0.9% FLUSH
10.0000 mL | INTRAVENOUS | Status: DC | PRN
  Administered 2023-03-24: 10 mL

## 2023-03-24 MED ORDER — SODIUM CHLORIDE 0.9 % IV SOLN
10.0000 mg | Freq: Once | INTRAVENOUS | Status: AC
  Administered 2023-03-24: 10 mg via INTRAVENOUS
  Filled 2023-03-24: qty 10

## 2023-03-24 MED ORDER — SODIUM CHLORIDE 0.9 % IV SOLN
180.0000 mg | Freq: Once | INTRAVENOUS | Status: AC
  Administered 2023-03-24: 180 mg via INTRAVENOUS
  Filled 2023-03-24: qty 36

## 2023-03-24 MED ORDER — ACETAMINOPHEN 325 MG PO TABS
650.0000 mg | ORAL_TABLET | Freq: Once | ORAL | Status: AC
  Administered 2023-03-24: 650 mg via ORAL
  Filled 2023-03-24: qty 2

## 2023-03-24 MED ORDER — DIPHENHYDRAMINE HCL 50 MG/ML IJ SOLN
50.0000 mg | Freq: Once | INTRAMUSCULAR | Status: AC
  Administered 2023-03-24: 50 mg via INTRAVENOUS
  Filled 2023-03-24: qty 1

## 2023-03-24 MED ORDER — HEPARIN SOD (PORK) LOCK FLUSH 100 UNIT/ML IV SOLN
500.0000 [IU] | Freq: Once | INTRAVENOUS | Status: AC | PRN
  Administered 2023-03-24: 500 [IU]

## 2023-03-24 MED ORDER — SODIUM CHLORIDE 0.9 % IV SOLN: Freq: Once | INTRAVENOUS | Status: AC

## 2023-03-24 NOTE — Patient Instructions (Signed)
[facility]  Discharge Instructions: Thank you for choosing [facility] to provide your oncology and hematology care.   If you have a lab appointment with the Cancer Center, please go directly to the Cancer Center and check in at the registration area.   Wear comfortable clothing and clothing appropriate for easy access to any Portacath or PICC line.   We strive to give you quality time with your provider. You may need to reschedule your appointment if you arrive late (15 or more minutes).  Arriving late affects you and other patients whose appointments are after yours.  Also, if you miss three or more appointments without notifying the office, you may be dismissed from the clinic at the provider's discretion.      For prescription refill requests, have your pharmacy contact our office and allow 72 hours for refills to be completed.    Today you received the following chemotherapy and/or immunotherapy agents: (Adcentris) Brentuximab vendotin, (Opdivo) Nivolumab      To help prevent nausea and vomiting after your treatment, we encourage you to take your nausea medication as directed.  BELOW ARE SYMPTOMS THAT SHOULD BE REPORTED IMMEDIATELY: *FEVER GREATER THAN 100.4 F (38 C) OR HIGHER *CHILLS OR SWEATING *NAUSEA AND VOMITING THAT IS NOT CONTROLLED WITH YOUR NAUSEA MEDICATION *UNUSUAL SHORTNESS OF BREATH *UNUSUAL BRUISING OR BLEEDING *URINARY PROBLEMS (pain or burning when urinating, or frequent urination) *BOWEL PROBLEMS (unusual diarrhea, constipation, pain near the anus) TENDERNESS IN MOUTH AND THROAT WITH OR WITHOUT PRESENCE OF ULCERS (sore throat, sores in mouth, or a toothache) UNUSUAL RASH, SWELLING OR PAIN  UNUSUAL VAGINAL DISCHARGE OR ITCHING   Items with * indicate a potential emergency and should be followed up as soon as possible or go to the Emergency Department if any problems should occur.  Please show the CHEMOTHERAPY  ALERT CARD or IMMUNOTHERAPY ALERT CARD at check-in to the Emergency Department and triage nurse.  Should you have questions after your visit or need to cancel or reschedule your appointment, please contact [facility]  Dept: [phone]  and follow the prompts.  Office hours are 8:00 a.m. to 4:30 p.m. Monday - Friday. Please note that voicemails left after 4:00 p.m. may not be returned until the following business day.  We are closed weekends and major holidays. You have access to a nurse at all times for urgent questions. Please call the main number to the clinic Dept: [phone] and follow the prompts.   For any non-urgent questions, you may also contact your provider using MyChart. We now offer e-Visits for anyone 42 and older to request care online for non-urgent symptoms. For details visit [email or web address].   Also download the MyChart app! Go to the app store, search "MyChart", open the app, select [facility], and log in with your MyChart username and password.  Brentuximab Vedotin Injection What is this medication? BRENTUXIMAB VEDOTIN (bren TUX see mab ve DOE tin) treats lymphoma. It works by blocking a protein that causes cancer cells to grow and multiply. This helps to slow or stop the spread of cancer cells. This medicine may be used for other purposes; ask your health care provider or pharmacist if you have questions. COMMON BRAND NAME(S): ADCETRIS What should I tell my care team before I take this medication? They need to know if you have any of these conditions: Diabetes Kidney disease Liver disease Low white blood cell levels Lung disease Tingling  of the fingers or toes or other nerve disorder An unusual or allergic reaction to brentuximab vedotin, other medications, foods, dyes, or preservatives Pregnant or trying to get pregnant Breast-feeding How should I use this medication? This medication is injected into a vein. It is given  by your care team in a hospital or clinic setting. Talk to your care team about the use of this medication in children. While it may be given to children as young as 2 years for selected conditions, precautions do apply. Overdosage: If you think you have taken too much of this medicine contact a poison control center or emergency room at once. NOTE: This medicine is only for you. Do not share this medicine with others. What if I miss a dose? Keep appointments for follow-up doses. It is important not to miss your dose. Call your care team if you are unable to keep an appointment. What may interact with this medication? Do not take this medication with any of the following: Bleomycin This medication may also interact with the following: Ketoconazole Rifampin St. John's Wort This list may not describe all possible interactions. Give your health care provider a list of all the medicines, herbs, non-prescription drugs, or dietary supplements you use. Also tell them if you smoke, drink alcohol, or use illegal drugs. Some items may interact with your medicine. What should I watch for while using this medication? Your condition will be monitored carefully while you are receiving this medication. You may need blood work while taking this medication. This medication may increase your risk of getting an infection. Call your care team for advice if you get a fever, chills, sore throat, or other symptoms of a cold or flu. Do not treat yourself. Try to avoid being around people who are sick. In some patients, this medication may cause a serious brain infection that may cause death. If you have any problems seeing, thinking, speaking, walking, or standing, tell your care team right away. If you cannot reach your care team, urgently seek other source of medical care. This medication may increase blood sugar. The risk may be higher in patients who already have diabetes. Ask your care team what you can do to lower  your risk of diabetes while taking this medication. Talk to your care team if you or your partner may be pregnant. You will need a negative pregnancy test before starting this medication. Contraception is recommended while taking this medication and for 2 months after the last dose. Your care team can help you find the option that works for you. Use a condom during sex and for 4 months after stopping therapy. Tell your care team right away if you think your partner might be pregnant. Do not breast-feed while taking this medication. This medication may cause infertility. Talk to your care team if you are concerned about your fertility. What side effects may I notice from receiving this medication? Side effects that you should report to your care team as soon as possible: Allergic reactions--skin rash, itching, hives, swelling of the face, lips, tongue, or throat Dizziness, loss of balance or coordination, confusion or trouble speaking High blood sugar (hyperglycemia)--increased thirst or amount of urine, unusual weakness or fatigue, blurry vision Infection--fever, chills, cough, sore throat, wounds that don't heal, pain or trouble when passing urine, general feeling of discomfort or being unwell Liver injury--right upper belly pain, loss of appetite, nausea, light-colored stool, dark yellow or brown urine, yellowing skin or eyes, unusual weakness or fatigue Low red  blood cell level--unusual weakness or fatigue, dizziness, headache, trouble breathing Lung injury--shortness of breath or trouble breathing, cough, spitting up blood, chest pain, fever Pain, tingling, or numbness in the hands or feet Pancreatitis--severe stomach pain that spreads to your back or gets worse after eating or when touched, fever, nausea, vomiting Redness, blistering, peeling, or loosening of the skin, including inside the mouth Stomach bleeding--bloody or black, tar-like stools, vomiting blood or brown material that looks like  coffee grounds Sudden or severe stomach pain, bloody diarrhea, fever, nausea, vomiting Tumor lysis syndrome (TLS)--nausea, vomiting, diarrhea, decrease in the amount of urine, dark urine, unusual weakness or fatigue, confusion, muscle pain or cramps, fast or irregular heartbeat, joint pain Unusual bruising or bleeding Side effects that usually do not require medical attention (report to your care team if they continue or are bothersome): Cough Diarrhea Fatigue Joint pain Nausea Stomach pain Vomiting This list may not describe all possible side effects. Call your doctor for medical advice about side effects. You may report side effects to FDA at 1-800-FDA-1088. Where should I keep my medication? This medication is given in a hospital or clinic. It will not be stored at home. NOTE: This sheet is a summary. It may not cover all possible information. If you have questions about this medicine, talk to your doctor, pharmacist, or health care provider.  2024 Elsevier/Gold Standard (2022-02-27 00:00:00)  Nivolumab Injection What is this medication? NIVOLUMAB (nye VOL ue mab) treats some types of cancer. It works by helping your immune system slow or stop the spread of cancer cells. It is a monoclonal antibody. This medicine may be used for other purposes; ask your health care provider or pharmacist if you have questions. COMMON BRAND NAME(S): Opdivo What should I tell my care team before I take this medication? They need to know if you have any of these conditions: Allogeneic stem cell transplant (uses someone else's stem cells) Autoimmune diseases, such as Crohn disease, ulcerative colitis, lupus History of chest radiation Nervous system problems, such as Guillain-Barre syndrome or myasthenia gravis Organ transplant An unusual or allergic reaction to nivolumab, other medications, foods, dyes, or preservatives Pregnant or trying to get pregnant Breast-feeding How should I use this  medication? This medication is infused into a vein. It is given in a hospital or clinic setting. A special MedGuide will be given to you before each treatment. Be sure to read this information carefully each time. Talk to your care team about the use of this medication in children. While it may be prescribed for children as young as 12 years for selected conditions, precautions do apply. Overdosage: If you think you have taken too much of this medicine contact a poison control center or emergency room at once. NOTE: This medicine is only for you. Do not share this medicine with others. What if I miss a dose? Keep appointments for follow-up doses. It is important not to miss your dose. Call your care team if you are unable to keep an appointment. What may interact with this medication? Interactions have not been studied. This list may not describe all possible interactions. Give your health care provider a list of all the medicines, herbs, non-prescription drugs, or dietary supplements you use. Also tell them if you smoke, drink alcohol, or use illegal drugs. Some items may interact with your medicine. What should I watch for while using this medication? Your condition will be monitored carefully while you are receiving this medication. You may need blood work while taking  this medication. This medication may cause serious skin reactions. They can happen weeks to months after starting the medication. Contact your care team right away if you notice fevers or flu-like symptoms with a rash. The rash may be red or purple and then turn into blisters or peeling of the skin. You may also notice a red rash with swelling of the face, lips, or lymph nodes in your neck or under your arms. Tell your care team right away if you have any change in your eyesight. Talk to your care team if you are pregnant or think you might be pregnant. A negative pregnancy test is required before starting this medication. A reliable  form of contraception is recommended while taking this medication and for 5 months after the last dose. Talk to your care team about effective forms of contraception. Do not breast-feed while taking this medication and for 5 months after the last dose. What side effects may I notice from receiving this medication? Side effects that you should report to your care team as soon as possible: Allergic reactions--skin rash, itching, hives, swelling of the face, lips, tongue, or throat Dry cough, shortness of breath or trouble breathing Eye pain, redness, irritation, or discharge with blurry or decreased vision Heart muscle inflammation--unusual weakness or fatigue, shortness of breath, chest pain, fast or irregular heartbeat, dizziness, swelling of the ankles, feet, or hands Hormone gland problems--headache, sensitivity to light, unusual weakness or fatigue, dizziness, fast or irregular heartbeat, increased sensitivity to cold or heat, excessive sweating, constipation, hair loss, increased thirst or amount of urine, tremors or shaking, irritability Infusion reactions--chest pain, shortness of breath or trouble breathing, feeling faint or lightheaded Kidney injury (glomerulonephritis)--decrease in the amount of urine, red or dark brown urine, foamy or bubbly urine, swelling of the ankles, hands, or feet Liver injury--right upper belly pain, loss of appetite, nausea, light-colored stool, dark yellow or brown urine, yellowing skin or eyes, unusual weakness or fatigue Pain, tingling, or numbness in the hands or feet, muscle weakness, change in vision, confusion or trouble speaking, loss of balance or coordination, trouble walking, seizures Rash, fever, and swollen lymph nodes Redness, blistering, peeling, or loosening of the skin, including inside the mouth Sudden or severe stomach pain, bloody diarrhea, fever, nausea, vomiting Side effects that usually do not require medical attention (report these to your  care team if they continue or are bothersome): Bone, joint, or muscle pain Diarrhea Fatigue Loss of appetite Nausea Skin rash This list may not describe all possible side effects. Call your doctor for medical advice about side effects. You may report side effects to FDA at 1-800-FDA-1088. Where should I keep my medication? This medication is given in a hospital or clinic. It will not be stored at home. NOTE: This sheet is a summary. It may not cover all possible information. If you have questions about this medicine, talk to your doctor, pharmacist, or health care provider.  2024 Elsevier/Gold Standard (2021-12-22 00:00:00)

## 2023-03-24 NOTE — Progress Notes (Signed)
Spoke w/ Dr. Candise Che and ok to give opdivo 300 mg to reduce wasting of vial of opdivo.  Demetrius Charity, PharmD

## 2023-03-25 ENCOUNTER — Other Ambulatory Visit: Payer: Self-pay

## 2023-03-30 ENCOUNTER — Encounter: Payer: Self-pay | Admitting: Hematology

## 2023-04-12 MED FILL — Dexamethasone Sodium Phosphate Inj 100 MG/10ML: INTRAMUSCULAR | Qty: 1 | Status: AC

## 2023-04-13 ENCOUNTER — Inpatient Hospital Stay: Payer: Medicare HMO | Attending: Hematology | Admitting: Physician Assistant

## 2023-04-13 ENCOUNTER — Inpatient Hospital Stay: Payer: Medicare HMO

## 2023-04-13 ENCOUNTER — Ambulatory Visit: Payer: Medicare Other

## 2023-04-13 VITALS — BP 117/82 | HR 96 | Temp 99.3°F | Resp 17 | Wt 250.0 lb

## 2023-04-13 VITALS — BP 114/78 | HR 95 | Temp 98.1°F | Resp 16

## 2023-04-13 DIAGNOSIS — C8192 Hodgkin lymphoma, unspecified, intrathoracic lymph nodes: Secondary | ICD-10-CM

## 2023-04-13 DIAGNOSIS — Z79620 Long term (current) use of immunosuppressive biologic: Secondary | ICD-10-CM | POA: Diagnosis not present

## 2023-04-13 DIAGNOSIS — Z5112 Encounter for antineoplastic immunotherapy: Secondary | ICD-10-CM | POA: Insufficient documentation

## 2023-04-13 DIAGNOSIS — C8179 Other classical Hodgkin lymphoma, extranodal and solid organ sites: Secondary | ICD-10-CM | POA: Diagnosis present

## 2023-04-13 DIAGNOSIS — Z95828 Presence of other vascular implants and grafts: Secondary | ICD-10-CM

## 2023-04-13 DIAGNOSIS — C8102 Nodular lymphocyte predominant Hodgkin lymphoma, intrathoracic lymph nodes: Secondary | ICD-10-CM

## 2023-04-13 LAB — CBC WITH DIFFERENTIAL (CANCER CENTER ONLY)
Abs Immature Granulocytes: 0.01 10*3/uL (ref 0.00–0.07)
Basophils Absolute: 0 10*3/uL (ref 0.0–0.1)
Basophils Relative: 1 %
Eosinophils Absolute: 0.1 10*3/uL (ref 0.0–0.5)
Eosinophils Relative: 3 %
HCT: 36.1 % — ABNORMAL LOW (ref 39.0–52.0)
Hemoglobin: 12.3 g/dL — ABNORMAL LOW (ref 13.0–17.0)
Immature Granulocytes: 0 %
Lymphocytes Relative: 29 %
Lymphs Abs: 1.5 10*3/uL (ref 0.7–4.0)
MCH: 28.7 pg (ref 26.0–34.0)
MCHC: 34.1 g/dL (ref 30.0–36.0)
MCV: 84.3 fL (ref 80.0–100.0)
Monocytes Absolute: 1.2 10*3/uL — ABNORMAL HIGH (ref 0.1–1.0)
Monocytes Relative: 22 %
Neutro Abs: 2.4 10*3/uL (ref 1.7–7.7)
Neutrophils Relative %: 45 %
Platelet Count: 224 10*3/uL (ref 150–400)
RBC: 4.28 MIL/uL (ref 4.22–5.81)
RDW: 14.5 % (ref 11.5–15.5)
WBC Count: 5.3 10*3/uL (ref 4.0–10.5)
nRBC: 0 % (ref 0.0–0.2)

## 2023-04-13 LAB — CMP (CANCER CENTER ONLY)
ALT: 44 U/L (ref 0–44)
AST: 26 U/L (ref 15–41)
Albumin: 3.9 g/dL (ref 3.5–5.0)
Alkaline Phosphatase: 108 U/L (ref 38–126)
Anion gap: 6 (ref 5–15)
BUN: 8 mg/dL (ref 6–20)
CO2: 27 mmol/L (ref 22–32)
Calcium: 8.7 mg/dL — ABNORMAL LOW (ref 8.9–10.3)
Chloride: 103 mmol/L (ref 98–111)
Creatinine: 0.85 mg/dL (ref 0.61–1.24)
GFR, Estimated: >60 mL/min (ref >60)
Glucose, Bld: 96 mg/dL (ref 70–99)
Potassium: 3.6 mmol/L (ref 3.5–5.1)
Sodium: 136 mmol/L (ref 135–145)
Total Bilirubin: 0.4 mg/dL (ref 0.3–1.2)
Total Protein: 7.1 g/dL (ref 6.5–8.1)

## 2023-04-13 MED ORDER — HEPARIN SOD (PORK) LOCK FLUSH 100 UNIT/ML IV SOLN
500.0000 [IU] | Freq: Once | INTRAVENOUS | Status: AC | PRN
  Administered 2023-04-13: 500 [IU]

## 2023-04-13 MED ORDER — SODIUM CHLORIDE 0.9 % IV SOLN
340.0000 mg | Freq: Once | INTRAVENOUS | Status: AC
  Administered 2023-04-13: 340 mg via INTRAVENOUS
  Filled 2023-04-13: qty 10

## 2023-04-13 MED ORDER — SODIUM CHLORIDE 0.9% FLUSH
10.0000 mL | INTRAVENOUS | Status: DC | PRN
  Administered 2023-04-13: 10 mL

## 2023-04-13 MED ORDER — ACETAMINOPHEN 325 MG PO TABS: 650.0000 mg | ORAL_TABLET | Freq: Once | ORAL | Status: DC

## 2023-04-13 MED ORDER — SODIUM CHLORIDE 0.9% FLUSH
10.0000 mL | Freq: Once | INTRAVENOUS | Status: AC
  Administered 2023-04-13: 10 mL

## 2023-04-13 MED ORDER — SODIUM CHLORIDE 0.9 % IV SOLN: Freq: Once | INTRAVENOUS | Status: AC

## 2023-04-13 MED ORDER — SODIUM CHLORIDE 0.9 % IV SOLN
10.0000 mg | Freq: Once | INTRAVENOUS | Status: DC
  Filled 2023-04-13: qty 1

## 2023-04-13 MED ORDER — DIPHENHYDRAMINE HCL 50 MG/ML IJ SOLN: 25.0000 mg | Freq: Once | INTRAMUSCULAR | Status: DC

## 2023-04-13 NOTE — Progress Notes (Signed)
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 Held Bleomycine starting Cycle 4. Completed 6 cycles on 02/03/2023.   CURRENT TREATMENT: --Second line therapy with Adcentris plus Nivolumab started on 03/24/2023  Interval History:  Paul [name] is a 32 year old male who is here for continued evaluation and management of classical hogkins lymphoma. He was last seen by Paul [name] on 03/23/2023. He presents today prior to Cycle 2, Day 1.   [name] reports that he experienced significant pain in his feet after his last treatment. He adds that he was very drowsy after he received benadryl with his premedication and had restless leg. He reports his appetite and energy levels are stable. He denies nausea, vomiting or bowel habit changes. He denies easy bruising or signs of active bleeding. He denies fevers, chills, sweats, shortness of breath, chest pain or cough. He has no other complaints.     MEDICAL HISTORY:  Past Medical History:  Diagnosis Date   ADD (attention deficit disorder)    ADHD (attention deficit hyperactivity disorder)    Asthma    excercise induced and pollen   Auditory processing disorder    Complication of anesthesia    mother states pt. is harder to put under anes. and hard to wake up due to fetal alcohol syndrome; can be combative, per mother; states he will do better if mother is in PACU when he is coming out of anes.   Development delay    states mental status of a 32 year old   Exercise-induced asthma    prn inhaler/neb.   Fetal alcohol syndrome    Narcolepsy    Non-restorable tooth 09/2015   teeth   Separation anxiety    Twin birth     SURGICAL  HISTORY: Past Surgical History:  Procedure Laterality Date   IR IMAGING GUIDED PORT INSERTION  08/11/2022   MULTIPLE EXTRACTIONS WITH ALVEOLOPLASTY N/A 09/23/2015   Procedure: MULTIPLE EXTRACTION WITH ALVEOLOPLASTY;  Surgeon: Ocie Doyne, DDS;  Location: [facility];  Service: Oral Surgery;  Laterality: N/A;   PYLOROMYOTOMY     age [age], performed found to not have stenosis on surgical exam   PYLOROMYOTOMY     did not have pyloric stenosis; did surgery on the wrong twin   RADIOLOGY WITH ANESTHESIA N/A 01/16/2020   Procedure: MRI WITH ANESTHESIA  BRAIN WITH AND WITHOUT CONTRAST;  Surgeon: Radiologist, Medication, MD;  Location: [facility] OR;  Service: Radiology;  Laterality: N/A;    SOCIAL HISTORY: Social History   Socioeconomic History   Marital status: Single    Spouse name: Not on file   Number of children: Not on file   Years of education: Not on file   Highest education level: Not on file  Occupational History   Not on file  Tobacco Use   Smoking status: Never   Smokeless tobacco: Never  Vaping Use   Vaping status: Never Used  Substance and Sexual Activity   Alcohol use: No   Drug use: No   Sexual activity: Not on file  Other Topics Concern   Not on file  Social History Narrative   ** Merged History Encounter **       Social Determinants of Health   Financial  Resource Strain: Low Risk  (04/09/2021)   Received from [facility] visits prior to 11/07/2022., [facility] visits prior to 11/07/2022.   Overall Financial Resource Strain (CARDIA)    Difficulty of Paying Living Expenses: Not hard at all  Food Insecurity: Low Risk  (03/17/2023)   [facility] from [facility]   Food vital sign    Within the past 12 months, you worried that your food would run out before you got money to buy more: Never true    Within the past 12 months, the food you bought just didn't last and you didn't have money to get more. : Never true   Transportation Needs: Not on file (03/17/2023)  Physical Activity: Insufficiently Active (04/09/2021)   Received from [facility] visits prior to 11/07/2022., [facility] visits prior to 11/07/2022.   Exercise Vital Sign    Days of Exercise per Week: 5 days    Minutes of Exercise per Session: 10 min  Stress: No Stress Concern Present (04/09/2021)   Received from [facility] visits prior to 11/07/2022., [facility] visits prior to 11/07/2022.   [employer] of Occupational Health - Occupational Stress Questionnaire    Feeling of Stress : Only a little  Social Connections: Socially Isolated (04/09/2021)   Received from [facility] visits prior to 11/07/2022., [facility] visits prior to 11/07/2022.   Social Advertising account executive [NHANES]    Frequency of Communication with Friends and Family: Never    Frequency of Social Gatherings with Friends and Family: Once a week    Attends Religious Services: Never    Database administrator or Organizations: Yes    Attends Engineer, structural: More than 4 times per year    Marital Status: Never married  Intimate Partner Violence: Not At Risk (04/09/2021)   Received from [facility] visits prior to 11/07/2022., [facility] visits prior to 11/07/2022.   Humiliation, Afraid, Rape, and Kick questionnaire    Fear of Current or Ex-Partner: No    Emotionally Abused: No    Physically Abused: No    Sexually Abused: No    FAMILY HISTORY: Family History  Adopted: Yes  Problem Relation Age of Onset   Mental illness Mother    Diabetes Mother    Hypertension Mother    Schizophrenia Mother    Bipolar disorder Mother    Mental retardation Father    Sleep apnea Neg Hx     ALLERGIES:  is allergic to other.  MEDICATIONS:  Current Outpatient Medications  Medication Sig Dispense  Refill   albuterol (PROVENTIL HFA;VENTOLIN HFA) 108 (90 BASE) MCG/ACT inhaler Inhale into the lungs every 6 (six) hours as needed for wheezing or shortness of breath.     albuterol (PROVENTIL) (5 MG/ML) 0.5% nebulizer solution Take 2.5 mg by nebulization every 6 (six) hours as needed for wheezing or shortness of breath.     Cholecalciferol (VITAMIN D3) 250 MCG (10000 UT) capsule Take 10,000 Units by mouth daily.     dexamethasone (DECADRON) 4 MG tablet Take 2 tablets by mouth once a day for 3 days after chemotherapy. Take with food. 30 tablet 1   gabapentin (NEURONTIN) 300 MG capsule Take 1 capsule (300 mg total) by mouth at bedtime. 30 capsule 0   lidocaine-prilocaine (EMLA) cream Apply to affected area once 30  g 3   loratadine (CLARITIN) 10 MG tablet Take 10 mg by mouth daily.     mometasone-formoterol (DULERA) 200-5 MCG/ACT AERO Inhale 2 puffs into the lungs 2 (two) times daily. 8.8 g 0   Multiple Vitamin (MULTIVITAMIN WITH MINERALS) TABS tablet Take 1 tablet by mouth daily.     olopatadine (PATADAY) 0.1 % ophthalmic solution Place 1 drop into both eyes 2 (two) times daily. 5 mL 2   ondansetron (ZOFRAN) 4 MG tablet Take 1 tablet (4 mg total) by mouth every 6 (six) hours as needed for nausea. 20 tablet 0   prochlorperazine (COMPAZINE) 10 MG tablet Take 1 tablet (10 mg total) by mouth every 6 (six) hours as needed for nausea or vomiting. 30 tablet 1   triamcinolone (KENALOG) 0.025 % ointment Apply 1 Application topically 2 (two) times daily. To rash on hands 30 g 0   urea (CARMOL) 10 % cream Apply topically 2 (two) times daily. To hands and feet 453 g 1   No current facility-administered medications for this visit.    REVIEW OF SYSTEMS:    10 Point review of Systems was done is negative except as noted above.   PHYSICAL EXAMINATION  BP 117/82 (BP Location: Right Arm, Patient Position: Sitting)   Pulse 96   Temp 99.3 F (37.4 C) (Temporal)   Resp 17   Wt 250 lb (113.4 kg)   SpO2 98%    BMI 35.87 kg/m  GENERAL:alert, in no acute distress and comfortable SKIN: no acute rashes, no significant lesions EYES: conjunctiva are pink and non-injected, sclera anicteric LUNGS: clear to auscultation b/l with normal respiratory effort HEART: regular rate & rhythm Extremity: no pedal edema PSYCH: alert & oriented x 3 with fluent speech NEURO: no focal motor/sensory deficits   LABORATORY DATA:  I have reviewed the data as listed  .    Latest Ref Rng & Units 04/13/2023   12:40 PM 03/23/2023   12:38 PM 03/01/2023    1:37 PM  CBC  WBC 4.0 - 10.5 K/uL 5.3  5.8  4.8   Hemoglobin 13.0 - 17.0 g/dL 23.7  62.8  31.5   Hematocrit 39.0 - 52.0 % 36.1  35.5  36.2   Platelets 150 - 400 K/uL 224  196  243    ANC 300  .    Latest Ref Rng & Units 04/13/2023   12:40 PM 03/23/2023   12:38 PM 03/01/2023    1:37 PM  CMP  Glucose 70 - 99 mg/dL 96  89  176   BUN 6 - 20 mg/dL 8  14  13    Creatinine 0.61 - 1.24 mg/dL 1.60  7.37  1.06   Sodium 135 - 145 mmol/L 136  139  138   Potassium 3.5 - 5.1 mmol/L 3.6  3.8  3.8   Chloride 98 - 111 mmol/L 103  107  105   CO2 22 - 32 mmol/L 27  27  28    Calcium 8.9 - 10.3 mg/dL 8.7  9.1  9.3   Total Protein 6.5 - 8.1 g/dL 7.1  6.5  6.2   Total Bilirubin 0.3 - 1.2 mg/dL 0.4  0.3  0.2   Alkaline Phos 38 - 126 U/L 108  100  93   AST 15 - 41 U/L 26  22  23    ALT 0 - 44 U/L 44  18  21    . Lab Results  Component Value Date   LDH 171 07/03/2022   RADIOGRAPHIC  STUDIES: I have personally reviewed the radiological images as listed and agreed with the findings in the report. No results found.  Surgical Pathology result from 07/03/2022: FINAL MICROSCOPIC DIAGNOSIS:  A. MEDIASTINAL MASS, ANTERIOR, NEEDLE CORE BIOPSY: -Atypical lymphoid proliferation consistent with classical Hodgkin lymphoma -See comment  COMMENT:  The sections show needle core biopsy fragments displaying a nodular and diffuse lymphoid proliferation associated with dense sclerosis.   The lymphoid process shows predominance of small lymphoid cells admixed to a lesser extent with histiocytes, eosinophils in addition to the large atypical mononuclear and occasionally lobated lymphoid appearing cells with variably prominent nucleoli.  Flow cytometric analysis was performed [facility] [phone]) and shows predominance of CD4-positive T cells. No monoclonal B-cell population identified.  In addition, a battery of immunohistochemical stains was performed including CD30, CD15, mum 1, LCA, CD20, PAX5, CD3, CD5, CD4, CD8, TdT, cytokeratin AE1/AE3, cytokeratin 8/18 and EBV in situ hybridization with appropriate controls.  The large atypical lymphoid appearing cells are positive for CD15, CD30, PAX5, mum 1 and rare cells for CD20.  No significant staining is seen with LCA, EBV, CD3, CD5, CD4, CD8.  Cytokeratin stains highlight a small focus of positivity likely representing native epithelial elements.  No significant TdT positivity is identified.  The small lymphocytes in the background show a mixture of T and B cells with predominance of T cells.  The latter show predominance of CD4 positive cells.  Overall, the features are atypical and most consistent with classical Hodgkin lymphoma which is best subclassified as nodular sclerosis type.   ASSESSMENT & PLAN:  NYREE SMUCK is a l.age   # Stage II bulky Classical Hodgkins lymphoma -Presented as a large mediastinal mass causing some compression of his SVC and left brachiocephalic vein but no complete obstruction or symptoms related to this.No constitutional symptoms. -Started systemic chemotherapy with ABVD on 08/12/2022.  --Mid treatment PET from 10/19/2022 was reviewed and shows decrease in size and FDG avidity of dominant anterior mediastinal mass and additional nodes.  --Held Bleomycin starting Cycle 4. Completed 6 cycles on 02/03/2023.  --Most treatment PET scan from 02/25/2023 showed interval increase in size and metabolic activity  of right anterior mediastinal mass consistent with recurrence.  --Patient was seen at [facility] for transplant consideration and patient's parents declined consideration of AutoHSCT since patient would not have home support to handle this treatment. 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  PLAN: --Due for Cycle 2, Day 1 of Adcentris plus Nivolumab today --Labs from today were reviewed and adequate for treatment. WBC 5.3, Hgb 12.3, Plt 224, creatinine and LFTs normal. --Due to patient having severe neuropathic pain, discussed with Dr. Leonides Schanz who recommends holding Adcentris and proceeding with nivolumab alone.  --Will send gabapentin 300 mg PO nightly and can titrate to BID if dose is tolerated.    FOLLOW-UP: --RTC in 3 weeks with labs and follow up before Cycle 3, Day 1.     All of the patient's questions were answered with apparent satisfaction. The patient knows to call the clinic with any problems, questions or concerns.   I have spent a total of 30 minutes minutes of face-to-face and non-face-to-face time, preparing to see the patient, performing a medically appropriate examination, counseling and educating the patient, ordering medications, documenting clinical information in the electronic health record, independently interpreting results and communicating  results to the patient, and care coordination.   Paul Kaufmann PA-C Dept of Hematology and Oncology [facility] Phone: [phone]\

## 2023-04-13 NOTE — Patient Instructions (Signed)
[facility]  Discharge Instructions: Thank you for choosing [facility] to provide your oncology and hematology care.   If you have a lab appointment with the Cancer Center, please go directly to the Cancer Center and check in at the registration area.   Wear comfortable clothing and clothing appropriate for easy access to any Portacath or PICC line.   We strive to give you quality time with your provider. You may need to reschedule your appointment if you arrive late (15 or more minutes).  Arriving late affects you and other patients whose appointments are after yours.  Also, if you miss three or more appointments without notifying the office, you may be dismissed from the clinic at the provider's discretion.      For prescription refill requests, have your pharmacy contact our office and allow 72 hours for refills to be completed.    Today you received the following chemotherapy and/or immunotherapy agents: (Opdivo) Nivolumab      To help prevent nausea and vomiting after your treatment, we encourage you to take your nausea medication as directed.  BELOW ARE SYMPTOMS THAT SHOULD BE REPORTED IMMEDIATELY: *FEVER GREATER THAN 100.4 F (38 C) OR HIGHER *CHILLS OR SWEATING *NAUSEA AND VOMITING THAT IS NOT CONTROLLED WITH YOUR NAUSEA MEDICATION *UNUSUAL SHORTNESS OF BREATH *UNUSUAL BRUISING OR BLEEDING *URINARY PROBLEMS (pain or burning when urinating, or frequent urination) *BOWEL PROBLEMS (unusual diarrhea, constipation, pain near the anus) TENDERNESS IN MOUTH AND THROAT WITH OR WITHOUT PRESENCE OF ULCERS (sore throat, sores in mouth, or a toothache) UNUSUAL RASH, SWELLING OR PAIN  UNUSUAL VAGINAL DISCHARGE OR ITCHING   Items with * indicate a potential emergency and should be followed up as soon as possible or go to the Emergency Department if any problems should occur.  Please show the CHEMOTHERAPY ALERT CARD or IMMUNOTHERAPY ALERT CARD  at check-in to the Emergency Department and triage nurse.  Should you have questions after your visit or need to cancel or reschedule your appointment, please contact [facility]  Dept: [phone]  and follow the prompts.  Office hours are 8:00 a.m. to 4:30 p.m. Monday - Friday. Please note that voicemails left after 4:00 p.m. may not be returned until the following business day.  We are closed weekends and major holidays. You have access to a nurse at all times for urgent questions. Please call the main number to the clinic Dept: [phone] and follow the prompts.   For any non-urgent questions, you may also contact your provider using MyChart. We now offer e-Visits for anyone 60 and older to request care online for non-urgent symptoms. For details visit [email or web address].   Also download the MyChart app! Go to the app store, search "MyChart", open the app, select [facility], and log in with your MyChart username and password.  Brentuximab Vedotin Injection What is this medication? BRENTUXIMAB VEDOTIN (bren TUX see mab ve DOE tin) treats lymphoma. It works by blocking a protein that causes cancer cells to grow and multiply. This helps to slow or stop the spread of cancer cells. This medicine may be used for other purposes; ask your health care provider or pharmacist if you have questions. COMMON BRAND NAME(S): ADCETRIS What should I tell my care team before I take this medication? They need to know if you have any of these conditions: Diabetes Kidney disease Liver disease Low white blood cell levels Lung disease Tingling of the fingers  or toes or other nerve disorder An unusual or allergic reaction to brentuximab vedotin, other medications, foods, dyes, or preservatives Pregnant or trying to get pregnant Breast-feeding How should I use this medication? This medication is injected into a vein. It is given by your care team in a hospital or  clinic setting. Talk to your care team about the use of this medication in children. While it may be given to children as young as 2 years for selected conditions, precautions do apply. Overdosage: If you think you have taken too much of this medicine contact a poison control center or emergency room at once. NOTE: This medicine is only for you. Do not share this medicine with others. What if I miss a dose? Keep appointments for follow-up doses. It is important not to miss your dose. Call your care team if you are unable to keep an appointment. What may interact with this medication? Do not take this medication with any of the following: Bleomycin This medication may also interact with the following: Ketoconazole Rifampin St. John's Wort This list may not describe all possible interactions. Give your health care provider a list of all the medicines, herbs, non-prescription drugs, or dietary supplements you use. Also tell them if you smoke, drink alcohol, or use illegal drugs. Some items may interact with your medicine. What should I watch for while using this medication? Your condition will be monitored carefully while you are receiving this medication. You may need blood work while taking this medication. This medication may increase your risk of getting an infection. Call your care team for advice if you get a fever, chills, sore throat, or other symptoms of a cold or flu. Do not treat yourself. Try to avoid being around people who are sick. In some patients, this medication may cause a serious brain infection that may cause death. If you have any problems seeing, thinking, speaking, walking, or standing, tell your care team right away. If you cannot reach your care team, urgently seek other source of medical care. This medication may increase blood sugar. The risk may be higher in patients who already have diabetes. Ask your care team what you can do to lower your risk of diabetes while taking  this medication. Talk to your care team if you or your partner may be pregnant. You will need a negative pregnancy test before starting this medication. Contraception is recommended while taking this medication and for 2 months after the last dose. Your care team can help you find the option that works for you. Use a condom during sex and for 4 months after stopping therapy. Tell your care team right away if you think your partner might be pregnant. Do not breast-feed while taking this medication. This medication may cause infertility. Talk to your care team if you are concerned about your fertility. What side effects may I notice from receiving this medication? Side effects that you should report to your care team as soon as possible: Allergic reactions--skin rash, itching, hives, swelling of the face, lips, tongue, or throat Dizziness, loss of balance or coordination, confusion or trouble speaking High blood sugar (hyperglycemia)--increased thirst or amount of urine, unusual weakness or fatigue, blurry vision Infection--fever, chills, cough, sore throat, wounds that don't heal, pain or trouble when passing urine, general feeling of discomfort or being unwell Liver injury--right upper belly pain, loss of appetite, nausea, light-colored stool, dark yellow or brown urine, yellowing skin or eyes, unusual weakness or fatigue Low red blood cell level--unusual  weakness or fatigue, dizziness, headache, trouble breathing Lung injury--shortness of breath or trouble breathing, cough, spitting up blood, chest pain, fever Pain, tingling, or numbness in the hands or feet Pancreatitis--severe stomach pain that spreads to your back or gets worse after eating or when touched, fever, nausea, vomiting Redness, blistering, peeling, or loosening of the skin, including inside the mouth Stomach bleeding--bloody or black, tar-like stools, vomiting blood or brown material that looks like coffee grounds Sudden or severe  stomach pain, bloody diarrhea, fever, nausea, vomiting Tumor lysis syndrome (TLS)--nausea, vomiting, diarrhea, decrease in the amount of urine, dark urine, unusual weakness or fatigue, confusion, muscle pain or cramps, fast or irregular heartbeat, joint pain Unusual bruising or bleeding Side effects that usually do not require medical attention (report to your care team if they continue or are bothersome): Cough Diarrhea Fatigue Joint pain Nausea Stomach pain Vomiting This list may not describe all possible side effects. Call your doctor for medical advice about side effects. You may report side effects to FDA at 1-800-FDA-1088. Where should I keep my medication? This medication is given in a hospital or clinic. It will not be stored at home. NOTE: This sheet is a summary. It may not cover all possible information. If you have questions about this medicine, talk to your doctor, pharmacist, or health care provider.  2024 Elsevier/Gold Standard (2022-02-27 00:00:00)  Nivolumab Injection What is this medication? NIVOLUMAB (nye VOL ue mab) treats some types of cancer. It works by helping your immune system slow or stop the spread of cancer cells. It is a monoclonal antibody. This medicine may be used for other purposes; ask your health care provider or pharmacist if you have questions. COMMON BRAND NAME(S): Opdivo What should I tell my care team before I take this medication? They need to know if you have any of these conditions: Allogeneic stem cell transplant (uses someone else's stem cells) Autoimmune diseases, such as Crohn disease, ulcerative colitis, lupus History of chest radiation Nervous system problems, such as Guillain-Barre syndrome or myasthenia gravis Organ transplant An unusual or allergic reaction to nivolumab, other medications, foods, dyes, or preservatives Pregnant or trying to get pregnant Breast-feeding How should I use this medication? This medication is infused  into a vein. It is given in a hospital or clinic setting. A special MedGuide will be given to you before each treatment. Be sure to read this information carefully each time. Talk to your care team about the use of this medication in children. While it may be prescribed for children as young as 12 years for selected conditions, precautions do apply. Overdosage: If you think you have taken too much of this medicine contact a poison control center or emergency room at once. NOTE: This medicine is only for you. Do not share this medicine with others. What if I miss a dose? Keep appointments for follow-up doses. It is important not to miss your dose. Call your care team if you are unable to keep an appointment. What may interact with this medication? Interactions have not been studied. This list may not describe all possible interactions. Give your health care provider a list of all the medicines, herbs, non-prescription drugs, or dietary supplements you use. Also tell them if you smoke, drink alcohol, or use illegal drugs. Some items may interact with your medicine. What should I watch for while using this medication? Your condition will be monitored carefully while you are receiving this medication. You may need blood work while taking this medication. This  medication may cause serious skin reactions. They can happen weeks to months after starting the medication. Contact your care team right away if you notice fevers or flu-like symptoms with a rash. The rash may be red or purple and then turn into blisters or peeling of the skin. You may also notice a red rash with swelling of the face, lips, or lymph nodes in your neck or under your arms. Tell your care team right away if you have any change in your eyesight. Talk to your care team if you are pregnant or think you might be pregnant. A negative pregnancy test is required before starting this medication. A reliable form of contraception is recommended  while taking this medication and for 5 months after the last dose. Talk to your care team about effective forms of contraception. Do not breast-feed while taking this medication and for 5 months after the last dose. What side effects may I notice from receiving this medication? Side effects that you should report to your care team as soon as possible: Allergic reactions--skin rash, itching, hives, swelling of the face, lips, tongue, or throat Dry cough, shortness of breath or trouble breathing Eye pain, redness, irritation, or discharge with blurry or decreased vision Heart muscle inflammation--unusual weakness or fatigue, shortness of breath, chest pain, fast or irregular heartbeat, dizziness, swelling of the ankles, feet, or hands Hormone gland problems--headache, sensitivity to light, unusual weakness or fatigue, dizziness, fast or irregular heartbeat, increased sensitivity to cold or heat, excessive sweating, constipation, hair loss, increased thirst or amount of urine, tremors or shaking, irritability Infusion reactions--chest pain, shortness of breath or trouble breathing, feeling faint or lightheaded Kidney injury (glomerulonephritis)--decrease in the amount of urine, red or dark brown urine, foamy or bubbly urine, swelling of the ankles, hands, or feet Liver injury--right upper belly pain, loss of appetite, nausea, light-colored stool, dark yellow or brown urine, yellowing skin or eyes, unusual weakness or fatigue Pain, tingling, or numbness in the hands or feet, muscle weakness, change in vision, confusion or trouble speaking, loss of balance or coordination, trouble walking, seizures Rash, fever, and swollen lymph nodes Redness, blistering, peeling, or loosening of the skin, including inside the mouth Sudden or severe stomach pain, bloody diarrhea, fever, nausea, vomiting Side effects that usually do not require medical attention (report these to your care team if they continue or are  bothersome): Bone, joint, or muscle pain Diarrhea Fatigue Loss of appetite Nausea Skin rash This list may not describe all possible side effects. Call your doctor for medical advice about side effects. You may report side effects to FDA at 1-800-FDA-1088. Where should I keep my medication? This medication is given in a hospital or clinic. It will not be stored at home. NOTE: This sheet is a summary. It may not cover all possible information. If you have questions about this medicine, talk to your doctor, pharmacist, or health care provider.  2024 Elsevier/Gold Standard (2021-12-22 00:00:00)

## 2023-04-14 ENCOUNTER — Ambulatory Visit: Payer: Medicare Other

## 2023-04-14 ENCOUNTER — Other Ambulatory Visit: Payer: Medicare Other

## 2023-04-14 MED ORDER — GABAPENTIN 300 MG PO CAPS
300.0000 mg | ORAL_CAPSULE | Freq: Every day | ORAL | 0 refills | Status: DC
Start: 2023-04-14 — End: 2023-05-04

## 2023-04-15 ENCOUNTER — Encounter: Payer: Self-pay | Admitting: Hematology

## 2023-04-17 ENCOUNTER — Encounter: Payer: Self-pay | Admitting: Hematology

## 2023-04-20 ENCOUNTER — Other Ambulatory Visit: Payer: Self-pay

## 2023-04-27 ENCOUNTER — Encounter: Payer: Self-pay | Admitting: Hematology

## 2023-05-03 MED FILL — Dexamethasone Sodium Phosphate Inj 100 MG/10ML: INTRAMUSCULAR | Qty: 1 | Status: AC

## 2023-05-04 ENCOUNTER — Inpatient Hospital Stay (HOSPITAL_BASED_OUTPATIENT_CLINIC_OR_DEPARTMENT_OTHER): Payer: Medicare HMO | Admitting: Hematology

## 2023-05-04 ENCOUNTER — Inpatient Hospital Stay: Payer: Medicare HMO

## 2023-05-04 ENCOUNTER — Encounter: Payer: Self-pay | Admitting: Hematology

## 2023-05-04 ENCOUNTER — Other Ambulatory Visit: Payer: Self-pay

## 2023-05-04 VITALS — BP 112/72 | HR 82 | Resp 20

## 2023-05-04 VITALS — BP 117/81 | HR 75 | Temp 97.3°F | Resp 20 | Wt 251.2 lb

## 2023-05-04 DIAGNOSIS — Z5112 Encounter for antineoplastic immunotherapy: Secondary | ICD-10-CM | POA: Diagnosis not present

## 2023-05-04 DIAGNOSIS — C8192 Hodgkin lymphoma, unspecified, intrathoracic lymph nodes: Secondary | ICD-10-CM

## 2023-05-04 DIAGNOSIS — Z5111 Encounter for antineoplastic chemotherapy: Secondary | ICD-10-CM

## 2023-05-04 DIAGNOSIS — Z95828 Presence of other vascular implants and grafts: Secondary | ICD-10-CM

## 2023-05-04 DIAGNOSIS — C8102 Nodular lymphocyte predominant Hodgkin lymphoma, intrathoracic lymph nodes: Secondary | ICD-10-CM

## 2023-05-04 LAB — CMP (CANCER CENTER ONLY)
ALT: 24 U/L (ref 0–44)
AST: 26 U/L (ref 15–41)
Albumin: 4.1 g/dL (ref 3.5–5.0)
Alkaline Phosphatase: 99 U/L (ref 38–126)
Anion gap: 3 — ABNORMAL LOW (ref 5–15)
BUN: 12 mg/dL (ref 6–20)
CO2: 30 mmol/L (ref 22–32)
Calcium: 9.2 mg/dL (ref 8.9–10.3)
Chloride: 103 mmol/L (ref 98–111)
Creatinine: 0.84 mg/dL (ref 0.61–1.24)
GFR, Estimated: >60 mL/min (ref >60)
Glucose, Bld: 102 mg/dL — ABNORMAL HIGH (ref 70–99)
Potassium: 3.8 mmol/L (ref 3.5–5.1)
Sodium: 136 mmol/L (ref 135–145)
Total Bilirubin: 0.3 mg/dL (ref 0.3–1.2)
Total Protein: 7.1 g/dL (ref 6.5–8.1)

## 2023-05-04 LAB — CBC WITH DIFFERENTIAL (CANCER CENTER ONLY)
Abs Immature Granulocytes: 0.01 10*3/uL (ref 0.00–0.07)
Basophils Absolute: 0 10*3/uL (ref 0.0–0.1)
Basophils Relative: 1 %
Eosinophils Absolute: 0.3 10*3/uL (ref 0.0–0.5)
Eosinophils Relative: 6 %
HCT: 38.4 % — ABNORMAL LOW (ref 39.0–52.0)
Hemoglobin: 13.1 g/dL (ref 13.0–17.0)
Immature Granulocytes: 0 %
Lymphocytes Relative: 37 %
Lymphs Abs: 1.7 10*3/uL (ref 0.7–4.0)
MCH: 29.2 pg (ref 26.0–34.0)
MCHC: 34.1 g/dL (ref 30.0–36.0)
MCV: 85.7 fL (ref 80.0–100.0)
Monocytes Absolute: 0.4 10*3/uL (ref 0.1–1.0)
Monocytes Relative: 9 %
Neutro Abs: 2.2 10*3/uL (ref 1.7–7.7)
Neutrophils Relative %: 47 %
Platelet Count: 240 10*3/uL (ref 150–400)
RBC: 4.48 MIL/uL (ref 4.22–5.81)
RDW: 13.9 % (ref 11.5–15.5)
WBC Count: 4.6 10*3/uL (ref 4.0–10.5)
nRBC: 0 % (ref 0.0–0.2)

## 2023-05-04 MED ORDER — SODIUM CHLORIDE 0.9% FLUSH
10.0000 mL | INTRAVENOUS | Status: DC | PRN
  Administered 2023-05-04: 10 mL

## 2023-05-04 MED ORDER — SODIUM CHLORIDE 0.9% FLUSH
10.0000 mL | Freq: Once | INTRAVENOUS | Status: AC
  Administered 2023-05-04: 10 mL

## 2023-05-04 MED ORDER — SODIUM CHLORIDE 0.9 % IV SOLN
480.0000 mg | Freq: Once | INTRAVENOUS | Status: AC
  Administered 2023-05-04: 480 mg via INTRAVENOUS
  Filled 2023-05-04: qty 48

## 2023-05-04 MED ORDER — ACETAMINOPHEN 325 MG PO TABS
650.0000 mg | ORAL_TABLET | Freq: Once | ORAL | Status: AC
  Administered 2023-05-04: 650 mg via ORAL
  Filled 2023-05-04: qty 2

## 2023-05-04 MED ORDER — SODIUM CHLORIDE 0.9 % IV SOLN: Freq: Once | INTRAVENOUS | Status: AC

## 2023-05-04 MED ORDER — CETIRIZINE HCL 10 MG PO TABS
10.0000 mg | ORAL_TABLET | Freq: Once | ORAL | Status: AC
  Administered 2023-05-04: 10 mg via ORAL
  Filled 2023-05-04: qty 1

## 2023-05-04 MED ORDER — DULOXETINE HCL 20 MG PO CPEP: 20.0000 mg | ORAL_CAPSULE | Freq: Every day | ORAL | 1 refills | Status: DC

## 2023-05-04 MED ORDER — HEPARIN SOD (PORK) LOCK FLUSH 100 UNIT/ML IV SOLN
500.0000 [IU] | Freq: Once | INTRAVENOUS | Status: AC | PRN
  Administered 2023-05-04: 500 [IU]

## 2023-05-04 NOTE — Patient Instructions (Signed)
[facility]  Discharge Instructions: Thank you for choosing [facility] to provide your oncology and hematology care.   If you have a lab appointment with the Cancer Center, please go directly to the Cancer Center and check in at the registration area.   Wear comfortable clothing and clothing appropriate for easy access to any Portacath or PICC line.   We strive to give you quality time with your provider. You may need to reschedule your appointment if you arrive late (15 or more minutes).  Arriving late affects you and other patients whose appointments are after yours.  Also, if you miss three or more appointments without notifying the office, you may be dismissed from the clinic at the provider's discretion.      For prescription refill requests, have your pharmacy contact our office and allow 72 hours for refills to be completed.    Today you received the following chemotherapy and/or immunotherapy agent: Nivolumab (Opdivo)   To help prevent nausea and vomiting after your treatment, we encourage you to take your nausea medication as directed.  BELOW ARE SYMPTOMS THAT SHOULD BE REPORTED IMMEDIATELY: *FEVER GREATER THAN 100.4 F (38 C) OR HIGHER *CHILLS OR SWEATING *NAUSEA AND VOMITING THAT IS NOT CONTROLLED WITH YOUR NAUSEA MEDICATION *UNUSUAL SHORTNESS OF BREATH *UNUSUAL BRUISING OR BLEEDING *URINARY PROBLEMS (pain or burning when urinating, or frequent urination) *BOWEL PROBLEMS (unusual diarrhea, constipation, pain near the anus) TENDERNESS IN MOUTH AND THROAT WITH OR WITHOUT PRESENCE OF ULCERS (sore throat, sores in mouth, or a toothache) UNUSUAL RASH, SWELLING OR PAIN  UNUSUAL VAGINAL DISCHARGE OR ITCHING   Items with * indicate a potential emergency and should be followed up as soon as possible or go to the Emergency Department if any problems should occur.  Please show the CHEMOTHERAPY ALERT CARD or IMMUNOTHERAPY ALERT CARD at  check-in to the Emergency Department and triage nurse.  Should you have questions after your visit or need to cancel or reschedule your appointment, please contact [facility]  Dept: [phone]  and follow the prompts.  Office hours are 8:00 a.m. to 4:30 p.m. Monday - Friday. Please note that voicemails left after 4:00 p.m. may not be returned until the following business day.  We are closed weekends and major holidays. You have access to a nurse at all times for urgent questions. Please call the main number to the clinic Dept: [phone] and follow the prompts.   For any non-urgent questions, you may also contact your provider using MyChart. We now offer e-Visits for anyone 18 and older to request care online for non-urgent symptoms. For details visit [email or web address].   Also download the MyChart app! Go to the app store, search "MyChart", open the app, select [facility], and log in with your MyChart username and password.  Nivolumab Injection What is this medication? NIVOLUMAB (nye VOL ue mab) treats some types of cancer. It works by helping your immune system slow or stop the spread of cancer cells. It is a monoclonal antibody. This medicine may be used for other purposes; ask your health care provider or pharmacist if you have questions. COMMON BRAND NAME(S): Opdivo What should I tell my care team before I take this medication? They need to know if you have any of these conditions: Allogeneic stem cell transplant (uses someone else's stem cells) Autoimmune diseases, such as Crohn disease, ulcerative colitis, lupus History of chest radiation Nervous system problems, such   as Guillain-Barre syndrome or myasthenia gravis Organ transplant An unusual or allergic reaction to nivolumab, other medications, foods, dyes, or preservatives Pregnant or trying to get pregnant Breast-feeding How should I use this medication? This medication is infused  into a vein. It is given in a hospital or clinic setting. A special MedGuide will be given to you before each treatment. Be sure to read this information carefully each time. Talk to your care team about the use of this medication in children. While it may be prescribed for children as young as 12 years for selected conditions, precautions do apply. Overdosage: If you think you have taken too much of this medicine contact a poison control center or emergency room at once. NOTE: This medicine is only for you. Do not share this medicine with others. What if I miss a dose? Keep appointments for follow-up doses. It is important not to miss your dose. Call your care team if you are unable to keep an appointment. What may interact with this medication? Interactions have not been studied. This list may not describe all possible interactions. Give your health care provider a list of all the medicines, herbs, non-prescription drugs, or dietary supplements you use. Also tell them if you smoke, drink alcohol, or use illegal drugs. Some items may interact with your medicine. What should I watch for while using this medication? Your condition will be monitored carefully while you are receiving this medication. You may need blood work while taking this medication. This medication may cause serious skin reactions. They can happen weeks to months after starting the medication. Contact your care team right away if you notice fevers or flu-like symptoms with a rash. The rash may be red or purple and then turn into blisters or peeling of the skin. You may also notice a red rash with swelling of the face, lips, or lymph nodes in your neck or under your arms. Tell your care team right away if you have any change in your eyesight. Talk to your care team if you are pregnant or think you might be pregnant. A negative pregnancy test is required before starting this medication. A reliable form of contraception is recommended  while taking this medication and for 5 months after the last dose. Talk to your care team about effective forms of contraception. Do not breast-feed while taking this medication and for 5 months after the last dose. What side effects may I notice from receiving this medication? Side effects that you should report to your care team as soon as possible: Allergic reactions--skin rash, itching, hives, swelling of the face, lips, tongue, or throat Dry cough, shortness of breath or trouble breathing Eye pain, redness, irritation, or discharge with blurry or decreased vision Heart muscle inflammation--unusual weakness or fatigue, shortness of breath, chest pain, fast or irregular heartbeat, dizziness, swelling of the ankles, feet, or hands Hormone gland problems--headache, sensitivity to light, unusual weakness or fatigue, dizziness, fast or irregular heartbeat, increased sensitivity to cold or heat, excessive sweating, constipation, hair loss, increased thirst or amount of urine, tremors or shaking, irritability Infusion reactions--chest pain, shortness of breath or trouble breathing, feeling faint or lightheaded Kidney injury (glomerulonephritis)--decrease in the amount of urine, red or dark brown urine, foamy or bubbly urine, swelling of the ankles, hands, or feet Liver injury--right upper belly pain, loss of appetite, nausea, light-colored stool, dark yellow or brown urine, yellowing skin or eyes, unusual weakness or fatigue Pain, tingling, or numbness in the hands or feet, muscle weakness,   change in vision, confusion or trouble speaking, loss of balance or coordination, trouble walking, seizures Rash, fever, and swollen lymph nodes Redness, blistering, peeling, or loosening of the skin, including inside the mouth Sudden or severe stomach pain, bloody diarrhea, fever, nausea, vomiting Side effects that usually do not require medical attention (report these to your care team if they continue or are  bothersome): Bone, joint, or muscle pain Diarrhea Fatigue Loss of appetite Nausea Skin rash This list may not describe all possible side effects. Call your doctor for medical advice about side effects. You may report side effects to FDA at 1-800-FDA-1088. Where should I keep my medication? This medication is given in a hospital or clinic. It will not be stored at home. NOTE: This sheet is a summary. It may not cover all possible information. If you have questions about this medicine, talk to your doctor, pharmacist, or health care provider.  2024 Elsevier/Gold Standard (2021-12-22 00:00:00)   

## 2023-05-04 NOTE — Progress Notes (Signed)
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 male who has been referred to [place] by .Lazoff, Shawn P, DO and Dr. Clydell Hakim MD for evaluation and management of newly diagnosed mediastinal mass concerning for likely lymphoma.  Patient has a history of fetal alcohol syndrome with developmental delay, narcolepsy, ADD, exercise-induced asthma and lives with his adoptive parents. He recently presented to the hospital on 07/01/2022 with 2-week history of worsening shortness of breath cough and wheezing.  He received his asthma treatment but was still noted to have significant cough.  In the emergency room he had a chest x-ray which showed an mediastinal mass. Subsequent CT chest with contrast on 07/01/2022 showed extensive mediastinal and hilar lymphadenopathy with conglomerate soft tissue mass/adenopathy within the anterior mediastinum measuring 11.2 x 7.9 x 13 cm. Compression of the left brachiocephalic and SVC within the mid though these vascular structures remain patent.  Patient did not have any clinical signs or symptoms of SVC compression syndrome or left upper extremity swelling.  Patient subsequently had a CT of the abdomen and pelvis which showed no acute intra-abdominal or intrapelvic abnormalities. He underwent a CT-guided core needle biopsy of the mediastinal mass by interventional radiology.  The official pathology results from his biopsy are not currently available at the time of this clinic visit. I did call and talk to the pathologist Dr. [place] Callas and she notes that this either looks like a lymphoma or thymoma and she is running additional tests to make a final  diagnosis.  Patient's father accompanied him for this visit and his mother who helps make most of the decisions in tandem with his father was present on the phone.  They do not note any unexplained fevers chills night sweats or significant weight loss.  His breathing has been relatively stable since hospital discharge but he is still having some cough.  I confirmed adequacy of tissue sampling with the pathologist and the patient was then started on prednisone to try to help his cough by drinking his mediastinal tumor some and reducing bronchial inflammation.  Patient is unable to provide much information or review of systems on account of his developmental delay issues.  Interval History:   Paul [name] is a wonderful 32 y.o. male who is here for continued evaluation and management of classical hogkins lymphoma. He is here to receive cycle 3 day 1 of his treatment, only Nivolumab not ADCETRIS.  Patient was last seen by PA Thayil on 04/15/2023 and he complained of bilateral leg/feet pain after cycle 1 of his treatment, increased drowsiness, and restless leg.  Patient is accompanied by his father during this visit. He notes he has been doing well overall since our last visit. He complains of bilateral leg neuropathy, which has slightly improved. He has not been taking Gabapentin since it was making him sleep more.  He denies any recent infection issues, new lumps/bumps, fever, chills, night sweats, unexpected weight loss, abdominal pain, chest pain, back pain, shortness of breath, or leg swelling. Patient complains of bilateral feet pain during this visit and rates the pain 4/10. He describes the pain as a burning pain. He notes that the pain does not affect his walking.   Patient's father  notes that the patient has been sleeping more frequently and randomly throughout the day. Patient went to his PCP yesterday and has been referred to a Neurologist.   He does have a history of sleep apnea and  has a cpap machine at home. Patient notes he regularly uses his cpap machine. Patient uses his Cpap machine whenever he takes nap as well.   MEDICAL HISTORY:  Past Medical History:  Diagnosis Date   ADD (attention deficit disorder)    ADHD (attention deficit hyperactivity disorder)    Asthma    excercise induced and pollen   Auditory processing disorder    Complication of anesthesia    mother states pt. is harder to put under anes. and hard to wake up due to fetal alcohol syndrome; can be combative, per mother; states he will do better if mother is in PACU when he is coming out of anes.   Development delay    states mental status of a 32 year old   Exercise-induced asthma    prn inhaler/neb.   Fetal alcohol syndrome    Narcolepsy    Non-restorable tooth 09/2015   teeth   Separation anxiety    Twin birth     SURGICAL HISTORY: Past Surgical History:  Procedure Laterality Date   IR IMAGING GUIDED PORT INSERTION  08/11/2022   MULTIPLE EXTRACTIONS WITH ALVEOLOPLASTY N/A 09/23/2015   Procedure: MULTIPLE EXTRACTION WITH ALVEOLOPLASTY;  Surgeon: Ocie Doyne, DDS;  Location: [facility];  Service: Oral Surgery;  Laterality: N/A;   PYLOROMYOTOMY     age [age], performed found to not have stenosis on surgical exam   PYLOROMYOTOMY     did not have pyloric stenosis; did surgery on the wrong twin   RADIOLOGY WITH ANESTHESIA N/A 01/16/2020   Procedure: MRI WITH ANESTHESIA  BRAIN WITH AND WITHOUT CONTRAST;  Surgeon: Radiologist, Medication, MD;  Location: [facility] OR;  Service: Radiology;  Laterality: N/A;    SOCIAL HISTORY: Social History   Socioeconomic History   Marital status: Single    Spouse name: Not on file   Number of children: Not on file   Years of education: Not on file   Highest education level: Not on file  Occupational History   Not on file  Tobacco Use   Smoking status: Never   Smokeless tobacco: Never  Vaping Use   Vaping status: Never Used  Substance and  Sexual Activity   Alcohol use: No   Drug use: No   Sexual activity: Not on file  Other Topics Concern   Not on file  Social History Narrative   ** Merged History Encounter **       Social Determinants of Health   Financial Resource Strain: Low Risk  (04/09/2021)   Received from [facility] visits prior to 11/07/2022., [name] visits prior to 11/07/2022.   Overall Financial Resource Strain (CARDIA)    Difficulty of Paying Living Expenses: Not hard at all  Food Insecurity: Low Risk  (05/02/2023)   [facility]   Food vital sign    Within the past 12 months, you worried that your food would run out before you got money to buy more: Never true    Within the past 12 months, the food you bought just didn't last and you didn't have money to get more. : Never true  Transportation Needs: No Transportation Needs (05/02/2023)   Received from [employer]    In the  past 12 months, has lack of reliable transportation kept you from medical appointments, meetings, work or from getting things needed for daily living? : No  Physical Activity: Insufficiently Active (04/09/2021)   Received from [facility] visits prior to 11/07/2022., [facility] visits prior to 11/07/2022.   Exercise Vital Sign    Days of Exercise per Week: 5 days    Minutes of Exercise per Session: 10 min  Stress: No Stress Concern Present (04/09/2021)   Received from [facility] visits prior to 11/07/2022., [facility] visits prior to 11/07/2022.   [employer] of Occupational Health - Occupational Stress Questionnaire    Feeling of Stress : Only a little  Social Connections: Socially Isolated (04/09/2021)   Received from [facility] visits prior to 11/07/2022., [facility] visits prior to 11/07/2022.   Social Advertising account executive  [NHANES]    Frequency of Communication with Friends and Family: Never    Frequency of Social Gatherings with Friends and Family: Once a week    Attends Religious Services: Never    Database administrator or Organizations: Yes    Attends Engineer, structural: More than 4 times per year    Marital Status: Never married  Intimate Partner Violence: Not At Risk (04/09/2021)   [facility] prior to 11/07/2022., [facility] visits prior to 11/07/2022.   Humiliation, Afraid, Rape, and Kick questionnaire    Fear of Current or Ex-Partner: No    Emotionally Abused: No    Physically Abused: No    Sexually Abused: No    FAMILY HISTORY: Family History  Adopted: Yes  Problem Relation Age of Onset   Mental illness Mother    Diabetes Mother    Hypertension Mother    Schizophrenia Mother    Bipolar disorder Mother    Mental retardation Father    Sleep apnea Neg Hx     ALLERGIES:  is allergic to other.  MEDICATIONS:  Current Outpatient Medications  Medication Sig Dispense Refill   albuterol (PROVENTIL HFA;VENTOLIN HFA) 108 (90 BASE) MCG/ACT inhaler Inhale into the lungs every 6 (six) hours as needed for wheezing or shortness of breath.     albuterol (PROVENTIL) (5 MG/ML) 0.5% nebulizer solution Take 2.5 mg by nebulization every 6 (six) hours as needed for wheezing or shortness of breath.     Cholecalciferol (VITAMIN D3) 250 MCG (10000 UT) capsule Take 10,000 Units by mouth daily.     dexamethasone (DECADRON) 4 MG tablet Take 2 tablets by mouth once a day for 3 days after chemotherapy. Take with food. 30 tablet 1   gabapentin (NEURONTIN) 300 MG capsule Take 1 capsule (300 mg total) by mouth at bedtime. 30 capsule 0   lidocaine-prilocaine (EMLA) cream Apply to affected area once 30 g 3   loratadine (CLARITIN) 10 MG tablet Take 10 mg by mouth daily.     mometasone-formoterol (DULERA) 200-5 MCG/ACT AERO Inhale 2 puffs into the lungs  2 (two) times daily. 8.8 g 0   Multiple Vitamin (MULTIVITAMIN WITH MINERALS) TABS tablet Take 1 tablet by mouth daily.     olopatadine (PATADAY) 0.1 % ophthalmic solution Place 1 drop into both eyes 2 (two) times daily. 5 mL 2   ondansetron (ZOFRAN) 4 MG tablet Take 1 tablet (4 mg total) by mouth every 6 (six) hours as needed for nausea.  20 tablet 0   prochlorperazine (COMPAZINE) 10 MG tablet Take 1 tablet (10 mg total) by mouth every 6 (six) hours as needed for nausea or vomiting. 30 tablet 1   triamcinolone (KENALOG) 0.025 % ointment Apply 1 Application topically 2 (two) times daily. To rash on hands 30 g 0   urea (CARMOL) 10 % cream Apply topically 2 (two) times daily. To hands and feet 453 g 1   No current facility-administered medications for this visit.    REVIEW OF SYSTEMS:    10 Point review of Systems was done is negative except as noted above.   PHYSICAL EXAMINATION  BP 117/81   Pulse 75   Temp (!) 97.3 F (36.3 C)   Resp 20   Wt 251 lb 3.2 oz (113.9 kg)   SpO2 98%   BMI 36.04 kg/m  GENERAL:alert, in no acute distress and comfortable SKIN: no acute rashes, no significant lesions EYES: conjunctiva are pink and non-injected, sclera anicteric OROPHARYNX: MMM, no exudates, no oropharyngeal erythema or ulceration NECK: supple, no JVD LYMPH:  no palpable lymphadenopathy in the cervical, axillary or inguinal regions LUNGS: clear to auscultation b/l with normal respiratory effort HEART: regular rate & rhythm ABDOMEN:  normoactive bowel sounds , non tender, not distended. No splenomegaly. Extremity: no pedal edema PSYCH: alert & oriented x 3 with fluent speech NEURO: no focal motor/sensory deficits   LABORATORY DATA:  I have reviewed the data as listed  .    Latest Ref Rng & Units 05/04/2023   12:38 PM 04/13/2023   12:40 PM 03/23/2023   12:38 PM  CBC  WBC 4.0 - 10.5 K/uL 4.6  5.3  5.8   Hemoglobin 13.0 - 17.0 g/dL 44.0  34.7  42.5   Hematocrit 39.0 - 52.0 % 38.4  36.1   35.5   Platelets 150 - 400 K/uL 240  224  196    ANC 300  .    Latest Ref Rng & Units 05/04/2023   12:38 PM 04/13/2023   12:40 PM 03/23/2023   12:38 PM  CMP  Glucose 70 - 99 mg/dL 956  96  89   BUN 6 - 20 mg/dL 12  8  14    Creatinine 0.61 - 1.24 mg/dL 3.87  5.64  3.32   Sodium 135 - 145 mmol/L 136  136  139   Potassium 3.5 - 5.1 mmol/L 3.8  3.6  3.8   Chloride 98 - 111 mmol/L 103  103  107   CO2 22 - 32 mmol/L 30  27  27    Calcium 8.9 - 10.3 mg/dL 9.2  8.7  9.1   Total Protein 6.5 - 8.1 g/dL 7.1  7.1  6.5   Total Bilirubin 0.3 - 1.2 mg/dL 0.3  0.4  0.3   Alkaline Phos 38 - 126 U/L 99  108  100   AST 15 - 41 U/L 26  26  22    ALT 0 - 44 U/L 24  44  18    . Lab Results  Component Value Date   LDH 171 07/03/2022   RADIOGRAPHIC STUDIES: I have personally reviewed the radiological images as listed and agreed with the findings in the report. No results found.  Surgical Pathology result from 07/03/2022: FINAL MICROSCOPIC DIAGNOSIS:  A. MEDIASTINAL MASS, ANTERIOR, NEEDLE CORE BIOPSY: -Atypical lymphoid proliferation consistent with classical Hodgkin lymphoma -See comment  COMMENT:  The sections show needle core biopsy fragments displaying a nodular and diffuse lymphoid proliferation associated with dense  sclerosis.  The lymphoid process shows predominance of small lymphoid cells admixed to a lesser extent with histiocytes, eosinophils in addition to the large atypical mononuclear and occasionally lobated lymphoid appearing cells with variably prominent nucleoli.  Flow cytometric analysis was performed [facility] [phone]) and shows predominance of CD4-positive T cells. No monoclonal B-cell population identified.  In addition, a battery of immunohistochemical stains was performed including CD30, CD15, mum 1, LCA, CD20, PAX5, CD3, CD5, CD4, CD8, TdT, cytokeratin AE1/AE3, cytokeratin 8/18 and EBV in situ hybridization with appropriate controls.  The large atypical lymphoid appearing  cells are positive for CD15, CD30, PAX5, mum 1 and rare cells for CD20.  No significant staining is seen with LCA, EBV, CD3, CD5, CD4, CD8.  Cytokeratin stains highlight a small focus of positivity likely representing native epithelial elements.  No significant TdT positivity is identified.  The small lymphocytes in the background show a mixture of T and B cells with predominance of T cells.  The latter show predominance of CD4 positive cells.  Overall, the features are atypical and most consistent with classical Hodgkin lymphoma which is best subclassified as nodular sclerosis type.   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 The mass was causing some compression of his SVC and left brachiocephalic vein but no complete obstruction or symptoms related to this. No constitutional symptoms.  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  PLAN: -Discussed lab results from today, 05/04/2023, with the patient. CBC and CMP are stable.  --Patient will discontinue Gabapentin due to toxicities and change it to Cymbalta.  -Discussed with the patient to Take Cymbalta with food because it might cause nausea.  -Patient only received Nivolumab treatment without ADCETRIS. He tolerated cycle 2 well without any new or severe toxicities.  -Patient can continue cycle 3 day 1 of his treatment, only Nivolumab and not ADCETRIS, during this visit.  -We will change patient's treatment plan to only Nivolumab treatment for 4 weeks. Oral Zyrtec instead of Benadryl and no steroids.  -Continue B-complex supplement.   FOLLOW-UP: -Switching the patient to only nivolumab every 4 weeks with port flush labs and MD  visit.  Please schedule next 2 cycles of treatment.  The total time spent in the appointment was 30 minutes* .  All of the patient's questions were answered with apparent satisfaction. The patient knows to call the clinic with any problems, questions or concerns.   Wyvonnia Lora MD MS AAHIVMS [facility] Hematology/Oncology Physician [facility]  .*Total Encounter Time as defined by the Centers for Medicare and Medicaid Services includes, in addition to the face-to-face time of a patient visit (documented in the note above) non-face-to-face time: obtaining and reviewing outside history, ordering and reviewing medications, tests or procedures, care coordination (communications with other health care professionals or caregivers) and documentation in the medical record.   I,Param Shah,acting as a Neurosurgeon for Wyvonnia Lora, MD.,have documented all relevant documentation on the behalf of Wyvonnia Lora, MD,as directed by  Wyvonnia Lora, MD while in the presence of Wyvonnia Lora, MD.   ..I have reviewed the above documentation for accuracy and completeness, and I agree with the above. Johney Maine MD

## 2023-05-06 ENCOUNTER — Encounter: Payer: Self-pay | Admitting: Hematology

## 2023-05-07 ENCOUNTER — Other Ambulatory Visit: Payer: Self-pay

## 2023-05-08 ENCOUNTER — Other Ambulatory Visit: Payer: Self-pay

## 2023-05-10 ENCOUNTER — Encounter: Payer: Self-pay | Admitting: Hematology

## 2023-05-12 ENCOUNTER — Inpatient Hospital Stay (HOSPITAL_COMMUNITY)
Admission: EM | Admit: 2023-05-12 | Discharge: 2023-05-16 | DRG: 871 | Disposition: A | Discharge status: Home / Self Care | Payer: Medicare HMO | Attending: Internal Medicine | Admitting: Internal Medicine

## 2023-05-12 ENCOUNTER — Encounter: Payer: Self-pay | Admitting: Hematology

## 2023-05-12 ENCOUNTER — Other Ambulatory Visit: Payer: Self-pay

## 2023-05-12 ENCOUNTER — Encounter (HOSPITAL_COMMUNITY): Payer: Self-pay

## 2023-05-12 ENCOUNTER — Emergency Department (HOSPITAL_COMMUNITY): Payer: Medicare HMO

## 2023-05-12 DIAGNOSIS — Z810 Family history of intellectual disabilities: Secondary | ICD-10-CM | POA: Diagnosis not present

## 2023-05-12 DIAGNOSIS — Z889 Allergy status to unspecified drugs, medicaments and biological substances status: Secondary | ICD-10-CM

## 2023-05-12 DIAGNOSIS — E669 Obesity, unspecified: Secondary | ICD-10-CM | POA: Diagnosis present

## 2023-05-12 DIAGNOSIS — Z8249 Family history of ischemic heart disease and other diseases of the circulatory system: Secondary | ICD-10-CM

## 2023-05-12 DIAGNOSIS — C8198 Hodgkin lymphoma, unspecified, lymph nodes of multiple sites: Secondary | ICD-10-CM | POA: Diagnosis present

## 2023-05-12 DIAGNOSIS — J189 Pneumonia, unspecified organism: Secondary | ICD-10-CM | POA: Diagnosis present

## 2023-05-12 DIAGNOSIS — G47419 Narcolepsy without cataplexy: Secondary | ICD-10-CM | POA: Diagnosis present

## 2023-05-12 DIAGNOSIS — A419 Sepsis, unspecified organism: Secondary | ICD-10-CM | POA: Diagnosis present

## 2023-05-12 DIAGNOSIS — R0902 Hypoxemia: Secondary | ICD-10-CM | POA: Diagnosis present

## 2023-05-12 DIAGNOSIS — Q860 Fetal alcohol syndrome (dysmorphic): Secondary | ICD-10-CM | POA: Diagnosis not present

## 2023-05-12 DIAGNOSIS — Z1152 Encounter for screening for COVID-19: Secondary | ICD-10-CM | POA: Diagnosis not present

## 2023-05-12 DIAGNOSIS — Z79620 Long term (current) use of immunosuppressive biologic: Secondary | ICD-10-CM

## 2023-05-12 DIAGNOSIS — G4730 Sleep apnea, unspecified: Secondary | ICD-10-CM | POA: Diagnosis present

## 2023-05-12 DIAGNOSIS — F32A Depression, unspecified: Secondary | ICD-10-CM | POA: Diagnosis present

## 2023-05-12 DIAGNOSIS — Z818 Family history of other mental and behavioral disorders: Secondary | ICD-10-CM

## 2023-05-12 DIAGNOSIS — Z6834 Body mass index (BMI) 34.0-34.9, adult: Secondary | ICD-10-CM | POA: Diagnosis not present

## 2023-05-12 DIAGNOSIS — C8190 Hodgkin lymphoma, unspecified, unspecified site: Secondary | ICD-10-CM | POA: Diagnosis present

## 2023-05-12 DIAGNOSIS — Z833 Family history of diabetes mellitus: Secondary | ICD-10-CM | POA: Diagnosis not present

## 2023-05-12 DIAGNOSIS — F909 Attention-deficit hyperactivity disorder, unspecified type: Secondary | ICD-10-CM | POA: Diagnosis present

## 2023-05-12 DIAGNOSIS — Z79899 Other long term (current) drug therapy: Secondary | ICD-10-CM | POA: Diagnosis not present

## 2023-05-12 DIAGNOSIS — F419 Anxiety disorder, unspecified: Secondary | ICD-10-CM | POA: Diagnosis present

## 2023-05-12 DIAGNOSIS — E876 Hypokalemia: Secondary | ICD-10-CM | POA: Diagnosis present

## 2023-05-12 DIAGNOSIS — J45901 Unspecified asthma with (acute) exacerbation: Secondary | ICD-10-CM | POA: Diagnosis present

## 2023-05-12 DIAGNOSIS — R6520 Severe sepsis without septic shock: Secondary | ICD-10-CM

## 2023-05-12 LAB — CBC WITH DIFFERENTIAL/PLATELET
Abs Immature Granulocytes: 0.01 10*3/uL (ref 0.00–0.07)
Basophils Absolute: 0 10*3/uL (ref 0.0–0.1)
Basophils Relative: 0 %
Eosinophils Absolute: 0 10*3/uL (ref 0.0–0.5)
Eosinophils Relative: 1 %
HCT: 37.4 % — ABNORMAL LOW (ref 39.0–52.0)
Hemoglobin: 12.4 g/dL — ABNORMAL LOW (ref 13.0–17.0)
Immature Granulocytes: 0 %
Lymphocytes Relative: 21 %
Lymphs Abs: 0.8 10*3/uL (ref 0.7–4.0)
MCH: 28.8 pg (ref 26.0–34.0)
MCHC: 33.2 g/dL (ref 30.0–36.0)
MCV: 86.8 fL (ref 80.0–100.0)
Monocytes Absolute: 0.4 10*3/uL (ref 0.1–1.0)
Monocytes Relative: 10 %
Neutro Abs: 2.6 10*3/uL (ref 1.7–7.7)
Neutrophils Relative %: 68 %
Platelets: 156 10*3/uL (ref 150–400)
RBC: 4.31 MIL/uL (ref 4.22–5.81)
RDW: 13.6 % (ref 11.5–15.5)
WBC: 3.8 10*3/uL — ABNORMAL LOW (ref 4.0–10.5)
nRBC: 0 % (ref 0.0–0.2)

## 2023-05-12 LAB — SARS CORONAVIRUS 2 BY RT PCR: SARS Coronavirus 2 by RT PCR: NEGATIVE

## 2023-05-12 LAB — COMPREHENSIVE METABOLIC PANEL
ALT: 23 U/L (ref 0–44)
AST: 24 U/L (ref 15–41)
Albumin: 3.5 g/dL (ref 3.5–5.0)
Alkaline Phosphatase: 74 U/L (ref 38–126)
Anion gap: 9 (ref 5–15)
BUN: 8 mg/dL (ref 6–20)
CO2: 26 mmol/L (ref 22–32)
Calcium: 8.6 mg/dL — ABNORMAL LOW (ref 8.9–10.3)
Chloride: 97 mmol/L — ABNORMAL LOW (ref 98–111)
Creatinine, Ser: 1.05 mg/dL (ref 0.61–1.24)
GFR, Estimated: >60 mL/min (ref >60)
Glucose, Bld: 112 mg/dL — ABNORMAL HIGH (ref 70–99)
Potassium: 3.2 mmol/L — ABNORMAL LOW (ref 3.5–5.1)
Sodium: 132 mmol/L — ABNORMAL LOW (ref 135–145)
Total Bilirubin: 0.4 mg/dL (ref 0.3–1.2)
Total Protein: 7.5 g/dL (ref 6.5–8.1)

## 2023-05-12 LAB — PROTIME-INR
INR: 1.2 (ref 0.8–1.2)
Prothrombin Time: 15.6 s — ABNORMAL HIGH (ref 11.4–15.2)

## 2023-05-12 LAB — I-STAT CG4 LACTIC ACID, ED: Lactic Acid, Venous: 0.9 mmol/L (ref 0.5–1.9)

## 2023-05-12 MED ORDER — IBUPROFEN 800 MG PO TABS
800.0000 mg | ORAL_TABLET | Freq: Once | ORAL | Status: AC
  Administered 2023-05-12: 800 mg via ORAL
  Filled 2023-05-12: qty 1

## 2023-05-12 MED ORDER — ALBUTEROL SULFATE (2.5 MG/3ML) 0.083% IN NEBU: 3.0000 mL | INHALATION_SOLUTION | Freq: Four times a day (QID) | RESPIRATORY_TRACT | Status: DC | PRN

## 2023-05-12 MED ORDER — LACTATED RINGERS IV SOLN: INTRAVENOUS | Status: DC

## 2023-05-12 MED ORDER — ADULT MULTIVITAMIN W/MINERALS CH
1.0000 | ORAL_TABLET | Freq: Every day | ORAL | Status: DC
  Administered 2023-05-13 – 2023-05-16 (×4): 1 via ORAL
  Filled 2023-05-12 (×4): qty 1

## 2023-05-12 MED ORDER — LIDOCAINE-PRILOCAINE 2.5-2.5 % EX CREA: TOPICAL_CREAM | Freq: Once | CUTANEOUS | Status: DC

## 2023-05-12 MED ORDER — POLYETHYLENE GLYCOL 3350 17 G PO PACK: 17.0000 g | PACK | Freq: Every day | ORAL | Status: DC | PRN

## 2023-05-12 MED ORDER — METHYLPREDNISOLONE SODIUM SUCC 40 MG IJ SOLR
40.0000 mg | Freq: Every day | INTRAMUSCULAR | Status: AC
  Administered 2023-05-13 – 2023-05-14 (×3): 40 mg via INTRAVENOUS
  Filled 2023-05-12 (×3): qty 1

## 2023-05-12 MED ORDER — ENOXAPARIN SODIUM 40 MG/0.4ML IJ SOSY
40.0000 mg | PREFILLED_SYRINGE | INTRAMUSCULAR | Status: DC
  Administered 2023-05-13 – 2023-05-15 (×4): 40 mg via SUBCUTANEOUS
  Filled 2023-05-12 (×4): qty 0.4

## 2023-05-12 MED ORDER — SODIUM CHLORIDE 0.9 % IV SOLN
500.0000 mg | INTRAVENOUS | Status: DC
  Administered 2023-05-12 – 2023-05-15 (×4): 500 mg via INTRAVENOUS
  Filled 2023-05-12 (×6): qty 5

## 2023-05-12 MED ORDER — GUAIFENESIN 100 MG/5ML PO LIQD
5.0000 mL | ORAL | Status: DC | PRN
  Administered 2023-05-13: 5 mL via ORAL
  Filled 2023-05-12: qty 10

## 2023-05-12 MED ORDER — PROCHLORPERAZINE EDISYLATE 10 MG/2ML IJ SOLN: 5.0000 mg | Freq: Four times a day (QID) | INTRAMUSCULAR | Status: DC | PRN

## 2023-05-12 MED ORDER — IPRATROPIUM-ALBUTEROL 0.5-2.5 (3) MG/3ML IN SOLN
3.0000 mL | Freq: Four times a day (QID) | RESPIRATORY_TRACT | Status: DC
  Administered 2023-05-13: 3 mL via RESPIRATORY_TRACT
  Filled 2023-05-12: qty 3

## 2023-05-12 MED ORDER — SODIUM CHLORIDE 0.9 % IV SOLN
2.0000 g | INTRAVENOUS | Status: DC
  Administered 2023-05-12 – 2023-05-15 (×4): 2 g via INTRAVENOUS
  Filled 2023-05-12 (×4): qty 20

## 2023-05-12 MED ORDER — LACTATED RINGERS IV BOLUS (SEPSIS)
1000.0000 mL | Freq: Once | INTRAVENOUS | Status: AC
  Administered 2023-05-12: 1000 mL via INTRAVENOUS

## 2023-05-12 MED ORDER — LIDOCAINE 4 % EX CREA
TOPICAL_CREAM | Freq: Once | CUTANEOUS | Status: DC
Start: 2023-05-12 — End: 2023-05-12

## 2023-05-12 MED ORDER — ALBUTEROL SULFATE (2.5 MG/3ML) 0.083% IN NEBU: 2.5000 mg | INHALATION_SOLUTION | Freq: Four times a day (QID) | RESPIRATORY_TRACT | Status: DC | PRN

## 2023-05-12 MED ORDER — MELATONIN 5 MG PO TABS
5.0000 mg | ORAL_TABLET | Freq: Every evening | ORAL | Status: DC | PRN
  Administered 2023-05-13: 5 mg via ORAL
  Filled 2023-05-12: qty 1

## 2023-05-12 MED ORDER — ACETAMINOPHEN 325 MG PO TABS
650.0000 mg | ORAL_TABLET | Freq: Four times a day (QID) | ORAL | Status: DC | PRN
  Administered 2023-05-14: 650 mg via ORAL
  Filled 2023-05-12: qty 2

## 2023-05-12 MED ORDER — DULOXETINE HCL 20 MG PO CPEP
20.0000 mg | ORAL_CAPSULE | Freq: Every day | ORAL | Status: DC
  Administered 2023-05-13 – 2023-05-16 (×4): 20 mg via ORAL
  Filled 2023-05-12 (×5): qty 1

## 2023-05-12 MED ORDER — POTASSIUM CHLORIDE CRYS ER 20 MEQ PO TBCR
40.0000 meq | EXTENDED_RELEASE_TABLET | Freq: Once | ORAL | Status: AC
  Administered 2023-05-12: 40 meq via ORAL
  Filled 2023-05-12: qty 2

## 2023-05-12 MED ORDER — POTASSIUM CHLORIDE IN NACL 40-0.9 MEQ/L-% IV SOLN
INTRAVENOUS | Status: AC
  Filled 2023-05-12 (×2): qty 1000

## 2023-05-12 NOTE — ED Notes (Signed)
.ED TO INPATIENT HANDOFF REPORT  ED Nurse Name and Phone #: Lyndel Safe Name/Age/Gender Paul [name] 32 y.o. male Room/Bed: WA13/WA13  Code Status   Code Status: Full Code  Home/SNF/Other Home Patient oriented to: self, place, time, and situation Is this baseline? Yes   Triage Complete: Triage complete  Chief Complaint Pneumonia [J18.9]  Triage Note Non-Hodgkins lymphoma pt. Pt is receiving chemo every 2 weeks. Pt started having unwell feeling, weakness, and nausea starting last night. Went to ER and was swabbed for covid/flu/rsv  and was negative. Fever in triage. Tylenol given PTA.    Allergies Allergies  Allergen Reactions   Other Other (See Comments)    Fetal alcohol syndrome  Certain (unnamed) medications have adverse effects on him. Must have heavy anesthesia meds in order to work.    Level of Care/Admitting Diagnosis ED Disposition     ED Disposition  Admit   Condition  --   Comment  Hospital Area: [facility] [[id number]]  Level of Care: Progressive [102]  Admit to Progressive based on following criteria: MULTISYSTEM THREATS such as stable sepsis, metabolic/electrolyte imbalance with or without encephalopathy that is responding to early treatment.  Admit to Progressive based on following criteria: RESPIRATORY PROBLEMS hypoxemic/hypercapnic respiratory failure that is responsive to NIPPV (BiPAP) or High Flow Nasal Cannula (6-80 lpm). Frequent assessment/intervention, no > Q2 hrs < Q4 hrs, to maintain oxygenation and pulmonary hygiene.  May admit patient to [facility] or [facility] if equivalent level of care is available:: Yes  Covid Evaluation: Asymptomatic - no recent exposure (last 10 days) testing not required  Diagnosis: Pneumonia [227785]  Admitting Physician: Darlin Drop [[id number]]  Attending Physician: Darlin Drop [[id number]]  Certification:: I certify this patient will need inpatient services for at least 2 midnights   Expected Medical Readiness: 05/14/2023          B Medical/Surgery History Past Medical History:  Diagnosis Date   ADD (attention deficit disorder)    ADHD (attention deficit hyperactivity disorder)    Asthma    excercise induced and pollen   Auditory processing disorder    Complication of anesthesia    mother states pt. is harder to put under anes. and hard to wake up due to fetal alcohol syndrome; can be combative, per mother; states he will do better if mother is in PACU when he is coming out of anes.   Development delay    states mental status of a 32 year old   Exercise-induced asthma    prn inhaler/neb.   Fetal alcohol syndrome    Narcolepsy    Non-restorable tooth 09/2015   teeth   Separation anxiety    Twin birth    Past Surgical History:  Procedure Laterality Date   IR IMAGING GUIDED PORT INSERTION  08/11/2022   MULTIPLE EXTRACTIONS WITH ALVEOLOPLASTY N/A 09/23/2015   Procedure: MULTIPLE EXTRACTION WITH ALVEOLOPLASTY;  Surgeon: Ocie Doyne, DDS;  Location: [facility];  Service: Oral Surgery;  Laterality: N/A;   PYLOROMYOTOMY     age [age], performed found to not have stenosis on surgical exam   PYLOROMYOTOMY     did not have pyloric stenosis; did surgery on the wrong twin   RADIOLOGY WITH ANESTHESIA N/A 01/16/2020   Procedure: MRI WITH ANESTHESIA  BRAIN WITH AND WITHOUT CONTRAST;  Surgeon: Radiologist, Medication, MD;  Location: [facility] OR;  Service: Radiology;  Laterality: N/A;     A IV Location/Drains/Wounds Patient Lines/Drains/Airways Status  Active Line/Drains/Airways     Name Placement date Placement time Site Days   Implanted Port 08/11/22 Right Chest 08/11/22  1249  Chest  274   Wound / Incision (Open or Dehisced) 07/03/22 Puncture Sternum Lower 07/03/22  0928  Sternum  313            Intake/Output Last 24 hours No intake or output data in the 24 hours ending 05/12/23 2244  Labs/Imaging Results for orders placed or performed during  the hospital encounter of 05/12/23 (from the past 48 hour(s))  SARS Coronavirus 2 by RT PCR (hospital order, performed in [facility] hospital lab) *cepheid single result test* Anterior Nasal Swab     Status: None   Collection Time: 05/12/23  7:52 PM   Specimen: Anterior Nasal Swab  Result Value Ref Range   SARS Coronavirus 2 by RT PCR NEGATIVE NEGATIVE    Comment: (NOTE) SARS-CoV-2 target nucleic acids are NOT DETECTED.  The SARS-CoV-2 RNA is generally detectable in upper and lower respiratory specimens during the acute phase of infection. The lowest concentration of SARS-CoV-2 viral copies this assay can detect is 250 copies / mL. A negative result does not preclude SARS-CoV-2 infection and should not be used as the sole basis for treatment or other patient management decisions.  A negative result may occur with improper specimen collection / handling, submission of specimen other than nasopharyngeal swab, presence of viral mutation(s) within the areas targeted by this assay, and inadequate number of viral copies (<250 copies / mL). A negative result must be combined with clinical observations, patient history, and epidemiological information.  Fact Sheet for Patients:   RoadLapTop.co.za  Fact Sheet for Healthcare Providers: http://kim-miller.com/  This test is not yet approved or  cleared by the [place] FDA and has been authorized for detection and/or diagnosis of SARS-CoV-2 by FDA under an Emergency Use Authorization (EUA).  This EUA will remain in effect (meaning this test can be used) for the duration of the COVID-19 declaration under Section 564(b)(1) of the Act, 21 U.S.C. section 360bbb-3(b)(1), unless the authorization is terminated or revoked sooner.  Performed at [facility], [address] [name], [place] [address]   Comprehensive metabolic panel     Status: Abnormal   Collection Time: 05/12/23   8:48 PM  Result Value Ref Range   Sodium 132 (L) 135 - 145 mmol/L   Potassium 3.2 (L) 3.5 - 5.1 mmol/L   Chloride 97 (L) 98 - 111 mmol/L   CO2 26 22 - 32 mmol/L   Glucose, Bld 112 (H) 70 - 99 mg/dL    Comment: Glucose reference range applies only to samples taken after fasting for at least 8 hours.   BUN 8 6 - 20 mg/dL   Creatinine, Ser 7.82 0.61 - 1.24 mg/dL   Calcium 8.6 (L) 8.9 - 10.3 mg/dL   Total Protein 7.5 6.5 - 8.1 g/dL   Albumin 3.5 3.5 - 5.0 g/dL   AST 24 15 - 41 U/L   ALT 23 0 - 44 U/L   Alkaline Phosphatase 74 38 - 126 U/L   Total Bilirubin 0.4 0.3 - 1.2 mg/dL   GFR, Estimated >95 >62 mL/min    Comment: (NOTE) Calculated using the CKD-EPI Creatinine Equation (2021)    Anion gap 9 5 - 15    Comment: Performed at [facility], [address], [place], [place] [address]  CBC with Differential     Status: Abnormal   Collection Time:  05/12/23  8:48 PM  Result Value Ref Range   WBC 3.8 (L) 4.0 - 10.5 K/uL   RBC 4.31 4.22 - 5.81 MIL/uL   Hemoglobin 12.4 (L) 13.0 - 17.0 g/dL   HCT 52.8 (L) 41.3 - 24.4 %   MCV 86.8 80.0 - 100.0 fL   MCH 28.8 26.0 - 34.0 pg   MCHC 33.2 30.0 - 36.0 g/dL   RDW 01.0 27.2 - 53.6 %   Platelets 156 150 - 400 K/uL   nRBC 0.0 0.0 - 0.2 %   Neutrophils Relative % 68 %   Neutro Abs 2.6 1.7 - 7.7 K/uL   Lymphocytes Relative 21 %   Lymphs Abs 0.8 0.7 - 4.0 K/uL   Monocytes Relative 10 %   Monocytes Absolute 0.4 0.1 - 1.0 K/uL   Eosinophils Relative 1 %   Eosinophils Absolute 0.0 0.0 - 0.5 K/uL   Basophils Relative 0 %   Basophils Absolute 0.0 0.0 - 0.1 K/uL   Immature Granulocytes 0 %   Abs Immature Granulocytes 0.01 0.00 - 0.07 K/uL    Comment: Performed at [facility], [address] [place], [place] [address]  Protime-INR     Status: Abnormal   Collection Time: 05/12/23  8:48 PM  Result Value Ref Range   Prothrombin Time 15.6 (H) 11.4 - 15.2 seconds   INR 1.2 0.8 - 1.2    Comment: (NOTE) INR goal  varies based on device and disease states. Performed at [facility], [address], [place], [place] [address]   I-Stat Lactic Acid, ED     Status: None   Collection Time: 05/12/23  9:01 PM  Result Value Ref Range   Lactic Acid, Venous 0.9 0.5 - 1.9 mmol/L   DG Chest 2 View  Result Date: 05/12/2023 CLINICAL DATA:  Suspected sepsis. Non-Hodgkin's lymphoma receiving chemo every 2 weeks. Feeling unwell with weakness and nausea since last night. Fever. EXAM: CHEST - 2 VIEW COMPARISON:  05/12/2023 at 4:44 a.m. FINDINGS: Right chest wall Port-A-Cath with tip at the superior cavoatrial junction. Stable cardiomediastinal silhouette. Fullness about the superior mediastinum and along the right heart border consistent with known adenopathy. Slightly improved airspace opacities in the right upper lung compared to earlier today. No pleural effusion or pneumothorax. IMPRESSION: Decreased airspace opacities in the right upper lobe since earlier today, consistent with improving atelectasis or pneumonia. Electronically Signed   By: Minerva Fester M.D.   On: 05/12/2023 20:34    Pending Labs Unresulted Labs (From admission, onward)     Start     Ordered   05/19/23 0500  Creatinine, serum  (enoxaparin (LOVENOX)    CrCl >/= 30 ml/min)  Weekly,   R     Comments: while on enoxaparin therapy    05/12/23 2235   05/12/23 2236  CBC  (enoxaparin (LOVENOX)    CrCl >/= 30 ml/min)  Once,   R       Comments: Baseline for enoxaparin therapy IF NOT ALREADY DRAWN.  Notify MD if PLT < 100 K.    05/12/23 2235   05/12/23 2236  Creatinine, serum  (enoxaparin (LOVENOX)    CrCl >/= 30 ml/min)  Once,   R       Comments: Baseline for enoxaparin therapy IF NOT ALREADY DRAWN.    05/12/23 2235   05/12/23 1946  Culture, blood (Routine x 2)  BLOOD CULTURE X 2,   R (with STAT occurrences)      05/12/23 1946  05/12/23 1946  Urinalysis, w/ Reflex to Culture (Infection Suspected) -Urine, Clean Catch  Once,   URGENT        Question:  Specimen Source  Answer:  Urine, Clean Catch   05/12/23 1946            Vitals/Pain Today's Vitals   05/12/23 2034 05/12/23 2115 05/12/23 2200 05/12/23 2234  BP:  119/71 122/73   Pulse:  96 (!) 105   Resp:  (!) 25 (!) 26   Temp: (!) 102.3 F (39.1 C)   100.2 F (37.9 C)  TempSrc: Oral   Oral  SpO2:  97% 95%   Weight:      Height:      PainSc:        Isolation Precautions No active isolations  Medications Medications  lidocaine-prilocaine (EMLA) cream ( Topical Not Given 05/12/23 2114)  lactated ringers infusion (has no administration in time range)  lactated ringers bolus 1,000 mL (1,000 mLs Intravenous New Bag/Given 05/12/23 2147)  cefTRIAXone (ROCEPHIN) 2 g in sodium chloride 0.9 % 100 mL IVPB (0 g Intravenous Stopped 05/12/23 2144)  azithromycin (ZITHROMAX) 500 mg in sodium chloride 0.9 % 250 mL IVPB (500 mg Intravenous New Bag/Given 05/12/23 2145)  enoxaparin (LOVENOX) injection 40 mg (has no administration in time range)  potassium chloride SA (KLOR-CON M) CR tablet 40 mEq (40 mEq Oral Given 05/12/23 2229)  ibuprofen (ADVIL) tablet 800 mg (800 mg Oral Given 05/12/23 2238)    Mobility walks     Focused Assessments Sepsis   R Recommendations: See Admitting Provider Note  Report given to:   Additional Notes:

## 2023-05-12 NOTE — H&P (Signed)
History and Physical  Paul [name]:[id number] DOB: 04/07/1991 DOA: 05/12/2023  Referring physician: Dr. Particia Nearing, EDP  PCP: Mattie Marlin, DO  Outpatient Specialists: Medical oncology Dr. Candise Che Patient coming from: Home  Chief Complaint: Persistent cough   HPI: Paul [name] is a 32 y.o. male with medical history significant for narcolepsy, asthma, fetal alcohol syndrome, ADHD, sleep apnea, recent diagnosis of Hodgkin's lymphoma, last treatment with nivolumab on 05/04/2023, who presents from home with complaints of progressively worsening productive cough x 5 days, associated with subjective fevers and chills since yesterday.  The patient was seen at [facility] ED earlier this morning where he was given a dose of Rocephin and azithromycin in the ED then he was discharged home on oral antibiotics.  Due to worsening symptoms he presented to [facility] ED for further evaluation.  In the ED, workup revealed pneumonia.  The patient was started on empiric IV antibiotics for CAP.  Febrile with Tmax of 102.3.  Peripheral blood cultures x 2 were obtained.  TRH, hospitalist service, was asked to admit.  ED Course: Tmax 102.3.  BP 111/65, pulse 94, respiratory 18, saturation 95% on room air.  Lab studies notable for WBC 3.8, hemoglobin 12.4.  Serum sodium 132, potassium 3.2, glucose 112.  Review of Systems: Review of systems as noted in the HPI. All other systems reviewed and are negative.   Past Medical History:  Diagnosis Date   ADD (attention deficit disorder)    ADHD (attention deficit hyperactivity disorder)    Asthma    excercise induced and pollen   Auditory processing disorder    Complication of anesthesia    mother states pt. is harder to put under anes. and hard to wake up due to fetal alcohol syndrome; can be combative, per mother; states he will do better if mother is in PACU when he is coming out of anes.   Development delay    states mental status of a  32 year old   Exercise-induced asthma    prn inhaler/neb.   Fetal alcohol syndrome    Narcolepsy    Non-restorable tooth 09/2015   teeth   Separation anxiety    Twin birth    Past Surgical History:  Procedure Laterality Date   IR IMAGING GUIDED PORT INSERTION  08/11/2022   MULTIPLE EXTRACTIONS WITH ALVEOLOPLASTY N/A 09/23/2015   Procedure: MULTIPLE EXTRACTION WITH ALVEOLOPLASTY;  Surgeon: Ocie Doyne, DDS;  Location: [facility];  Service: Oral Surgery;  Laterality: N/A;   PYLOROMYOTOMY     age [age], performed found to not have stenosis on surgical exam   PYLOROMYOTOMY     did not have pyloric stenosis; did surgery on the wrong twin   RADIOLOGY WITH ANESTHESIA N/A 01/16/2020   Procedure: MRI WITH ANESTHESIA  BRAIN WITH AND WITHOUT CONTRAST;  Surgeon: Radiologist, Medication, MD;  Location: [facility] OR;  Service: Radiology;  Laterality: N/A;    Social History:  reports that he has never smoked. He has never used smokeless tobacco. He reports that he does not drink alcohol and does not use drugs.   Allergies  Allergen Reactions   Other Other (See Comments)    Fetal alcohol syndrome  Certain (unnamed) medications have adverse effects on him. Must have heavy anesthesia meds in order to work.    Family History  Adopted: Yes  Problem Relation Age of Onset   Mental illness Mother    Diabetes Mother    Hypertension Mother    Schizophrenia Mother  Bipolar disorder Mother    Mental retardation Father    Sleep apnea Neg Hx       Prior to Admission medications   Medication Sig Start Date End Date Taking? Authorizing Provider  albuterol (PROVENTIL HFA;VENTOLIN HFA) 108 (90 BASE) MCG/ACT inhaler Inhale 2 puffs into the lungs every 6 (six) hours as needed for wheezing or shortness of breath.   Yes [provider]  albuterol (PROVENTIL) (5 MG/ML) 0.5% nebulizer solution Take 2.5 mg by nebulization every 6 (six) hours as needed for wheezing or shortness of breath.    Yes [provider]  DULoxetine (CYMBALTA) 20 MG capsule Take 1 capsule (20 mg total) by mouth daily. 05/04/23  Yes Johney Maine, MD  ibuprofen (ADVIL) 800 MG tablet Take 800 mg by mouth every 8 (eight) hours as needed for mild pain or headache.   Yes [provider]  lidocaine-prilocaine (EMLA) cream Apply to affected area once Patient taking differently: Apply 1 Application topically as needed (for port access). 03/15/23  Yes Johney Maine, MD  loratadine (CLARITIN) 10 MG tablet Take 10 mg by mouth daily as needed for allergies or rhinitis.   Yes [provider]  Multiple Vitamin (MULTIVITAMIN WITH MINERALS) TABS tablet Take 1 tablet by mouth daily with breakfast.   Yes [provider]  prochlorperazine (COMPAZINE) 10 MG tablet Take 1 tablet (10 mg total) by mouth every 6 (six) hours as needed for nausea or vomiting. 03/15/23  Yes Johney Maine, MD  Vitamin D, Ergocalciferol, (DRISDOL) 1.25 MG (50000 UNIT) CAPS capsule Take 50,000 Units by mouth every Monday.   Yes [provider]  dexamethasone (DECADRON) 4 MG tablet Take 2 tablets by mouth once a day for 3 days after chemotherapy. Take with food. Patient not taking: Reported on 05/12/2023 09/09/22   Johney Maine, MD  mometasone-formoterol [facility]) 200-5 MCG/ACT AERO Inhale 2 puffs into the lungs 2 (two) times daily. Patient not taking: Reported on 05/12/2023 07/03/22   Pokhrel, Paul [place], MD  olopatadine (PATADAY) 0.1 % ophthalmic solution Place 1 drop into both eyes 2 (two) times daily. Patient not taking: Reported on 05/12/2023 03/01/23   Johney Maine, MD  ondansetron (ZOFRAN) 4 MG tablet Take 1 tablet (4 mg total) by mouth every 6 (six) hours as needed for nausea. 09/09/22   Johney Maine, MD  triamcinolone (KENALOG) 0.025 % ointment Apply 1 Application topically 2 (two) times daily. To rash on hands Patient not taking: Reported on 05/12/2023 10/06/22   Johney Maine,  MD  urea (CARMOL) 10 % cream Apply topically 2 (two) times daily. To hands and feet Patient not taking: Reported on 05/12/2023 10/06/22   Johney Maine, MD    Physical Exam: BP 111/65 (BP Location: Right Arm)   Pulse 94   Temp 100.1 F (37.8 C) (Oral)   Resp 18   Ht 5\' 10"  (1.778 m)   Wt 108.9 kg   SpO2 95%   BMI 34.44 kg/m   General: 32 y.o. year-old male well developed well nourished in no acute distress.  Alert and oriented x3. Cardiovascular: Regular rate and rhythm with no rubs or gallops.  No thyromegaly or JVD noted.  No lower extremity edema. 2/4 pulses in all 4 extremities. Respiratory: Mild rales at bases.  Diffuse wheezing bilaterally. Good inspiratory effort. Abdomen: Soft nontender nondistended with normal bowel sounds x4 quadrants. Muskuloskeletal: No cyanosis, clubbing or edema noted bilaterally Neuro: CN II-XII intact, strength, sensation, reflexes Skin: No ulcerative  lesions noted or rashes Psychiatry: Judgement and insight appear normal. Mood is appropriate for condition and setting          Labs on Admission:  Basic Metabolic Panel: Recent Labs  Lab 05/12/23 2048  NA 132*  K 3.2*  CL 97*  CO2 26  GLUCOSE 112*  BUN 8  CREATININE 1.05  CALCIUM 8.6*   Liver Function Tests: Recent Labs  Lab 05/12/23 2048  AST 24  ALT 23  ALKPHOS 74  BILITOT 0.4  PROT 7.5  ALBUMIN 3.5   No results for input(s): "LIPASE", "AMYLASE" in the last 168 hours. No results for input(s): "AMMONIA" in the last 168 hours. CBC: Recent Labs  Lab 05/12/23 2048  WBC 3.8*  NEUTROABS 2.6  HGB 12.4*  HCT 37.4*  MCV 86.8  PLT 156   Cardiac Enzymes: No results for input(s): "CKTOTAL", "CKMB", "CKMBINDEX", "TROPONINI" in the last 168 hours.  BNP (last 3 results) No results for input(s): "BNP" in the last 8760 hours.  ProBNP (last 3 results) No results for input(s): "PROBNP" in the last 8760 hours.  CBG: No results for input(s): "GLUCAP" in the last 168  hours.  Radiological Exams on Admission: DG Chest 2 View  Result Date: 05/12/2023 CLINICAL DATA:  Suspected sepsis. Non-Hodgkin's lymphoma receiving chemo every 2 weeks. Feeling unwell with weakness and nausea since last night. Fever. EXAM: CHEST - 2 VIEW COMPARISON:  05/12/2023 at 4:44 a.m. FINDINGS: Right chest wall Port-A-Cath with tip at the superior cavoatrial junction. Stable cardiomediastinal silhouette. Fullness about the superior mediastinum and along the right heart border consistent with known adenopathy. Slightly improved airspace opacities in the right upper lung compared to earlier today. No pleural effusion or pneumothorax. IMPRESSION: Decreased airspace opacities in the right upper lobe since earlier today, consistent with improving atelectasis or pneumonia. Electronically Signed   By: Minerva Fester M.D.   On: 05/12/2023 20:34    EKG: I independently viewed the EKG done and my findings are as followed: Sinus tachycardia rate of 104.  Nonspecific ST-T changes.  QTc 412.  Assessment/Plan Present on Admission:  Pneumonia  Principal Problem:   Pneumonia  Sepsis secondary to community-acquired pneumonia, POA Presented with persistent productive cough, subjective fevers and chills. Leukopenia with WBC 3.8, febrile with Tmax 102.3, tachypnea with respiratory rate 35. Started on empiric IV antibiotics for CAP, Rocephin and IV azithromycin. DuoNebs every 6 hours, as needed antitussives Pulmonary toilet twice daily Incentive spirometer Maintain MAP greater than 65 Early mobilization Follow peripheral blood cultures x 2 and sputum culture  Asthma exacerbation secondary to pneumonia Continue to treat underlying conditions Add IV Solu-Medrol 40 mg daily x 3 days for wheezing Continue scheduled bronchodilators Early mobilization  Recently diagnosed Hodgkin lymphoma on therapy Last therapy with nivolumab on 05/04/2023 Follows with Dr. Candise Che, medical oncology, outpatient  Chronic  anxiety/depression Resume home regimen   Time: 75 minutes.    DVT prophylaxis: Subcu Lovenox daily  Code Status: Full code  Family Communication: Father at bedside.  Updated patient's father at bedside and mother via phone.  Disposition Plan: Admitted to progressive care unit.  Consults called: None.  Admission status: Inpatient status.   Status is: Inpatient The patient requires at least 2 midnights for further evaluation and treatment of present condition.   Darlin Drop MD [facility] Pager [phone]  If 7PM-7AM, please contact night-coverage [email or web address] Password [facility]  05/12/2023, 11:36 PM

## 2023-05-12 NOTE — ED Provider Triage Note (Signed)
Emergency Medicine Provider Triage Evaluation Note  Paul [name] , a 32 y.o. male  was evaluated in triage.  Pt complains of fever, generalized weakness, nausea, and one episode of diarrhea for 2 days. Seen at outlying ED and diagnosed with PNA last night. Started antibiotics today. No known sick contacts.  Review of Systems  Positive: See HPI Negative: See HPI  Physical Exam  BP 112/73 (BP Location: Left Arm)   Pulse 96   Temp (!) 102.1 F (38.9 C) (Oral)   Resp 18   Ht 5\' 10"  (1.778 m)   Wt 108.9 kg   SpO2 100%   BMI 34.44 kg/m  Gen:   Awake, ill appearing Resp:  Normal effort, rales to right lung fields, no wheezing MSK:   Moves extremities without difficulty  Other:  Alert, abdomen soft and nontender  Medical Decision Making  Medically screening exam initiated at 7:46 PM.  Appropriate orders placed.  [name] was informed that the remainder of the evaluation will be completed by another provider, this initial triage assessment does not replace that evaluation, and the importance of remaining in the ED until their evaluation is complete.     Tonette Lederer, PA-C 05/12/23 1949

## 2023-05-12 NOTE — ED Triage Notes (Signed)
Non-Hodgkins lymphoma pt. Pt is receiving chemo every 2 weeks. Pt started having unwell feeling, weakness, and nausea starting last night. Went to ER and was swabbed for covid/flu/rsv  and was negative. Fever in triage. Tylenol given PTA.

## 2023-05-12 NOTE — ED Provider Notes (Signed)
[place] EMERGENCY DEPARTMENT AT [facility] Provider Note   CSN: [id number] Arrival date & time: 05/12/23  1748     History  Chief Complaint  Patient presents with   Code Sepsis    Paul [name] is a 32 y.o. male.  Pt is a 32 yo male with pmhx significant for narcolepsy, asthma, fetal alcohol syndrome, adhd, sleep apnea, and recent dx of Hodgkin's lymphoma.  Pt did have an infusion of nivolumab on 8/27.  He is followed at the cancer center here by Dr. Candise Che. Most of the history is obtained by his adoptive father due to his developmental delay.   Pt has had fevers and chills starting yesterday.  He was seen in the ED at [facility] early this am and was dx'd with pna.  He was given 1 dose of rocephin/zithromax in the ED.  Pt was d/c home and has felt worsen.  He now has chills and worsening of fever.  Pt denies any pain.           Home Medications Prior to Admission medications   Medication Sig Start Date End Date Taking? Authorizing Provider  albuterol (PROVENTIL HFA;VENTOLIN HFA) 108 (90 BASE) MCG/ACT inhaler Inhale 2 puffs into the lungs every 6 (six) hours as needed for wheezing or shortness of breath.   Yes [provider]  albuterol (PROVENTIL) (5 MG/ML) 0.5% nebulizer solution Take 2.5 mg by nebulization every 6 (six) hours as needed for wheezing or shortness of breath.   Yes [provider]  DULoxetine (CYMBALTA) 20 MG capsule Take 1 capsule (20 mg total) by mouth daily. 05/04/23  Yes Johney Maine, MD  ibuprofen (ADVIL) 800 MG tablet Take 800 mg by mouth every 8 (eight) hours as needed for mild pain or headache.   Yes [provider]  lidocaine-prilocaine (EMLA) cream Apply to affected area once Patient taking differently: Apply 1 Application topically as needed (for port access). 03/15/23  Yes Johney Maine, MD  loratadine (CLARITIN) 10 MG tablet Take 10 mg by mouth daily as needed for allergies or rhinitis.   Yes  [provider]  Multiple Vitamin (MULTIVITAMIN WITH MINERALS) TABS tablet Take 1 tablet by mouth daily with breakfast.   Yes [provider]  prochlorperazine (COMPAZINE) 10 MG tablet Take 1 tablet (10 mg total) by mouth every 6 (six) hours as needed for nausea or vomiting. 03/15/23  Yes Johney Maine, MD  Vitamin D, Ergocalciferol, (DRISDOL) 1.25 MG (50000 UNIT) CAPS capsule Take 50,000 Units by mouth every Monday.   Yes [provider]  dexamethasone (DECADRON) 4 MG tablet Take 2 tablets by mouth once a day for 3 days after chemotherapy. Take with food. Patient not taking: Reported on 05/12/2023 09/09/22   Johney Maine, MD  mometasone-formoterol [employer]) 200-5 MCG/ACT AERO Inhale 2 puffs into the lungs 2 (two) times daily. Patient not taking: Reported on 05/12/2023 07/03/22   Pokhrel, Rebekah Chesterfield, MD  olopatadine (PATADAY) 0.1 % ophthalmic solution Place 1 drop into both eyes 2 (two) times daily. Patient not taking: Reported on 05/12/2023 03/01/23   Johney Maine, MD  ondansetron (ZOFRAN) 4 MG tablet Take 1 tablet (4 mg total) by mouth every 6 (six) hours as needed for nausea. 09/09/22   Johney Maine, MD  triamcinolone (KENALOG) 0.025 % ointment Apply 1 Application topically 2 (two) times daily. To rash on hands Patient not taking: Reported on 05/12/2023 10/06/22   Johney Maine, MD  urea (CARMOL) 10 %  cream Apply topically 2 (two) times daily. To hands and feet Patient not taking: Reported on 05/12/2023 10/06/22   Johney Maine, MD      Allergies    Other    Review of Systems   Review of Systems  Constitutional:  Positive for chills and fever.  Respiratory:  Positive for cough.   All other systems reviewed and are negative.   Physical Exam Updated Vital Signs BP 122/73   Pulse (!) 105   Temp (!) 102.3 F (39.1 C) (Oral)   Resp (!) 26   Ht 5\' 10"  (1.778 m)   Wt 108.9 kg   SpO2 95%   BMI 34.44 kg/m  Physical Exam Vitals and  nursing note reviewed.  Constitutional:      Appearance: Normal appearance. He is obese.  HENT:     Head: Normocephalic and atraumatic.     Right Ear: External ear normal.     Left Ear: External ear normal.     Nose: Nose normal.     Mouth/Throat:     Mouth: Mucous membranes are moist.     Pharynx: Oropharynx is clear.  Eyes:     Extraocular Movements: Extraocular movements intact.     Conjunctiva/sclera: Conjunctivae normal.     Pupils: Pupils are equal, round, and reactive to light.  Cardiovascular:     Rate and Rhythm: Regular rhythm. Tachycardia present.     Pulses: Normal pulses.     Heart sounds: Normal heart sounds.  Pulmonary:     Effort: Pulmonary effort is normal.     Breath sounds: Rhonchi present.  Abdominal:     General: Abdomen is flat. Bowel sounds are normal.     Palpations: Abdomen is soft.  Musculoskeletal:        General: Normal range of motion.     Cervical back: Normal range of motion and neck supple.  Skin:    General: Skin is warm.     Capillary Refill: Capillary refill takes less than 2 seconds.  Neurological:     General: No focal deficit present.     Mental Status: He is alert and oriented to person, place, and time.  Psychiatric:        Mood and Affect: Mood normal.        Behavior: Behavior normal.     ED Results / Procedures / Treatments   Labs (all labs ordered are listed, but only abnormal results are displayed) Labs Reviewed  COMPREHENSIVE METABOLIC PANEL - Abnormal; Notable for the following components:      Result Value   Sodium 132 (*)    Potassium 3.2 (*)    Chloride 97 (*)    Glucose, Bld 112 (*)    Calcium 8.6 (*)    All other components within normal limits  CBC WITH DIFFERENTIAL/PLATELET - Abnormal; Notable for the following components:   WBC 3.8 (*)    Hemoglobin 12.4 (*)    HCT 37.4 (*)    All other components within normal limits  PROTIME-INR - Abnormal; Notable for the following components:   Prothrombin Time 15.6  (*)    All other components within normal limits  SARS CORONAVIRUS 2 BY RT PCR  CULTURE, BLOOD (ROUTINE X 2)  CULTURE, BLOOD (ROUTINE X 2)  URINALYSIS, W/ REFLEX TO CULTURE (INFECTION SUSPECTED)  I-STAT CG4 LACTIC ACID, ED    EKG EKG Interpretation Date/Time:  Wednesday May 12 2023 21:05:32 EDT Ventricular Rate:  104 PR Interval:  151 QRS Duration:  103 QT Interval:  313 QTC Calculation: 412 R Axis:   87  Text Interpretation: Sinus tachycardia Probable left atrial enlargement RSR' in V1 or V2, right VCD or RVH No significant change since last tracing Confirmed by Jacalyn Lefevre [phone]) on 05/12/2023 10:19:55 PM  Radiology DG Chest 2 View  Result Date: 05/12/2023 CLINICAL DATA:  Suspected sepsis. Non-Hodgkin's lymphoma receiving chemo every 2 weeks. Feeling unwell with weakness and nausea since last night. Fever. EXAM: CHEST - 2 VIEW COMPARISON:  05/12/2023 at 4:44 a.m. FINDINGS: Right chest wall Port-A-Cath with tip at the superior cavoatrial junction. Stable cardiomediastinal silhouette. Fullness about the superior mediastinum and along the right heart border consistent with known adenopathy. Slightly improved airspace opacities in the right upper lung compared to earlier today. No pleural effusion or pneumothorax. IMPRESSION: Decreased airspace opacities in the right upper lobe since earlier today, consistent with improving atelectasis or pneumonia. Electronically Signed   By: Minerva Fester M.D.   On: 05/12/2023 20:34    Procedures Procedures    Medications Ordered in ED Medications  lidocaine-prilocaine (EMLA) cream ( Topical Not Given 05/12/23 2114)  lactated ringers infusion (has no administration in time range)  lactated ringers bolus 1,000 mL (1,000 mLs Intravenous New Bag/Given 05/12/23 2147)  cefTRIAXone (ROCEPHIN) 2 g in sodium chloride 0.9 % 100 mL IVPB (0 g Intravenous Stopped 05/12/23 2144)  azithromycin (ZITHROMAX) 500 mg in sodium chloride 0.9 % 250 mL IVPB (500 mg  Intravenous New Bag/Given 05/12/23 2145)  ibuprofen (ADVIL) tablet 800 mg (has no administration in time range)  potassium chloride SA (KLOR-CON M) CR tablet 40 mEq (40 mEq Oral Given 05/12/23 2229)    ED Course/ Medical Decision Making/ A&P                                 Medical Decision Making Amount and/or Complexity of Data Reviewed Labs: ordered. Radiology: ordered. ECG/medicine tests: ordered.  Risk Prescription drug management. Decision regarding hospitalization.   This patient presents to the ED for concern of fever, this involves an extensive number of treatment options, and is a complaint that carries with it a high risk of complications and morbidity.  The differential diagnosis includes sepsis, infection, covid   Co morbidities that complicate the patient evaluation  narcolepsy, asthma, fetal alcohol syndrome, adhd, sleep apnea, and recent dx of Hodgkin's lymphoma   Additional history obtained:  Additional history obtained from epic chart review External records from outside source obtained and reviewed including father   Lab Tests:  I Ordered, and personally interpreted labs.  The pertinent results include:  covid neg, cbc with wbc sl low at 3.8, hgb low at 12.4, cmp with k low at 3.2, lactic 0.9, inr 1.2   Imaging Studies ordered:  I ordered imaging studies including cxr  I independently visualized and interpreted imaging which showed  Decreased airspace opacities in the right upper lobe since earlier  today, consistent with improving atelectasis or pneumonia.   I agree with the radiologist interpretation   Cardiac Monitoring:  The patient was maintained on a cardiac monitor.  I personally viewed and interpreted the cardiac monitored which showed an underlying rhythm of: st   Medicines ordered and prescription drug management:  I ordered medication including rocephin/zithromax  for pna  Reevaluation of the patient after these medicines showed that  the patient improved I have reviewed the patients home medicines and have made adjustments as needed  Test Considered:  ct   Critical Interventions:  abx   Consultations Obtained:  I requested consultation with the hospitalist (Dr. Margo Aye),  and discussed lab and imaging findings as well as pertinent plan - she will admit  Problem List / ED Course:  Sepsis:  pt meets sepsis criteria with temp, hr, and low wbc.  Pt is not in septic shock, so he did not need sepsis fluids.  He was given 1L LR.  He was given rocephin/zithromax.  He will need admission overnight.   Reevaluation:  After the interventions noted above, I reevaluated the patient and found that they have :improved   Social Determinants of Health:  Lives at home   Dispostion:  After consideration of the diagnostic results and the patients response to treatment, I feel that the patent would benefit from admission.    CRITICAL CARE Performed by: Jacalyn Lefevre   Total critical care time: 30 minutes  Critical care time was exclusive of separately billable procedures and treating other patients.  Critical care was necessary to treat or prevent imminent or life-threatening deterioration.  Critical care was time spent personally by me on the following activities: development of treatment plan with patient and/or surrogate as well as nursing, discussions with consultants, evaluation of patient's response to treatment, examination of patient, obtaining history from patient or surrogate, ordering and performing treatments and interventions, ordering and review of laboratory studies, ordering and review of radiographic studies, pulse oximetry and re-evaluation of patient's condition.         Final Clinical Impression(s) / ED Diagnoses Final diagnoses:  Sepsis without acute organ dysfunction, due to unspecified organism [facility])  Community acquired pneumonia of right upper lobe of lung  Hodgkin lymphoma of lymph  nodes of multiple regions, unspecified Hodgkin lymphoma type (HCC)  Hypokalemia    Rx / DC Orders ED Discharge Orders     None         Jacalyn Lefevre, MD 05/12/23 2233

## 2023-05-13 DIAGNOSIS — A419 Sepsis, unspecified organism: Secondary | ICD-10-CM

## 2023-05-13 DIAGNOSIS — R6520 Severe sepsis without septic shock: Secondary | ICD-10-CM

## 2023-05-13 LAB — EXPECTORATED SPUTUM ASSESSMENT W GRAM STAIN, RFLX TO RESP C

## 2023-05-13 LAB — URINALYSIS, W/ REFLEX TO CULTURE (INFECTION SUSPECTED)
Bacteria, UA: NONE SEEN
Bilirubin Urine: NEGATIVE
Glucose, UA: NEGATIVE mg/dL
Ketones, ur: 5 mg/dL — AB
Leukocytes,Ua: NEGATIVE
Nitrite: NEGATIVE
Protein, ur: 100 mg/dL — AB
Specific Gravity, Urine: 1.014 (ref 1.005–1.030)
pH: 6 (ref 5.0–8.0)

## 2023-05-13 LAB — BASIC METABOLIC PANEL
Anion gap: 9 (ref 5–15)
BUN: 8 mg/dL (ref 6–20)
CO2: 25 mmol/L (ref 22–32)
Calcium: 8.8 mg/dL — ABNORMAL LOW (ref 8.9–10.3)
Chloride: 102 mmol/L (ref 98–111)
Creatinine, Ser: 0.93 mg/dL (ref 0.61–1.24)
GFR, Estimated: >60 mL/min (ref >60)
Glucose, Bld: 123 mg/dL — ABNORMAL HIGH (ref 70–99)
Potassium: 4.1 mmol/L (ref 3.5–5.1)
Sodium: 136 mmol/L (ref 135–145)

## 2023-05-13 LAB — CBC
HCT: 37.1 % — ABNORMAL LOW (ref 39.0–52.0)
Hemoglobin: 12.4 g/dL — ABNORMAL LOW (ref 13.0–17.0)
MCH: 29.2 pg (ref 26.0–34.0)
MCHC: 33.4 g/dL (ref 30.0–36.0)
MCV: 87.5 fL (ref 80.0–100.0)
Platelets: 156 10*3/uL (ref 150–400)
RBC: 4.24 MIL/uL (ref 4.22–5.81)
RDW: 13.7 % (ref 11.5–15.5)
WBC: 3.4 10*3/uL — ABNORMAL LOW (ref 4.0–10.5)
nRBC: 0 % (ref 0.0–0.2)

## 2023-05-13 LAB — MAGNESIUM: Magnesium: 2.1 mg/dL (ref 1.7–2.4)

## 2023-05-13 LAB — PHOSPHORUS: Phosphorus: 2.6 mg/dL (ref 2.5–4.6)

## 2023-05-13 MED ORDER — SODIUM CHLORIDE 3 % IN NEBU
4.0000 mL | INHALATION_SOLUTION | Freq: Two times a day (BID) | RESPIRATORY_TRACT | Status: DC
  Filled 2023-05-13: qty 4

## 2023-05-13 MED ORDER — ORAL CARE MOUTH RINSE: 15.0000 mL | OROMUCOSAL | Status: DC | PRN

## 2023-05-13 MED ORDER — SODIUM CHLORIDE 3 % IN NEBU
4.0000 mL | INHALATION_SOLUTION | Freq: Two times a day (BID) | RESPIRATORY_TRACT | Status: AC
  Administered 2023-05-13 – 2023-05-15 (×5): 4 mL via RESPIRATORY_TRACT
  Filled 2023-05-13 (×5): qty 4

## 2023-05-13 MED ORDER — CHLORHEXIDINE GLUCONATE CLOTH 2 % EX PADS
6.0000 | MEDICATED_PAD | Freq: Every day | CUTANEOUS | Status: DC
  Administered 2023-05-13 – 2023-05-15 (×2): 6 via TOPICAL

## 2023-05-13 MED ORDER — SODIUM CHLORIDE 0.9% FLUSH
10.0000 mL | INTRAVENOUS | Status: DC | PRN
  Administered 2023-05-16: 10 mL

## 2023-05-13 MED ORDER — IPRATROPIUM-ALBUTEROL 0.5-2.5 (3) MG/3ML IN SOLN
3.0000 mL | Freq: Three times a day (TID) | RESPIRATORY_TRACT | Status: DC
  Administered 2023-05-13 – 2023-05-16 (×10): 3 mL via RESPIRATORY_TRACT
  Filled 2023-05-13 (×10): qty 3

## 2023-05-13 MED ORDER — GUAIFENESIN ER 600 MG PO TB12
600.0000 mg | ORAL_TABLET | Freq: Two times a day (BID) | ORAL | Status: AC
  Administered 2023-05-13 – 2023-05-15 (×6): 600 mg via ORAL
  Filled 2023-05-13 (×6): qty 1

## 2023-05-13 MED ORDER — SODIUM CHLORIDE 0.9% FLUSH
10.0000 mL | Freq: Two times a day (BID) | INTRAVENOUS | Status: DC
  Administered 2023-05-13 – 2023-05-15 (×2): 10 mL

## 2023-05-13 NOTE — Progress Notes (Signed)
   05/13/23 2252  BiPAP/CPAP/SIPAP  $ Non-Invasive Home Ventilator  Initial  $ Face Mask Medium Yes  BiPAP/CPAP/SIPAP Pt Type Adult  BiPAP/CPAP/SIPAP DREAMSTATIOND  Mask Type Full face mask  Mask Size Medium  Respiratory Rate 18 breaths/min  FiO2 (%) 28 %  Flow Rate 2 lpm  Patient Home Equipment No  Auto Titrate Yes (5-20)

## 2023-05-13 NOTE — Evaluation (Signed)
Physical Therapy Evaluation Patient Details Name: Paul [name] MRN: [id number] DOB: Nov 03, 1990 Today's Date: 05/13/2023  History of Present Illness  Pt is a 32 y/o M admitted on 05/12/23 after presenting with c/o progressively worsening cough x 5 days, subjected fevers & chills. Pt is being treated for sepsis 2/2 CAP. PMH: narcolepsy, asthma, fetal alcohol syndrome, ADHD, sleep apnea, Hodgkin's lymphoma, ADD, auditory processing disorder  Clinical Impression  Pt seen for PT evaluation with pt agreeable to tx. Pt reports prior to admission he was independent without AD, living with parents & 56 y/o brother in 1 level home with steps to access different rooms.  On this date, pt ambulates 1 lap around unit without AD without overt LOB. Pt report's he's ambulating at baseline level of function. At this time pt does not require acute PT services. Pt would benefit from ongoing mobilization with nursing staff & mobility specialists. PT to complete orders at this time, please re-consult if new needs arise.      If plan is discharge home, recommend the following: Assistance with cooking/housework;Help with stairs or ramp for entrance;Assist for transportation;Direct supervision/assist for medications management;Direct supervision/assist for financial management   Can travel by private vehicle        Equipment Recommendations None recommended by PT  Recommendations for Other Services       Functional Status Assessment Patient has had a recent decline in their functional status and demonstrates the ability to make significant improvements in function in a reasonable and predictable amount of time.     Precautions / Restrictions Precautions Precautions: None Restrictions Weight Bearing Restrictions: No      Mobility  Bed Mobility Overal bed mobility: Independent             General bed mobility comments: supine>sit    Transfers Overall transfer level: Modified independent                  General transfer comment: STS without AD    Ambulation/Gait Ambulation/Gait assistance: Modified independent (Device/Increase time) Gait Distance (Feet):  (1 lap around unit >150 ft) Assistive device: None Gait Pattern/deviations: Decreased stride length, Decreased dorsiflexion - left, Decreased dorsiflexion - right, Decreased step length - left, Decreased step length - right Gait velocity: slightly decreased     General Gait Details: trunk rotated R, decreased step length RLE compared to LLE  Stairs            Wheelchair Mobility     Tilt Bed    Modified Rankin (Stroke Patients Only)       Balance Overall balance assessment: Needs assistance Sitting-balance support: Feet supported Sitting balance-Leahy Scale: Good     Standing balance support: During functional activity, No upper extremity supported Standing balance-Leahy Scale: Good                 High Level Balance Comments: No overt LOB during gait.             Pertinent Vitals/Pain Pain Assessment Pain Assessment: Faces Faces Pain Scale: Hurts a little bit Pain Location: BLE Pain Descriptors / Indicators: Aching Pain Intervention(s): Monitored during session    Home Living Family/patient expects to be discharged to:: Private residence Living Arrangements: Parent (sibling(s)) Available Help at Discharge: Family;Available 24 hours/day Type of Home: House Home Access: Stairs to enter Entrance Stairs-Rails: None Entrance Stairs-Number of Steps: Pt reports 1 step to enter house, 2 steps to access each bedroom without rails (question accuracy of report)   Home Layout:  One level Home Equipment: Rollator (4 wheels) Additional Comments: Pt's father has a rollator, unsure who uses it/how much    Prior Function               Mobility Comments: Pt reports he's independent without AD but did use rollator 1 time prior to admission, denies falls.       Extremity/Trunk  Assessment   Upper Extremity Assessment Upper Extremity Assessment: Overall WFL for tasks assessed    Lower Extremity Assessment Lower Extremity Assessment: Overall WFL for tasks assessed       Communication   Communication Communication: No apparent difficulties  Cognition Arousal: Alert Behavior During Therapy: WFL for tasks assessed/performed Overall Cognitive Status: History of cognitive impairments - at baseline (pt with hx of fetal alcohol syndrome)                                 General Comments: Pt is AxOx4, follows commands, pleasant, but soiled with urine & appears unaware.        General Comments General comments (skin integrity, edema, etc.): Pt on 2L/min via nasal cannula with SpO2 >90% throughout gait. PT assisted pt with changing into clean gown. Pt briefly grimaced in pain when changing/adjusting gowns reporting pain at PICC insertion but PT observed & did not see any issues.    Exercises     Assessment/Plan    PT Assessment Patient does not need any further PT services  PT Problem List         PT Treatment Interventions      PT Goals (Current goals can be found in the Care Plan section)  Acute Rehab PT Goals Patient Stated Goal: none stated PT Goal Formulation: With patient Time For Goal Achievement: 05/27/23 Potential to Achieve Goals: Good    Frequency       Co-evaluation               AM-PAC PT "6 Clicks" Mobility  Outcome Measure Help needed turning from your back to your side while in a flat bed without using bedrails?: None Help needed moving from lying on your back to sitting on the side of a flat bed without using bedrails?: None Help needed moving to and from a bed to a chair (including a wheelchair)?: None Help needed standing up from a chair using your arms (e.g., wheelchair or bedside chair)?: None Help needed to walk in hospital room?: None Help needed climbing 3-5 steps with a railing? : A Little 6 Click  Score: 23    End of Session Equipment Utilized During Treatment: Oxygen Activity Tolerance: Patient tolerated treatment well Patient left: in chair;with chair alarm set;with call bell/phone within reach        Time: 1220-1240 PT Time Calculation (min) (ACUTE ONLY): 20 min   Charges:   PT Evaluation $PT Eval Low Complexity: 1 Low   PT General Charges $$ ACUTE PT VISIT: 1 Visit         Aleda Grana, PT, DPT 05/13/23, 12:58 PM   Sandi Mariscal 05/13/2023, 12:57 PM

## 2023-05-13 NOTE — Plan of Care (Signed)

## 2023-05-13 NOTE — Progress Notes (Signed)
PROGRESS NOTE    Paul [name]  ZOX:[id number] DOB: [birth year] DOA: 05/12/2023 PCP: Mattie Marlin, DO  Brief Narrative: 32 y.o. male with medical history significant for narcolepsy, asthma, fetal alcohol syndrome, ADHD, sleep apnea, recent diagnosis of Hodgkin's lymphoma, last treatment with nivolumab on 05/04/2023, who presents from home with complaints of progressively worsening productive cough x 5 days, associated with subjective fevers and chills since yesterday.  The patient was seen at [facility] ED earlier this morning where he was given a dose of Rocephin and azithromycin in the ED then he was discharged home on oral antibiotics.  Due to worsening symptoms he presented to [facility] ED for further evaluation.   In the ED, workup revealed pneumonia.  The patient was started on empiric IV antibiotics for CAP.  Febrile with Tmax of 102.3.  Peripheral blood cultures x 2 were obtained.  TRH, hospitalist service, was asked to admit.   ED Course: Tmax 102.3.  BP 111/65, pulse 94, respiratory 18, saturation 95% on room air.  Lab studies notable for WBC 3.8, hemoglobin 12.4.  Serum sodium 132, potassium 3.2, glucose 112.  Assessment & Plan:   Principal Problem:   Pneumonia   #1 Sepsis secondary to community-acquired pneumonia, POA Presented with persistent productive cough, subjective fevers and chills. Leukopenia with WBC 3.8, febrile with Tmax 102.3, tachypnea with respiratory rate 35.  He was started on Rocephin and azithromycin continue the same.  Incentive spirometer.  Out of bed ambulate.  Follow-up sputum culture and blood culture.   Asthma exacerbation secondary to pneumonia-the nebulizer and steroids O2 to keep sats above 92%  Recently diagnosed Hodgkin lymphoma on therapy Last therapy with nivolumab on 05/04/2023 Follows with Dr. Candise Che, medical oncology, outpatient   Chronic anxiety/depression Resume home regimen  Estimated body mass index is 34.44 kg/m as  calculated from the following:   Height as of this encounter: 5\' 10"  (1.778 m).   Weight as of this encounter: 108.9 kg.  DVT prophylaxis: lovenox Code Status:full Family Communication: none Disposition Plan:  Status is: Inpatient Remains inpatient appropriate because: hypoxia   Consultants: none  Procedures: none Antimicrobials: rocephin azithro  Subjective: Resting on oxygen  Objective: Vitals:   05/13/23 0633 05/13/23 0813 05/13/23 1133 05/13/23 1333  BP: 110/69  115/74   Pulse: 60  88   Resp: 18  18   Temp: 98.1 F (36.7 C)  98.2 F (36.8 C)   TempSrc: Oral  Oral   SpO2: 95% 95% 93% 97%  Weight:      Height:        Intake/Output Summary (Last 24 hours) at 05/13/2023 1604 Last data filed at 05/13/2023 1421 Gross per 24 hour  Intake 933.27 ml  Output 1200 ml  Net -266.73 ml   Filed Weights   05/12/23 1943  Weight: 108.9 kg    Examination:  General exam: Appears in nad  Respiratory system: rhonchi to auscultation. Respiratory effort normal. Cardiovascular system: S1 & S2 heard, RRR. No JVD, murmurs, rubs, gallops or clicks. No pedal edema. Gastrointestinal system: Abdomen is nondistended, soft and nontender. No organomegaly or masses felt. Normal bowel sounds heard. Central nervous system: Alert and oriented. No focal neurological deficits. Extremities: no edema  Data Reviewed: I have personally reviewed following labs and imaging studies  CBC: Recent Labs  Lab 05/12/23 2048 05/13/23 0407  WBC 3.8* 3.4*  NEUTROABS 2.6  --   HGB 12.4* 12.4*  HCT 37.4* 37.1*  MCV 86.8 87.5  PLT 156  156   Basic Metabolic Panel: Recent Labs  Lab 05/12/23 2048 05/13/23 0407  NA 132* 136  K 3.2* 4.1  CL 97* 102  CO2 26 25  GLUCOSE 112* 123*  BUN 8 8  CREATININE 1.05 0.93  CALCIUM 8.6* 8.8*  MG  --  2.1  PHOS  --  2.6   GFR: Estimated Creatinine Clearance: 142.3 mL/min (by C-G formula based on SCr of 0.93 mg/dL). Liver Function Tests: Recent Labs  Lab  05/12/23 2048  AST 24  ALT 23  ALKPHOS 74  BILITOT 0.4  PROT 7.5  ALBUMIN 3.5   No results for input(s): "LIPASE", "AMYLASE" in the last 168 hours. No results for input(s): "AMMONIA" in the last 168 hours. Coagulation Profile: Recent Labs  Lab 05/12/23 2048  INR 1.2   Cardiac Enzymes: No results for input(s): "CKTOTAL", "CKMB", "CKMBINDEX", "TROPONINI" in the last 168 hours. BNP (last 3 results) No results for input(s): "PROBNP" in the last 8760 hours. HbA1C: No results for input(s): "HGBA1C" in the last 72 hours. CBG: No results for input(s): "GLUCAP" in the last 168 hours. Lipid Profile: No results for input(s): "CHOL", "HDL", "LDLCALC", "TRIG", "CHOLHDL", "LDLDIRECT" in the last 72 hours. Thyroid Function Tests: No results for input(s): "TSH", "T4TOTAL", "FREET4", "T3FREE", "THYROIDAB" in the last 72 hours. Anemia Panel: No results for input(s): "VITAMINB12", "FOLATE", "FERRITIN", "TIBC", "IRON", "RETICCTPCT" in the last 72 hours. Sepsis Labs: Recent Labs  Lab 05/12/23 2101  LATICACIDVEN 0.9    Recent Results (from the past 240 hour(s))  SARS Coronavirus 2 by RT PCR (hospital order, performed in [facility] hospital lab) *cepheid single result test* Anterior Nasal Swab     Status: None   Collection Time: 05/12/23  7:52 PM   Specimen: Anterior Nasal Swab  Result Value Ref Range Status   SARS Coronavirus 2 by RT PCR NEGATIVE NEGATIVE Final    Comment: (NOTE) SARS-CoV-2 target nucleic acids are NOT DETECTED.  The SARS-CoV-2 RNA is generally detectable in upper and lower respiratory specimens during the acute phase of infection. The lowest concentration of SARS-CoV-2 viral copies this assay can detect is 250 copies / mL. A negative result does not preclude SARS-CoV-2 infection and should not be used as the sole basis for treatment or other patient management decisions.  A negative result may occur with improper specimen collection / handling, submission of specimen  other than nasopharyngeal swab, presence of viral mutation(s) within the areas targeted by this assay, and inadequate number of viral copies (<250 copies / mL). A negative result must be combined with clinical observations, patient history, and epidemiological information.  Fact Sheet for Patients:   RoadLapTop.co.za  Fact Sheet for Healthcare Providers: http://kim-miller.com/  This test is not yet approved or  cleared by the [place] FDA and has been authorized for detection and/or diagnosis of SARS-CoV-2 by FDA under an Emergency Use Authorization (EUA).  This EUA will remain in effect (meaning this test can be used) for the duration of the COVID-19 declaration under Section 564(b)(1) of the Act, 21 U.S.C. section 360bbb-3(b)(1), unless the authorization is terminated or revoked sooner.  Performed at [facility], [address], [place], [place] [address]   Culture, blood (Routine x 2)     Status: None (Preliminary result)   Collection Time: 05/12/23  8:32 PM   Specimen: BLOOD RIGHT ARM  Result Value Ref Range Status   Specimen Description   Final    BLOOD RIGHT ARM Performed at [facility], [address], [place], [place] [address]    Special Requests   Final    BOTTLES DRAWN AEROBIC AND ANAEROBIC Blood Culture adequate volume Performed at [facility], [address], [place], [place] [address]    Culture   Final    NO GROWTH < 12 HOURS Performed at [facility], [address] [place] [address], [place], [place] [address]    Report Status PENDING  Incomplete  Culture, blood (Routine x 2)     Status: None (Preliminary result)   Collection Time: 05/12/23  8:48 PM   Specimen: BLOOD RIGHT ARM  Result Value Ref Range Status   Specimen Description   Final    BLOOD RIGHT ARM Performed at [facility], [address] [place], [place] [address]    Special Requests   Final    BOTTLES  DRAWN AEROBIC AND ANAEROBIC Blood Culture adequate volume Performed at [facility], [address], [place], [place] [address]    Culture   Final    NO GROWTH < 12 HOURS Performed at [facility], [address], [place], [place] [address]    Report Status PENDING  Incomplete  Expectorated Sputum Assessment w Gram Stain, Rflx to Resp Cult     Status: None   Collection Time: 05/13/23 12:28 AM   Specimen: Expectorated Sputum  Result Value Ref Range Status   Specimen Description EXPECTORATED SPUTUM  Final   Special Requests Immunocompromised  Final   Sputum evaluation   Final    THIS SPECIMEN IS ACCEPTABLE FOR SPUTUM CULTURE Performed at [facility], [address], [place], [place] [address]    Report Status 05/13/2023 FINAL  Final  Culture, Respiratory w Gram Stain     Status: None (Preliminary result)   Collection Time: 05/13/23 12:28 AM  Result Value Ref Range Status   Specimen Description   Final    EXPECTORATED SPUTUM Performed at [facility], [address] [place], [place] [address]    Special Requests   Final    Immunocompromised Reflexed from [id number] Performed at [facility], [address] [place], [place] [address]    Gram Stain   Final    MODERATE WBC PRESENT,BOTH PMN AND MONONUCLEAR FEW GRAM POSITIVE COCCI RARE GRAM NEGATIVE RODS Performed at [facility], [address], [place], [place] [address]    Culture PENDING  Incomplete   Report Status PENDING  Incomplete         Radiology Studies: DG Chest 2 View  Result Date: 05/12/2023 CLINICAL DATA:  Suspected sepsis. Non-Hodgkin's lymphoma receiving chemo every 2 weeks. Feeling unwell with weakness and nausea since last night. Fever. EXAM: CHEST - 2 VIEW COMPARISON:  05/12/2023 at 4:44 a.m. FINDINGS: Right chest wall Port-A-Cath with tip at the superior cavoatrial junction. Stable cardiomediastinal silhouette. Fullness  about the superior mediastinum and along the right heart border consistent with known adenopathy. Slightly improved airspace opacities in the right upper lung compared to earlier today. No pleural effusion or pneumothorax. IMPRESSION: Decreased airspace opacities in the right upper lobe since earlier today, consistent with improving atelectasis or pneumonia. Electronically Signed   By: Minerva Fester M.D.   On: 05/12/2023 20:34    Scheduled Meds:  Chlorhexidine Gluconate Cloth  6 each Topical Daily   DULoxetine  20 mg Oral Daily   enoxaparin (LOVENOX) injection  40 mg Subcutaneous Q24H   guaiFENesin  600 mg Oral BID  ipratropium-albuterol  3 mL Nebulization TID   lidocaine-prilocaine   Topical Once   methylPREDNISolone (SOLU-MEDROL) injection  40 mg Intravenous Daily   multivitamin with minerals  1 tablet Oral Q breakfast   sodium chloride flush  10-40 mL Intracatheter Q12H   sodium chloride HYPERTONIC  4 mL Nebulization BID   Continuous Infusions:  0.9 % NaCl with KCl 40 mEq / L 75 mL/hr at 05/13/23 0015   azithromycin 500 mg (05/12/23 2145)   cefTRIAXone (ROCEPHIN)  IV Stopped (05/12/23 2144)     LOS: 1 day   Time spent: 39 min  Alwyn Ren, MD 05/13/2023, 4:04 PM

## 2023-05-13 NOTE — TOC CM/SW Note (Signed)
Transition of Care [facility]) - Inpatient Brief Assessment   Patient Details  Name: KALVYN SECKINGER MRN: [id number] Date of Birth: 16-Nov-1990  Transition of Care [facility]) CM/SW Contact:    Larrie Kass, LCSW Phone Number: 05/13/2023, 2:14 PM   Clinical Narrative: Transition of Care Department [facility]) has reviewed patient and no TOC needs have been identified at this time. We will continue to monitor patient advancement through interdisciplinary progression rounds. If new patient transition needs arise, please place a TOC consult.     Transition of Care Asessment: Insurance and Status: Insurance coverage has been reviewed Patient has primary care physician: Yes Home environment has been reviewed: home with parents Prior level of function:: independent Prior/Current Home Services: No current home services Social Determinants of Health Reivew: SDOH reviewed no interventions necessary Readmission risk has been reviewed: Yes Transition of care needs: no transition of care needs at this time

## 2023-05-14 ENCOUNTER — Inpatient Hospital Stay (HOSPITAL_COMMUNITY): Payer: Medicare HMO

## 2023-05-14 DIAGNOSIS — A419 Sepsis, unspecified organism: Secondary | ICD-10-CM | POA: Diagnosis not present

## 2023-05-14 LAB — CBC
HCT: 36.1 % — ABNORMAL LOW (ref 39.0–52.0)
Hemoglobin: 11.8 g/dL — ABNORMAL LOW (ref 13.0–17.0)
MCH: 28.8 pg (ref 26.0–34.0)
MCHC: 32.7 g/dL (ref 30.0–36.0)
MCV: 88 fL (ref 80.0–100.0)
Platelets: 249 10*3/uL (ref 150–400)
RBC: 4.1 MIL/uL — ABNORMAL LOW (ref 4.22–5.81)
RDW: 14.3 % (ref 11.5–15.5)
WBC: 5 10*3/uL (ref 4.0–10.5)
nRBC: 0 % (ref 0.0–0.2)

## 2023-05-14 LAB — BASIC METABOLIC PANEL
Anion gap: 10 (ref 5–15)
BUN: 12 mg/dL (ref 6–20)
CO2: 27 mmol/L (ref 22–32)
Calcium: 8.4 mg/dL — ABNORMAL LOW (ref 8.9–10.3)
Chloride: 100 mmol/L (ref 98–111)
Creatinine, Ser: 0.72 mg/dL (ref 0.61–1.24)
GFR, Estimated: >60 mL/min (ref >60)
Glucose, Bld: 109 mg/dL — ABNORMAL HIGH (ref 70–99)
Potassium: 3.5 mmol/L (ref 3.5–5.1)
Sodium: 137 mmol/L (ref 135–145)

## 2023-05-14 NOTE — Plan of Care (Signed)
Problem: Education: Goal: Knowledge of General Education information will improve Description Including pain rating scale, medication(s)/side effects and non-pharmacologic comfort measures Outcome: Progressing   Problem: Health Behavior/Discharge Planning: Goal: Ability to manage health-related needs will improve Outcome: Progressing   Problem: Activity: Goal: Risk for activity intolerance will decrease Outcome: Progressing   Problem: Nutrition: Goal: Adequate nutrition will be maintained Outcome: Progressing   Problem: Coping: Goal: Level of anxiety will decrease Outcome: Progressing   Problem: Elimination: Goal: Will not experience complications related to urinary retention Outcome: Progressing   Problem: Pain Managment: Goal: General experience of comfort will improve Outcome: Progressing   Problem: Safety: Goal: Ability to remain free from injury will improve Outcome: Progressing   Problem: Skin Integrity: Goal: Risk for impaired skin integrity will decrease Outcome: Progressing   

## 2023-05-14 NOTE — Progress Notes (Signed)
PROGRESS NOTE    POL HODO  VHQ:[id number] DOB: September 16, 1990 DOA: 05/12/2023 PCP: Mattie Marlin, DO  Brief Narrative: 32 y.o. male with medical history significant for narcolepsy, asthma, fetal alcohol syndrome, ADHD, sleep apnea, recent diagnosis of Hodgkin's lymphoma, last treatment with nivolumab on 05/04/2023, who presents from home with complaints of progressively worsening productive cough x 5 days, associated with subjective fevers and chills since yesterday.  The patient was seen at [facility] ED earlier this morning where he was given a dose of Rocephin and azithromycin in the ED then he was discharged home on oral antibiotics.  Due to worsening symptoms he presented to [facility] ED for further evaluation.   In the ED, workup revealed pneumonia.  The patient was started on empiric IV antibiotics for CAP.  Febrile with Tmax of 102.3.  Peripheral blood cultures x 2 were obtained.  TRH, hospitalist service, was asked to admit.   ED Course: Tmax 102.3.  BP 111/65, pulse 94, respiratory 18, saturation 95% on room air.  Lab studies notable for WBC 3.8, hemoglobin 12.4.  Serum sodium 132, potassium 3.2, glucose 112.  Assessment & Plan:   Principal Problem:   Pneumonia Active Problems:   Sepsis without acute organ dysfunction (HCC)   1 Sepsis secondary to community-acquired pneumonia, POA Presented with persistent productive cough, subjective fevers and chills and leukopenia.  Continue Rocephin and azithromycin.  Out of bed ambulate check ambulatory oxygen saturation.  Cultures remain negative.  Encourage incentive spirometer use.   Asthma exacerbation secondary to pneumonia-continue  nebulizer and steroids Solu-Medrol 40 mg daily. O2 to keep sats above 92%  Recently diagnosed Hodgkin lymphoma on therapy Last therapy with nivolumab on 05/04/2023 Follows with Dr. Candise Che, medical oncology, outpatient   Chronic anxiety/depression-continue Cymbalta   Estimated body mass  index is 34.44 kg/m as calculated from the following:   Height as of this encounter: 5\' 10"  (1.778 m).   Weight as of this encounter: 108.9 kg.  DVT prophylaxis: lovenox Code Status:full Family Communication: none Disposition Plan:  Status is: Inpatient Remains inpatient appropriate because: hypoxia   Consultants: none  Procedures: none Antimicrobials: rocephin azithro  Subjective:  Resting in bed on CPAP complains of productive cough and shortness of breath Objective: Vitals:   05/13/23 1333 05/13/23 2212 05/14/23 0539 05/14/23 0826  BP:  100/64 110/64   Pulse:  81 78   Resp:  18  (!) 22  Temp:  98 F (36.7 C) 97.6 F (36.4 C)   TempSrc:  Oral Axillary   SpO2: 97% 96% 95% 97%  Weight:      Height:        Intake/Output Summary (Last 24 hours) at 05/14/2023 0902 Last data filed at 05/14/2023 0541 Gross per 24 hour  Intake 2734.93 ml  Output 1600 ml  Net 1134.93 ml   Filed Weights   05/12/23 1943  Weight: 108.9 kg    Examination:  General exam: Appears in nad  Respiratory system: Wheezing and rhonchi to auscultation. Respiratory effort normal. Cardiovascular system: S1 & S2 heard, RRR. No JVD, murmurs, rubs, gallops or clicks. No pedal edema. Gastrointestinal system: Abdomen is nondistended, soft and nontender. No organomegaly or masses felt. Normal bowel sounds heard. Central nervous system: Alert and oriented. No focal neurological deficits. Extremities: no edema  Data Reviewed: I have personally reviewed following labs and imaging studies  CBC: Recent Labs  Lab 05/12/23 2048 05/13/23 0407  WBC 3.8* 3.4*  NEUTROABS 2.6  --   HGB  12.4* 12.4*  HCT 37.4* 37.1*  MCV 86.8 87.5  PLT 156 156   Basic Metabolic Panel: Recent Labs  Lab 05/12/23 2048 05/13/23 0407  NA 132* 136  K 3.2* 4.1  CL 97* 102  CO2 26 25  GLUCOSE 112* 123*  BUN 8 8  CREATININE 1.05 0.93  CALCIUM 8.6* 8.8*  MG  --  2.1  PHOS  --  2.6   GFR: Estimated Creatinine Clearance:  142.3 mL/min (by C-G formula based on SCr of 0.93 mg/dL). Liver Function Tests: Recent Labs  Lab 05/12/23 2048  AST 24  ALT 23  ALKPHOS 74  BILITOT 0.4  PROT 7.5  ALBUMIN 3.5   No results for input(s): "LIPASE", "AMYLASE" in the last 168 hours. No results for input(s): "AMMONIA" in the last 168 hours. Coagulation Profile: Recent Labs  Lab 05/12/23 2048  INR 1.2   Cardiac Enzymes: No results for input(s): "CKTOTAL", "CKMB", "CKMBINDEX", "TROPONINI" in the last 168 hours. BNP (last 3 results) No results for input(s): "PROBNP" in the last 8760 hours. HbA1C: No results for input(s): "HGBA1C" in the last 72 hours. CBG: No results for input(s): "GLUCAP" in the last 168 hours. Lipid Profile: No results for input(s): "CHOL", "HDL", "LDLCALC", "TRIG", "CHOLHDL", "LDLDIRECT" in the last 72 hours. Thyroid Function Tests: No results for input(s): "TSH", "T4TOTAL", "FREET4", "T3FREE", "THYROIDAB" in the last 72 hours. Anemia Panel: No results for input(s): "VITAMINB12", "FOLATE", "FERRITIN", "TIBC", "IRON", "RETICCTPCT" in the last 72 hours. Sepsis Labs: Recent Labs  Lab 05/12/23 2101  LATICACIDVEN 0.9    Recent Results (from the past 240 hour(s))  SARS Coronavirus 2 by RT PCR (hospital order, performed in [facility] hospital lab) *cepheid single result test* Anterior Nasal Swab     Status: None   Collection Time: 05/12/23  7:52 PM   Specimen: Anterior Nasal Swab  Result Value Ref Range Status   SARS Coronavirus 2 by RT PCR NEGATIVE NEGATIVE Final    Comment: (NOTE) SARS-CoV-2 target nucleic acids are NOT DETECTED.  The SARS-CoV-2 RNA is generally detectable in upper and lower respiratory specimens during the acute phase of infection. The lowest concentration of SARS-CoV-2 viral copies this assay can detect is 250 copies / mL. A negative result does not preclude SARS-CoV-2 infection and should not be used as the sole basis for treatment or other patient management  decisions.  A negative result may occur with improper specimen collection / handling, submission of specimen other than nasopharyngeal swab, presence of viral mutation(s) within the areas targeted by this assay, and inadequate number of viral copies (<250 copies / mL). A negative result must be combined with clinical observations, patient history, and epidemiological information.  Fact Sheet for Patients:   RoadLapTop.co.za  Fact Sheet for Healthcare Providers: http://kim-miller.com/  This test is not yet approved or  cleared by the [place] FDA and has been authorized for detection and/or diagnosis of SARS-CoV-2 by FDA under an Emergency Use Authorization (EUA).  This EUA will remain in effect (meaning this test can be used) for the duration of the COVID-19 declaration under Section 564(b)(1) of the Act, 21 U.S.C. section 360bbb-3(b)(1), unless the authorization is terminated or revoked sooner.  Performed at [facility], [address], [place], [place] [address]   Culture, blood (Routine x 2)     Status: None (Preliminary result)   Collection Time: 05/12/23  8:32 PM   Specimen: BLOOD RIGHT ARM  Result Value Ref Range Status   Specimen Description  Final    BLOOD RIGHT ARM Performed at [facility], [address] [place], [place] [address]    Special Requests   Final    BOTTLES DRAWN AEROBIC AND ANAEROBIC Blood Culture adequate volume Performed at [facility], [address], [place], [place] [address]    Culture   Final    NO GROWTH < 12 HOURS Performed at [facility], [address], [place], [place] [address]    Report Status PENDING  Incomplete  Culture, blood (Routine x 2)     Status: None (Preliminary result)   Collection Time: 05/12/23  8:48 PM   Specimen: BLOOD RIGHT ARM  Result Value Ref Range Status   Specimen Description   Final    BLOOD RIGHT  ARM Performed at [facility], [address] [place], [place] [address]    Special Requests   Final    BOTTLES DRAWN AEROBIC AND ANAEROBIC Blood Culture adequate volume Performed at [facility], [address], [place], [place] [address]    Culture   Final    NO GROWTH < 12 HOURS Performed at [facility], [address] [place], [place] [address]    Report Status PENDING  Incomplete  Expectorated Sputum Assessment w Gram Stain, Rflx to Resp Cult     Status: None   Collection Time: 05/13/23 12:28 AM   Specimen: Expectorated Sputum  Result Value Ref Range Status   Specimen Description EXPECTORATED SPUTUM  Final   Special Requests Immunocompromised  Final   Sputum evaluation   Final    THIS SPECIMEN IS ACCEPTABLE FOR SPUTUM CULTURE Performed at [facility], [address], [place], [place] [address]    Report Status 05/13/2023 FINAL  Final  Culture, Respiratory w Gram Stain     Status: None (Preliminary result)   Collection Time: 05/13/23 12:28 AM  Result Value Ref Range Status   Specimen Description   Final    EXPECTORATED SPUTUM Performed at [facility], [address], [place], [place] [address]    Special Requests   Final    Immunocompromised Reflexed from [id number] Performed at [facility], [address], [place], [place] [address]    Gram Stain   Final    MODERATE WBC PRESENT,BOTH PMN AND MONONUCLEAR FEW GRAM POSITIVE COCCI RARE GRAM NEGATIVE RODS Performed at [facility], [address] [place], [place] [address]    Culture PENDING  Incomplete   Report Status PENDING  Incomplete         Radiology Studies: DG Chest 2 View  Result Date: 05/12/2023 CLINICAL DATA:  Suspected sepsis. Non-Hodgkin's lymphoma receiving chemo every 2 weeks. Feeling unwell with weakness and nausea since last night. Fever. EXAM: CHEST - 2 VIEW COMPARISON:  05/12/2023 at 4:44 a.m. FINDINGS:  Right chest wall Port-A-Cath with tip at the superior cavoatrial junction. Stable cardiomediastinal silhouette. Fullness about the superior mediastinum and along the right heart border consistent with known adenopathy. Slightly improved airspace opacities in the right upper lung compared to earlier today. No pleural effusion or pneumothorax. IMPRESSION: Decreased airspace opacities in the right upper lobe since earlier today, consistent with improving atelectasis or pneumonia. Electronically Signed   By: Minerva Fester M.D.   On: 05/12/2023 20:34    Scheduled Meds:  Chlorhexidine Gluconate Cloth  6 each Topical Daily   DULoxetine  20 mg Oral Daily   enoxaparin (LOVENOX) injection  40 mg Subcutaneous Q24H   guaiFENesin  600 mg Oral BID   ipratropium-albuterol  3 mL Nebulization TID   lidocaine-prilocaine   Topical Once   methylPREDNISolone (SOLU-MEDROL) injection  40 mg Intravenous Daily   multivitamin with minerals  1 tablet Oral Q breakfast   sodium chloride flush  10-40 mL Intracatheter Q12H   sodium chloride HYPERTONIC  4 mL Nebulization BID   Continuous Infusions:  azithromycin 500 mg (05/13/23 2200)   cefTRIAXone (ROCEPHIN)  IV 2 g (05/13/23 2015)     LOS: 2 days   Time spent: 39 min  Alwyn Ren, MD 05/14/2023, 9:02 AM

## 2023-05-14 NOTE — Progress Notes (Signed)
SATURATION QUALIFICATIONS: (This note is used to comply with regulatory documentation for home oxygen)  Patient Saturations on Room Air at Rest = 96%  Patient Saturations on Room Air while Ambulating = 94%  Patient Saturations on 0 Liters of oxygen while Ambulating = 94%  Patient did not drop below 94% on room air while ambulating.

## 2023-05-14 NOTE — Plan of Care (Signed)

## 2023-05-15 ENCOUNTER — Other Ambulatory Visit: Payer: Self-pay

## 2023-05-15 DIAGNOSIS — A419 Sepsis, unspecified organism: Secondary | ICD-10-CM | POA: Diagnosis not present

## 2023-05-15 LAB — BASIC METABOLIC PANEL
Anion gap: 10 (ref 5–15)
BUN: 13 mg/dL (ref 6–20)
CO2: 27 mmol/L (ref 22–32)
Calcium: 8.8 mg/dL — ABNORMAL LOW (ref 8.9–10.3)
Chloride: 98 mmol/L (ref 98–111)
Creatinine, Ser: 0.68 mg/dL (ref 0.61–1.24)
GFR, Estimated: >60 mL/min (ref >60)
Glucose, Bld: 99 mg/dL (ref 70–99)
Potassium: 3.6 mmol/L (ref 3.5–5.1)
Sodium: 135 mmol/L (ref 135–145)

## 2023-05-15 LAB — CBC
HCT: 37.1 % — ABNORMAL LOW (ref 39.0–52.0)
Hemoglobin: 12.1 g/dL — ABNORMAL LOW (ref 13.0–17.0)
MCH: 28.3 pg (ref 26.0–34.0)
MCHC: 32.6 g/dL (ref 30.0–36.0)
MCV: 86.7 fL (ref 80.0–100.0)
Platelets: 248 10*3/uL (ref 150–400)
RBC: 4.28 MIL/uL (ref 4.22–5.81)
RDW: 14.1 % (ref 11.5–15.5)
WBC: 4.7 10*3/uL (ref 4.0–10.5)
nRBC: 0 % (ref 0.0–0.2)

## 2023-05-15 LAB — CULTURE, RESPIRATORY W GRAM STAIN: Culture: NORMAL

## 2023-05-15 NOTE — Progress Notes (Signed)
Mobility Specialist - Progress Note  Pre-mobility: 94% SpO2 During mobility: 90% SpO2 Post-mobility: 96% SPO2   05/15/23 1254  Mobility  Activity Ambulated independently in hallway  Level of Assistance Contact guard assist, steadying assist  Assistive Device None  Distance Ambulated (ft) 350 ft  Range of Motion/Exercises Active  Activity Response Tolerated fair  Mobility Referral Yes  $Mobility charge 1 Mobility  Mobility Specialist Start Time (ACUTE ONLY) 1245  Mobility Specialist Stop Time (ACUTE ONLY) 1254  Mobility Specialist Time Calculation (min) (ACUTE ONLY) 9 min   Pt was found in on recliner chair and agreeable to ambulate. Pt a little unsteady at first and needed to be reminded to slow down and take deep breaths. At EOS returned to recliner chair with all needs met. Call bell in reach and chair alarm on. RN notified.  Billey Chang Mobility Specialist

## 2023-05-15 NOTE — Plan of Care (Signed)

## 2023-05-15 NOTE — Plan of Care (Signed)

## 2023-05-15 NOTE — Progress Notes (Signed)
PROGRESS NOTE    Paul [name]  XBM:[id number] DOB: 07-07-1991 DOA: 05/12/2023 PCP: Mattie Marlin, DO  Brief Narrative: 32 y.o. male with medical history significant for narcolepsy, asthma, fetal alcohol syndrome, ADHD, sleep apnea, recent diagnosis of Hodgkin's lymphoma, last treatment with nivolumab on 05/04/2023, who presents from home with complaints of progressively worsening productive cough x 5 days, associated with subjective fevers and chills since yesterday.  The patient was seen at [facility] ED earlier this morning where he was given a dose of Rocephin and azithromycin in the ED then he was discharged home on oral antibiotics.  Due to worsening symptoms he presented to [facility] ED for further evaluation.   In the ED, workup revealed pneumonia.  The patient was started on empiric IV antibiotics for CAP.  Febrile with Tmax of 102.3.  Peripheral blood cultures x 2 were obtained.  TRH, hospitalist service, was asked to admit.   ED Course: Tmax 102.3.  BP 111/65, pulse 94, respiratory 18, saturation 95% on room air.  Lab studies notable for WBC 3.8, hemoglobin 12.4.  Serum sodium 132, potassium 3.2, glucose 112.  Assessment & Plan:   Principal Problem:   Pneumonia Active Problems:   Sepsis without acute organ dysfunction (HCC)   1 Sepsis secondary to community-acquired pneumonia, POA Presented with persistent productive cough, subjective fevers and chills and leukopenia.  Continue Rocephin and azithromycin.  Out of bed ambulate check ambulatory oxygen saturation.  Cultures remain negative.  Encourage incentive spirometer use.   Asthma exacerbation secondary to pneumonia-continue  nebulizer and steroids Solu-Medrol 40 mg daily. O2 to keep sats above 92%  Recently diagnosed Hodgkin lymphoma on therapy Last therapy with nivolumab on 05/04/2023 Follows with Dr. Candise Che, medical oncology, outpatient   Chronic anxiety/depression-continue Cymbalta   Estimated body mass  index is 34.44 kg/m as calculated from the following:   Height as of this encounter: 5\' 10"  (1.778 m).   Weight as of this encounter: 108.9 kg.  DVT prophylaxis: lovenox Code Status:full Family Communication: none Disposition Plan:  Status is: Inpatient Remains inpatient appropriate because: hypoxia   Consultants: none  Procedures: none Antimicrobials: rocephin azithro  Subjective:  Still reporting shortness of breath and severe cough asking to stay another night Objective: Vitals:   05/14/23 2048 05/15/23 0117 05/15/23 0531 05/15/23 0855  BP: 119/67  108/71   Pulse: 92 90 94   Resp: 20  (!) 22 13  Temp: 98 F (36.7 C)  97.7 F (36.5 C)   TempSrc:   Oral   SpO2: 97% 96% 96% 93%  Weight:      Height:        Intake/Output Summary (Last 24 hours) at 05/15/2023 1216 Last data filed at 05/15/2023 1000 Gross per 24 hour  Intake 1190 ml  Output 2850 ml  Net -1660 ml   Filed Weights   05/12/23 1943  Weight: 108.9 kg    Examination:  General exam: Appears in nad  Respiratory system: Wheezing and rhonchi to auscultation. Respiratory effort normal. Cardiovascular system: S1 & S2 heard, RRR. No JVD, murmurs, rubs, gallops or clicks. No pedal edema. Gastrointestinal system: Abdomen is nondistended, soft and nontender. No organomegaly or masses felt. Normal bowel sounds heard. Central nervous system: Alert and oriented. No focal neurological deficits. Extremities: no edema  Data Reviewed: I have personally reviewed following labs and imaging studies  CBC: Recent Labs  Lab 05/12/23 2048 05/13/23 0407 05/14/23 1054 05/15/23 0216  WBC 3.8* 3.4* 5.0 4.7  NEUTROABS  2.6  --   --   --   HGB 12.4* 12.4* 11.8* 12.1*  HCT 37.4* 37.1* 36.1* 37.1*  MCV 86.8 87.5 88.0 86.7  PLT 156 156 249 248   Basic Metabolic Panel: Recent Labs  Lab 05/12/23 2048 05/13/23 0407 05/14/23 1054 05/15/23 0216  NA 132* 136 137 135  K 3.2* 4.1 3.5 3.6  CL 97* 102 100 98  CO2 26 25 27 27    GLUCOSE 112* 123* 109* 99  BUN 8 8 12 13   CREATININE 1.05 0.93 0.72 0.68  CALCIUM 8.6* 8.8* 8.4* 8.8*  MG  --  2.1  --   --   PHOS  --  2.6  --   --    GFR: Estimated Creatinine Clearance: 165.4 mL/min (by C-G formula based on SCr of 0.68 mg/dL). Liver Function Tests: Recent Labs  Lab 05/12/23 2048  AST 24  ALT 23  ALKPHOS 74  BILITOT 0.4  PROT 7.5  ALBUMIN 3.5   No results for input(s): "LIPASE", "AMYLASE" in the last 168 hours. No results for input(s): "AMMONIA" in the last 168 hours. Coagulation Profile: Recent Labs  Lab 05/12/23 2048  INR 1.2   Cardiac Enzymes: No results for input(s): "CKTOTAL", "CKMB", "CKMBINDEX", "TROPONINI" in the last 168 hours. BNP (last 3 results) No results for input(s): "PROBNP" in the last 8760 hours. HbA1C: No results for input(s): "HGBA1C" in the last 72 hours. CBG: No results for input(s): "GLUCAP" in the last 168 hours. Lipid Profile: No results for input(s): "CHOL", "HDL", "LDLCALC", "TRIG", "CHOLHDL", "LDLDIRECT" in the last 72 hours. Thyroid Function Tests: No results for input(s): "TSH", "T4TOTAL", "FREET4", "T3FREE", "THYROIDAB" in the last 72 hours. Anemia Panel: No results for input(s): "VITAMINB12", "FOLATE", "FERRITIN", "TIBC", "IRON", "RETICCTPCT" in the last 72 hours. Sepsis Labs: Recent Labs  Lab 05/12/23 2101  LATICACIDVEN 0.9    Recent Results (from the past 240 hour(s))  SARS Coronavirus 2 by RT PCR (hospital order, performed in [facility] hospital lab) *cepheid single result test* Anterior Nasal Swab     Status: None   Collection Time: 05/12/23  7:52 PM   Specimen: Anterior Nasal Swab  Result Value Ref Range Status   SARS Coronavirus 2 by RT PCR NEGATIVE NEGATIVE Final    Comment: (NOTE) SARS-CoV-2 target nucleic acids are NOT DETECTED.  The SARS-CoV-2 RNA is generally detectable in upper and lower respiratory specimens during the acute phase of infection. The lowest concentration of SARS-CoV-2 viral  copies this assay can detect is 250 copies / mL. A negative result does not preclude SARS-CoV-2 infection and should not be used as the sole basis for treatment or other patient management decisions.  A negative result may occur with improper specimen collection / handling, submission of specimen other than nasopharyngeal swab, presence of viral mutation(s) within the areas targeted by this assay, and inadequate number of viral copies (<250 copies / mL). A negative result must be combined with clinical observations, patient history, and epidemiological information.  Fact Sheet for Patients:   RoadLapTop.co.za  Fact Sheet for Healthcare Providers: http://kim-miller.com/  This test is not yet approved or  cleared by the [place] FDA and has been authorized for detection and/or diagnosis of SARS-CoV-2 by FDA under an Emergency Use Authorization (EUA).  This EUA will remain in effect (meaning this test can be used) for the duration of the COVID-19 declaration under Section 564(b)(1) of the Act, 21 U.S.C. section 360bbb-3(b)(1), unless the authorization is terminated or revoked sooner.  Performed  at [facility], [address], [place], [place] [address]   Culture, blood (Routine x 2)     Status: None (Preliminary result)   Collection Time: 05/12/23  8:32 PM   Specimen: BLOOD RIGHT ARM  Result Value Ref Range Status   Specimen Description   Final    BLOOD RIGHT ARM Performed at [facility], [address], [place], [place] [address]    Special Requests   Final    BOTTLES DRAWN AEROBIC AND ANAEROBIC Blood Culture adequate volume Performed at [facility], [address] [place], [place] [address]    Culture   Final    NO GROWTH 3 DAYS Performed at [facility], [address], [place], [place] [address]    Report Status PENDING  Incomplete  Culture, blood (Routine x 2)      Status: None (Preliminary result)   Collection Time: 05/12/23  8:48 PM   Specimen: BLOOD RIGHT ARM  Result Value Ref Range Status   Specimen Description   Final    BLOOD RIGHT ARM Performed at [facility], [address] [place], [place] [address]    Special Requests   Final    BOTTLES DRAWN AEROBIC AND ANAEROBIC Blood Culture adequate volume Performed at [facility], [address] [place], [place] [address]    Culture   Final    NO GROWTH 3 DAYS Performed at [facility], [address] [place], [place] [address]    Report Status PENDING  Incomplete  Expectorated Sputum Assessment w Gram Stain, Rflx to Resp Cult     Status: None   Collection Time: 05/13/23 12:28 AM   Specimen: Expectorated Sputum  Result Value Ref Range Status   Specimen Description EXPECTORATED SPUTUM  Final   Special Requests Immunocompromised  Final   Sputum evaluation   Final    THIS SPECIMEN IS ACCEPTABLE FOR SPUTUM CULTURE Performed at [facility], [address], [place], [place] [address]    Report Status 05/13/2023 FINAL  Final  Culture, Respiratory w Gram Stain     Status: None (Preliminary result)   Collection Time: 05/13/23 12:28 AM  Result Value Ref Range Status   Specimen Description   Final    EXPECTORATED SPUTUM Performed at [facility], [address] [place], [place] [address]    Special Requests   Final    Immunocompromised Reflexed from [id number] Performed at [facility], [address] [place], [place] [address]    Gram Stain   Final    MODERATE WBC PRESENT,BOTH PMN AND MONONUCLEAR FEW GRAM POSITIVE COCCI RARE GRAM NEGATIVE RODS    Culture   Final    CULTURE REINCUBATED FOR BETTER GROWTH Performed at [facility], [address] [place], [place] [address]    Report Status PENDING  Incomplete         Radiology Studies: DG Chest 1 View  Result Date: 05/14/2023 CLINICAL  DATA:  Pneumonia EXAM: CHEST  1 VIEW COMPARISON:  CXR 05/12/23 FINDINGS: Right-sided needle access chest port with unchanged positioning. No pleural effusion. No pneumothorax. No focal airspace opacity. Normal cardiac and mediastinal contours. No radiographically apparent displaced fractures. Visualized upper abdomen unremarkable. IMPRESSION: No focal airspace opacity Electronically Signed   By: Lorenza Cambridge M.D.   On: 05/14/2023 13:45    Scheduled Meds:  Chlorhexidine Gluconate Cloth  6 each Topical Daily   DULoxetine  20 mg Oral Daily   enoxaparin (LOVENOX) injection  40 mg Subcutaneous Q24H   ipratropium-albuterol  3 mL Nebulization TID   lidocaine-prilocaine   Topical Once   multivitamin with minerals  1 tablet Oral Q breakfast   sodium chloride flush  10-40 mL Intracatheter Q12H   Continuous Infusions:  azithromycin 500 mg (05/14/23 2020)   cefTRIAXone (ROCEPHIN)  IV 2 g (05/14/23 1942)     LOS: 3 days   Time spent: 39 min  Alwyn Ren, MD 05/15/2023, 12:16 PM

## 2023-05-15 NOTE — Progress Notes (Signed)
   05/15/23 0117  BiPAP/CPAP/SIPAP  $ Non-Invasive Home Ventilator  Subsequent  BiPAP/CPAP/SIPAP Pt Type Adult  BiPAP/CPAP/SIPAP DREAMSTATIOND  Mask Type Full face mask  Mask Size Medium  Respiratory Rate 18 breaths/min  FiO2 (%) 28 %  Flow Rate 2 lpm  Patient Home Equipment No  Auto Titrate Yes (5-20)  BiPAP/CPAP /SiPAP Vitals  Pulse Rate 90  SpO2 96 %  Bilateral Breath Sounds Diminished  MEWS Score/Color  MEWS Score 0  MEWS Score Color [place] Si

## 2023-05-16 DIAGNOSIS — A419 Sepsis, unspecified organism: Secondary | ICD-10-CM | POA: Diagnosis not present

## 2023-05-16 MED ORDER — GUAIFENESIN 100 MG/5ML PO LIQD: 5.0000 mL | ORAL | 0 refills | Status: DC | PRN

## 2023-05-16 MED ORDER — HEPARIN SOD (PORK) LOCK FLUSH 100 UNIT/ML IV SOLN
500.0000 [IU] | INTRAVENOUS | Status: AC | PRN
  Administered 2023-05-16: 500 [IU]

## 2023-05-16 MED ORDER — AMOXICILLIN-POT CLAVULANATE 875-125 MG PO TABS: 1.0000 | ORAL_TABLET | Freq: Two times a day (BID) | ORAL | 0 refills | Status: DC

## 2023-05-16 NOTE — Progress Notes (Signed)
   05/16/23 0131  BiPAP/CPAP/SIPAP  BiPAP/CPAP/SIPAP Pt Type Adult  BiPAP/CPAP/SIPAP DREAMSTATIOND  Mask Type Full face mask  Mask Size Medium  Respiratory Rate 20 breaths/min  Flow Rate 3 lpm  Patient Home Equipment No  Auto Titrate Yes (auto 20/5)  CPAP/SIPAP surface wiped down Yes  BiPAP/CPAP /SiPAP Vitals  Pulse Rate 100  Resp (!) 26  SpO2 99 %  Bilateral Breath Sounds Diminished  MEWS Score/Color  MEWS Score 2  MEWS Score Color Yellow

## 2023-05-16 NOTE — Discharge Summary (Signed)
Physician Discharge Summary  Paul [name] DGL:[id number] DOB: 1991/05/26 DOA: 05/12/2023  PCP: Mattie Marlin, DO  Admit date: 05/12/2023 Discharge date: 05/16/2023  Admitted From: home Disposition:  home  Recommendations for Outpatient Follow-up:  Follow up with PCP in 1-2 weeks Please obtain BMP/CBC in one week Home Health:none Equipment/Devices none Discharge Condition:stable CODE STATUS:full Diet recommendation: cardiac  Brief/Interim Summary: 32 y.o. male with medical history significant for narcolepsy, asthma, fetal alcohol syndrome, ADHD, sleep apnea, recent diagnosis of Hodgkin's lymphoma, last treatment with nivolumab on 05/04/2023, who presents from home with complaints of progressively worsening productive cough x 5 days, associated with subjective fevers and chills since yesterday.  The patient was seen at [facility] ED earlier this morning where he was given a dose of Rocephin and azithromycin in the ED then he was discharged home on oral antibiotics.  Due to worsening symptoms he presented to [facility] ED for further evaluation.   In the ED, workup revealed pneumonia.  The patient was started on empiric IV antibiotics for CAP.  Febrile with Tmax of 102.3.  Peripheral blood cultures x 2 were obtained.  TRH, hospitalist service, was asked to admit.   ED Course: Tmax 102.3.  BP 111/65, pulse 94, respiratory 18, saturation 95% on room air.  Lab studies notable for WBC 3.8, hemoglobin 12.4.  Serum sodium 132, potassium 3.2, glucose 112.    Discharge Diagnoses:  Principal Problem:   Pneumonia Active Problems:   Sepsis without acute organ dysfunction (HCC)     Sepsis secondary to community-acquired pneumonia, POA Presented with persistent productive cough, subjective fevers and chills and leukopenia. Treated with Rocephin and azithromycin.he was discharged on Augmentin for 5 more days to finish the course.  He did not require oxygen on discharge.  Saturation was  normal on room air.   Asthma exacerbation secondary to pneumonia-he was treated with steroids and nebulizers.     Recently diagnosed Hodgkin lymphoma  Last therapy with nivolumab on 05/04/2023 Follows with Dr. Candise Che, medical oncology, outpatient   Chronic anxiety/depression-continue Cymbalta  Estimated body mass index is 34.44 kg/m as calculated from the following:   Height as of this encounter: 5\' 10"  (1.778 m).   Weight as of this encounter: 108.9 kg.  Discharge Instructions  Discharge Instructions     Diet - low sodium heart healthy   Complete by: As directed    Increase activity slowly   Complete by: As directed       Allergies as of 05/16/2023       Reactions   Other Other (See Comments)   Fetal alcohol syndrome Certain (unnamed) medications have adverse effects on him. Must have heavy anesthesia meds in order to work.        Medication List     STOP taking these medications    dexamethasone 4 MG tablet Commonly known as: DECADRON   mometasone-formoterol 200-5 MCG/ACT Aero Commonly known as: DULERA   olopatadine 0.1 % ophthalmic solution Commonly known as: Pataday   triamcinolone 0.025 % ointment Commonly known as: KENALOG   urea 10 % cream Commonly known as: CARMOL       TAKE these medications    albuterol 108 (90 Base) MCG/ACT inhaler Commonly known as: VENTOLIN HFA Inhale 2 puffs into the lungs every 6 (six) hours as needed for wheezing or shortness of breath.   albuterol (5 MG/ML) 0.5% nebulizer solution Commonly known as: PROVENTIL Take 2.5 mg by nebulization every 6 (six) hours as needed for wheezing  or shortness of breath.   amoxicillin-clavulanate 875-125 MG tablet Commonly known as: AUGMENTIN Take 1 tablet by mouth 2 (two) times daily.   DULoxetine 20 MG capsule Commonly known as: CYMBALTA Take 1 capsule (20 mg total) by mouth daily.   guaiFENesin 100 MG/5ML liquid Commonly known as: ROBITUSSIN Take 5 mLs by mouth every 4 (four)  hours as needed for cough or to loosen phlegm.   ibuprofen 800 MG tablet Commonly known as: ADVIL Take 800 mg by mouth every 8 (eight) hours as needed for mild pain or headache.   lidocaine-prilocaine cream Commonly known as: EMLA Apply to affected area once What changed:  how much to take how to take this when to take this reasons to take this additional instructions   loratadine 10 MG tablet Commonly known as: CLARITIN Take 10 mg by mouth daily as needed for allergies or rhinitis.   multivitamin with minerals Tabs tablet Take 1 tablet by mouth daily with breakfast.   ondansetron 4 MG tablet Commonly known as: ZOFRAN Take 1 tablet (4 mg total) by mouth every 6 (six) hours as needed for nausea.   prochlorperazine 10 MG tablet Commonly known as: COMPAZINE Take 1 tablet (10 mg total) by mouth every 6 (six) hours as needed for nausea or vomiting.   Vitamin D (Ergocalciferol) 1.25 MG (50000 UNIT) Caps capsule Commonly known as: DRISDOL Take 50,000 Units by mouth every Monday.        Allergies  Allergen Reactions   Other Other (See Comments)    Fetal alcohol syndrome  Certain (unnamed) medications have adverse effects on him. Must have heavy anesthesia meds in order to work.    Consultations: None   Procedures/Studies: DG Chest 1 View  Result Date: 05/14/2023 CLINICAL DATA:  Pneumonia EXAM: CHEST  1 VIEW COMPARISON:  CXR 05/12/23 FINDINGS: Right-sided needle access chest port with unchanged positioning. No pleural effusion. No pneumothorax. No focal airspace opacity. Normal cardiac and mediastinal contours. No radiographically apparent displaced fractures. Visualized upper abdomen unremarkable. IMPRESSION: No focal airspace opacity Electronically Signed   By: Lorenza Cambridge M.D.   On: 05/14/2023 13:45   DG Chest 2 View  Result Date: 05/12/2023 CLINICAL DATA:  Suspected sepsis. Non-Hodgkin's lymphoma receiving chemo every 2 weeks. Feeling unwell with weakness and nausea  since last night. Fever. EXAM: CHEST - 2 VIEW COMPARISON:  05/12/2023 at 4:44 a.m. FINDINGS: Right chest wall Port-A-Cath with tip at the superior cavoatrial junction. Stable cardiomediastinal silhouette. Fullness about the superior mediastinum and along the right heart border consistent with known adenopathy. Slightly improved airspace opacities in the right upper lung compared to earlier today. No pleural effusion or pneumothorax. IMPRESSION: Decreased airspace opacities in the right upper lobe since earlier today, consistent with improving atelectasis or pneumonia. Electronically Signed   By: Minerva Fester M.D.   On: 05/12/2023 20:34   (Echo, Carotid, EGD, Colonoscopy, ERCP)    Subjective:  Feeling better no cough no shortness of breath ambulated in the hallway Discharge Exam: Vitals:   05/16/23 0547 05/16/23 0818  BP: 115/85   Pulse: 76   Resp: 20   Temp: 97.7 F (36.5 C)   SpO2: 98% 93%   Vitals:   05/16/23 0131 05/16/23 0400 05/16/23 0547 05/16/23 0818  BP:   115/85   Pulse: 100  76   Resp: (!) 26 (!) 21 20   Temp:   97.7 F (36.5 C)   TempSrc:   Oral   SpO2: 99%  98% 93%  Weight:  Height:        General: Pt is alert, awake, not in acute distress Cardiovascular: RRR, S1/S2 +, no rubs, no gallops Respiratory: CTA bilaterally, no wheezing, no rhonchi Abdominal: Soft, NT, ND, bowel sounds + Extremities: no edema, no cyanosis    The results of significant diagnostics from this hospitalization (including imaging, microbiology, ancillary and laboratory) are listed below for reference.     Microbiology: Recent Results (from the past 240 hour(s))  SARS Coronavirus 2 by RT PCR (hospital order, performed in [facility] hospital lab) *cepheid single result test* Anterior Nasal Swab     Status: None   Collection Time: 05/12/23  7:52 PM   Specimen: Anterior Nasal Swab  Result Value Ref Range Status   SARS Coronavirus 2 by RT PCR NEGATIVE NEGATIVE Final    Comment:  (NOTE) SARS-CoV-2 target nucleic acids are NOT DETECTED.  The SARS-CoV-2 RNA is generally detectable in upper and lower respiratory specimens during the acute phase of infection. The lowest concentration of SARS-CoV-2 viral copies this assay can detect is 250 copies / mL. A negative result does not preclude SARS-CoV-2 infection and should not be used as the sole basis for treatment or other patient management decisions.  A negative result may occur with improper specimen collection / handling, submission of specimen other than nasopharyngeal swab, presence of viral mutation(s) within the areas targeted by this assay, and inadequate number of viral copies (<250 copies / mL). A negative result must be combined with clinical observations, patient history, and epidemiological information.  Fact Sheet for Patients:   RoadLapTop.co.za  Fact Sheet for Healthcare Providers: http://kim-miller.com/  This test is not yet approved or  cleared by the [place] FDA and has been authorized for detection and/or diagnosis of SARS-CoV-2 by FDA under an Emergency Use Authorization (EUA).  This EUA will remain in effect (meaning this test can be used) for the duration of the COVID-19 declaration under Section 564(b)(1) of the Act, 21 U.S.C. section 360bbb-3(b)(1), unless the authorization is terminated or revoked sooner.  Performed at [facility], [address], [place], [place] [address]   Culture, blood (Routine x 2)     Status: None (Preliminary result)   Collection Time: 05/12/23  8:32 PM   Specimen: BLOOD RIGHT ARM  Result Value Ref Range Status   Specimen Description   Final    BLOOD RIGHT ARM Performed at [facility], [address], [place], [place] [address]    Special Requests   Final    BOTTLES DRAWN AEROBIC AND ANAEROBIC Blood Culture adequate volume Performed at [facility], [address], [place], [place] [address]    Culture   Final    NO GROWTH 4 DAYS Performed at [facility], [address] [place], [place] [address]    Report Status PENDING  Incomplete  Culture, blood (Routine x 2)     Status: None (Preliminary result)   Collection Time: 05/12/23  8:48 PM   Specimen: BLOOD RIGHT ARM  Result Value Ref Range Status   Specimen Description   Final    BLOOD RIGHT ARM Performed at [facility], [address], [place], [place] [address]    Special Requests   Final    BOTTLES DRAWN AEROBIC AND ANAEROBIC Blood Culture adequate volume Performed at [facility], [address], [place], [place] [address]    Culture   Final    NO GROWTH 4 DAYS  Performed at [facility], [address], [place], [place] [address]    Report Status PENDING  Incomplete  Expectorated Sputum Assessment w Gram Stain, Rflx to Resp Cult     Status: None   Collection Time: 05/13/23 12:28 AM   Specimen: Expectorated Sputum  Result Value Ref Range Status   Specimen Description EXPECTORATED SPUTUM  Final   Special Requests Immunocompromised  Final   Sputum evaluation   Final    THIS SPECIMEN IS ACCEPTABLE FOR SPUTUM CULTURE Performed at [facility], [address], [place], [place] [address]    Report Status 05/13/2023 FINAL  Final  Culture, Respiratory w Gram Stain     Status: None   Collection Time: 05/13/23 12:28 AM  Result Value Ref Range Status   Specimen Description   Final    EXPECTORATED SPUTUM Performed at [facility], [address], [place], [place] [address]    Special Requests   Final    Immunocompromised Reflexed from [id number] Performed at [facility], [address], [place], [place] [address]    Gram Stain   Final    MODERATE WBC PRESENT,BOTH PMN AND MONONUCLEAR FEW GRAM POSITIVE COCCI RARE GRAM NEGATIVE RODS    Culture   Final    Normal respiratory flora-no Staph  aureus or Pseudomonas seen Performed at [facility], [address], [place], [place] [address]    Report Status 05/15/2023 FINAL  Final     Labs: BNP (last 3 results) No results for input(s): "BNP" in the last 8760 hours. Basic Metabolic Panel: Recent Labs  Lab 05/12/23 2048 05/13/23 0407 05/14/23 1054 05/15/23 0216  NA 132* 136 137 135  K 3.2* 4.1 3.5 3.6  CL 97* 102 100 98  CO2 26 25 27 27   GLUCOSE 112* 123* 109* 99  BUN 8 8 12 13   CREATININE 1.05 0.93 0.72 0.68  CALCIUM 8.6* 8.8* 8.4* 8.8*  MG  --  2.1  --   --   PHOS  --  2.6  --   --    Liver Function Tests: Recent Labs  Lab 05/12/23 2048  AST 24  ALT 23  ALKPHOS 74  BILITOT 0.4  PROT 7.5  ALBUMIN 3.5   No results for input(s): "LIPASE", "AMYLASE" in the last 168 hours. No results for input(s): "AMMONIA" in the last 168 hours. CBC: Recent Labs  Lab 05/12/23 2048 05/13/23 0407 05/14/23 1054 05/15/23 0216  WBC 3.8* 3.4* 5.0 4.7  NEUTROABS 2.6  --   --   --   HGB 12.4* 12.4* 11.8* 12.1*  HCT 37.4* 37.1* 36.1* 37.1*  MCV 86.8 87.5 88.0 86.7  PLT 156 156 249 248   Cardiac Enzymes: No results for input(s): "CKTOTAL", "CKMB", "CKMBINDEX", "TROPONINI" in the last 168 hours. BNP: Invalid input(s): "POCBNP" CBG: No results for input(s): "GLUCAP" in the last 168 hours. D-Dimer No results for input(s): "DDIMER" in the last 72 hours. Hgb A1c No results for input(s): "HGBA1C" in the last 72 hours. Lipid Profile No results for input(s): "CHOL", "HDL", "LDLCALC", "TRIG", "CHOLHDL", "LDLDIRECT" in the last 72 hours. Thyroid function studies No results for input(s): "TSH", "T4TOTAL", "T3FREE", "THYROIDAB" in the last 72 hours.  Invalid input(s): "FREET3" Anemia work up No results for input(s): "VITAMINB12", "FOLATE", "FERRITIN", "TIBC", "IRON", "RETICCTPCT" in the last 72 hours. Urinalysis    Component Value Date/Time   COLORURINE YELLOW 05/13/2023 0604   APPEARANCEUR CLEAR 05/13/2023 0604    LABSPEC  1.014 05/13/2023 0604   PHURINE 6.0 05/13/2023 0604   GLUCOSEU NEGATIVE 05/13/2023 0604   HGBUR SMALL (A) 05/13/2023 0604   BILIRUBINUR NEGATIVE 05/13/2023 0604   KETONESUR 5 (A) 05/13/2023 0604   PROTEINUR 100 (A) 05/13/2023 0604   NITRITE NEGATIVE 05/13/2023 0604   LEUKOCYTESUR NEGATIVE 05/13/2023 0604   Sepsis Labs Recent Labs  Lab 05/12/23 2048 05/13/23 0407 05/14/23 1054 05/15/23 0216  WBC 3.8* 3.4* 5.0 4.7   Microbiology Recent Results (from the past 240 hour(s))  SARS Coronavirus 2 by RT PCR (hospital order, performed in [facility] hospital lab) *cepheid single result test* Anterior Nasal Swab     Status: None   Collection Time: 05/12/23  7:52 PM   Specimen: Anterior Nasal Swab  Result Value Ref Range Status   SARS Coronavirus 2 by RT PCR NEGATIVE NEGATIVE Final    Comment: (NOTE) SARS-CoV-2 target nucleic acids are NOT DETECTED.  The SARS-CoV-2 RNA is generally detectable in upper and lower respiratory specimens during the acute phase of infection. The lowest concentration of SARS-CoV-2 viral copies this assay can detect is 250 copies / mL. A negative result does not preclude SARS-CoV-2 infection and should not be used as the sole basis for treatment or other patient management decisions.  A negative result may occur with improper specimen collection / handling, submission of specimen other than nasopharyngeal swab, presence of viral mutation(s) within the areas targeted by this assay, and inadequate number of viral copies (<250 copies / mL). A negative result must be combined with clinical observations, patient history, and epidemiological information.  Fact Sheet for Patients:   RoadLapTop.co.za  Fact Sheet for Healthcare Providers: http://kim-miller.com/  This test is not yet approved or  cleared by the [place] FDA and has been authorized for detection and/or diagnosis of SARS-CoV-2 by FDA under an  Emergency Use Authorization (EUA).  This EUA will remain in effect (meaning this test can be used) for the duration of the COVID-19 declaration under Section 564(b)(1) of the Act, 21 U.S.C. section 360bbb-3(b)(1), unless the authorization is terminated or revoked sooner.  Performed at [facility], [address] [place], [place] [address]   Culture, blood (Routine x 2)     Status: None (Preliminary result)   Collection Time: 05/12/23  8:32 PM   Specimen: BLOOD RIGHT ARM  Result Value Ref Range Status   Specimen Description   Final    BLOOD RIGHT ARM Performed at [facility], [address] [place], [place] [address]    Special Requests   Final    BOTTLES DRAWN AEROBIC AND ANAEROBIC Blood Culture adequate volume Performed at [facility], [address], [place], [place] [address]    Culture   Final    NO GROWTH 4 DAYS Performed at [facility], [address], [place], [place] [address]    Report Status PENDING  Incomplete  Culture, blood (Routine x 2)     Status: None (Preliminary result)   Collection Time: 05/12/23  8:48 PM   Specimen: BLOOD RIGHT ARM  Result Value Ref Range Status   Specimen Description   Final    BLOOD RIGHT ARM Performed at [facility], [address] [place], [place] [address]    Special Requests   Final    BOTTLES DRAWN AEROBIC AND ANAEROBIC Blood Culture adequate volume Performed at [facility], [address] [place], [place] [address]    Culture   Final  NO GROWTH 4 DAYS Performed at [facility], [address], [place], [place] [address]    Report Status PENDING  Incomplete  Expectorated Sputum Assessment w Gram Stain, Rflx to Resp Cult     Status: None   Collection Time: 05/13/23 12:28 AM   Specimen: Expectorated Sputum  Result Value Ref Range Status   Specimen Description EXPECTORATED SPUTUM  Final   Special Requests Immunocompromised  Final    Sputum evaluation   Final    THIS SPECIMEN IS ACCEPTABLE FOR SPUTUM CULTURE Performed at [facility], [address], [place], [place] [address]    Report Status 05/13/2023 FINAL  Final  Culture, Respiratory w Gram Stain     Status: None   Collection Time: 05/13/23 12:28 AM  Result Value Ref Range Status   Specimen Description   Final    EXPECTORATED SPUTUM Performed at [facility], [address] [place], [place] [address]    Special Requests   Final    Immunocompromised Reflexed from [id number] Performed at [facility], [address] [place], [place] [address]    Gram Stain   Final    MODERATE WBC PRESENT,BOTH PMN AND MONONUCLEAR FEW GRAM POSITIVE COCCI RARE GRAM NEGATIVE RODS    Culture   Final    Normal respiratory flora-no Staph aureus or Pseudomonas seen Performed at [facility], [address], [place], [place] [address]    Report Status 05/15/2023 FINAL  Final     Time coordinating discharge: 39 minutes  SIGNED: Alwyn Ren, MD  [facility] 05/16/2023, 5:02 PM

## 2023-05-17 LAB — CULTURE, BLOOD (ROUTINE X 2)
Culture: NO GROWTH
Culture: NO GROWTH
Special Requests: ADEQUATE
Special Requests: ADEQUATE

## 2023-05-19 ENCOUNTER — Inpatient Hospital Stay: Payer: Medicare HMO

## 2023-05-19 ENCOUNTER — Inpatient Hospital Stay: Payer: Medicare HMO | Admitting: Hematology

## 2023-05-25 ENCOUNTER — Other Ambulatory Visit: Payer: Self-pay

## 2023-05-31 ENCOUNTER — Other Ambulatory Visit: Payer: Self-pay

## 2023-06-01 ENCOUNTER — Inpatient Hospital Stay: Payer: Medicare HMO

## 2023-06-01 ENCOUNTER — Inpatient Hospital Stay (HOSPITAL_BASED_OUTPATIENT_CLINIC_OR_DEPARTMENT_OTHER): Payer: Medicare HMO | Admitting: Hematology

## 2023-06-01 ENCOUNTER — Inpatient Hospital Stay: Payer: Medicare HMO | Attending: Hematology

## 2023-06-01 VITALS — BP 101/72 | HR 72 | Temp 97.9°F | Resp 20 | Wt 246.4 lb

## 2023-06-01 VITALS — BP 110/65 | HR 74

## 2023-06-01 DIAGNOSIS — Z5111 Encounter for antineoplastic chemotherapy: Secondary | ICD-10-CM | POA: Diagnosis not present

## 2023-06-01 DIAGNOSIS — C8102 Nodular lymphocyte predominant Hodgkin lymphoma, intrathoracic lymph nodes: Secondary | ICD-10-CM

## 2023-06-01 DIAGNOSIS — C8179 Other classical Hodgkin lymphoma, extranodal and solid organ sites: Secondary | ICD-10-CM | POA: Diagnosis present

## 2023-06-01 DIAGNOSIS — Z5112 Encounter for antineoplastic immunotherapy: Secondary | ICD-10-CM | POA: Diagnosis present

## 2023-06-01 DIAGNOSIS — Z95828 Presence of other vascular implants and grafts: Secondary | ICD-10-CM

## 2023-06-01 DIAGNOSIS — C8192 Hodgkin lymphoma, unspecified, intrathoracic lymph nodes: Secondary | ICD-10-CM

## 2023-06-01 LAB — CBC WITH DIFFERENTIAL (CANCER CENTER ONLY)
Abs Immature Granulocytes: 0 10*3/uL (ref 0.00–0.07)
Basophils Absolute: 0 10*3/uL (ref 0.0–0.1)
Basophils Relative: 1 %
Eosinophils Absolute: 0.3 10*3/uL (ref 0.0–0.5)
Eosinophils Relative: 7 %
HCT: 38.9 % — ABNORMAL LOW (ref 39.0–52.0)
Hemoglobin: 13 g/dL (ref 13.0–17.0)
Immature Granulocytes: 0 %
Lymphocytes Relative: 41 %
Lymphs Abs: 1.7 10*3/uL (ref 0.7–4.0)
MCH: 28.6 pg (ref 26.0–34.0)
MCHC: 33.4 g/dL (ref 30.0–36.0)
MCV: 85.7 fL (ref 80.0–100.0)
Monocytes Absolute: 0.5 10*3/uL (ref 0.1–1.0)
Monocytes Relative: 13 %
Neutro Abs: 1.6 10*3/uL — ABNORMAL LOW (ref 1.7–7.7)
Neutrophils Relative %: 38 %
Platelet Count: 220 10*3/uL (ref 150–400)
RBC: 4.54 MIL/uL (ref 4.22–5.81)
RDW: 13.7 % (ref 11.5–15.5)
WBC Count: 4 10*3/uL (ref 4.0–10.5)
nRBC: 0 % (ref 0.0–0.2)

## 2023-06-01 LAB — CMP (CANCER CENTER ONLY)
ALT: 27 U/L (ref 0–44)
AST: 22 U/L (ref 15–41)
Albumin: 4 g/dL (ref 3.5–5.0)
Alkaline Phosphatase: 116 U/L (ref 38–126)
Anion gap: 5 (ref 5–15)
BUN: 16 mg/dL (ref 6–20)
CO2: 27 mmol/L (ref 22–32)
Calcium: 9.1 mg/dL (ref 8.9–10.3)
Chloride: 104 mmol/L (ref 98–111)
Creatinine: 0.8 mg/dL (ref 0.61–1.24)
GFR, Estimated: >60 mL/min (ref >60)
Glucose, Bld: 97 mg/dL (ref 70–99)
Potassium: 3.8 mmol/L (ref 3.5–5.1)
Sodium: 136 mmol/L (ref 135–145)
Total Bilirubin: 0.3 mg/dL (ref 0.3–1.2)
Total Protein: 7 g/dL (ref 6.5–8.1)

## 2023-06-01 MED ORDER — SODIUM CHLORIDE 0.9% FLUSH
10.0000 mL | Freq: Once | INTRAVENOUS | Status: AC
  Administered 2023-06-01: 10 mL

## 2023-06-01 MED ORDER — SODIUM CHLORIDE 0.9 % IV SOLN
480.0000 mg | Freq: Once | INTRAVENOUS | Status: AC
  Administered 2023-06-01: 480 mg via INTRAVENOUS
  Filled 2023-06-01: qty 48

## 2023-06-01 MED ORDER — CETIRIZINE HCL 10 MG PO TABS
10.0000 mg | ORAL_TABLET | Freq: Every day | ORAL | Status: DC
  Administered 2023-06-01: 10 mg via ORAL
  Filled 2023-06-01: qty 1

## 2023-06-01 MED ORDER — ACETAMINOPHEN 325 MG PO TABS
650.0000 mg | ORAL_TABLET | Freq: Once | ORAL | Status: AC
  Administered 2023-06-01: 650 mg via ORAL
  Filled 2023-06-01: qty 2

## 2023-06-01 MED ORDER — SODIUM CHLORIDE 0.9% FLUSH
10.0000 mL | INTRAVENOUS | Status: DC | PRN
  Administered 2023-06-01: 10 mL

## 2023-06-01 MED ORDER — SODIUM CHLORIDE 0.9 % IV SOLN: Freq: Once | INTRAVENOUS | Status: AC

## 2023-06-01 MED ORDER — HEPARIN SOD (PORK) LOCK FLUSH 100 UNIT/ML IV SOLN
500.0000 [IU] | Freq: Once | INTRAVENOUS | Status: AC | PRN
  Administered 2023-06-01: 500 [IU]

## 2023-06-01 NOTE — Progress Notes (Signed)
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 male who has been referred to [place] by .Lazoff, Shawn P, DO and Dr. Clydell Hakim MD for evaluation and management of newly diagnosed mediastinal mass concerning for likely lymphoma.  Patient has a history of fetal alcohol syndrome with developmental delay, narcolepsy, ADD, exercise-induced asthma and lives with his adoptive parents. He recently presented to the hospital on 07/01/2022 with 2-week history of worsening shortness of breath cough and wheezing.  He received his asthma treatment but was still noted to have significant cough.  In the emergency room he had a chest x-ray which showed an mediastinal mass. Subsequent CT chest with contrast on 07/01/2022 showed extensive mediastinal and hilar lymphadenopathy with conglomerate soft tissue mass/adenopathy within the anterior mediastinum measuring 11.2 x 7.9 x 13 cm. Compression of the left brachiocephalic and SVC within the mid though these vascular structures remain patent.  Patient did not have any clinical signs or symptoms of SVC compression syndrome or left upper extremity swelling.  Patient subsequently had a CT of the abdomen and pelvis which showed no acute intra-abdominal or intrapelvic abnormalities. He underwent a CT-guided core needle biopsy of the mediastinal mass by interventional radiology.  The official pathology results from his biopsy are not currently available at the time of this clinic visit. I did call and talk to the pathologist Dr. [place] Callas and she notes that this either looks like a lymphoma or thymoma and she is running additional tests to make a final  diagnosis.  Patient's father accompanied him for this visit and his mother who helps make most of the decisions in tandem with his father was present on the phone.  They do not note any unexplained fevers chills night sweats or significant weight loss.  His breathing has been relatively stable since hospital discharge but he is still having some cough.  I confirmed adequacy of tissue sampling with the pathologist and the patient was then started on prednisone to try to help his cough by drinking his mediastinal tumor some and reducing bronchial inflammation.  Patient is unable to provide much information or review of systems on account of his developmental delay issues.  Interval History:   Paul [name] is a wonderful 32 y.o. male who is here for continued evaluation and management of classical hogkins lymphoma. He is here to receive cycle 4 day 1 of his treatment, only Nivolumab not ADCETRIS.  Patient was last seen by me on 05/04/2023 and he complained of bilateral leg neuropathy, bilateral burning feet pain, and sleep apnea.   Patient was admitted to the hospital from 05/12/2023 to 05/16/2023 with complaints of worsening productive cough for 5 days with fevers and chills. He was given a dose of Rocephin and Azithromycin in the morning of 05/12/2023 at [facility] ED and was discharged. He was admitted back to [facility] for further evaluation. At the [facility] ED, workup revealed Pneumonia. He was given empiric IV antibiotics for CAP. He was discharged with 5-day course of Augmentin on 05/16/2023.   Patient is accompanied by his father during this visit. He notes he has been doing well overall since he got discharged from the hospital. He notes his breathing has  improved and denies shortness of breath. Patient still has mild cough.   Patient complains of bilateral leg neuropathy, mild bilateral hand neuropathy, and hand stiffness. He has been taking Cymbalta 20 mg, which has  been helping his neuropathy and his sleeping has improved. Patient's father notes he has an upcoming appointment with his Neurologist for his neuropathy.   Patient has been to his PCP for sleep apnea. During this visit, patient is drowsy. Father notes that his drowsiness has improved after changing Gabapentin to Cymbalta.   He denies any fever, chills, night sweats, shortness of breath, chest pain, abdominal pain, back pain, or leg swelling.   MEDICAL HISTORY:  Past Medical History:  Diagnosis Date   ADD (attention deficit disorder)    ADHD (attention deficit hyperactivity disorder)    Asthma    excercise induced and pollen   Auditory processing disorder    Complication of anesthesia    mother states pt. is harder to put under anes. and hard to wake up due to fetal alcohol syndrome; can be combative, per mother; states he will do better if mother is in PACU when he is coming out of anes.   Development delay    states mental status of a 32 year old   Exercise-induced asthma    prn inhaler/neb.   Fetal alcohol syndrome    Narcolepsy    Non-restorable tooth 09/2015   teeth   Separation anxiety    Twin birth     SURGICAL HISTORY: Past Surgical History:  Procedure Laterality Date   IR IMAGING GUIDED PORT INSERTION  08/11/2022   MULTIPLE EXTRACTIONS WITH ALVEOLOPLASTY N/A 09/23/2015   Procedure: MULTIPLE EXTRACTION WITH ALVEOLOPLASTY;  Surgeon: Ocie Doyne, DDS;  Location: [facility];  Service: Oral Surgery;  Laterality: N/A;   PYLOROMYOTOMY     age [age], performed found to not have stenosis on surgical exam   PYLOROMYOTOMY     did not have pyloric stenosis; did surgery on the wrong twin   RADIOLOGY WITH ANESTHESIA N/A 01/16/2020   Procedure: MRI WITH ANESTHESIA  BRAIN WITH AND WITHOUT CONTRAST;  Surgeon: Radiologist, Medication, MD;  Location: [facility] OR;  Service: Radiology;  Laterality: N/A;    SOCIAL HISTORY: Social History   Socioeconomic History   Marital status:  Single    Spouse name: Not on file   Number of children: Not on file   Years of education: Not on file   Highest education level: Not on file  Occupational History   Not on file  Tobacco Use   Smoking status: Never   Smokeless tobacco: Never  Vaping Use   Vaping status: Never Used  Substance and Sexual Activity   Alcohol use: No   Drug use: No   Sexual activity: Not on file  Other Topics Concern   Not on file  Social History Narrative   ** Merged History Encounter **       Social Determinants of Health   Financial Resource Strain: Low Risk  (04/09/2021)   Received from [facility] visits prior to 11/07/2022., [facility] visits prior to 11/07/2022.   Overall Financial Resource Strain (CARDIA)    Difficulty of Paying Living Expenses: Not hard at all  Food Insecurity: No Food Insecurity (05/13/2023)   Hunger Vital Sign    Worried About Running Out of Food in the Last Year: Never true    Ran Out of Food in the Last Year: Never true  Transportation Needs: No Transportation  Needs (05/13/2023)   PRAPARE - Administrator, Civil Service (Medical): No    Lack of Transportation (Non-Medical): No  Physical Activity: Insufficiently Active (04/09/2021)   Received from [facility] visits prior to 11/07/2022., [facility] visits prior to 11/07/2022.   Exercise Vital Sign    Days of Exercise per Week: 5 days    Minutes of Exercise per Session: 10 min  Stress: No Stress Concern Present (04/09/2021)   Received from [facility] visits prior to 11/07/2022., [facility] visits prior to 11/07/2022.   [employer] of Occupational Health - Occupational Stress Questionnaire    Feeling of Stress : Only a little  Social Connections: Socially Isolated (04/09/2021)   Received from [facility] visits prior to 11/07/2022., [facility]  visits prior to 11/07/2022.   Social Advertising account executive [NHANES]    Frequency of Communication with Friends and Family: Never    Frequency of Social Gatherings with Friends and Family: Once a week    Attends Religious Services: Never    Database administrator or Organizations: Yes    Attends Engineer, structural: More than 4 times per year    Marital Status: Never married  Intimate Partner Violence: Not At Risk (05/13/2023)   Humiliation, Afraid, Rape, and Kick questionnaire    Fear of Current or Ex-Partner: No    Emotionally Abused: No    Physically Abused: No    Sexually Abused: No    FAMILY HISTORY: Family History  Adopted: Yes  Problem Relation Age of Onset   Mental illness Mother    Diabetes Mother    Hypertension Mother    Schizophrenia Mother    Bipolar disorder Mother    Mental retardation Father    Sleep apnea Neg Hx     ALLERGIES:  is allergic to other.  MEDICATIONS:  Current Outpatient Medications  Medication Sig Dispense Refill   albuterol (PROVENTIL HFA;VENTOLIN HFA) 108 (90 BASE) MCG/ACT inhaler Inhale 2 puffs into the lungs every 6 (six) hours as needed for wheezing or shortness of breath.     albuterol (PROVENTIL) (5 MG/ML) 0.5% nebulizer solution Take 2.5 mg by nebulization every 6 (six) hours as needed for wheezing or shortness of breath.     amoxicillin-clavulanate (AUGMENTIN) 875-125 MG tablet Take 1 tablet by mouth 2 (two) times daily. 10 tablet 0   DULoxetine (CYMBALTA) 20 MG capsule Take 1 capsule (20 mg total) by mouth daily. 30 capsule 1   guaiFENesin (ROBITUSSIN) 100 MG/5ML liquid Take 5 mLs by mouth every 4 (four) hours as needed for cough or to loosen phlegm. 120 mL 0   ibuprofen (ADVIL) 800 MG tablet Take 800 mg by mouth every 8 (eight) hours as needed for mild pain or headache.     lidocaine-prilocaine (EMLA) cream Apply to affected area once (Patient taking differently: Apply 1 Application topically as needed (for port access).)  30 g 3   loratadine (CLARITIN) 10 MG tablet Take 10 mg by mouth daily as needed for allergies or rhinitis.     Multiple Vitamin (MULTIVITAMIN WITH MINERALS) TABS tablet Take 1 tablet by mouth daily with breakfast.     ondansetron (ZOFRAN) 4 MG tablet Take 1 tablet (4 mg total) by mouth every 6 (six) hours as needed for nausea. 20 tablet 0   prochlorperazine (COMPAZINE) 10 MG tablet Take 1 tablet (10 mg total) by  mouth every 6 (six) hours as needed for nausea or vomiting. 30 tablet 1   Vitamin D, Ergocalciferol, (DRISDOL) 1.25 MG (50000 UNIT) CAPS capsule Take 50,000 Units by mouth every Monday.     No current facility-administered medications for this visit.    REVIEW OF SYSTEMS:    10 Point review of Systems was done is negative except as noted above.   PHYSICAL EXAMINATION  BP 101/72   Pulse 72   Temp 97.9 F (36.6 C)   Resp 20   Wt 246 lb 6.4 oz (111.8 kg)   SpO2 100%   BMI 35.35 kg/m  GENERAL:alert, in no acute distress and comfortable SKIN: no acute rashes, no significant lesions EYES: conjunctiva are pink and non-injected, sclera anicteric OROPHARYNX: MMM, no exudates, no oropharyngeal erythema or ulceration NECK: supple, no JVD LYMPH:  no palpable lymphadenopathy in the cervical, axillary or inguinal regions LUNGS: clear to auscultation b/l with normal respiratory effort HEART: regular rate & rhythm ABDOMEN:  normoactive bowel sounds , non tender, not distended. No splenomegaly. Extremity: no pedal edema PSYCH: alert & oriented x 3 with fluent speech NEURO: no focal motor/sensory deficits   LABORATORY DATA:  I have reviewed the data as listed  .    Latest Ref Rng & Units 06/01/2023   12:37 PM 05/15/2023    2:16 AM 05/14/2023   10:54 AM  CBC  WBC 4.0 - 10.5 K/uL 4.0  4.7  5.0   Hemoglobin 13.0 - 17.0 g/dL 16.1  09.6  04.5   Hematocrit 39.0 - 52.0 % 38.9  37.1  36.1   Platelets 150 - 400 K/uL 220  248  249    ANC 300  .    Latest Ref Rng & Units 06/01/2023    12:37 PM 05/15/2023    2:16 AM 05/14/2023   10:54 AM  CMP  Glucose 70 - 99 mg/dL 97  99  409   BUN 6 - 20 mg/dL 16  13  12    Creatinine 0.61 - 1.24 mg/dL 8.11  9.14  7.82   Sodium 135 - 145 mmol/L 136  135  137   Potassium 3.5 - 5.1 mmol/L 3.8  3.6  3.5   Chloride 98 - 111 mmol/L 104  98  100   CO2 22 - 32 mmol/L 27  27  27    Calcium 8.9 - 10.3 mg/dL 9.1  8.8  8.4   Total Protein 6.5 - 8.1 g/dL 7.0     Total Bilirubin 0.3 - 1.2 mg/dL 0.3     Alkaline Phos 38 - 126 U/L 116     AST 15 - 41 U/L 22     ALT 0 - 44 U/L 27      . Lab Results  Component Value Date   LDH 171 07/03/2022   RADIOGRAPHIC STUDIES: I have personally reviewed the radiological images as listed and agreed with the findings in the report. DG Chest 1 View  Result Date: 05/14/2023 CLINICAL DATA:  Pneumonia EXAM: CHEST  1 VIEW COMPARISON:  CXR 05/12/23 FINDINGS: Right-sided needle access chest port with unchanged positioning. No pleural effusion. No pneumothorax. No focal airspace opacity. Normal cardiac and mediastinal contours. No radiographically apparent displaced fractures. Visualized upper abdomen unremarkable. IMPRESSION: No focal airspace opacity Electronically Signed   By: Lorenza Cambridge M.D.   On: 05/14/2023 13:45   DG Chest 2 View  Result Date: 05/12/2023 CLINICAL DATA:  Suspected sepsis. Non-Hodgkin's lymphoma receiving chemo every 2 weeks. Feeling unwell with  weakness and nausea since last night. Fever. EXAM: CHEST - 2 VIEW COMPARISON:  05/12/2023 at 4:44 a.m. FINDINGS: Right chest wall Port-A-Cath with tip at the superior cavoatrial junction. Stable cardiomediastinal silhouette. Fullness about the superior mediastinum and along the right heart border consistent with known adenopathy. Slightly improved airspace opacities in the right upper lung compared to earlier today. No pleural effusion or pneumothorax. IMPRESSION: Decreased airspace opacities in the right upper lobe since earlier today, consistent with improving  atelectasis or pneumonia. Electronically Signed   By: Minerva Fester M.D.   On: 05/12/2023 20:34    Surgical Pathology result from 07/03/2022: FINAL MICROSCOPIC DIAGNOSIS:  A. MEDIASTINAL MASS, ANTERIOR, NEEDLE CORE BIOPSY: -Atypical lymphoid proliferation consistent with classical Hodgkin lymphoma -See comment  COMMENT:  The sections show needle core biopsy fragments displaying a nodular and diffuse lymphoid proliferation associated with dense sclerosis.  The lymphoid process shows predominance of small lymphoid cells admixed to a lesser extent with histiocytes, eosinophils in addition to the large atypical mononuclear and occasionally lobated lymphoid appearing cells with variably prominent nucleoli.  Flow cytometric analysis was performed [facility] [phone]) and shows predominance of CD4-positive T cells. No monoclonal B-cell population identified.  In addition, a battery of immunohistochemical stains was performed including CD30, CD15, mum 1, LCA, CD20, PAX5, CD3, CD5, CD4, CD8, TdT, cytokeratin AE1/AE3, cytokeratin 8/18 and EBV in situ hybridization with appropriate controls.  The large atypical lymphoid appearing cells are positive for CD15, CD30, PAX5, mum 1 and rare cells for CD20.  No significant staining is seen with LCA, EBV, CD3, CD5, CD4, CD8.  Cytokeratin stains highlight a small focus of positivity likely representing native epithelial elements.  No significant TdT positivity is identified.  The small lymphocytes in the background show a mixture of T and B cells with predominance of T cells.  The latter show predominance of CD4 positive cells.  Overall, the features are atypical and most consistent with classical Hodgkin lymphoma which is best subclassified as nodular sclerosis type.   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 The mass was causing some compression of his SVC and left brachiocephalic vein but no complete obstruction or symptoms related to this. No constitutional symptoms.  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  PLAN: -Discussed lab results from today, 06/01/2023, in detail with the patient. CBC and CMP are stable.  -Patient only received Nivolumab treatment without ADCETRIS. He tolerated cycle 3 well without any new or severe toxicities.  -Patient can continue cycle 4 day 1 of his treatment, only Nivolumab and not ADCETRIS, during this visit.  -We will change patient's treatment plan to only Nivolumab treatment for 4 weeks. Oral Zyrtec instead of Benadryl and no steroids.  -Continue B-complex supplement.  -Repeat scans after cycle 5 of his treatment.    FOLLOW-UP: -Continue every 4 weekly Nivolumab per integrated scheduling with portflush, labs and MD visit  The total time spent in the appointment was 30 minutes* .  All of the patient's questions were answered with apparent satisfaction. The patient knows to call the clinic with any problems, questions or concerns.   Wyvonnia Lora MD MS AAHIVMS [facility] Hematology/Oncology Physician [facility]  .*Total Encounter Time as  defined by the Centers for Medicare and Medicaid Services includes, in addition to the face-to-face time of a patient visit (documented in the note above) non-face-to-face time: obtaining and reviewing outside history, ordering and reviewing medications, tests or procedures, care coordination (communications with other health care professionals or caregivers) and documentation in the medical record.   I,Param Shah,acting as a Neurosurgeon for Wyvonnia Lora, MD.,have documented all relevant documentation on the behalf of Wyvonnia Lora, MD,as directed by   Wyvonnia Lora, MD while in the presence of Wyvonnia Lora, MD.  .I have reviewed the above documentation for accuracy and completeness, and I agree with the above. Johney Maine MD

## 2023-06-01 NOTE — Patient Instructions (Signed)
[place] [facility]  Discharge Instructions: Thank you for choosing [facility] to provide your oncology and hematology care.   If you have a lab appointment with the Cancer Center, please go directly to the Cancer Center and check in at the registration area.   Wear comfortable clothing and clothing appropriate for easy access to any Portacath or PICC line.   We strive to give you quality time with your provider. You may need to reschedule your appointment if you arrive late (15 or more minutes).  Arriving late affects you and other patients whose appointments are after yours.  Also, if you miss three or more appointments without notifying the office, you may be dismissed from the clinic at the provider's discretion.      For prescription refill requests, have your pharmacy contact our office and allow 72 hours for refills to be completed.    Today you received the following chemotherapy and/or immunotherapy agents: Nivolumab.       To help prevent nausea and vomiting after your treatment, we encourage you to take your nausea medication as directed.  BELOW ARE SYMPTOMS THAT SHOULD BE REPORTED IMMEDIATELY: *FEVER GREATER THAN 100.4 F (38 C) OR HIGHER *CHILLS OR SWEATING *NAUSEA AND VOMITING THAT IS NOT CONTROLLED WITH YOUR NAUSEA MEDICATION *UNUSUAL SHORTNESS OF BREATH *UNUSUAL BRUISING OR BLEEDING *URINARY PROBLEMS (pain or burning when urinating, or frequent urination) *BOWEL PROBLEMS (unusual diarrhea, constipation, pain near the anus) TENDERNESS IN MOUTH AND THROAT WITH OR WITHOUT PRESENCE OF ULCERS (sore throat, sores in mouth, or a toothache) UNUSUAL RASH, SWELLING OR PAIN  UNUSUAL VAGINAL DISCHARGE OR ITCHING   Items with * indicate a potential emergency and should be followed up as soon as possible or go to the Emergency Department if any problems should occur.  Please show the CHEMOTHERAPY ALERT CARD or IMMUNOTHERAPY ALERT CARD at  check-in to the Emergency Department and triage nurse.  Should you have questions after your visit or need to cancel or reschedule your appointment, please contact [place] [facility]  Dept: [phone]  and follow the prompts.  Office hours are 8:00 a.m. to 4:30 p.m. Monday - Friday. Please note that voicemails left after 4:00 p.m. may not be returned until the following business day.  We are closed weekends and major holidays. You have access to a nurse at all times for urgent questions. Please call the main number to the clinic Dept: [phone] and follow the prompts.   For any non-urgent questions, you may also contact your provider using MyChart. We now offer e-Visits for anyone 9 and older to request care online for non-urgent symptoms. For details visit [email or web address].   Also download the MyChart app! Go to the app store, search "MyChart", open the app, select [place], and log in with your MyChart username and password.

## 2023-06-05 ENCOUNTER — Other Ambulatory Visit: Payer: Self-pay

## 2023-06-07 ENCOUNTER — Encounter: Payer: Self-pay | Admitting: Hematology

## 2023-06-10 ENCOUNTER — Encounter: Payer: Self-pay | Admitting: Hematology

## 2023-06-13 ENCOUNTER — Encounter: Payer: Self-pay | Admitting: Hematology

## 2023-06-13 NOTE — Progress Notes (Signed)
This encounter was created in error - please disregard.

## 2023-06-29 ENCOUNTER — Inpatient Hospital Stay: Payer: Medicare HMO | Attending: Hematology

## 2023-06-29 ENCOUNTER — Inpatient Hospital Stay: Payer: Medicare HMO

## 2023-06-29 ENCOUNTER — Inpatient Hospital Stay (HOSPITAL_BASED_OUTPATIENT_CLINIC_OR_DEPARTMENT_OTHER): Payer: Medicare HMO | Admitting: Hematology

## 2023-06-29 VITALS — BP 115/83 | HR 76 | Temp 97.2°F | Resp 18 | Wt 246.7 lb

## 2023-06-29 DIAGNOSIS — C8192 Hodgkin lymphoma, unspecified, intrathoracic lymph nodes: Secondary | ICD-10-CM

## 2023-06-29 DIAGNOSIS — Z79620 Long term (current) use of immunosuppressive biologic: Secondary | ICD-10-CM | POA: Insufficient documentation

## 2023-06-29 DIAGNOSIS — C8102 Nodular lymphocyte predominant Hodgkin lymphoma, intrathoracic lymph nodes: Secondary | ICD-10-CM

## 2023-06-29 DIAGNOSIS — Z5111 Encounter for antineoplastic chemotherapy: Secondary | ICD-10-CM

## 2023-06-29 DIAGNOSIS — C8179 Other classical Hodgkin lymphoma, extranodal and solid organ sites: Secondary | ICD-10-CM | POA: Insufficient documentation

## 2023-06-29 DIAGNOSIS — Z95828 Presence of other vascular implants and grafts: Secondary | ICD-10-CM

## 2023-06-29 DIAGNOSIS — Z5112 Encounter for antineoplastic immunotherapy: Secondary | ICD-10-CM | POA: Diagnosis present

## 2023-06-29 LAB — CBC WITH DIFFERENTIAL (CANCER CENTER ONLY)
Abs Immature Granulocytes: 0.01 10*3/uL (ref 0.00–0.07)
Basophils Absolute: 0 10*3/uL (ref 0.0–0.1)
Basophils Relative: 1 %
Eosinophils Absolute: 0.4 10*3/uL (ref 0.0–0.5)
Eosinophils Relative: 8 %
HCT: 39.3 % (ref 39.0–52.0)
Hemoglobin: 13.7 g/dL (ref 13.0–17.0)
Immature Granulocytes: 0 %
Lymphocytes Relative: 38 %
Lymphs Abs: 2 10*3/uL (ref 0.7–4.0)
MCH: 29.3 pg (ref 26.0–34.0)
MCHC: 34.9 g/dL (ref 30.0–36.0)
MCV: 84.2 fL (ref 80.0–100.0)
Monocytes Absolute: 0.5 10*3/uL (ref 0.1–1.0)
Monocytes Relative: 10 %
Neutro Abs: 2.2 10*3/uL (ref 1.7–7.7)
Neutrophils Relative %: 43 %
Platelet Count: 241 10*3/uL (ref 150–400)
RBC: 4.67 MIL/uL (ref 4.22–5.81)
RDW: 13.5 % (ref 11.5–15.5)
WBC Count: 5.2 10*3/uL (ref 4.0–10.5)
nRBC: 0 % (ref 0.0–0.2)

## 2023-06-29 LAB — CMP (CANCER CENTER ONLY)
ALT: 25 U/L (ref 0–44)
AST: 23 U/L (ref 15–41)
Albumin: 4.2 g/dL (ref 3.5–5.0)
Alkaline Phosphatase: 108 U/L (ref 38–126)
Anion gap: 5 (ref 5–15)
BUN: 11 mg/dL (ref 6–20)
CO2: 28 mmol/L (ref 22–32)
Calcium: 9.3 mg/dL (ref 8.9–10.3)
Chloride: 104 mmol/L (ref 98–111)
Creatinine: 0.82 mg/dL (ref 0.61–1.24)
GFR, Estimated: >60 mL/min (ref >60)
Glucose, Bld: 96 mg/dL (ref 70–99)
Potassium: 3.8 mmol/L (ref 3.5–5.1)
Sodium: 137 mmol/L (ref 135–145)
Total Bilirubin: 0.3 mg/dL (ref 0.3–1.2)
Total Protein: 7.1 g/dL (ref 6.5–8.1)

## 2023-06-29 MED ORDER — SODIUM CHLORIDE 0.9 % IV SOLN: Freq: Once | INTRAVENOUS | Status: AC

## 2023-06-29 MED ORDER — CETIRIZINE HCL 10 MG PO TABS
10.0000 mg | ORAL_TABLET | Freq: Every day | ORAL | Status: DC
Start: 2023-06-29 — End: 2023-06-29
  Administered 2023-06-29: 10 mg via ORAL
  Filled 2023-06-29: qty 1

## 2023-06-29 MED ORDER — ACETAMINOPHEN 325 MG PO TABS
650.0000 mg | ORAL_TABLET | Freq: Once | ORAL | Status: AC
  Administered 2023-06-29: 650 mg via ORAL
  Filled 2023-06-29: qty 2

## 2023-06-29 MED ORDER — SODIUM CHLORIDE 0.9 % IV SOLN
480.0000 mg | Freq: Once | INTRAVENOUS | Status: AC
  Administered 2023-06-29: 480 mg via INTRAVENOUS
  Filled 2023-06-29: qty 48

## 2023-06-29 MED ORDER — SODIUM CHLORIDE 0.9% FLUSH
10.0000 mL | Freq: Once | INTRAVENOUS | Status: AC
Start: 2023-06-29 — End: 2023-06-29
  Administered 2023-06-29: 10 mL

## 2023-06-29 MED ORDER — SODIUM CHLORIDE 0.9% FLUSH
10.0000 mL | INTRAVENOUS | Status: DC | PRN
  Administered 2023-06-29: 10 mL

## 2023-06-29 MED ORDER — HEPARIN SOD (PORK) LOCK FLUSH 100 UNIT/ML IV SOLN
500.0000 [IU] | Freq: Once | INTRAVENOUS | Status: AC | PRN
  Administered 2023-06-29: 500 [IU]

## 2023-06-29 NOTE — Progress Notes (Signed)
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 male who has been referred to [place] by .Lazoff, Shawn P, DO and Dr. Clydell Hakim MD for evaluation and management of newly diagnosed mediastinal mass concerning for likely lymphoma.  Patient has a history of fetal alcohol syndrome with developmental delay, narcolepsy, ADD, exercise-induced asthma and lives with his adoptive parents. He recently presented to the hospital on 07/01/2022 with 2-week history of worsening shortness of breath cough and wheezing.  He received his asthma treatment but was still noted to have significant cough.  In the emergency room he had a chest x-ray which showed an mediastinal mass. Subsequent CT chest with contrast on 07/01/2022 showed extensive mediastinal and hilar lymphadenopathy with conglomerate soft tissue mass/adenopathy within the anterior mediastinum measuring 11.2 x 7.9 x 13 cm. Compression of the left brachiocephalic and SVC within the mid though these vascular structures remain patent.  Patient did not have any clinical signs or symptoms of SVC compression syndrome or left upper extremity swelling.  Patient subsequently had a CT of the abdomen and pelvis which showed no acute intra-abdominal or intrapelvic abnormalities. He underwent a CT-guided core needle biopsy of the mediastinal mass by interventional radiology.  The official pathology results from his biopsy are not currently available at the time of this clinic visit. I did call and talk to the pathologist Dr. Charlotte Harbor Callas and she notes that this either looks like a lymphoma or thymoma and she is running additional tests to make a final  diagnosis.  Patient's father accompanied him for this visit and his mother who helps make most of the decisions in tandem with his father was present on the phone.  They do not note any unexplained fevers chills night sweats or significant weight loss.  His breathing has been relatively stable since hospital discharge but he is still having some cough.  I confirmed adequacy of tissue sampling with the pathologist and the patient was then started on prednisone to try to help his cough by drinking his mediastinal tumor some and reducing bronchial inflammation.  Patient is unable to provide much information or review of systems on account of his developmental delay issues.  Interval History:   Paul [name] is a wonderful 32 y.o. male who is here for continued evaluation and management of classical hogkins lymphoma. He is here to receive cycle 5 day 1 of his treatment, only Nivolumab not ADCETRIS.  Patient was last seen by me on 06/01/2023 and he complained of mild cough, bilateral leg neuropathy, mild hand neuropathy, and hand stiffness.   Patient is accompanied by his father during this visit. Patient notes he has been doing well overall since our last visit. He does complain of bilateral leg neuropathy. However, he notes his leg symptoms have improved since he started wearing running shoes. He has been referred to orthopedic surgeon.   Patient notes he still falls asleep whenever and he will follow-up with his physician.   He denies any new infection issues, fever, chills, night sweats, unexpected weight loss, back pain, chest pain, abdominal pain, or leg swelling.   Patient notes he has been tolerating his current treatment well without any new or severe toxicities.   MEDICAL HISTORY:  Past Medical History:  Diagnosis Date   ADD (attention deficit disorder)    ADHD (attention deficit hyperactivity disorder)    Asthma    excercise induced and pollen   Auditory processing disorder     Complication of anesthesia    mother states pt. is harder to put under anes. and hard to wake up due to fetal alcohol syndrome; can be combative, per mother; states he will do better if mother is in PACU when he is coming out of anes.   Development delay    states mental status of a 32 year old   Exercise-induced asthma    prn inhaler/neb.   Fetal alcohol syndrome    Narcolepsy    Non-restorable tooth 09/2015   teeth   Separation anxiety    Twin birth     SURGICAL HISTORY: Past Surgical History:  Procedure Laterality Date   IR IMAGING GUIDED PORT INSERTION  08/11/2022   MULTIPLE EXTRACTIONS WITH ALVEOLOPLASTY N/A 09/23/2015   Procedure: MULTIPLE EXTRACTION WITH ALVEOLOPLASTY;  Surgeon: Ocie Doyne, DDS;  Location: [facility];  Service: Oral Surgery;  Laterality: N/A;   PYLOROMYOTOMY     age [age], performed found to not have stenosis on surgical exam   PYLOROMYOTOMY     did not have pyloric stenosis; did surgery on the wrong twin   RADIOLOGY WITH ANESTHESIA N/A 01/16/2020   Procedure: MRI WITH ANESTHESIA  BRAIN WITH AND WITHOUT CONTRAST;  Surgeon: Radiologist, Medication, MD;  Location: [facility] OR;  Service: Radiology;  Laterality: N/A;    SOCIAL HISTORY: Social History   Socioeconomic History   Marital status: Single    Spouse name: Not on file   Number of children: Not on file   Years of education: Not on file   Highest education level: Not on file  Occupational History   Not on file  Tobacco Use   Smoking status: Never   Smokeless tobacco: Never  Vaping Use   Vaping status: Never Used  Substance and Sexual Activity   Alcohol use: No   Drug use: No   Sexual activity: Not on file  Other Topics Concern   Not on file  Social History Narrative   ** Merged History Encounter **       Social Determinants of Health   Financial Resource Strain: Low Risk  (04/09/2021)   Received from [facility] visits prior to 11/07/2022., [facility] visits prior to 11/07/2022.   Overall Financial Resource Strain (CARDIA)    Difficulty of Paying Living Expenses: Not hard at all  Food Insecurity: No Food Insecurity (05/13/2023)   Hunger Vital Sign    Worried About Running Out of Food in the Last Year: Never true    Ran Out of Food in the Last Year: Never true  Transportation Needs: No Transportation Needs (05/13/2023)   PRAPARE - Administrator, Civil Service (Medical): No    Lack of Transportation (Non-Medical): No  Physical Activity: Insufficiently Active (04/09/2021)   Received from [facility] visits prior to 11/07/2022., [facility] visits prior to 11/07/2022.   Exercise Vital Sign    Days of Exercise per Week: 5 days    Minutes of Exercise per Session: 10 min  Stress: No Stress Concern Present (04/09/2021)   [facility] from [facility] visits prior to 11/07/2022., [facility] visits prior to 11/07/2022.   [employer] of Occupational Health -  Occupational Stress Questionnaire    Feeling of Stress : Only a little  Social Connections: Socially Isolated (04/09/2021)   Received from [facility] visits prior to 11/07/2022., [facility] visits prior to 11/07/2022.   Social Advertising account executive [NHANES]    Frequency of Communication with Friends and Family: Never    Frequency of Social Gatherings with Friends and Family: Once a week    Attends Religious Services: Never    Database administrator or Organizations: Yes    Attends Engineer, structural: More than 4 times per year    Marital Status: Never married  Intimate Partner Violence: Not At Risk (05/13/2023)   Humiliation, Afraid, Rape, and Kick questionnaire    Fear of Current or Ex-Partner: No    Emotionally Abused: No    Physically Abused: No    Sexually Abused: No    FAMILY HISTORY: Family History  Adopted: Yes   Problem Relation Age of Onset   Mental illness Mother    Diabetes Mother    Hypertension Mother    Schizophrenia Mother    Bipolar disorder Mother    Mental retardation Father    Sleep apnea Neg Hx     ALLERGIES:  is allergic to other.  MEDICATIONS:  Current Outpatient Medications  Medication Sig Dispense Refill   albuterol (PROVENTIL HFA;VENTOLIN HFA) 108 (90 BASE) MCG/ACT inhaler Inhale 2 puffs into the lungs every 6 (six) hours as needed for wheezing or shortness of breath.     albuterol (PROVENTIL) (5 MG/ML) 0.5% nebulizer solution Take 2.5 mg by nebulization every 6 (six) hours as needed for wheezing or shortness of breath.     amoxicillin-clavulanate (AUGMENTIN) 875-125 MG tablet Take 1 tablet by mouth 2 (two) times daily. 10 tablet 0   DULoxetine (CYMBALTA) 20 MG capsule Take 1 capsule (20 mg total) by mouth daily. 30 capsule 1   guaiFENesin (ROBITUSSIN) 100 MG/5ML liquid Take 5 mLs by mouth every 4 (four) hours as needed for cough or to loosen phlegm. 120 mL 0   ibuprofen (ADVIL) 800 MG tablet Take 800 mg by mouth every 8 (eight) hours as needed for mild pain or headache.     lidocaine-prilocaine (EMLA) cream Apply to affected area once (Patient taking differently: Apply 1 Application topically as needed (for port access).) 30 g 3   loratadine (CLARITIN) 10 MG tablet Take 10 mg by mouth daily as needed for allergies or rhinitis.     Multiple Vitamin (MULTIVITAMIN WITH MINERALS) TABS tablet Take 1 tablet by mouth daily with breakfast.     ondansetron (ZOFRAN) 4 MG tablet Take 1 tablet (4 mg total) by mouth every 6 (six) hours as needed for nausea. 20 tablet 0   prochlorperazine (COMPAZINE) 10 MG tablet Take 1 tablet (10 mg total) by mouth every 6 (six) hours as needed for nausea or vomiting. 30 tablet 1   Vitamin D, Ergocalciferol, (DRISDOL) 1.25 MG (50000 UNIT) CAPS capsule Take 50,000 Units by mouth every Monday.     No current facility-administered medications for this visit.     REVIEW OF SYSTEMS:    10 Point review of Systems was done is negative except as noted above.   PHYSICAL EXAMINATION  BP 115/83   Pulse 76   Temp (!) 97.2 F (36.2 C)   Resp 18   Wt 246 lb 11.2 oz (111.9 kg)   SpO2 100%   BMI 35.40 kg/m  GENERAL:alert, in no acute distress  and comfortable SKIN: no acute rashes, no significant lesions EYES: conjunctiva are pink and non-injected, sclera anicteric OROPHARYNX: MMM, no exudates, no oropharyngeal erythema or ulceration NECK: supple, no JVD LYMPH:  no palpable lymphadenopathy in the cervical, axillary or inguinal regions LUNGS: clear to auscultation b/l with normal respiratory effort HEART: regular rate & rhythm ABDOMEN:  normoactive bowel sounds , non tender, not distended. No splenomegaly. Extremity: no pedal edema PSYCH: alert & oriented x 3 with fluent speech NEURO: no focal motor/sensory deficits   LABORATORY DATA:  I have reviewed the data as listed  .    Latest Ref Rng & Units 06/29/2023    1:14 PM 06/01/2023   12:37 PM 05/15/2023    2:16 AM  CBC  WBC 4.0 - 10.5 K/uL 5.2  4.0  4.7   Hemoglobin 13.0 - 17.0 g/dL 19.1  47.8  29.5   Hematocrit 39.0 - 52.0 % 39.3  38.9  37.1   Platelets 150 - 400 K/uL 241  220  248    ANC 300  .    Latest Ref Rng & Units 06/29/2023    1:14 PM 06/01/2023   12:37 PM 05/15/2023    2:16 AM  CMP  Glucose 70 - 99 mg/dL 96  97  99   BUN 6 - 20 mg/dL 11  16  13    Creatinine 0.61 - 1.24 mg/dL 6.21  3.08  6.57   Sodium 135 - 145 mmol/L 137  136  135   Potassium 3.5 - 5.1 mmol/L 3.8  3.8  3.6   Chloride 98 - 111 mmol/L 104  104  98   CO2 22 - 32 mmol/L 28  27  27    Calcium 8.9 - 10.3 mg/dL 9.3  9.1  8.8   Total Protein 6.5 - 8.1 g/dL 7.1  7.0    Total Bilirubin 0.3 - 1.2 mg/dL 0.3  0.3    Alkaline Phos 38 - 126 U/L 108  116    AST 15 - 41 U/L 23  22    ALT 0 - 44 U/L 25  27     . Lab Results  Component Value Date   LDH 171 07/03/2022   RADIOGRAPHIC STUDIES: I have personally  reviewed the radiological images as listed and agreed with the findings in the report. No results found.  Surgical Pathology result from 07/03/2022: FINAL MICROSCOPIC DIAGNOSIS:  A. MEDIASTINAL MASS, ANTERIOR, NEEDLE CORE BIOPSY: -Atypical lymphoid proliferation consistent with classical Hodgkin lymphoma -See comment  COMMENT:  The sections show needle core biopsy fragments displaying a nodular and diffuse lymphoid proliferation associated with dense sclerosis.  The lymphoid process shows predominance of small lymphoid cells admixed to a lesser extent with histiocytes, eosinophils in addition to the large atypical mononuclear and occasionally lobated lymphoid appearing cells with variably prominent nucleoli.  Flow cytometric analysis was performed [facility] [phone]) and shows predominance of CD4-positive T cells. No monoclonal B-cell population identified.  In addition, a battery of immunohistochemical stains was performed including CD30, CD15, mum 1, LCA, CD20, PAX5, CD3, CD5, CD4, CD8, TdT, cytokeratin AE1/AE3, cytokeratin 8/18 and EBV in situ hybridization with appropriate controls.  The large atypical lymphoid appearing cells are positive for CD15, CD30, PAX5, mum 1 and rare cells for CD20.  No significant staining is seen with LCA, EBV, CD3, CD5, CD4, CD8.  Cytokeratin stains highlight a small focus of positivity likely representing native epithelial elements.  No significant TdT positivity is identified.  The small lymphocytes in the background  show a mixture of T and B cells with predominance of T cells.  The latter show predominance of CD4 positive cells.  Overall, the features are atypical and most consistent with classical Hodgkin lymphoma which is best subclassified as nodular sclerosis type.   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 The mass was causing some compression of his SVC and left brachiocephalic vein but no complete obstruction or symptoms related to this. No constitutional symptoms.  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  PLAN: -Patient only received Nivolumab treatment without ADCETRIS. He tolerated cycle 4 well without any new or severe toxicities.  -Patient can continue cycle 5 day 1 of his treatment, only Nivolumab and not ADCETRIS, during this visit. Oral Zyrtec instead of Benadryl and no steroids.  -Continue B-complex supplement.  -Discussed lab results from today, 06/29/2023, in detail with the patient. CBC is stable. CMP is stable -Schedule PET scan before next visit.    FOLLOW-UP: PET/CT in 2 weeks  Plz schedule next 2 doses of every 4 weekly Nivolumab with portflush labs and MD visit  The total time spent in the appointment was 30 minutes* .  All of the patient's questions were answered with apparent satisfaction. The patient knows to call the clinic with any problems, questions or concerns.   Wyvonnia Lora MD MS AAHIVMS [facility] Hematology/Oncology Physician [facility]  .*Total Encounter Time as defined by the Centers for Medicare and Medicaid Services includes, in addition to the face-to-face time of a patient visit (documented in the note above) non-face-to-face time: obtaining and reviewing outside history, ordering and reviewing medications, tests or procedures, care coordination (communications with other health care professionals or caregivers) and documentation in the medical record.   I,Param Shah,acting as a Neurosurgeon for Wyvonnia Lora, MD.,have documented all relevant documentation on the behalf of Wyvonnia Lora, MD,as directed by  Wyvonnia Lora, MD while in the presence of Wyvonnia Lora, MD.   .I have  reviewed the above documentation for accuracy and completeness, and I agree with the above. Johney Maine MD

## 2023-06-29 NOTE — Patient Instructions (Signed)
[facility]  Discharge Instructions: Thank you for choosing [facility] to provide your oncology and hematology care.   If you have a lab appointment with the Cancer Center, please go directly to the Cancer Center and check in at the registration area.   Wear comfortable clothing and clothing appropriate for easy access to any Portacath or PICC line.   We strive to give you quality time with your provider. You may need to reschedule your appointment if you arrive late (15 or more minutes).  Arriving late affects you and other patients whose appointments are after yours.  Also, if you miss three or more appointments without notifying the office, you may be dismissed from the clinic at the provider's discretion.      For prescription refill requests, have your pharmacy contact our office and allow 72 hours for refills to be completed.    Today you received the following chemotherapy and/or immunotherapy agents: Nivolumab.       To help prevent nausea and vomiting after your treatment, we encourage you to take your nausea medication as directed.  BELOW ARE SYMPTOMS THAT SHOULD BE REPORTED IMMEDIATELY: *FEVER GREATER THAN 100.4 F (38 C) OR HIGHER *CHILLS OR SWEATING *NAUSEA AND VOMITING THAT IS NOT CONTROLLED WITH YOUR NAUSEA MEDICATION *UNUSUAL SHORTNESS OF BREATH *UNUSUAL BRUISING OR BLEEDING *URINARY PROBLEMS (pain or burning when urinating, or frequent urination) *BOWEL PROBLEMS (unusual diarrhea, constipation, pain near the anus) TENDERNESS IN MOUTH AND THROAT WITH OR WITHOUT PRESENCE OF ULCERS (sore throat, sores in mouth, or a toothache) UNUSUAL RASH, SWELLING OR PAIN  UNUSUAL VAGINAL DISCHARGE OR ITCHING   Items with * indicate a potential emergency and should be followed up as soon as possible or go to the Emergency Department if any problems should occur.  Please show the CHEMOTHERAPY ALERT CARD or IMMUNOTHERAPY ALERT CARD at  check-in to the Emergency Department and triage nurse.  Should you have questions after your visit or need to cancel or reschedule your appointment, please contact  [facility]  Dept: [phone]  and follow the prompts.  Office hours are 8:00 a.m. to 4:30 p.m. Monday - Friday. Please note that voicemails left after 4:00 p.m. may not be returned until the following business day.  We are closed weekends and major holidays. You have access to a nurse at all times for urgent questions. Please call the main number to the clinic Dept: [phone] and follow the prompts.   For any non-urgent questions, you may also contact your provider using MyChart. We now offer e-Visits for anyone 18 and older to request care online for non-urgent symptoms. For details visit [email or web address].   Also download the MyChart app! Go to the app store, search "MyChart", open the app, select , and log in with your MyChart username and password.   

## 2023-07-02 ENCOUNTER — Ambulatory Visit (INDEPENDENT_AMBULATORY_CARE_PROVIDER_SITE_OTHER): Payer: Medicare HMO | Admitting: Podiatry

## 2023-07-02 ENCOUNTER — Encounter: Payer: Self-pay | Admitting: Podiatry

## 2023-07-02 DIAGNOSIS — M722 Plantar fascial fibromatosis: Secondary | ICD-10-CM | POA: Diagnosis not present

## 2023-07-02 NOTE — Progress Notes (Signed)
Subjective:  Patient ID: Paul [name], male    DOB: [birth year],  MRN: [id number]  Chief Complaint  Patient presents with   Plantar Fasciitis    RM# 10 Patient referred by his PCP to get treatment for bilateral plantar fasciitis     32 y.o. male presents with the above complaint.  Patient presents with bilateral heel pain that has been for quite some time.  Patient was referred to me by his PCP.  He wants to get it evaluated he has not seen anyone as prior to seeing me.  Denies any other acute, pain scale 7 out of 10 dull aching nature.  Is been going for a year   Review of Systems: Negative except as noted in the HPI. Denies N/V/F/Ch.  Past Medical History:  Diagnosis Date   ADD (attention deficit disorder)    ADHD (attention deficit hyperactivity disorder)    Asthma    excercise induced and pollen   Auditory processing disorder    Complication of anesthesia    mother states pt. is harder to put under anes. and hard to wake up due to fetal alcohol syndrome; can be combative, per mother; states he will do better if mother is in PACU when he is coming out of anes.   Development delay    states mental status of a 32 year old   Exercise-induced asthma    prn inhaler/neb.   Fetal alcohol syndrome    Narcolepsy    Non-restorable tooth 09/2015   teeth   Separation anxiety    Twin birth     Current Outpatient Medications:    albuterol (PROVENTIL HFA;VENTOLIN HFA) 108 (90 BASE) MCG/ACT inhaler, Inhale 2 puffs into the lungs every 6 (six) hours as needed for wheezing or shortness of breath., Disp: , Rfl:    albuterol (PROVENTIL) (5 MG/ML) 0.5% nebulizer solution, Take 2.5 mg by nebulization every 6 (six) hours as needed for wheezing or shortness of breath., Disp: , Rfl:    amoxicillin-clavulanate (AUGMENTIN) 875-125 MG tablet, Take 1 tablet by mouth 2 (two) times daily., Disp: 10 tablet, Rfl: 0   guaiFENesin (ROBITUSSIN) 100 MG/5ML liquid, Take 5 mLs by mouth every 4 (four) hours as  needed for cough or to loosen phlegm., Disp: 120 mL, Rfl: 0   ibuprofen (ADVIL) 800 MG tablet, Take 800 mg by mouth every 8 (eight) hours as needed for mild pain or headache., Disp: , Rfl:    lidocaine-prilocaine (EMLA) cream, Apply to affected area once (Patient taking differently: Apply 1 Application topically as needed (for port access).), Disp: 30 g, Rfl: 3   loratadine (CLARITIN) 10 MG tablet, Take 10 mg by mouth daily as needed for allergies or rhinitis., Disp: , Rfl:    Multiple Vitamin (MULTIVITAMIN WITH MINERALS) TABS tablet, Take 1 tablet by mouth daily with breakfast., Disp: , Rfl:    ondansetron (ZOFRAN) 4 MG tablet, Take 1 tablet (4 mg total) by mouth every 6 (six) hours as needed for nausea., Disp: 20 tablet, Rfl: 0   prochlorperazine (COMPAZINE) 10 MG tablet, Take 1 tablet (10 mg total) by mouth every 6 (six) hours as needed for nausea or vomiting. (Patient not taking: Reported on 07/13/2023), Disp: 30 tablet, Rfl: 1   Vitamin D, Ergocalciferol, (DRISDOL) 1.25 MG (50000 UNIT) CAPS capsule, Take 50,000 Units by mouth every Monday., Disp: , Rfl:    DULoxetine (CYMBALTA) 20 MG capsule, TAKE 1 CAPSULE BY MOUTH DAILY, Disp: 30 capsule, Rfl: 1   modafinil (PROVIGIL) 200 MG tablet, Take  200 mg by mouth daily., Disp: , Rfl:   Social History   Tobacco Use  Smoking Status Never  Smokeless Tobacco Never    Allergies  Allergen Reactions   Gabapentin     MAKES HIM GO INTO A DEEP SLEEP   Other Other (See Comments)    Fetal alcohol syndrome  Certain (unnamed) medications have adverse effects on him. Must have heavy anesthesia meds in order to work.   Objective:  There were no vitals filed for this visit. There is no height or weight on file to calculate BMI. Constitutional Well developed. Well nourished.  Vascular Dorsalis pedis pulses palpable bilaterally. Posterior tibial pulses palpable bilaterally. Capillary refill normal to all digits.  No cyanosis or clubbing noted. Pedal  hair growth normal.  Neurologic Normal speech. Oriented to person, place, and time. Epicritic sensation to light touch grossly present bilaterally.  Dermatologic Nails well groomed and normal in appearance. No open wounds. No skin lesions.  Orthopedic: Normal joint ROM without pain or crepitus bilaterally. No visible deformities. Tender to palpation at the calcaneal tuber bilaterally. No pain with calcaneal squeeze bilaterally. Ankle ROM diminished range of motion bilaterally. Silfverskiold Test: positive bilaterally.   Radiographs: None  Assessment:   1. Plantar fasciitis of right foot   2. Plantar fasciitis of left foot    Plan:  Patient was evaluated and treated and all questions answered.  Plantar Fasciitis, bilaterally - XR reviewed as above.  - Educated on icing and stretching. Instructions given.  - Injection delivered to the plantar fascia as below. - DME: Plantar fascial brace dispensed to support the medial longitudinal arch of the foot and offload pressure from the heel and prevent arch collapse during weightbearing - Pharmacologic management: None  Procedure: Injection Tendon/Ligament Location: Bilateral plantar fascia at the glabrous junction; medial approach. Skin Prep: alcohol Injectate: 0.5 cc 0.5% marcaine plain, 0.5 cc of 1% Lidocaine, 0.5 cc kenalog 10. Disposition: Patient tolerated procedure well. Injection site dressed with a band-aid.  No follow-ups on file.

## 2023-07-05 ENCOUNTER — Encounter: Payer: Self-pay | Admitting: Hematology

## 2023-07-05 ENCOUNTER — Other Ambulatory Visit: Payer: Self-pay | Admitting: Hematology

## 2023-07-13 ENCOUNTER — Encounter: Payer: Self-pay | Admitting: Cardiology

## 2023-07-13 ENCOUNTER — Ambulatory Visit: Payer: Medicare HMO | Attending: Cardiology | Admitting: Cardiology

## 2023-07-13 VITALS — BP 110/82 | HR 84 | Ht 70.0 in | Wt 243.0 lb

## 2023-07-13 DIAGNOSIS — R5382 Chronic fatigue, unspecified: Secondary | ICD-10-CM

## 2023-07-13 DIAGNOSIS — R0789 Other chest pain: Secondary | ICD-10-CM

## 2023-07-13 NOTE — Patient Instructions (Signed)
Medication Instructions:  Your physician recommends that you continue on your current medications as directed. Please refer to the Current Medication list given to you today.  *If you need a refill on your cardiac medications before your next appointment, please call your pharmacy*   Lab Work: None   Testing/Procedures: Your physician has requested that you have an echocardiogram. Echocardiography is a painless test that uses sound waves to create images of your heart. It provides your doctor with information about the size and shape of your heart and how well your heart's chambers and valves are working. This procedure takes approximately one hour. There are no restrictions for this procedure. Please do NOT wear cologne, perfume, aftershave, or lotions (deodorant is allowed). Please arrive 15 minutes prior to your appointment time.  Please note: We ask at that you not bring children with you during ultrasound (echo/ vascular) testing. Due to room size and safety concerns, children are not allowed in the ultrasound rooms during exams. Our front office staff cannot provide observation of children in our lobby area while testing is being conducted. An adult accompanying a patient to their appointment will only be allowed in the ultrasound room at the discretion of the ultrasound technician under special circumstances. We apologize for any inconvenience.    Follow-Up: At [facility], you and your health needs are our priority.  As part of our continuing mission to provide you with exceptional heart care, we have created designated Provider Care Teams.  These Care Teams include your primary Cardiologist (physician) and Advanced Practice Providers (APPs -  Physician Assistants and Nurse Practitioners) who all work together to provide you with the care you need, when you need it.  Your next appointment:   1 year(s)  Provider:   Thomasene Ripple, DO

## 2023-07-13 NOTE — Progress Notes (Signed)
Cardiology Office Note:    Date:  07/17/2023   ID:  Paul [name], DOB 07/20/1991, MRN [id number]  PCP:  Paul Marlin, DO  Cardiologist:  None  Electrophysiologist:  None   Referring MD: Paul Marlin, DO   " I am ok"   History of Present Illness:    Paul [name] is a 32 y.o. male with a hx of  cancer, is currently undergoing immunotherapy after previously receiving chemotherapy. The patient has not previously had a heart ultrasound.  In addition to the cancer, Paul [name] has been experiencing significant fatigue, sleeping for up to 24 hours at a time and being difficult to wake. He has also been very lethargic and has experienced memory loss, forgetting things he used to know and struggling with tasks he used to perform with ease. He uses a CPAP machine at night for sleep apnea.  He reports intermittent chest discomfort, did not really eloborate on this.  The patient also has a history of fetal alcohol syndrome, having been adopted from a mother who drank and used drugs during pregnancy. The full extent of the potential effects of this condition on his current health status is unknown.  Past Medical History:  Diagnosis Date   ADD (attention deficit disorder)    ADHD (attention deficit hyperactivity disorder)    Asthma    excercise induced and pollen   Auditory processing disorder    Complication of anesthesia    mother states pt. is harder to put under anes. and hard to wake up due to fetal alcohol syndrome; can be combative, per mother; states he will do better if mother is in PACU when he is coming out of anes.   Development delay    states mental status of a 32 year old   Exercise-induced asthma    prn inhaler/neb.   Fetal alcohol syndrome    Narcolepsy    Non-restorable tooth 09/2015   teeth   Separation anxiety    Twin birth     Past Surgical History:  Procedure Laterality Date   IR IMAGING GUIDED PORT INSERTION  08/11/2022   MULTIPLE EXTRACTIONS WITH  ALVEOLOPLASTY N/A 09/23/2015   Procedure: MULTIPLE EXTRACTION WITH ALVEOLOPLASTY;  Surgeon: Paul [name], DDS;  Location: [facility];  Service: Oral Surgery;  Laterality: N/A;   PYLOROMYOTOMY     age [age], performed found to not have stenosis on surgical exam   PYLOROMYOTOMY     did not have pyloric stenosis; did surgery on the wrong twin   RADIOLOGY WITH ANESTHESIA N/A 01/16/2020   Procedure: MRI WITH ANESTHESIA  BRAIN WITH AND WITHOUT CONTRAST;  Surgeon: Radiologist, Medication, MD;  Location: [facility] OR;  Service: Radiology;  Laterality: N/A;    Current Medications: Current Meds  Medication Sig   albuterol (PROVENTIL HFA;VENTOLIN HFA) 108 (90 BASE) MCG/ACT inhaler Inhale 2 puffs into the lungs every 6 (six) hours as needed for wheezing or shortness of breath.   albuterol (PROVENTIL) (5 MG/ML) 0.5% nebulizer solution Take 2.5 mg by nebulization every 6 (six) hours as needed for wheezing or shortness of breath.   ibuprofen (ADVIL) 800 MG tablet Take 800 mg by mouth every 8 (eight) hours as needed for mild pain or headache.   lidocaine-prilocaine (EMLA) cream Apply to affected area once (Patient taking differently: Apply 1 Application topically as needed (for port access).)   loratadine (CLARITIN) 10 MG tablet Take 10 mg by mouth daily as needed for allergies or rhinitis.   modafinil (PROVIGIL) 200 MG  tablet Take 200 mg by mouth daily.   Multiple Vitamin (MULTIVITAMIN WITH MINERALS) TABS tablet Take 1 tablet by mouth daily with breakfast.   ondansetron (ZOFRAN) 4 MG tablet Take 1 tablet (4 mg total) by mouth every 6 (six) hours as needed for nausea.   Vitamin D, Ergocalciferol, (DRISDOL) 1.25 MG (50000 UNIT) CAPS capsule Take 50,000 Units by mouth every Monday.     Allergies:   Gabapentin and Other   Social History   Socioeconomic History   Marital status: Single    Spouse name: Not on file   Number of children: Not on file   Years of education: Not on file   Highest  education level: Not on file  Occupational History   Not on file  Tobacco Use   Smoking status: Never   Smokeless tobacco: Never  Vaping Use   Vaping status: Never Used  Substance and Sexual Activity   Alcohol use: No   Drug use: No   Sexual activity: Not on file  Other Topics Concern   Not on file  Social History Narrative   ** Merged History Encounter **       Social Determinants of Health   Financial Resource Strain: Low Risk  (04/09/2021)   Received from [facility] visits prior to 11/07/2022., [facility] visits prior to 11/07/2022.   Overall Financial Resource Strain (CARDIA)    Difficulty of Paying Living Expenses: Not hard at all  Food Insecurity: Low Risk  (07/01/2023)   [facility]   Hunger Vital Sign    Worried About Running Out of Food in the Last Year: Never true    Ran Out of Food in the Last Year: Never true  Transportation Needs: No Transportation Needs (07/01/2023)   Received from [employer]    In the past 12 months, has lack of reliable transportation kept you from medical appointments, meetings, work or from getting things needed for daily living? : No  Physical Activity: Insufficiently Active (04/09/2021)   Received from [facility] visits prior to 11/07/2022., [facility] visits prior to 11/07/2022.   Exercise Vital Sign    Days of Exercise per Week: 5 days    Minutes of Exercise per Session: 10 min  Stress: No Stress Concern Present (04/09/2021)   Received from [facility] visits prior to 11/07/2022., [facility] visits prior to 11/07/2022.   [employer] of Occupational Health - Occupational Stress Questionnaire    Feeling of Stress : Only a little  Social Connections: Socially Isolated (04/09/2021)   Received from [facility] visits prior to 11/07/2022., [facility] visits prior to 11/07/2022.   Social Advertising account executive [NHANES]    Frequency of Communication with Friends and Family: Never    Frequency of Social Gatherings with Friends and Family: Once a week    Attends Religious Services: Never    Database administrator or Organizations: Yes    Attends Engineer, structural: More than 4 times per year    Marital Status: Never married     Family History: The patient's family history includes Bipolar disorder in his mother; Diabetes in his mother; Hypertension in his mother; Mental illness in his mother; Mental retardation in his father; Schizophrenia in his mother. There is no history of Sleep apnea. He was adopted.  ROS:  Review of Systems  Constitution: Negative for decreased appetite, fever and weight gain.  HENT: Negative for congestion, ear discharge, hoarse voice and sore throat.   Eyes: Negative for discharge, redness, vision loss in right eye and visual halos.  Cardiovascular: Negative for chest pain, dyspnea on exertion, leg swelling, orthopnea and palpitations.  Respiratory: Negative for cough, hemoptysis, shortness of breath and snoring.   Endocrine: Negative for heat intolerance and polyphagia.  Hematologic/Lymphatic: Negative for bleeding problem. Does not bruise/bleed easily.  Skin: Negative for flushing, nail changes, rash and suspicious lesions.  Musculoskeletal: Negative for arthritis, joint pain, muscle cramps, myalgias, neck pain and stiffness.  Gastrointestinal: Negative for abdominal pain, bowel incontinence, diarrhea and excessive appetite.  Genitourinary: Negative for decreased libido, genital sores and incomplete emptying.  Neurological: Negative for brief paralysis, focal weakness, headaches and loss of balance.  Psychiatric/Behavioral: Negative for altered mental status, depression and suicidal ideas.  Allergic/Immunologic: Negative for HIV exposure and persistent infections.    EKGs/Labs/Other  Studies Reviewed:    The following studies were reviewed today:   EKG:  The ekg ordered today demonstrates   Recent Labs: 05/13/2023: Magnesium 2.1 06/29/2023: ALT 25; BUN 11; Creatinine 0.82; Hemoglobin 13.7; Platelet Count 241; Potassium 3.8; Sodium 137  Recent Lipid Panel No results found for: "CHOL", "TRIG", "HDL", "CHOLHDL", "VLDL", "LDLCALC", "LDLDIRECT"  Physical Exam:    VS:  BP 110/82 (BP Location: Left Arm, Patient Position: Sitting, Cuff Size: Normal)   Pulse 84   Ht 5\' 10"  (1.778 m)   Wt 243 lb (110.2 kg)   SpO2 94%   BMI 34.87 kg/m     Wt Readings from Last 3 Encounters:  07/13/23 243 lb (110.2 kg)  06/29/23 246 lb 11.2 oz (111.9 kg)  06/01/23 246 lb 6.4 oz (111.8 kg)     GEN: Well nourished, well developed in no acute distress HEENT: Normal NECK: No JVD; No carotid bruits LYMPHATICS: No lymphadenopathy CARDIAC: S1S2 noted,RRR, no murmurs, rubs, gallops RESPIRATORY:  Clear to auscultation without rales, wheezing or rhonchi  ABDOMEN: Soft, non-tender, non-distended, +bowel sounds, no guarding. EXTREMITIES: No edema, No cyanosis, no clubbing MUSCULOSKELETAL:  No deformity  SKIN: Warm and dry NEUROLOGIC:  Alert and oriented x 3, non-focal PSYCHIATRIC:  Normal affect, good insight  ASSESSMENT:    1. Atypical chest pain   2. Chronic fatigue   3. Morbid obesity (HCC)    PLAN:    Cancer Treatment Related Cardiotoxicity Concern for potential cardiac damage due to chemotherapy and immunotherapy. No current symptoms of heart failure. -Order echocardiogram to assess cardiac function and rule out cardiotoxicity.  Chronic Fatigue Reports excessive sleepiness and lethargy, possibly related to cancer treatment. -Continue to monitor and manage symptomatically.  Fetal Alcohol Syndrome History of Fetal Alcohol Syndrome, which may have unknown impacts on current health status. -Continue to monitor for potential related health issues.  General Health Maintenance /  Followup Plans -If echocardiogram is normal, follow-up on an annual basis with repeat echocardiogram. -If echocardiogram is abnormal, patient to be called back to discuss further management plans.  The patient is in agreement with the above plan. The patient left the office in stable condition.  The patient will follow up in   Medication Adjustments/Labs and Tests Ordered: Current medicines are reviewed at length with the patient today.  Concerns regarding medicines are outlined above.  Orders Placed This Encounter  Procedures   ECHOCARDIOGRAM COMPLETE   No orders of the defined types were placed in this encounter.   Patient Instructions  Medication Instructions:  Your physician recommends that you continue on your current medications as directed. Please refer to the Current Medication list given to you today.  *If you need a refill on your cardiac medications before your next appointment, please call your pharmacy*   Lab Work: None   Testing/Procedures: Your physician has requested that you have an echocardiogram. Echocardiography is a painless test that uses sound waves to create images of your heart. It provides your doctor with information about the size and shape of your heart and how well your heart's chambers and valves are working. This procedure takes approximately one hour. There are no restrictions for this procedure. Please do NOT wear cologne, perfume, aftershave, or lotions (deodorant is allowed). Please arrive 15 minutes prior to your appointment time.  Please note: We ask at that you not bring children with you during ultrasound (echo/ vascular) testing. Due to room size and safety concerns, children are not allowed in the ultrasound rooms during exams. Our front office staff cannot provide observation of children in our lobby area while testing is being conducted. An adult accompanying a patient to their appointment will only be allowed in the ultrasound room at the  discretion of the ultrasound technician under special circumstances. We apologize for any inconvenience.    Follow-Up: At [facility], you and your health needs are our priority.  As part of our continuing mission to provide you with exceptional heart care, we have created designated Provider Care Teams.  These Care Teams include your primary Cardiologist (physician) and Advanced Practice Providers (APPs -  Physician Assistants and Nurse Practitioners) who all work together to provide you with the care you need, when you need it.  Your next appointment:   1 year(s)  Provider:   Thomasene Ripple, DO     Adopting a Healthy Lifestyle.  Know what a healthy weight is for you (roughly BMI <25) and aim to maintain this   Aim for 7+ servings of fruits and vegetables daily   65-80+ fluid ounces of water or unsweet tea for healthy kidneys   Limit to max 1 drink of alcohol per day; avoid smoking/tobacco   Limit animal fats in diet for cholesterol and heart health - choose grass fed whenever available   Avoid highly processed foods, and foods high in saturated/trans fats   Aim for low stress - take time to unwind and care for your mental health   Aim for 150 min of moderate intensity exercise weekly for heart health, and weights twice weekly for bone health   Aim for 7-9 hours of sleep daily   When it comes to diets, agreement about the perfect plan isnt easy to find, even among the experts. Experts at the [facility] [employer] developed an idea known as the Healthy Eating Plate. Just imagine a plate divided into logical, healthy portions.   The emphasis is on diet quality:   Load up on vegetables and fruits - one-half of your plate: Aim for color and variety, and remember that potatoes dont count.   Go for whole grains - one-quarter of your plate: Whole wheat, barley, wheat berries, quinoa, oats, brown rice, and foods made with them. If you want pasta, go with whole  wheat pasta.   Protein power - one-quarter of your plate: Fish, chicken, beans, and nuts are all healthy, versatile protein sources. Limit red meat.   The diet, however, does go beyond the plate, offering a few other suggestions.  Use healthy plant oils, such as olive, canola, soy, corn, sunflower and peanut. Check the labels, and avoid partially hydrogenated oil, which have unhealthy trans fats.   If youre thirsty, drink water. Coffee and tea are good in moderation, but skip sugary drinks and limit milk and dairy products to one or two daily servings.   The type of carbohydrate in the diet is more important than the amount. Some sources of carbohydrates, such as vegetables, fruits, whole grains, and beans-are healthier than others.   Finally, stay active  Signed, Thomasene Ripple, DO  07/17/2023 12:59 PM    [facility]

## 2023-07-14 ENCOUNTER — Telehealth: Payer: Self-pay

## 2023-07-14 NOTE — Telephone Encounter (Signed)
Received new sleep referral from Dr. Vivi Ferns for OSA and fatigue. Saw Dr Frances Furbish in the past and wants to re-establish care. Placed in sleep mailbox

## 2023-07-19 NOTE — Telephone Encounter (Signed)
Referral to our office not needed, pt is established already and had a follow up visit regarding OSA and CPAP supplies in January of this year. If patient has any further concerns or changes they may schedule an office visit in our office with Dr. Frances Furbish or Grant Fontana, NP

## 2023-07-26 NOTE — Telephone Encounter (Signed)
Pt's scheduled appt with Dr. Frances Furbish on 10/06/23 at 7:45 am. Pt has some new issues to address. Informed mother would a referral new issues

## 2023-07-27 ENCOUNTER — Inpatient Hospital Stay (HOSPITAL_BASED_OUTPATIENT_CLINIC_OR_DEPARTMENT_OTHER): Payer: Medicare HMO | Admitting: Hematology

## 2023-07-27 ENCOUNTER — Inpatient Hospital Stay: Payer: Medicare HMO

## 2023-07-27 ENCOUNTER — Inpatient Hospital Stay: Payer: Medicare HMO | Attending: Hematology

## 2023-07-27 VITALS — BP 120/78 | HR 91 | Temp 97.9°F | Resp 16 | Ht 70.0 in | Wt 242.2 lb

## 2023-07-27 DIAGNOSIS — C8192 Hodgkin lymphoma, unspecified, intrathoracic lymph nodes: Secondary | ICD-10-CM | POA: Diagnosis not present

## 2023-07-27 DIAGNOSIS — Z5112 Encounter for antineoplastic immunotherapy: Secondary | ICD-10-CM | POA: Insufficient documentation

## 2023-07-27 DIAGNOSIS — C8179 Other classical Hodgkin lymphoma, extranodal and solid organ sites: Secondary | ICD-10-CM | POA: Insufficient documentation

## 2023-07-27 DIAGNOSIS — Z5111 Encounter for antineoplastic chemotherapy: Secondary | ICD-10-CM | POA: Diagnosis not present

## 2023-07-27 DIAGNOSIS — Z95828 Presence of other vascular implants and grafts: Secondary | ICD-10-CM

## 2023-07-27 DIAGNOSIS — C8102 Nodular lymphocyte predominant Hodgkin lymphoma, intrathoracic lymph nodes: Secondary | ICD-10-CM

## 2023-07-27 LAB — CMP (CANCER CENTER ONLY)
ALT: 43 U/L (ref 0–44)
AST: 33 U/L (ref 15–41)
Albumin: 4.3 g/dL (ref 3.5–5.0)
Alkaline Phosphatase: 122 U/L (ref 38–126)
Anion gap: 5 (ref 5–15)
BUN: 10 mg/dL (ref 6–20)
CO2: 29 mmol/L (ref 22–32)
Calcium: 9.4 mg/dL (ref 8.9–10.3)
Chloride: 102 mmol/L (ref 98–111)
Creatinine: 0.88 mg/dL (ref 0.61–1.24)
GFR, Estimated: >60 mL/min (ref >60)
Glucose, Bld: 96 mg/dL (ref 70–99)
Potassium: 3.8 mmol/L (ref 3.5–5.1)
Sodium: 136 mmol/L (ref 135–145)
Total Bilirubin: 0.4 mg/dL (ref <1.2)
Total Protein: 7.2 g/dL (ref 6.5–8.1)

## 2023-07-27 LAB — CBC WITH DIFFERENTIAL (CANCER CENTER ONLY)
Abs Immature Granulocytes: 0 10*3/uL (ref 0.00–0.07)
Basophils Absolute: 0 10*3/uL (ref 0.0–0.1)
Basophils Relative: 1 %
Eosinophils Absolute: 0.1 10*3/uL (ref 0.0–0.5)
Eosinophils Relative: 3 %
HCT: 41 % (ref 39.0–52.0)
Hemoglobin: 14.3 g/dL (ref 13.0–17.0)
Immature Granulocytes: 0 %
Lymphocytes Relative: 37 %
Lymphs Abs: 1.3 10*3/uL (ref 0.7–4.0)
MCH: 29.5 pg (ref 26.0–34.0)
MCHC: 34.9 g/dL (ref 30.0–36.0)
MCV: 84.5 fL (ref 80.0–100.0)
Monocytes Absolute: 0.6 10*3/uL (ref 0.1–1.0)
Monocytes Relative: 18 %
Neutro Abs: 1.4 10*3/uL — ABNORMAL LOW (ref 1.7–7.7)
Neutrophils Relative %: 41 %
Platelet Count: 166 10*3/uL (ref 150–400)
RBC: 4.85 MIL/uL (ref 4.22–5.81)
RDW: 13.3 % (ref 11.5–15.5)
WBC Count: 3.5 10*3/uL — ABNORMAL LOW (ref 4.0–10.5)
nRBC: 0 % (ref 0.0–0.2)

## 2023-07-27 MED ORDER — CETIRIZINE HCL 10 MG PO TABS
10.0000 mg | ORAL_TABLET | Freq: Every day | ORAL | Status: DC
  Administered 2023-07-27: 10 mg via ORAL
  Filled 2023-07-27: qty 1

## 2023-07-27 MED ORDER — SODIUM CHLORIDE 0.9% FLUSH
10.0000 mL | INTRAVENOUS | Status: DC | PRN
Start: 2023-07-27 — End: 2023-07-27
  Administered 2023-07-27: 10 mL

## 2023-07-27 MED ORDER — SODIUM CHLORIDE 0.9 % IV SOLN
480.0000 mg | Freq: Once | INTRAVENOUS | Status: AC
  Administered 2023-07-27: 480 mg via INTRAVENOUS
  Filled 2023-07-27: qty 48

## 2023-07-27 MED ORDER — SODIUM CHLORIDE 0.9 % IV SOLN: Freq: Once | INTRAVENOUS | Status: AC

## 2023-07-27 MED ORDER — HEPARIN SOD (PORK) LOCK FLUSH 100 UNIT/ML IV SOLN
500.0000 [IU] | Freq: Once | INTRAVENOUS | Status: AC | PRN
  Administered 2023-07-27: 500 [IU]

## 2023-07-27 MED ORDER — SODIUM CHLORIDE 0.9% FLUSH
10.0000 mL | Freq: Once | INTRAVENOUS | Status: AC
  Administered 2023-07-27: 10 mL

## 2023-07-27 MED ORDER — ACETAMINOPHEN 325 MG PO TABS
650.0000 mg | ORAL_TABLET | Freq: Once | ORAL | Status: AC
  Administered 2023-07-27: 650 mg via ORAL
  Filled 2023-07-27: qty 2

## 2023-07-27 NOTE — Patient Instructions (Signed)
 [facility] - A DEPT OF [facility]  Discharge Instructions: Thank you for choosing [facility] to provide your oncology and hematology care.   If you have a lab appointment with the Cancer Center, please go directly to the Cancer Center and check in at the registration area.   Wear comfortable clothing and clothing appropriate for easy access to any Portacath or PICC line.   We strive to give you quality time with your provider. You may need to reschedule your appointment if you arrive late (15 or more minutes).  Arriving late affects you and other patients whose appointments are after yours.  Also, if you miss three or more appointments without notifying the office, you may be dismissed from the clinic at the provider's discretion.      For prescription refill requests, have your pharmacy contact our office and allow 72 hours for refills to be completed.    Today you received the following chemotherapy and/or immunotherapy agents: Opdivo.       To help prevent nausea and vomiting after your treatment, we encourage you to take your nausea medication as directed.  BELOW ARE SYMPTOMS THAT SHOULD BE REPORTED IMMEDIATELY: *FEVER GREATER THAN 100.4 F (38 C) OR HIGHER *CHILLS OR SWEATING *NAUSEA AND VOMITING THAT IS NOT CONTROLLED WITH YOUR NAUSEA MEDICATION *UNUSUAL SHORTNESS OF BREATH *UNUSUAL BRUISING OR BLEEDING *URINARY PROBLEMS (pain or burning when urinating, or frequent urination) *BOWEL PROBLEMS (unusual diarrhea, constipation, pain near the anus) TENDERNESS IN MOUTH AND THROAT WITH OR WITHOUT PRESENCE OF ULCERS (sore throat, sores in mouth, or a toothache) UNUSUAL RASH, SWELLING OR PAIN  UNUSUAL VAGINAL DISCHARGE OR ITCHING   Items with * indicate a potential emergency and should be followed up as soon as possible or go to the Emergency Department if any problems should occur.  Please show the CHEMOTHERAPY ALERT CARD or IMMUNOTHERAPY  ALERT CARD at check-in to the Emergency Department and triage nurse.  Should you have questions after your visit or need to cancel or reschedule your appointment, please contact [facility] - A DEPT OF [facility]  Dept: [phone]  and follow the prompts.  Office hours are 8:00 a.m. to 4:30 p.m. Monday - Friday. Please note that voicemails left after 4:00 p.m. may not be returned until the following business day.  We are closed weekends and major holidays. You have access to a nurse at all times for urgent questions. Please call the main number to the clinic Dept: [phone] and follow the prompts.   For any non-urgent questions, you may also contact your provider using MyChart. We now offer e-Visits for anyone 52 and older to request care online for non-urgent symptoms. For details visit [email or web address].   Also download the MyChart app! Go to the app store, search "MyChart", open the app, select [facility], and log in with your MyChart username and password.

## 2023-07-27 NOTE — Progress Notes (Signed)
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 male who has been referred to [place] by .Lazoff, Shawn P, DO and Dr. Clydell Hakim MD for evaluation and management of newly diagnosed mediastinal mass concerning for likely lymphoma.  Patient has a history of fetal alcohol syndrome with developmental delay, narcolepsy, ADD, exercise-induced asthma and lives with his adoptive parents. He recently presented to the hospital on 07/01/2022 with 2-week history of worsening shortness of breath cough and wheezing.  He received his asthma treatment but was still noted to have significant cough.  In the emergency room he had a chest x-ray which showed an mediastinal mass. Subsequent CT chest with contrast on 07/01/2022 showed extensive mediastinal and hilar lymphadenopathy with conglomerate soft tissue mass/adenopathy within the anterior mediastinum measuring 11.2 x 7.9 x 13 cm. Compression of the left brachiocephalic and SVC within the mid though these vascular structures remain patent.  Patient did not have any clinical signs or symptoms of SVC compression syndrome or left upper extremity swelling.  Patient subsequently had a CT of the abdomen and pelvis which showed no acute intra-abdominal or intrapelvic abnormalities. He underwent a CT-guided core needle biopsy of the mediastinal mass by interventional radiology.  The official pathology results from his biopsy are not currently available at the time of this clinic visit. I did call and talk to the pathologist Dr. Weatherford Callas and she notes that this either looks like a lymphoma or thymoma and she is running additional tests to make a final  diagnosis.  Patient's father accompanied him for this visit and his mother who helps make most of the decisions in tandem with his father was present on the phone.  They do not note any unexplained fevers chills night sweats or significant weight loss.  His breathing has been relatively stable since hospital discharge but he is still having some cough.  I confirmed adequacy of tissue sampling with the pathologist and the patient was then started on prednisone to try to help his cough by drinking his mediastinal tumor some and reducing bronchial inflammation.  Patient is unable to provide much information or review of systems on account of his developmental delay issues.  Interval History:   Paul [name] is a wonderful 32 y.o. male who is here for continued evaluation and management of classical hogkins lymphoma. He is here to receive cycle 5 day 1 of his treatment, only Nivolumab not ADCETRIS.  Patient was last seen by me on 06/29/2023 and he complained of bilateral leg neuropathy and he still falls asleep whenever.   Patient is accompanied by his father during this visit. He reports of one episode of nausea and vomiting yesterday. He denies any other GI symptoms with nausea.   He has started taking Modafinil 200 mg around 2-3 weeks ago, which has been keeping the patient awake and alert.   He denies any new infection issues, fever, chills, night sweats, unexplained weight loss, back pain, chest pain, abdominal pain, or leg swelling.   Patient tolerated his previous cycle of his treatment well without any new or severe toxicities.   MEDICAL HISTORY:  Past Medical History:  Diagnosis Date   ADD (attention deficit disorder)    ADHD (attention deficit  hyperactivity disorder)    Asthma    excercise induced and pollen   Auditory processing disorder    Complication of anesthesia    mother states pt. is harder to put under anes. and hard to wake up due to fetal alcohol syndrome; can be  combative, per mother; states he will do better if mother is in PACU when he is coming out of anes.   Development delay    states mental status of a 32 year old   Exercise-induced asthma    prn inhaler/neb.   Fetal alcohol syndrome    Narcolepsy    Non-restorable tooth 09/2015   teeth   Separation anxiety    Twin birth     SURGICAL HISTORY: Past Surgical History:  Procedure Laterality Date   IR IMAGING GUIDED PORT INSERTION  08/11/2022   MULTIPLE EXTRACTIONS WITH ALVEOLOPLASTY N/A 09/23/2015   Procedure: MULTIPLE EXTRACTION WITH ALVEOLOPLASTY;  Surgeon: Ocie Doyne, DDS;  Location: [facility];  Service: Oral Surgery;  Laterality: N/A;   PYLOROMYOTOMY     age [age], performed found to not have stenosis on surgical exam   PYLOROMYOTOMY     did not have pyloric stenosis; did surgery on the wrong twin   RADIOLOGY WITH ANESTHESIA N/A 01/16/2020   Procedure: MRI WITH ANESTHESIA  BRAIN WITH AND WITHOUT CONTRAST;  Surgeon: Radiologist, Medication, MD;  Location: [facility] OR;  Service: Radiology;  Laterality: N/A;    SOCIAL HISTORY: Social History   Socioeconomic History   Marital status: Single    Spouse name: Not on file   Number of children: Not on file   Years of education: Not on file   Highest education level: Not on file  Occupational History   Not on file  Tobacco Use   Smoking status: Never   Smokeless tobacco: Never  Vaping Use   Vaping status: Never Used  Substance and Sexual Activity   Alcohol use: No   Drug use: No   Sexual activity: Not on file  Other Topics Concern   Not on file  Social History Narrative   ** Merged History Encounter **       Social Determinants of Health   Financial Resource Strain: Low Risk  (04/09/2021)   Received from [facility] visits prior to 11/07/2022., [facility] visits prior to 11/07/2022.   Overall Financial Resource Strain (CARDIA)    Difficulty of Paying Living Expenses: Not  hard at all  Food Insecurity: Low Risk  (07/01/2023)   [facility]   Hunger Vital Sign    Worried About Running Out of Food in the Last Year: Never true    Ran Out of Food in the Last Year: Never true  Transportation Needs: No Transportation Needs (07/01/2023)   Received from [facility]    In the past 12 months, has lack of reliable transportation kept you from medical appointments, meetings, work or from getting things needed for daily living? : No  Physical Activity: Insufficiently Active (04/09/2021)   [facility] visits prior to 11/07/2022., [facility] visits prior to 11/07/2022.   Exercise Vital Sign    Days of Exercise per Week: 5 days    Minutes of Exercise per Session: 10 min  Stress: No Stress Concern Present (04/09/2021)   Received from [facility] visits prior to 11/07/2022., [facility] visits prior to 11/07/2022.  [employer] of Occupational Health - Occupational Stress Questionnaire    Feeling of Stress : Only a little  Social Connections: Socially Isolated (04/09/2021)   Received from [facility] visits prior to 11/07/2022., [facility] visits prior to 11/07/2022.   Social Advertising account executive [NHANES]    Frequency of Communication with Friends and Family: Never    Frequency of Social Gatherings with Friends and Family: Once a week    Attends Religious Services: Never    Database administrator or Organizations: Yes    Attends Engineer, structural: More than 4 times per year    Marital Status: Never married  Intimate Partner Violence: Not At Risk (05/13/2023)   Humiliation, Afraid, Rape, and Kick questionnaire    Fear of Current or Ex-Partner: No    Emotionally Abused: No    Physically Abused: No    Sexually Abused: No    FAMILY HISTORY: Family History  Adopted: Yes  Problem  Relation Age of Onset   Mental illness Mother    Diabetes Mother    Hypertension Mother    Schizophrenia Mother    Bipolar disorder Mother    Mental retardation Father    Sleep apnea Neg Hx     ALLERGIES:  is allergic to gabapentin and other.  MEDICATIONS:  Current Outpatient Medications  Medication Sig Dispense Refill   albuterol (PROVENTIL HFA;VENTOLIN HFA) 108 (90 BASE) MCG/ACT inhaler Inhale 2 puffs into the lungs every 6 (six) hours as needed for wheezing or shortness of breath.     albuterol (PROVENTIL) (5 MG/ML) 0.5% nebulizer solution Take 2.5 mg by nebulization every 6 (six) hours as needed for wheezing or shortness of breath.     amoxicillin-clavulanate (AUGMENTIN) 875-125 MG tablet Take 1 tablet by mouth 2 (two) times daily. 10 tablet 0   DULoxetine (CYMBALTA) 20 MG capsule TAKE 1 CAPSULE BY MOUTH DAILY 30 capsule 1   guaiFENesin (ROBITUSSIN) 100 MG/5ML liquid Take 5 mLs by mouth every 4 (four) hours as needed for cough or to loosen phlegm. 120 mL 0   ibuprofen (ADVIL) 800 MG tablet Take 800 mg by mouth every 8 (eight) hours as needed for mild pain or headache.     lidocaine-prilocaine (EMLA) cream Apply to affected area once (Patient taking differently: Apply 1 Application topically as needed (for port access).) 30 g 3   loratadine (CLARITIN) 10 MG tablet Take 10 mg by mouth daily as needed for allergies or rhinitis.     modafinil (PROVIGIL) 200 MG tablet Take 200 mg by mouth daily.     Multiple Vitamin (MULTIVITAMIN WITH MINERALS) TABS tablet Take 1 tablet by mouth daily with breakfast.     ondansetron (ZOFRAN) 4 MG tablet Take 1 tablet (4 mg total) by mouth every 6 (six) hours as needed for nausea. 20 tablet 0   prochlorperazine (COMPAZINE) 10 MG tablet Take 1 tablet (10 mg total) by mouth every 6 (six) hours as needed for nausea or vomiting. (Patient not taking: Reported on 07/13/2023) 30 tablet 1   Vitamin D, Ergocalciferol, (DRISDOL) 1.25 MG (50000 UNIT) CAPS capsule Take  50,000 Units by mouth every Monday.     No current facility-administered medications for this visit.    REVIEW OF SYSTEMS:    10 Point review of Systems was done is negative except as noted above.   PHYSICAL EXAMINATION  BP 120/78 (BP Location: Left Arm, Patient Position: Sitting)   Pulse 91   Temp  97.9 F (36.6 C) (Temporal)   Resp 16   Ht 5\' 10"  (1.778 m)   Wt 242 lb 3.2 oz (109.9 kg)   SpO2 98%   BMI 34.75 kg/m  GENERAL:alert, in no acute distress and comfortable SKIN: no acute rashes, no significant lesions EYES: conjunctiva are pink and non-injected, sclera anicteric OROPHARYNX: MMM, no exudates, no oropharyngeal erythema or ulceration NECK: supple, no JVD LYMPH:  no palpable lymphadenopathy in the cervical, axillary or inguinal regions LUNGS: clear to auscultation b/l with normal respiratory effort HEART: regular rate & rhythm ABDOMEN:  normoactive bowel sounds , non tender, not distended. No splenomegaly. Extremity: no pedal edema PSYCH: alert & oriented x 3 with fluent speech NEURO: no focal motor/sensory deficits   LABORATORY DATA:  I have reviewed the data as listed  .    Latest Ref Rng & Units 07/27/2023   12:44 PM 06/29/2023    1:14 PM 06/01/2023   12:37 PM  CBC  WBC 4.0 - 10.5 K/uL 3.5  5.2  4.0   Hemoglobin 13.0 - 17.0 g/dL 57.8  46.9  62.9   Hematocrit 39.0 - 52.0 % 41.0  39.3  38.9   Platelets 150 - 400 K/uL 166  241  220    ANC 300  .    Latest Ref Rng & Units 07/27/2023   12:44 PM 06/29/2023    1:14 PM 06/01/2023   12:37 PM  CMP  Glucose 70 - 99 mg/dL 96  96  97   BUN 6 - 20 mg/dL 10  11  16    Creatinine 0.61 - 1.24 mg/dL 5.28  4.13  2.44   Sodium 135 - 145 mmol/L 136  137  136   Potassium 3.5 - 5.1 mmol/L 3.8  3.8  3.8   Chloride 98 - 111 mmol/L 102  104  104   CO2 22 - 32 mmol/L 29  28  27    Calcium 8.9 - 10.3 mg/dL 9.4  9.3  9.1   Total Protein 6.5 - 8.1 g/dL 7.2  7.1  7.0   Total Bilirubin <1.2 mg/dL 0.4  0.3  0.3   Alkaline Phos  38 - 126 U/L 122  108  116   AST 15 - 41 U/L 33  23  22   ALT 0 - 44 U/L 43  25  27    . Lab Results  Component Value Date   LDH 171 07/03/2022   RADIOGRAPHIC STUDIES: I have personally reviewed the radiological images as listed and agreed with the findings in the report. No results found.  Surgical Pathology result from 07/03/2022: FINAL MICROSCOPIC DIAGNOSIS:  A. MEDIASTINAL MASS, ANTERIOR, NEEDLE CORE BIOPSY: -Atypical lymphoid proliferation consistent with classical Hodgkin lymphoma -See comment  COMMENT:  The sections show needle core biopsy fragments displaying a nodular and diffuse lymphoid proliferation associated with dense sclerosis.  The lymphoid process shows predominance of small lymphoid cells admixed to a lesser extent with histiocytes, eosinophils in addition to the large atypical mononuclear and occasionally lobated lymphoid appearing cells with variably prominent nucleoli.  Flow cytometric analysis was performed [facility] [phone]) and shows predominance of CD4-positive T cells. No monoclonal B-cell population identified.  In addition, a battery of immunohistochemical stains was performed including CD30, CD15, mum 1, LCA, CD20, PAX5, CD3, CD5, CD4, CD8, TdT, cytokeratin AE1/AE3, cytokeratin 8/18 and EBV in situ hybridization with appropriate controls.  The large atypical lymphoid appearing cells are positive for CD15, CD30, PAX5, mum 1 and rare cells for CD20.  No significant staining is seen with LCA, EBV, CD3, CD5, CD4, CD8.  Cytokeratin stains highlight a small focus of positivity likely representing native epithelial elements.  No significant TdT positivity is identified.  The small lymphocytes in the background show a mixture of T and B cells with predominance of T cells.  The latter show predominance of CD4 positive cells.  Overall, the features are atypical and most consistent with classical Hodgkin lymphoma which is best subclassified as  nodular sclerosis type.   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 The mass was causing some compression of his SVC and left brachiocephalic vein but no complete obstruction or symptoms related to this. No constitutional symptoms.  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  PLAN: -Discussed lab results from today, 07/27/2023, in detail with the patient. CBC shows slightly decreased WBC at 3.5 K, but stable overall. CMP pending.   -Patient only received Nivolumab treatment without ADCETRIS. He tolerated cycle 5 well without any new or severe toxicities.  -Patient can continue cycle 6 day 1 of his treatment, only Nivolumab and not ADCETRIS, during this visit. Oral Zyrtec instead of Benadryl and no steroids.  -Continue B-complex supplement.  -PET scan is scheduled on 07/29/2023.   FOLLOW-UP: Per integrated scheduling  The total time spent in the appointment was 30 minutes* .  All of the patient's questions were answered with apparent satisfaction. The patient knows to call the clinic with any problems, questions or concerns.   Wyvonnia Lora MD MS AAHIVMS [facility] Hematology/Oncology Physician [facility]  .*Total Encounter Time as defined by the Centers for Medicare and Medicaid Services includes, in addition to the face-to-face time of a patient visit (documented in the note above) non-face-to-face time: obtaining and reviewing outside history, ordering and reviewing medications, tests or procedures, care coordination (communications with other health care professionals or caregivers) and documentation in the medical record.   I,Param Shah,acting as a Neurosurgeon for  Wyvonnia Lora, MD.,have documented all relevant documentation on the behalf of Wyvonnia Lora, MD,as directed by  Wyvonnia Lora, MD while in the presence of Wyvonnia Lora, MD.  .I have reviewed the above documentation for accuracy and completeness, and I agree with the above. Johney Maine MD

## 2023-07-27 NOTE — Progress Notes (Signed)
Patient seen by Dr. Addison Naegeli are within treatment parameters. Labs reviewed: and are not all within treatment parameters.    Dr Candise Che aware ANC: 1.4, WBC: 3.5  Per physician team, patient is ready for treatment and there are NO modifications to the treatment plan.

## 2023-07-29 ENCOUNTER — Encounter (HOSPITAL_COMMUNITY)
Admission: RE | Admit: 2023-07-29 | Discharge: 2023-07-29 | Disposition: A | Discharge status: Home / Self Care | Payer: Medicare HMO | Source: Ambulatory Visit | Attending: Hematology | Admitting: Hematology

## 2023-07-29 DIAGNOSIS — C8192 Hodgkin lymphoma, unspecified, intrathoracic lymph nodes: Secondary | ICD-10-CM | POA: Insufficient documentation

## 2023-07-29 LAB — GLUCOSE, CAPILLARY: Glucose-Capillary: 84 mg/dL (ref 70–99)

## 2023-07-29 MED ORDER — FLUDEOXYGLUCOSE F - 18 (FDG) INJECTION
12.1000 | Freq: Once | INTRAVENOUS | Status: AC
  Administered 2023-07-29: 12.1 via INTRAVENOUS

## 2023-08-02 ENCOUNTER — Encounter: Payer: Self-pay | Admitting: Hematology

## 2023-08-09 ENCOUNTER — Encounter: Payer: Self-pay | Admitting: Neurology

## 2023-08-09 ENCOUNTER — Ambulatory Visit (INDEPENDENT_AMBULATORY_CARE_PROVIDER_SITE_OTHER): Payer: Medicare HMO | Admitting: Neurology

## 2023-08-09 ENCOUNTER — Encounter: Payer: Self-pay | Admitting: Hematology

## 2023-08-09 VITALS — BP 118/70 | HR 80 | Ht 69.0 in | Wt 244.0 lb

## 2023-08-09 DIAGNOSIS — R400 Somnolence: Secondary | ICD-10-CM

## 2023-08-09 DIAGNOSIS — G4733 Obstructive sleep apnea (adult) (pediatric): Secondary | ICD-10-CM | POA: Diagnosis not present

## 2023-08-09 DIAGNOSIS — Z789 Other specified health status: Secondary | ICD-10-CM | POA: Diagnosis not present

## 2023-08-09 NOTE — Patient Instructions (Signed)
Please try to use your CPAP more regularly. While your insurance requires that you use CPAP at least 4 hours each night on 70% of the nights, I recommend, that you not skip any nights and use it throughout the night if you can. Getting used to CPAP and staying with the treatment long term does take time and patience and discipline. Untreated obstructive sleep apnea when it is moderate to severe can have an adverse impact on cardiovascular health and raise her risk for heart disease, arrhythmias, hypertension, congestive heart failure, stroke and diabetes. Untreated obstructive sleep apnea causes sleep disruption, nonrestorative sleep, and sleep deprivation. This can have an impact on your day to day functioning and cause daytime sleepiness and impairment of cognitive function, memory loss, mood disturbance, and problems focussing. Using CPAP regularly can improve these symptoms.  I will ask for a mask fit appointment with [facility]. If you don't hear from them in the next 1 week, please call [facility] at [phone].

## 2023-08-09 NOTE — Progress Notes (Signed)
Subjective:    Patient ID: Paul [name] is a 32 y.o. male.  HPI    Interim history:   Paul [name] is a 32 year old male with an underlying medical history of Hodgkin's lymphoma, allergies, fetal alcohol syndrome, with developmental delays, ADHD, vitamin D deficiency, asthma, and obesity, who presents for follow-up consultation of his obstructive sleep apnea.  The patient is accompanied by his father today. He was last seen in sleep clinic by Butch Penny, NP on 09/17/2022, at which time he was not fully compliant with his CPAP of 9 cm.  He was encouraged to be consistent with treatment.  Today, 08/09/2023: I reviewed his CPAP compliance data from the past 90 days during which time he used his machine 36 out of 90 days between 04/29/2023 and 07/27/2023.  Percent use days greater than 4 hours was only 13%, indicating significantly low compliance.  Average usage for days on treatment of 3 hours and 2 minutes.  Residual AHI 2.5/h, leak on the higher side with the 95th percentile at 42.5 L/min on a pressure of 9 cm with EPR of 3.  He reports daytime sleepiness.  He does not wake up fully rested.  In the past year he used his CPAP 158 out of 365 days with percent use days greater than 4 hours of only 14%, average usage for days on treatment of only 3 hours and 16 minutes. He reports feeling sleepy during the day.  His father reports that he was started on Provigil about a month ago by his PCP.  He no longer takes Cymbalta.  Patient admits that he does not always use his CPAP, he reports that he has to frequently help his mom, he sleeps in his mom's bedroom currently, his mom has had significant medical problems and requires a lot of help, per patient's dad, she is currently bed ridden.  Patient's Epworth sleepiness score is 23 out of 24.  He has been referred to [facility] by his PCP for further memory evaluation.  He is followed closely by oncology/hematology for his Hodgkin's lymphoma.  He had an  admission to the hospital in September 2024 for sepsis.  He had a recent restaging PET scan on 07/29/2023, report is pending.   The patient's allergies, current medications, family history, past medical history, past social history, past surgical history and problem list were reviewed and updated as appropriate.    Previously:   06/11/20: (He) presents for follow-up consultation of his cognitive decline.  He is accompanied by his mother again today.  I first met him at the request of his primary care physician on 01/03/2020, at which time his mother reported a cognitive decline over the previous 18 months.  His MMSE was 21 out of 30 at the time.  He was unable to give his own history.  He had had an MRI many years ago.  I suggested further work-up in the form of brain MRI, EEG and sleep testing.  He had a brain MRI with and without contrast under anesthesia as requested by mom on 01/16/2020 and I reviewed the results: IMPRESSION: No acute intracranial abnormality, and unremarkable MRI appearance of the brain.  He had an EEG on 02/01/2020 and I reviewed the results: CONCLUSION: This is a  normal awake EEG.  There is no electrodiagnostic evidence of epileptiform discharge.   He had a baseline sleep study on 01/25/2020 which showed a sleep efficiency of 87.2%, sleep latency 1.5 minutes, REM latency was 4 minutes, REM percentage  16.9%.  He had moderate to severe obstructive sleep apnea with a total AHI of 18.3/h, REM AHI of 59.5/h, and supine AHI of 18.2/h.  Average oxygen saturation was 94%, nadir was 85%.  He was invited back for a CPAP titration study, which he had on 02/28/2020.  He was fitted with a small Simplus full facemask and CPAP was titrated from 5cm to 9 cm.  On the final pressure his AHI was 0.8/h with supine REM sleep achieved an O2 nadir of 92%.  Sleep efficiency was 98.8%, sleep latency 3 minutes, REM latency was 9.5 minutes, REM percentage normal at 22.3%.      We kept his mother up-to-date  with all his test results.   Today, 06/11/2020: I reviewed his CPAP compliance data from 05/11/2020 through 06/09/2020, which is a total of 30 days, during which time he used his CPAP 14 days with percent use days greater than 4 hours at 0%, indicating significantly low compliance with an average usage of 1 hour and 55 minutes, residual AHI 0.6/h, leak on the high side with a 95th percentile at 26 L/min on a pressure of 9 cm with EPR of 3. He reports using his CPAP.  His mom reports that he uses it every night or nearly every night.  However, it is possible that he does not put it on every night and perhaps take it off in the middle of the night as she sleeps in a different bedroom.  She reports that he seems to function better some days than others.  His memory loss is about the same.  She is currently not in favor of seeking more in-depth evaluation with a memory disorders clinic.   01/03/20: (He) has had cognitive decline over the past 18 months per mom.  The patient is minimally verbal and not able to provide any of his history.  She adopted him when he was 7.  He has a twin brother who had an abnormality on his brain.  Both the patient and his twin brother had MRIs many years ago, MRI results of any brain MRI or CT had are not available for my review today.  He has had increase in frustration, some mood irritability, no aggression.  He has been noted to become slower in his movements and less vigilant in the past 1 year.  Sometimes he has a staring spell or zones out, no convulsions.  She is not sure if he had an EEG in the past but would like to get an EEG done.  She believes that he would not be able to lay still for an MRI and would like to have it done under anesthesia but also reports that he had complications from anesthesia including apneas when he had dental surgery under anesthesia and was sent home, and stopped breathing at home.  She did not have to go to the ER with him as he started breathing on  his own. He has had sleepiness during the day and his Epworth sleepiness score is high at 22 out of 24, fatigue severity score is 63 out of 63. He snores. I reviewed your office note from 12/26/2019.  He had a sleep study about 4 years ago which was negative for sleep apnea at the time.  I reviewed blood test results which were available through your records: TSH was normal, free T4 normal, B12 385, lipid panel showed LDL of 81, total cholesterol 122, triglycerides 78, HDL 30, vitamin D was low at 12, CMP  showed BUN of 11, creatinine of 1.8, alkaline phosphatase elevated at 153, liver enzymes otherwise normal, CBC with differential was unremarkable. He is able to do his own ADLs but takes longer.  It takes him 4 hours to do the dishes.  He plays computer games but seems to have become less quick with games that he has been able to do well before.   His Past Medical History Is Significant For: Past Medical History:  Diagnosis Date   ADD (attention deficit disorder)    ADHD (attention deficit hyperactivity disorder)    Asthma    excercise induced and pollen   Auditory processing disorder    Complication of anesthesia    mother states pt. is harder to put under anes. and hard to wake up due to fetal alcohol syndrome; can be combative, per mother; states he will do better if mother is in PACU when he is coming out of anes.   Development delay    states mental status of a 32 year old   Exercise-induced asthma    prn inhaler/neb.   Fetal alcohol syndrome    Narcolepsy    Non-restorable tooth 09/2015   teeth   Separation anxiety    Twin birth     His Past Surgical History Is Significant For: Past Surgical History:  Procedure Laterality Date   IR IMAGING GUIDED PORT INSERTION  08/11/2022   MULTIPLE EXTRACTIONS WITH ALVEOLOPLASTY N/A 09/23/2015   Procedure: MULTIPLE EXTRACTION WITH ALVEOLOPLASTY;  Surgeon: Ocie Doyne, DDS;  Location: [facility];  Service: Oral Surgery;   Laterality: N/A;   PYLOROMYOTOMY     age [age], performed found to not have stenosis on surgical exam   PYLOROMYOTOMY     did not have pyloric stenosis; did surgery on the wrong twin   RADIOLOGY WITH ANESTHESIA N/A 01/16/2020   Procedure: MRI WITH ANESTHESIA  BRAIN WITH AND WITHOUT CONTRAST;  Surgeon: Radiologist, Medication, MD;  Location: [facility] OR;  Service: Radiology;  Laterality: N/A;    His Family History Is Significant For: Family History  Adopted: Yes  Problem Relation Age of Onset   Mental illness Mother    Diabetes Mother    Hypertension Mother    Schizophrenia Mother    Bipolar disorder Mother    Mental retardation Father    Sleep apnea Neg Hx     His Social History Is Significant For: Social History   Socioeconomic History   Marital status: Single    Spouse name: Not on file   Number of children: Not on file   Years of education: Not on file   Highest education level: Not on file  Occupational History   Not on file  Tobacco Use   Smoking status: Never   Smokeless tobacco: Never  Vaping Use   Vaping status: Never Used  Substance and Sexual Activity   Alcohol use: No   Drug use: No   Sexual activity: Not on file  Other Topics Concern   Not on file  Social History Narrative   ** Merged History Encounter **       Social Determinants of Health   Financial Resource Strain: Low Risk  (04/09/2021)   [facility] from [facility] prior to 11/07/2022., [facility] visits prior to 11/07/2022.   Overall Financial Resource Strain (CARDIA)    Difficulty of Paying Living Expenses: Not hard at all  Food Insecurity: Low Risk  (07/01/2023)   [facility]   Hunger  Vital Sign    Worried About Programme researcher, broadcasting/film/video in the Last Year: Never true    Ran Out of Food in the Last Year: Never true  Transportation Needs: No Transportation Needs (07/01/2023)   Received from [employer]    In the past 12  months, has lack of reliable transportation kept you from medical appointments, meetings, work or from getting things needed for daily living? : No  Physical Activity: Insufficiently Active (04/09/2021)   Received from [facility] visits prior to 11/07/2022., [facility] visits prior to 11/07/2022.   Exercise Vital Sign    Days of Exercise per Week: 5 days    Minutes of Exercise per Session: 10 min  Stress: No Stress Concern Present (04/09/2021)   [facility] from [facility] visits prior to 11/07/2022., [facility] visits prior to 11/07/2022.   [employer] of Occupational Health - Occupational Stress Questionnaire    Feeling of Stress : Only a little  Social Connections: Socially Isolated (04/09/2021)   Received from [facility] visits prior to 11/07/2022., [facility] visits prior to 11/07/2022.   Social Advertising account executive [NHANES]    Frequency of Communication with Friends and Family: Never    Frequency of Social Gatherings with Friends and Family: Once a week    Attends Religious Services: Never    Database administrator or Organizations: Yes    Attends Engineer, structural: More than 4 times per year    Marital Status: Never married    His Allergies Are:  Allergies  Allergen Reactions   Gabapentin     MAKES HIM GO INTO A DEEP SLEEP   Other Other (See Comments)    Fetal alcohol syndrome  Certain (unnamed) medications have adverse effects on him. Must have heavy anesthesia meds in order to work.  :   His Current Medications Are:  Outpatient Encounter Medications as of 08/09/2023  Medication Sig   albuterol (PROVENTIL HFA;VENTOLIN HFA) 108 (90 BASE) MCG/ACT inhaler Inhale 2 puffs into the lungs every 6 (six) hours as needed for wheezing or shortness of breath.   albuterol (PROVENTIL) (5 MG/ML) 0.5% nebulizer solution Take 2.5 mg by nebulization  every 6 (six) hours as needed for wheezing or shortness of breath.   guaiFENesin (ROBITUSSIN) 100 MG/5ML liquid Take 5 mLs by mouth every 4 (four) hours as needed for cough or to loosen phlegm.   ibuprofen (ADVIL) 800 MG tablet Take 800 mg by mouth every 8 (eight) hours as needed for mild pain or headache.   modafinil (PROVIGIL) 200 MG tablet Take 200 mg by mouth daily.   Multiple Vitamin (MULTIVITAMIN WITH MINERALS) TABS tablet Take 1 tablet by mouth daily with breakfast.   ondansetron (ZOFRAN) 4 MG tablet Take 1 tablet (4 mg total) by mouth every 6 (six) hours as needed for nausea.   prochlorperazine (COMPAZINE) 10 MG tablet Take 1 tablet (10 mg total) by mouth every 6 (six) hours as needed for nausea or vomiting.   Vitamin D, Ergocalciferol, (DRISDOL) 1.25 MG (50000 UNIT) CAPS capsule Take 50,000 Units by mouth every Monday.   [DISCONTINUED] amoxicillin-clavulanate (AUGMENTIN) 875-125 MG tablet Take 1 tablet by mouth 2 (two) times daily.   [DISCONTINUED] DULoxetine (CYMBALTA) 20 MG capsule TAKE 1 CAPSULE BY MOUTH DAILY   [DISCONTINUED] lidocaine-prilocaine (EMLA) cream Apply to affected area once (Patient taking differently: Apply 1 Application  topically as needed (for port access).)   No facility-administered encounter medications on file as of 08/09/2023.  :  Review of Systems:  Out of a complete 14 point review of systems, all are reviewed and negative with the exception of these symptoms as listed below:  Review of Systems  Neurological:        Pt is here for follow up on OSA/CPAP. States he falls asleep during the day, doesn't matter if he slept 7-9 hours during the night. States stays fatigued all the time. Denies headaches.  ESS 23    Objective:  Neurological Exam  Physical Exam Physical Examination:   Vitals:   08/09/23 1305  BP: 118/70  Pulse: 80    General Examination: The patient is a very pleasant 32 y.o. male in no acute distress. He appears well-developed and  well-nourished and well groomed.   HEENT: Normocephalic, atraumatic, pupils are equal, round and reactive to light, extraocular tracking is mildly impaired, face is symmetric, speech is very scant and slightly slow with mild impediment but does not seem to be any different from before, no actual dysarthria, no hypophonia, no voice tremor.  Corrective eyeglasses in place.  Airway examination reveals moderate mouth dryness and otherwise stable findings.  Tongue protrudes centrally and palate elevates symmetrically, no carotid bruits.  Patient has a beard.     Chest: Clear to auscultation without wheezing, rhonchi or crackles noted.   Heart: S1+S2+0, regular and normal without murmurs, rubs or gallops noted.    Abdomen: Soft, non-tender and non-distended with normal bowel sounds appreciated on auscultation.   Extremities: There is mild swelling noted in the distal lower extremities bilaterally.    Skin: Warm and dry without trophic changes noted.   Musculoskeletal: exam reveals no obvious joint deformities.    Neurologically:  Mental status: The patient is awake, alert and pays attention.  He is able to follow simple commands, he is minimally verbal.  His history is supplemented by his father.      Cranial nerves II - XII are as described above under HEENT exam.  Motor exam: Normal bulk, strength and tone is noted. Some slowness in his movements. There is no tremor. Fine motor skills and coordination: Grossly intact. Cerebellar testing: No dysmetria or intention tremor. There is no truncal or gait ataxia.  Sensory exam: intact to light touch in the upper and lower extremities.  Gait, station and balance: He stands up slowly and walks slowly, no shuffling, preserved arm swing is noted.    Assessment and Plan:  In summary, NICKY KJELLBERG is a 32 year old male with an underlying medical history of Hodgkin's lymphoma, allergies, fetal alcohol syndrome, with developmental delays, ADHD, vitamin D  deficiency, asthma, and obesity, who presents for follow-up consultation of his obstructive sleep apnea.  He has been on CPAP of 9 cm.  He is not fully compliant with treatment.  He is encouraged to be consistent with therapy, pressure settings are adequate for apnea control but he does have a higher leak from the mask.  I suggested we seek a mask fit appointment through his DME provider.  I placed an order for this.  If he does not hear from his DME provider within the next week, he is advised to call them directly.  He is encouraged to put the CPAP on every night and try to keep it on consistently.   He is advised to follow-up in sleep clinic to see one of our nurse practitioners routinely in 1  year.  I answered all their questions today and the patient and his father were in agreement.   I spent 30 minutes in total face-to-face time and in reviewing records during pre-charting, more than 50% of which was spent in counseling and coordination of care, reviewing test results, reviewing medications and treatment regimen and/or in discussing or reviewing the diagnosis of OSA, the prognosis and treatment options. Pertinent laboratory and imaging test results that were available during this visit with the patient were reviewed by me and considered in my medical decision making (see chart for details).

## 2023-08-10 ENCOUNTER — Other Ambulatory Visit: Payer: Self-pay

## 2023-08-11 ENCOUNTER — Telehealth: Payer: Self-pay | Admitting: Anesthesiology

## 2023-08-11 NOTE — Telephone Encounter (Signed)
Cpap order was sent to [facility].  Confirmation of order received on 08/11/23.

## 2023-08-17 ENCOUNTER — Ambulatory Visit (HOSPITAL_COMMUNITY): Payer: Medicare HMO | Attending: Cardiology

## 2023-08-17 DIAGNOSIS — R0789 Other chest pain: Secondary | ICD-10-CM

## 2023-08-17 LAB — ECHOCARDIOGRAM COMPLETE
Area-P 1/2: 2.97 cm2
S' Lateral: 3.2 cm

## 2023-08-24 ENCOUNTER — Inpatient Hospital Stay (HOSPITAL_BASED_OUTPATIENT_CLINIC_OR_DEPARTMENT_OTHER): Payer: Medicare HMO | Admitting: Hematology

## 2023-08-24 ENCOUNTER — Inpatient Hospital Stay: Payer: Medicare HMO | Attending: Hematology

## 2023-08-24 ENCOUNTER — Inpatient Hospital Stay: Payer: Medicare HMO

## 2023-08-24 ENCOUNTER — Encounter: Payer: Self-pay | Admitting: Hematology

## 2023-08-24 VITALS — BP 116/78 | HR 80 | Temp 98.1°F | Resp 18 | Wt 193.6 lb

## 2023-08-24 DIAGNOSIS — Z79620 Long term (current) use of immunosuppressive biologic: Secondary | ICD-10-CM | POA: Insufficient documentation

## 2023-08-24 DIAGNOSIS — C8102 Nodular lymphocyte predominant Hodgkin lymphoma, intrathoracic lymph nodes: Secondary | ICD-10-CM

## 2023-08-24 DIAGNOSIS — Z5111 Encounter for antineoplastic chemotherapy: Secondary | ICD-10-CM

## 2023-08-24 DIAGNOSIS — C8192 Hodgkin lymphoma, unspecified, intrathoracic lymph nodes: Secondary | ICD-10-CM

## 2023-08-24 DIAGNOSIS — C8179 Other classical Hodgkin lymphoma, extranodal and solid organ sites: Secondary | ICD-10-CM | POA: Insufficient documentation

## 2023-08-24 DIAGNOSIS — Z5112 Encounter for antineoplastic immunotherapy: Secondary | ICD-10-CM | POA: Diagnosis present

## 2023-08-24 DIAGNOSIS — Z95828 Presence of other vascular implants and grafts: Secondary | ICD-10-CM

## 2023-08-24 LAB — CBC WITH DIFFERENTIAL (CANCER CENTER ONLY)
Abs Immature Granulocytes: 0.01 10*3/uL (ref 0.00–0.07)
Basophils Absolute: 0 10*3/uL (ref 0.0–0.1)
Basophils Relative: 0 %
Eosinophils Absolute: 0.3 10*3/uL (ref 0.0–0.5)
Eosinophils Relative: 5 %
HCT: 40 % (ref 39.0–52.0)
Hemoglobin: 13.7 g/dL (ref 13.0–17.0)
Immature Granulocytes: 0 %
Lymphocytes Relative: 31 %
Lymphs Abs: 1.6 10*3/uL (ref 0.7–4.0)
MCH: 29.6 pg (ref 26.0–34.0)
MCHC: 34.3 g/dL (ref 30.0–36.0)
MCV: 86.4 fL (ref 80.0–100.0)
Monocytes Absolute: 0.6 10*3/uL (ref 0.1–1.0)
Monocytes Relative: 11 %
Neutro Abs: 2.8 10*3/uL (ref 1.7–7.7)
Neutrophils Relative %: 53 %
Platelet Count: 210 10*3/uL (ref 150–400)
RBC: 4.63 MIL/uL (ref 4.22–5.81)
RDW: 13.2 % (ref 11.5–15.5)
WBC Count: 5.3 10*3/uL (ref 4.0–10.5)
nRBC: 0 % (ref 0.0–0.2)

## 2023-08-24 LAB — CMP (CANCER CENTER ONLY)
ALT: 30 U/L (ref 0–44)
AST: 24 U/L (ref 15–41)
Albumin: 4.3 g/dL (ref 3.5–5.0)
Alkaline Phosphatase: 124 U/L (ref 38–126)
Anion gap: 5 (ref 5–15)
BUN: 14 mg/dL (ref 6–20)
CO2: 30 mmol/L (ref 22–32)
Calcium: 9.4 mg/dL (ref 8.9–10.3)
Chloride: 103 mmol/L (ref 98–111)
Creatinine: 0.82 mg/dL (ref 0.61–1.24)
GFR, Estimated: >60 mL/min (ref >60)
Glucose, Bld: 100 mg/dL — ABNORMAL HIGH (ref 70–99)
Potassium: 3.7 mmol/L (ref 3.5–5.1)
Sodium: 138 mmol/L (ref 135–145)
Total Bilirubin: 0.4 mg/dL (ref <1.2)
Total Protein: 6.8 g/dL (ref 6.5–8.1)

## 2023-08-24 MED ORDER — ACETAMINOPHEN 325 MG PO TABS
650.0000 mg | ORAL_TABLET | Freq: Once | ORAL | Status: AC
  Administered 2023-08-24: 650 mg via ORAL
  Filled 2023-08-24: qty 2

## 2023-08-24 MED ORDER — SODIUM CHLORIDE 0.9% FLUSH
10.0000 mL | Freq: Once | INTRAVENOUS | Status: AC
  Administered 2023-08-24: 10 mL

## 2023-08-24 MED ORDER — HEPARIN SOD (PORK) LOCK FLUSH 100 UNIT/ML IV SOLN
500.0000 [IU] | Freq: Once | INTRAVENOUS | Status: AC | PRN
Start: 2023-08-24 — End: 2023-08-24
  Administered 2023-08-24: 500 [IU]

## 2023-08-24 MED ORDER — SODIUM CHLORIDE 0.9% FLUSH
10.0000 mL | INTRAVENOUS | Status: DC | PRN
Start: 2023-08-24 — End: 2023-08-24
  Administered 2023-08-24: 10 mL

## 2023-08-24 MED ORDER — SODIUM CHLORIDE 0.9 % IV SOLN: Freq: Once | INTRAVENOUS | Status: AC

## 2023-08-24 MED ORDER — CETIRIZINE HCL 10 MG PO TABS
10.0000 mg | ORAL_TABLET | Freq: Once | ORAL | Status: AC
Start: 2023-08-24 — End: 2023-08-24
  Administered 2023-08-24: 10 mg via ORAL
  Filled 2023-08-24: qty 1

## 2023-08-24 MED ORDER — SODIUM CHLORIDE 0.9 % IV SOLN
480.0000 mg | Freq: Once | INTRAVENOUS | Status: AC
  Administered 2023-08-24: 480 mg via INTRAVENOUS
  Filled 2023-08-24: qty 48

## 2023-08-24 NOTE — Progress Notes (Signed)
Patient seen by Dr. Kale  Vitals are within treatment parameters.  Labs reviewed: and are within treatment parameters.  Per physician team, patient is ready for treatment and there are NO modifications to the treatment plan.  

## 2023-08-24 NOTE — Patient Instructions (Signed)
 [facility] - A DEPT OF [facility]  Discharge Instructions: Thank you for choosing [facility] to provide your oncology and hematology care.   If you have a lab appointment with the Cancer Center, please go directly to the Cancer Center and check in at the registration area.   Wear comfortable clothing and clothing appropriate for easy access to any Portacath or PICC line.   We strive to give you quality time with your provider. You may need to reschedule your appointment if you arrive late (15 or more minutes).  Arriving late affects you and other patients whose appointments are after yours.  Also, if you miss three or more appointments without notifying the office, you may be dismissed from the clinic at the provider's discretion.      For prescription refill requests, have your pharmacy contact our office and allow 72 hours for refills to be completed.    Today you received the following chemotherapy and/or immunotherapy agents: Opdivo.       To help prevent nausea and vomiting after your treatment, we encourage you to take your nausea medication as directed.  BELOW ARE SYMPTOMS THAT SHOULD BE REPORTED IMMEDIATELY: *FEVER GREATER THAN 100.4 F (38 C) OR HIGHER *CHILLS OR SWEATING *NAUSEA AND VOMITING THAT IS NOT CONTROLLED WITH YOUR NAUSEA MEDICATION *UNUSUAL SHORTNESS OF BREATH *UNUSUAL BRUISING OR BLEEDING *URINARY PROBLEMS (pain or burning when urinating, or frequent urination) *BOWEL PROBLEMS (unusual diarrhea, constipation, pain near the anus) TENDERNESS IN MOUTH AND THROAT WITH OR WITHOUT PRESENCE OF ULCERS (sore throat, sores in mouth, or a toothache) UNUSUAL RASH, SWELLING OR PAIN  UNUSUAL VAGINAL DISCHARGE OR ITCHING   Items with * indicate a potential emergency and should be followed up as soon as possible or go to the Emergency Department if any problems should occur.  Please show the CHEMOTHERAPY ALERT CARD or IMMUNOTHERAPY  ALERT CARD at check-in to the Emergency Department and triage nurse.  Should you have questions after your visit or need to cancel or reschedule your appointment, please contact [facility] - A DEPT OF [facility]  Dept: [phone]  and follow the prompts.  Office hours are 8:00 a.m. to 4:30 p.m. Monday - Friday. Please note that voicemails left after 4:00 p.m. may not be returned until the following business day.  We are closed weekends and major holidays. You have access to a nurse at all times for urgent questions. Please call the main number to the clinic Dept: [phone] and follow the prompts.   For any non-urgent questions, you may also contact your provider using MyChart. We now offer e-Visits for anyone 45 and older to request care online for non-urgent symptoms. For details visit [email or web address].   Also download the MyChart app! Go to the app store, search "MyChart", open the app, select [place], and log in with your MyChart username and password.

## 2023-08-24 NOTE — Progress Notes (Signed)
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 male who has been referred to [place] by .Lazoff, Shawn P, DO and Dr. Clydell Hakim MD for evaluation and management of newly diagnosed mediastinal mass concerning for likely lymphoma.  Patient has a history of fetal alcohol syndrome with developmental delay, narcolepsy, ADD, exercise-induced asthma and lives with his adoptive parents. He recently presented to the hospital on 07/01/2022 with 2-week history of worsening shortness of breath cough and wheezing.  He received his asthma treatment but was still noted to have significant cough.  In the emergency room he had a chest x-ray which showed an mediastinal mass. Subsequent CT chest with contrast on 07/01/2022 showed extensive mediastinal and hilar lymphadenopathy with conglomerate soft tissue mass/adenopathy within the anterior mediastinum measuring 11.2 x 7.9 x 13 cm. Compression of the left brachiocephalic and SVC within the mid though these vascular structures remain patent.  Patient did not have any clinical signs or symptoms of SVC compression syndrome or left upper extremity swelling.  Patient subsequently had a CT of the abdomen and pelvis which showed no acute intra-abdominal or intrapelvic abnormalities. He underwent a CT-guided core needle biopsy of the mediastinal mass by interventional radiology.  The official pathology results from his biopsy are not currently available at the time of this clinic visit. I did call and talk to the pathologist Dr. Gays Callas and she notes that this either looks like a lymphoma or thymoma and she is running additional tests to make a final  diagnosis.  Patient's father accompanied him for this visit and his mother who helps make most of the decisions in tandem with his father was present on the phone.  They do not note any unexplained fevers chills night sweats or significant weight loss.  His breathing has been relatively stable since hospital discharge but he is still having some cough.  I confirmed adequacy of tissue sampling with the pathologist and the patient was then started on prednisone to try to help his cough by drinking his mediastinal tumor some and reducing bronchial inflammation.  Patient is unable to provide much information or review of systems on account of his developmental delay issues.  Interval History:   Paul [name] is a wonderful 32 y.o. male who is here for continued evaluation and management of classical hogkins lymphoma. He is here to receive cycle 7 day 1 of his treatment, only Nivolumab not ADCETRIS.  Patient was last seen by me on 07/27/2023 and he complained of nausea, but was doing well overall.   Patient is accompanied by his father and his mother is on the phone during this visit. Patient notes he has been doing well overall since our last visit. He denies any new infection issues, fever, chills, night sweats, unexpected weight loss, back pain, chest pain, or leg swelling.   He does complain of mild skin changes/rashes on bilateral hand. Denies any skin rashes throughput his body.   Patient's mother notes that the patient still falls asleep throughout the day while he is doing some activity. She also reports that the patient is taking longer to do daily activities than usual.   Patient notes he tolerated his previous treatment well without any new or  severe toxicities.   MEDICAL HISTORY:  Past Medical History:  Diagnosis Date   ADD (attention deficit disorder)    ADHD (attention deficit hyperactivity disorder)    Asthma    excercise induced and pollen   Auditory processing disorder     Complication of anesthesia    mother states pt. is harder to put under anes. and hard to wake up due to fetal alcohol syndrome; can be combative, per mother; states he will do better if mother is in PACU when he is coming out of anes.   Development delay    states mental status of a 32 year old   Exercise-induced asthma    prn inhaler/neb.   Fetal alcohol syndrome    Narcolepsy    Non-restorable tooth 09/2015   teeth   Separation anxiety    Twin birth     SURGICAL HISTORY: Past Surgical History:  Procedure Laterality Date   IR IMAGING GUIDED PORT INSERTION  08/11/2022   MULTIPLE EXTRACTIONS WITH ALVEOLOPLASTY N/A 09/23/2015   Procedure: MULTIPLE EXTRACTION WITH ALVEOLOPLASTY;  Surgeon: Ocie Doyne, DDS;  Location: [facility];  Service: Oral Surgery;  Laterality: N/A;   PYLOROMYOTOMY     age [age], performed found to not have stenosis on surgical exam   PYLOROMYOTOMY     did not have pyloric stenosis; did surgery on the wrong twin   RADIOLOGY WITH ANESTHESIA N/A 01/16/2020   Procedure: MRI WITH ANESTHESIA  BRAIN WITH AND WITHOUT CONTRAST;  Surgeon: Radiologist, Medication, MD;  Location: [facility] OR;  Service: Radiology;  Laterality: N/A;    SOCIAL HISTORY: Social History   Socioeconomic History   Marital status: Single    Spouse name: Not on file   Number of children: Not on file   Years of education: Not on file   Highest education level: Not on file  Occupational History   Not on file  Tobacco Use   Smoking status: Never   Smokeless tobacco: Never  Vaping Use   Vaping status: Never Used  Substance and Sexual Activity   Alcohol use: No   Drug use: No   Sexual activity: Not on file  Other Topics Concern   Not on file  Social History Narrative   ** Merged History Encounter **       Social Drivers of Health   Financial Resource Strain: Low Risk  (04/09/2021)   Received from [facility] visits prior to 11/07/2022., [facility]  [facility] visits prior to 11/07/2022.   Overall Financial Resource Strain (CARDIA)    Difficulty of Paying Living Expenses: Not hard at all  Food Insecurity: Low Risk  (07/01/2023)   [facility]   Hunger Vital Sign    Worried About Running Out of Food in the Last Year: Never true    Ran Out of Food in the Last Year: Never true  Transportation Needs: No Transportation Needs (07/01/2023)   Received from [facility]    In the past 12 months, has lack of reliable transportation kept you from medical appointments, meetings, work or from getting things needed for daily living? : No  Physical Activity: Insufficiently Active (04/09/2021)   Received from [facility] visits prior to 11/07/2022., [facility] visits prior to 11/07/2022.   Exercise Vital Sign    Days of Exercise per Week: 5 days    Minutes of Exercise per Session: 10 min  Stress: No Stress Concern Present (  04/09/2021)   [facility] visits prior to 11/07/2022., [facility] visits prior to 11/07/2022.   [employer] of Occupational Health - Occupational Stress Questionnaire    Feeling of Stress : Only a little  Social Connections: Socially Isolated (04/09/2021)   Received from [facility] visits prior to 11/07/2022., [facility] visits prior to 11/07/2022.   Social Advertising account executive [NHANES]    Frequency of Communication with Friends and Family: Never    Frequency of Social Gatherings with Friends and Family: Once a week    Attends Religious Services: Never    Database administrator or Organizations: Yes    Attends Engineer, structural: More than 4 times per year    Marital Status: Never married  Intimate Partner Violence: Not At Risk (05/13/2023)   Humiliation, Afraid, Rape, and Kick questionnaire    Fear of Current or Ex-Partner: No     Emotionally Abused: No    Physically Abused: No    Sexually Abused: No    FAMILY HISTORY: Family History  Adopted: Yes  Problem Relation Age of Onset   Mental illness Mother    Diabetes Mother    Hypertension Mother    Schizophrenia Mother    Bipolar disorder Mother    Mental retardation Father    Sleep apnea Neg Hx     ALLERGIES:  is allergic to gabapentin and other.  MEDICATIONS:  Current Outpatient Medications  Medication Sig Dispense Refill   albuterol (PROVENTIL HFA;VENTOLIN HFA) 108 (90 BASE) MCG/ACT inhaler Inhale 2 puffs into the lungs every 6 (six) hours as needed for wheezing or shortness of breath.     albuterol (PROVENTIL) (5 MG/ML) 0.5% nebulizer solution Take 2.5 mg by nebulization every 6 (six) hours as needed for wheezing or shortness of breath.     guaiFENesin (ROBITUSSIN) 100 MG/5ML liquid Take 5 mLs by mouth every 4 (four) hours as needed for cough or to loosen phlegm. 120 mL 0   ibuprofen (ADVIL) 800 MG tablet Take 800 mg by mouth every 8 (eight) hours as needed for mild pain or headache.     modafinil (PROVIGIL) 200 MG tablet Take 200 mg by mouth daily.     Multiple Vitamin (MULTIVITAMIN WITH MINERALS) TABS tablet Take 1 tablet by mouth daily with breakfast.     ondansetron (ZOFRAN) 4 MG tablet Take 1 tablet (4 mg total) by mouth every 6 (six) hours as needed for nausea. 20 tablet 0   prochlorperazine (COMPAZINE) 10 MG tablet Take 1 tablet (10 mg total) by mouth every 6 (six) hours as needed for nausea or vomiting. 30 tablet 1   Vitamin D, Ergocalciferol, (DRISDOL) 1.25 MG (50000 UNIT) CAPS capsule Take 50,000 Units by mouth every Monday.     No current facility-administered medications for this visit.    REVIEW OF SYSTEMS:    10 Point review of Systems was done is negative except as noted above.   PHYSICAL EXAMINATION  BP 116/78 (BP Location: Left Arm, Patient Position: Sitting)   Pulse 80   Temp 98.1 F (36.7 C) (Temporal)   Resp 18   Wt 193 lb  9.6 oz (87.8 kg)   SpO2 98%   BMI 28.59 kg/m  GENERAL:alert, in no acute distress and comfortable SKIN: no acute rashes, no significant lesions EYES: conjunctiva are pink and non-injected, sclera anicteric OROPHARYNX: MMM, no exudates, no oropharyngeal erythema or ulceration NECK: supple, no  JVD LYMPH:  no palpable lymphadenopathy in the cervical, axillary or inguinal regions LUNGS: clear to auscultation b/l with normal respiratory effort HEART: regular rate & rhythm ABDOMEN:  normoactive bowel sounds , non tender, not distended. No splenomegaly. Extremity: no pedal edema PSYCH: alert & oriented x 3 with fluent speech NEURO: no focal motor/sensory deficits   LABORATORY DATA:  I have reviewed the data as listed  .    Latest Ref Rng & Units 08/24/2023    1:13 PM 07/27/2023   12:44 PM 06/29/2023    1:14 PM  CBC  WBC 4.0 - 10.5 K/uL 5.3  3.5  5.2   Hemoglobin 13.0 - 17.0 g/dL 40.9  81.1  91.4   Hematocrit 39.0 - 52.0 % 40.0  41.0  39.3   Platelets 150 - 400 K/uL 210  166  241    .    Latest Ref Rng & Units 08/24/2023    1:13 PM 07/27/2023   12:44 PM 06/29/2023    1:14 PM  CMP  Glucose 70 - 99 mg/dL 782  96  96   BUN 6 - 20 mg/dL 14  10  11    Creatinine 0.61 - 1.24 mg/dL 9.56  2.13  0.86   Sodium 135 - 145 mmol/L 138  136  137   Potassium 3.5 - 5.1 mmol/L 3.7  3.8  3.8   Chloride 98 - 111 mmol/L 103  102  104   CO2 22 - 32 mmol/L 30  29  28    Calcium 8.9 - 10.3 mg/dL 9.4  9.4  9.3   Total Protein 6.5 - 8.1 g/dL 6.8  7.2  7.1   Total Bilirubin <1.2 mg/dL 0.4  0.4  0.3   Alkaline Phos 38 - 126 U/L 124  122  108   AST 15 - 41 U/L 24  33  23   ALT 0 - 44 U/L 30  43  25    . Lab Results  Component Value Date   LDH 171 07/03/2022   RADIOGRAPHIC STUDIES: I have personally reviewed the radiological images as listed and agreed with the findings in the report. ECHOCARDIOGRAM COMPLETE Result Date: 08/17/2023    ECHOCARDIOGRAM REPORT   Patient Name:   BRENTTON CHRISCO Date  of Exam: 08/17/2023 Medical Rec #:  [id number]       Height:       69.0 in Accession #:    [id number]      Weight:       244.0 lb Date of Birth:  [birth year]       BSA:          2.248 m Patient Age:    32 years        BP:           118/70 mmHg Patient Gender: M               HR:           66 bpm. Exam Location:  [address] Procedure: 2D Echo, Cardiac Doppler and Color Doppler Indications:    R07.89 Chest pain  History:        Patient has prior history of Echocardiogram examinations, most                 recent 07/13/2022. Signs/Symptoms:Chest Pain; Risk Factors:Morbid                 obesity.  Sonographer:    Samule Ohm RDCS Referring Phys: [id number] [facility]  TOBB IMPRESSIONS  1. Left ventricular ejection fraction, by estimation, is 60 to 65%. The left ventricle has normal function. The left ventricle has no regional wall motion abnormalities. Left ventricular diastolic parameters were normal.  2. Right ventricular systolic function is normal. The right ventricular size is normal. Tricuspid regurgitation signal is inadequate for assessing PA pressure.  3. The mitral valve is normal in structure. No evidence of mitral valve regurgitation. No evidence of mitral stenosis.  4. The aortic valve is grossly normal. Aortic valve regurgitation is not visualized. No aortic stenosis is present.  5. The inferior vena cava is normal in size with greater than 50% respiratory variability, suggesting right atrial pressure of 3 mmHg. Comparison(s): No prior Echocardiogram. FINDINGS  Left Ventricle: Left ventricular ejection fraction, by estimation, is 60 to 65%. The left ventricle has normal function. The left ventricle has no regional wall motion abnormalities. The left ventricular internal cavity size was normal in size. There is  no left ventricular hypertrophy. Left ventricular diastolic parameters were normal. Right Ventricle: The right ventricular size is normal. No increase in right ventricular wall thickness. Right  ventricular systolic function is normal. Tricuspid regurgitation signal is inadequate for assessing PA pressure. Left Atrium: Left atrial size was normal in size. Right [facility]: Right atrial size was normal in size. Pericardium: There is no evidence of pericardial effusion. Mitral Valve: The mitral valve is normal in structure. No evidence of mitral valve regurgitation. No evidence of mitral valve stenosis. Tricuspid Valve: The tricuspid valve is normal in structure. Tricuspid valve regurgitation is not demonstrated. No evidence of tricuspid stenosis. Aortic Valve: The aortic valve is grossly normal. Aortic valve regurgitation is not visualized. No aortic stenosis is present. Pulmonic Valve: The pulmonic valve was normal in structure. Pulmonic valve regurgitation is trivial. No evidence of pulmonic stenosis. Aorta: The aortic root and ascending aorta are structurally normal, with no evidence of dilitation. Venous: The inferior vena cava is normal in size with greater than 50% respiratory variability, suggesting right atrial pressure of 3 mmHg. IAS/Shunts: The atrial septum is grossly normal.  LEFT VENTRICLE PLAX 2D LVIDd:         4.70 cm   Diastology LVIDs:         3.20 cm   LV e' medial:    6.96 cm/s LV PW:         0.90 cm   LV E/e' medial:  11.9 LV IVS:        1.00 cm   LV e' lateral:   12.60 cm/s LVOT diam:     2.20 cm   LV E/e' lateral: 6.6 LV SV:         73 LV SV Index:   32 LVOT Area:     3.80 cm  RIGHT VENTRICLE             IVC RV S prime:     13.20 cm/s  IVC diam: 0.90 cm TAPSE (M-mode): 1.5 cm LEFT ATRIUM             Index        RIGHT ATRIUM           Index LA diam:        3.40 cm 1.51 cm/m   RA Pressure: 3.00 mmHg LA Vol (A2C):   46.7 ml 20.77 ml/m  RA Area:     13.20 cm LA Vol (A4C):   37.4 ml 16.64 ml/m  RA Volume:   34.60 ml  15.39 ml/m LA  Biplane Vol: 41.9 ml 18.64 ml/m  AORTIC VALVE LVOT Vmax:   92.60 cm/s LVOT Vmean:  61.400 cm/s LVOT VTI:    0.192 m  AORTA Ao Root diam: 3.60 cm Ao Asc diam:   2.90 cm MITRAL VALVE               TRICUSPID VALVE MV Area (PHT): 2.97 cm    Estimated RAP:  3.00 mmHg MV Decel Time: 255 msec MV E velocity: 82.80 cm/s  SHUNTS MV A velocity: 83.80 cm/s  Systemic VTI:  0.19 m MV E/A ratio:  0.99        Systemic Diam: 2.20 cm Sunit Tolia Electronically signed by Tessa Lerner Signature Date/Time: 08/17/2023/4:13:07 PM    Final    NM PET Image Restag (PS) Skull Base To Thigh Result Date: 08/16/2023 CLINICAL DATA:  Subsequent treatment strategy for Hodgkin's lymphoma. EXAM: NUCLEAR MEDICINE PET SKULL BASE TO THIGH TECHNIQUE: 12.1 mCi F-18 FDG was injected intravenously. Full-ring PET imaging was performed from the skull base to thigh after the radiotracer. CT data was obtained and used for attenuation correction and anatomic localization. Fasting blood glucose: 84 mg/dl COMPARISON:  2/44/0102 FINDINGS: Mediastinal blood pool activity: SUV max 2.4 Liver activity: SUV max 4.0 NECK: No hypermetabolic lymph nodes in the neck. Incidental CT findings: None. CHEST: Interval reduction in size and metabolic activity of recurrent adenopathy anterior to the SVC within the mediastinum. Residual tissue measures approximately 2.4 cm in thickness (image 73/4). The majority of the residual adenopathy is less than mediastinal blood pool. One focus in the posterior aspect of the residual mass with SUV max equal 3.5 which is between blood pool and liver activity (image 74). The previously described foci of radiotracer activity in the prevascular space have resolved completely with noe metabolic activity with remaining above blood pool. For example thickened prevascular tissue measuring 10 mm (image 66/4) with SUV max equal 2.5. No new sites of hypermetabolic activity in the thorax. No suspicious pulmonary nodules. Incidental CT findings: Port in the anterior chest wall with tip in distal SVC. ABDOMEN/PELVIS: No abnormal hypermetabolic activity within the liver, pancreas, adrenal glands, or spleen. No  hypermetabolic lymph nodes in the abdomen or pelvis. Normal volume spleen with normal metabolic activity.Normal testicular activity Incidental CT findings: . SKELETON: No focal hypermetabolic activity to suggest skeletal metastasis. Incidental CT findings: None. IMPRESSION: 1. Interval reduction in size and metabolic activity of recurrent mediastinal adenopathy. One small focus of activity anterior to the SVC with activity greater than blood pool but less than liver. Deauville 3. This is subtle finding. Majority of the previous described recurrent adenopathy has activity less than or equal to blood pool. 2. No new sites of hypermetabolic activity in the neck, chest, abdomen, or pelvis. 3. Normal volume spleen with normal metabolic activity. Electronically Signed   By: Genevive Bi M.D.   On: 08/16/2023 13:49    Surgical Pathology result from 07/03/2022: FINAL MICROSCOPIC DIAGNOSIS:  A. MEDIASTINAL MASS, ANTERIOR, NEEDLE CORE BIOPSY: -Atypical lymphoid proliferation consistent with classical Hodgkin lymphoma -See comment  COMMENT:  The sections show needle core biopsy fragments displaying a nodular and diffuse lymphoid proliferation associated with dense sclerosis.  The lymphoid process shows predominance of small lymphoid cells admixed to a lesser extent with histiocytes, eosinophils in addition to the large atypical mononuclear and occasionally lobated lymphoid appearing cells with variably prominent nucleoli.  Flow cytometric analysis was performed [facility] [phone]) and shows predominance of CD4-positive T cells. No monoclonal B-cell population identified.  In addition, a battery of immunohistochemical stains was performed including CD30, CD15, mum 1, LCA, CD20, PAX5, CD3, CD5, CD4, CD8, TdT, cytokeratin AE1/AE3, cytokeratin 8/18 and EBV in situ hybridization with appropriate controls.  The large atypical lymphoid appearing cells are positive for CD15, CD30, PAX5, mum 1 and rare cells for  CD20.  No significant staining is seen with LCA, EBV, CD3, CD5, CD4, CD8.  Cytokeratin stains highlight a small focus of positivity likely representing native epithelial elements.  No significant TdT positivity is identified.  The small lymphocytes in the background show a mixture of T and B cells with predominance of T cells.  The latter show predominance of CD4 positive cells.  Overall, the features are atypical and most consistent with classical Hodgkin lymphoma which is best subclassified as nodular sclerosis type.   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 The mass was causing some compression of his SVC and left brachiocephalic vein but no complete obstruction or symptoms related to this. No constitutional symptoms.  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  PLAN: -Discussed lab results from today, 08/24/2023, in detail with the patient. CBC is stable. CMP pending.  -Discussed PET scan results from 07/29/2023 in detail with the patient. Showed Interval reduction in size and metabolic activity of recurrent mediastinal adenopathy. One small focus of activity anterior to the SVC with activity greater than blood pool but less than liver. Deauville 3. This is subtle finding. Majority of the previous described recurrent adenopathy has activity less than or equal to blood pool. No new sites of hypermetabolic activity in the neck, chest, abdomen, or pelvis. -Discussed with the patient that we will still have to continue his current treatment.  -Answered all of patient's questions.  -Discussed with the patient that the skin rash/changes on his hands does not look like it is from  immunotherapy.  -Recommend to follow-up with PCP and other physician regarding narcolepsy.    -Patient only received Nivolumab treatment without ADCETRIS. He tolerated cycle 6 well without any new or severe toxicities.  -Patient can continue cycle 7 day 1 of his treatment, only Nivolumab and not ADCETRIS, during this visit. Oral Zyrtec instead of Benadryl and no steroids.  -Continue B-complex supplement.   FOLLOW-UP: Per integrated scheduling  The total time spent in the appointment was 32 minutes* .  All of the patient's questions were answered with apparent satisfaction. The patient knows to call the clinic with any problems, questions or concerns.   Wyvonnia Lora MD MS AAHIVMS [facility] Hematology/Oncology Physician [facility]  .*Total Encounter Time as defined by the Centers for Medicare and Medicaid Services includes, in addition to the face-to-face time of a patient visit (documented in the note above) non-face-to-face time: obtaining and reviewing outside history, ordering and reviewing medications, tests or procedures, care coordination (communications with other health care professionals or caregivers) and documentation in the medical record.   I,Param Shah,acting as a Neurosurgeon for Wyvonnia Lora, MD.,have documented all relevant documentation on the behalf of Wyvonnia Lora, MD,as directed by  Wyvonnia Lora, MD while in the presence of Wyvonnia Lora, MD.  .I have reviewed the above documentation for accuracy and completeness, and I agree with the above. Marland Kitchen  Johney Maine MD

## 2023-08-26 ENCOUNTER — Telehealth: Payer: Self-pay | Admitting: Hematology

## 2023-08-26 ENCOUNTER — Encounter: Payer: Self-pay | Admitting: Hematology

## 2023-08-26 NOTE — Telephone Encounter (Signed)
Spoke with patient mother confirming upcoming appointment

## 2023-08-31 ENCOUNTER — Encounter: Payer: Self-pay | Admitting: Hematology

## 2023-09-14 ENCOUNTER — Encounter: Payer: Self-pay | Admitting: Hematology

## 2023-09-21 ENCOUNTER — Inpatient Hospital Stay: Payer: Medicare HMO | Attending: Hematology

## 2023-09-21 ENCOUNTER — Encounter: Payer: Self-pay | Admitting: Hematology

## 2023-09-21 ENCOUNTER — Inpatient Hospital Stay (HOSPITAL_BASED_OUTPATIENT_CLINIC_OR_DEPARTMENT_OTHER): Payer: Medicare HMO | Admitting: Hematology

## 2023-09-21 ENCOUNTER — Inpatient Hospital Stay: Payer: Medicare HMO

## 2023-09-21 VITALS — BP 139/89 | HR 99 | Resp 16

## 2023-09-21 VITALS — BP 114/58 | HR 99 | Temp 98.1°F | Resp 17 | Wt 247.7 lb

## 2023-09-21 DIAGNOSIS — C8179 Other classical Hodgkin lymphoma, extranodal and solid organ sites: Secondary | ICD-10-CM | POA: Insufficient documentation

## 2023-09-21 DIAGNOSIS — Z5112 Encounter for antineoplastic immunotherapy: Secondary | ICD-10-CM | POA: Insufficient documentation

## 2023-09-21 DIAGNOSIS — Z95828 Presence of other vascular implants and grafts: Secondary | ICD-10-CM

## 2023-09-21 DIAGNOSIS — Z5111 Encounter for antineoplastic chemotherapy: Secondary | ICD-10-CM | POA: Diagnosis not present

## 2023-09-21 DIAGNOSIS — C8192 Hodgkin lymphoma, unspecified, intrathoracic lymph nodes: Secondary | ICD-10-CM

## 2023-09-21 DIAGNOSIS — Z79620 Long term (current) use of immunosuppressive biologic: Secondary | ICD-10-CM | POA: Diagnosis not present

## 2023-09-21 DIAGNOSIS — C8102 Nodular lymphocyte predominant Hodgkin lymphoma, intrathoracic lymph nodes: Secondary | ICD-10-CM

## 2023-09-21 LAB — CBC WITH DIFFERENTIAL (CANCER CENTER ONLY)
Abs Immature Granulocytes: 0.02 10*3/uL (ref 0.00–0.07)
Basophils Absolute: 0.1 10*3/uL (ref 0.0–0.1)
Basophils Relative: 1 %
Eosinophils Absolute: 0.4 10*3/uL (ref 0.0–0.5)
Eosinophils Relative: 7 %
HCT: 40.3 % (ref 39.0–52.0)
Hemoglobin: 13.9 g/dL (ref 13.0–17.0)
Immature Granulocytes: 0 %
Lymphocytes Relative: 37 %
Lymphs Abs: 2 10*3/uL (ref 0.7–4.0)
MCH: 29.9 pg (ref 26.0–34.0)
MCHC: 34.5 g/dL (ref 30.0–36.0)
MCV: 86.7 fL (ref 80.0–100.0)
Monocytes Absolute: 0.6 10*3/uL (ref 0.1–1.0)
Monocytes Relative: 11 %
Neutro Abs: 2.4 10*3/uL (ref 1.7–7.7)
Neutrophils Relative %: 44 %
Platelet Count: 308 10*3/uL (ref 150–400)
RBC: 4.65 MIL/uL (ref 4.22–5.81)
RDW: 13.2 % (ref 11.5–15.5)
WBC Count: 5.4 10*3/uL (ref 4.0–10.5)
nRBC: 0 % (ref 0.0–0.2)

## 2023-09-21 LAB — CMP (CANCER CENTER ONLY)
ALT: 28 U/L (ref 0–44)
AST: 23 U/L (ref 15–41)
Albumin: 4.3 g/dL (ref 3.5–5.0)
Alkaline Phosphatase: 123 U/L (ref 38–126)
Anion gap: 3 — ABNORMAL LOW (ref 5–15)
BUN: 14 mg/dL (ref 6–20)
CO2: 30 mmol/L (ref 22–32)
Calcium: 9.4 mg/dL (ref 8.9–10.3)
Chloride: 106 mmol/L (ref 98–111)
Creatinine: 0.73 mg/dL (ref 0.61–1.24)
GFR, Estimated: >60 mL/min (ref >60)
Glucose, Bld: 97 mg/dL (ref 70–99)
Potassium: 3.9 mmol/L (ref 3.5–5.1)
Sodium: 139 mmol/L (ref 135–145)
Total Bilirubin: 0.2 mg/dL (ref 0.0–1.2)
Total Protein: 7 g/dL (ref 6.5–8.1)

## 2023-09-21 MED ORDER — SODIUM CHLORIDE 0.9% FLUSH
10.0000 mL | INTRAVENOUS | Status: DC | PRN
  Administered 2023-09-21: 10 mL

## 2023-09-21 MED ORDER — SODIUM CHLORIDE 0.9% FLUSH
10.0000 mL | Freq: Once | INTRAVENOUS | Status: AC
  Administered 2023-09-21: 10 mL

## 2023-09-21 MED ORDER — ACETAMINOPHEN 325 MG PO TABS
650.0000 mg | ORAL_TABLET | Freq: Once | ORAL | Status: AC
  Administered 2023-09-21: 650 mg via ORAL
  Filled 2023-09-21: qty 2

## 2023-09-21 MED ORDER — NIVOLUMAB CHEMO INJECTION 100 MG/10ML
480.0000 mg | Freq: Once | INTRAVENOUS | Status: AC
  Administered 2023-09-21: 480 mg via INTRAVENOUS
  Filled 2023-09-21: qty 48

## 2023-09-21 MED ORDER — SODIUM CHLORIDE 0.9 % IV SOLN: Freq: Once | INTRAVENOUS | Status: AC

## 2023-09-21 MED ORDER — SODIUM CHLORIDE 0.9% FLUSH
3.0000 mL | INTRAVENOUS | Status: DC | PRN
  Administered 2023-09-21: 10 mL

## 2023-09-21 MED ORDER — HEPARIN SOD (PORK) LOCK FLUSH 100 UNIT/ML IV SOLN
500.0000 [IU] | Freq: Once | INTRAVENOUS | Status: AC | PRN
  Administered 2023-09-21: 500 [IU]

## 2023-09-21 MED ORDER — CETIRIZINE HCL 10 MG PO TABS
10.0000 mg | ORAL_TABLET | Freq: Once | ORAL | Status: AC
Start: 2023-09-21 — End: 2023-09-21
  Administered 2023-09-21: 10 mg via ORAL
  Filled 2023-09-21: qty 1

## 2023-09-21 NOTE — Progress Notes (Signed)
 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 male who has been referred to us  by .Lazoff, Shawn P, DO and Dr. Lige Alstrom MD for evaluation and management of newly diagnosed mediastinal mass concerning for likely lymphoma.  Patient has a history of fetal alcohol syndrome with developmental delay, narcolepsy, ADD, exercise-induced asthma and lives with his adoptive parents. He recently presented to the hospital on 07/01/2022 with 2-week history of worsening shortness of breath cough and wheezing.  He received his asthma treatment but was still noted to have significant cough.  In the emergency room he had a chest x-ray which showed an mediastinal mass. Subsequent CT chest with contrast on 07/01/2022 showed extensive mediastinal and hilar lymphadenopathy with conglomerate soft tissue mass/adenopathy within the anterior mediastinum measuring 11.2 x 7.9 x 13 cm. Compression of the left brachiocephalic and SVC within the mid though these vascular structures remain patent.  Patient did not have any clinical signs or symptoms of SVC compression syndrome or left upper extremity swelling.  Patient subsequently had a CT of the abdomen and pelvis which showed no acute intra-abdominal or intrapelvic abnormalities. He underwent a CT-guided core needle biopsy of the mediastinal mass by interventional radiology.  The official pathology results from his biopsy are not currently available at the time of this clinic visit. I did call and talk to the pathologist Dr. Frutoso and she notes that this either looks like a lymphoma or thymoma and she is running additional tests to make a final  diagnosis.  Patient's father accompanied him for this visit and his mother who helps make most of the decisions in tandem with his father was present on the phone.  They do not note any unexplained fevers chills night sweats or significant weight loss.  His breathing has been relatively stable since hospital discharge but he is still having some cough.  I confirmed adequacy of tissue sampling with the pathologist and the patient was then started on prednisone  to try to help his cough by drinking his mediastinal tumor some and reducing bronchial inflammation.  Patient is unable to provide much information or review of systems on account of his developmental delay issues.  Interval History:   Paul [name] is a wonderful 33 y.o. male who is here for continued evaluation and management of classical hogkins lymphoma. He is here to receive cycle 8 day 1 of his treatment, only Nivolumab  not ADCETRIS .  Patient was last seen by me on 08/24/2023 and he complained of mild skin changes/rashes on bilateral hand, but was doing well overall.   Patient is unaccompanied during this visit. Patient's mother was connected via phone during this visit. Patient notes he has been doing well overall without any new or severe medical concerns since our last visit. He denies any new infection issues, fever, chills, night sweats, unexpected weight loss, back pain, abdominal pain, or leg swelling.   Patient notes that his skin rashes have resolved since our last visit. His narcolepsy is still the same. Patient notes that his Modafinil  dosage has been increased to 200 mg. Patient's mother notes that Modafinil  dosage has not been helping his narcolepsy.   Patient's mother notes he recently had genetic testing and his next  appointment with his psychiatrist is next Friday.   He notes that he got bit by a spider last week and he was started on antibiotics.   MEDICAL HISTORY:  Past Medical History:  Diagnosis Date   ADD  (attention deficit disorder)    ADHD (attention deficit hyperactivity disorder)    Asthma    excercise induced and pollen   Auditory processing disorder    Complication of anesthesia    mother states pt. is harder to put under anes. and hard to wake up due to fetal alcohol syndrome; can be combative, per mother; states he will do better if mother is in PACU when he is coming out of anes.   Development delay    states mental status of a 33 year old   Exercise-induced asthma    prn inhaler/neb.   Fetal alcohol syndrome    Narcolepsy    Non-restorable tooth 09/2015   teeth   Separation anxiety    Twin birth     SURGICAL HISTORY: Past Surgical History:  Procedure Laterality Date   IR IMAGING GUIDED PORT INSERTION  08/11/2022   MULTIPLE EXTRACTIONS WITH ALVEOLOPLASTY N/A 09/23/2015   Procedure: MULTIPLE EXTRACTION WITH ALVEOLOPLASTY;  Surgeon: Glendia Primrose, DDS;  Location: [facility];  Service: Oral Surgery;  Laterality: N/A;   PYLOROMYOTOMY     age [age], performed found to not have stenosis on surgical exam   PYLOROMYOTOMY     did not have pyloric stenosis; did surgery on the wrong twin   RADIOLOGY WITH ANESTHESIA N/A 01/16/2020   Procedure: MRI WITH ANESTHESIA  BRAIN WITH AND WITHOUT CONTRAST;  Surgeon: Radiologist, Medication, MD;  Location: [facility] OR;  Service: Radiology;  Laterality: N/A;    SOCIAL HISTORY: Social History   Socioeconomic History   Marital status: Single    Spouse name: Not on file   Number of children: Not on file   Years of education: Not on file   Highest education level: Not on file  Occupational History   Not on file  Tobacco Use   Smoking status: Never   Smokeless tobacco: Never  Vaping Use   Vaping status: Never Used  Substance and Sexual Activity   Alcohol use: No   Drug use: No   Sexual activity: Not on file  Other Topics Concern   Not on file  Social History Narrative   ** Merged History Encounter **       Social Drivers of  Health   Financial Resource Strain: Low Risk  (04/09/2021)   Received from [facility] visits prior to 11/07/2022., [facility] visits prior to 11/07/2022.   Overall Financial Resource Strain (CARDIA)    Difficulty of Paying Living Expenses: Not hard at all  Food Insecurity: Low Risk  (07/01/2023)   [facility]   Hunger Vital Sign    Worried About Running Out of Food in the Last Year: Never true    Ran Out of Food in the Last Year: Never true  Transportation Needs: No Transportation Needs (07/01/2023)   Received from [employer]    In the past 12 months, has lack of reliable transportation kept you from medical appointments, meetings, work or from getting things needed for daily living? : No  Physical Activity: Insufficiently Active (04/09/2021)   [facility] from [facility] visits prior to 11/07/2022., [facility] visits prior to 11/07/2022.   Exercise Vital Sign  Days of Exercise per Week: 5 days    Minutes of Exercise per Session: 10 min  Stress: No Stress Concern Present (04/09/2021)   Received from [facility] visits prior to 11/07/2022., [facility] visits prior to 11/07/2022.   [employer] of Occupational Health - Occupational Stress Questionnaire    Feeling of Stress : Only a little  Social Connections: Socially Isolated (04/09/2021)   Received from [facility] visits prior to 11/07/2022., [facility] visits prior to 11/07/2022.   Social Advertising Account Executive [NHANES]    Frequency of Communication with Friends and Family: Never    Frequency of Social Gatherings with Friends and Family: Once a week    Attends Religious Services: Never    Database Administrator or Organizations: Yes    Attends Engineer, Structural: More than 4 times per year    Marital Status: Never  married  Intimate Partner Violence: Not At Risk (05/13/2023)   Humiliation, Afraid, Rape, and Kick questionnaire    Fear of Current or Ex-Partner: No    Emotionally Abused: No    Physically Abused: No    Sexually Abused: No    FAMILY HISTORY: Family History  Adopted: Yes  Problem Relation Age of Onset   Mental illness Mother    Diabetes Mother    Hypertension Mother    Schizophrenia Mother    Bipolar disorder Mother    Mental retardation Father    Sleep apnea Neg Hx     ALLERGIES:  is allergic to gabapentin  and other.  MEDICATIONS:  Current Outpatient Medications  Medication Sig Dispense Refill   albuterol  (PROVENTIL  HFA;VENTOLIN  HFA) 108 (90 BASE) MCG/ACT inhaler Inhale 2 puffs into the lungs every 6 (six) hours as needed for wheezing or shortness of breath.     albuterol  (PROVENTIL ) (5 MG/ML) 0.5% nebulizer solution Take 2.5 mg by nebulization every 6 (six) hours as needed for wheezing or shortness of breath.     guaiFENesin  (ROBITUSSIN) 100 MG/5ML liquid Take 5 mLs by mouth every 4 (four) hours as needed for cough or to loosen phlegm. 120 mL 0   ibuprofen  (ADVIL ) 800 MG tablet Take 800 mg by mouth every 8 (eight) hours as needed for mild pain or headache.     modafinil  (PROVIGIL ) 200 MG tablet Take 200 mg by mouth daily.     Multiple Vitamin (MULTIVITAMIN WITH MINERALS) TABS tablet Take 1 tablet by mouth daily with breakfast.     ondansetron  (ZOFRAN ) 4 MG tablet Take 1 tablet (4 mg total) by mouth every 6 (six) hours as needed for nausea. 20 tablet 0   prochlorperazine  (COMPAZINE ) 10 MG tablet Take 1 tablet (10 mg total) by mouth every 6 (six) hours as needed for nausea or vomiting. 30 tablet 1   Vitamin D , Ergocalciferol , (DRISDOL ) 1.25 MG (50000 UNIT) CAPS capsule Take 50,000 Units by mouth every Monday.     No current facility-administered medications for this visit.    REVIEW OF SYSTEMS:    10 Point review of Systems was done is negative except as noted above.    PHYSICAL EXAMINATION  .BP (!) 114/58 (BP Location: Left Arm, Patient Position: Sitting)   Pulse 99   Temp 98.1 F (36.7 C) (Temporal)   Resp 17   Wt 247 lb 11.2 oz (112.4 kg)   SpO2 100%   BMI 36.58 kg/m   GENERAL:alert, in no acute distress and comfortable SKIN: no  acute rashes, no significant lesions EYES: conjunctiva are pink and non-injected, sclera anicteric OROPHARYNX: MMM, no exudates, no oropharyngeal erythema or ulceration NECK: supple, no JVD LYMPH:  no palpable lymphadenopathy in the cervical, axillary or inguinal regions LUNGS: clear to auscultation b/l with normal respiratory effort HEART: regular rate & rhythm ABDOMEN:  normoactive bowel sounds , non tender, not distended. No splenomegaly. Extremity: no pedal edema PSYCH: alert & oriented x 3 with fluent speech NEURO: no focal motor/sensory deficits   LABORATORY DATA:  I have reviewed the data as listed  .SABRA    Latest Ref Rng & Units 09/21/2023   11:41 AM 08/24/2023    1:13 PM 07/27/2023   12:44 PM  CBC  WBC 4.0 - 10.5 K/uL 5.4  5.3  3.5   Hemoglobin 13.0 - 17.0 g/dL 86.0  86.2  85.6   Hematocrit 39.0 - 52.0 % 40.3  40.0  41.0   Platelets 150 - 400 K/uL 308  210  166    .    Latest Ref Rng & Units 09/21/2023   11:41 AM 08/24/2023    1:13 PM 07/27/2023   12:44 PM  CMP  Glucose 70 - 99 mg/dL 97  899  96   BUN 6 - 20 mg/dL 14  14  10    Creatinine 0.61 - 1.24 mg/dL 9.26  9.17  9.11   Sodium 135 - 145 mmol/L 139  138  136   Potassium 3.5 - 5.1 mmol/L 3.9  3.7  3.8   Chloride 98 - 111 mmol/L 106  103  102   CO2 22 - 32 mmol/L 30  30  29    Calcium 8.9 - 10.3 mg/dL 9.4  9.4  9.4   Total Protein 6.5 - 8.1 g/dL 7.0  6.8  7.2   Total Bilirubin 0.0 - 1.2 mg/dL 0.2  0.4  0.4   Alkaline Phos 38 - 126 U/L 123  124  122   AST 15 - 41 U/L 23  24  33   ALT 0 - 44 U/L 28  30  43    . Lab Results  Component Value Date   LDH 171 07/03/2022     RADIOGRAPHIC STUDIES: I have personally reviewed the  radiological images as listed and agreed with the findings in the report. No results found.   Surgical Pathology result from 07/03/2022: FINAL MICROSCOPIC DIAGNOSIS:  A. MEDIASTINAL MASS, ANTERIOR, NEEDLE CORE BIOPSY: -Atypical lymphoid proliferation consistent with classical Hodgkin lymphoma -See comment  COMMENT:  The sections show needle core biopsy fragments displaying a nodular and diffuse lymphoid proliferation associated with dense sclerosis.  The lymphoid process shows predominance of small lymphoid cells admixed to a lesser extent with histiocytes, eosinophils in addition to the large atypical mononuclear and occasionally lobated lymphoid appearing cells with variably prominent nucleoli.  Flow cytometric analysis was performed [facility] [id number]) and shows predominance of CD4-positive T cells. No monoclonal B-cell population identified.  In addition, a battery of immunohistochemical stains was performed including CD30, CD15, mum 1, LCA, CD20, PAX5, CD3, CD5, CD4, CD8, TdT, cytokeratin AE1/AE3, cytokeratin 8/18 and EBV in situ hybridization with appropriate controls.  The large atypical lymphoid appearing cells are positive for CD15, CD30, PAX5, mum 1 and rare cells for CD20.  No significant staining is seen with LCA, EBV, CD3, CD5, CD4, CD8.  Cytokeratin stains highlight a small focus of positivity likely representing native epithelial elements.  No significant TdT positivity is identified.  The small lymphocytes in the background show  a mixture of T and B cells with predominance of T cells.  The latter show predominance of CD4 positive cells.  Overall, the features are atypical and most consistent with classical Hodgkin lymphoma which is best subclassified as nodular sclerosis type.   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 The mass was causing some compression of his SVC and left brachiocephalic vein but no complete obstruction or symptoms related to this. No constitutional symptoms.  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  PLAN: -Discussed lab results from today, 09/21/2023, in detail with the patient. CBC and CMP stable.  -Discussed with the patient that we will still have to continue his current treatment.  -Answered all of patient's questions.  -Recommend to follow-up with PCP and other physician regarding narcolepsy.    -Patient only received Nivolumab  treatment without ADCETRIS . He tolerated cycle 7 well without any new or severe toxicities.  -Patient can continue cycle 8 day 1 of his treatment, only Nivolumab  and not ADCETRIS , during this visit. Oral Zyrtec  instead of Benadryl  and no steroids.  -Continue B-complex supplement.   FOLLOW-UP: Per integrative scheduling  The total time spent in the appointment was 30 minutes* .  All of the patient's questions were answered with apparent satisfaction. The patient knows to call the clinic with any problems, questions or concerns.   Emaline Saran MD MS AAHIVMS [facility] Hematology/Oncology Physician [facility]  .*Total Encounter Time as defined by the Centers for Medicare and Medicaid Services includes, in addition to the face-to-face time of a patient visit (documented in the note above) non-face-to-face time: obtaining and reviewing outside history, ordering and reviewing medications, tests or procedures, care coordination (communications with other health care professionals or caregivers) and documentation in the medical record.   I,Param Shah,acting as a neurosurgeon for Emaline Saran, MD.,have documented all relevant documentation on the behalf of Emaline Saran, MD,as directed by   Emaline Saran, MD while in the presence of Emaline Saran, MD.  .I have reviewed the above documentation for accuracy and completeness, and I agree with the above. .Carsynn Bethune Kishore Johnnie Goynes MD

## 2023-09-21 NOTE — Progress Notes (Signed)
 Patient seen by Dr. Addison Naegeli are within treatment parameters.  Labs reviewed: and are within treatment parameters.  Per physician team, patient is ready for treatment and there are NO modifications to the treatment plan.

## 2023-09-21 NOTE — Patient Instructions (Signed)
 [facility]   Discharge Instructions: Thank you for choosing [facility] to provide your oncology and hematology care.   If you have a lab appointment with the Cancer Center, please go directly to the Cancer Center and check in at the registration area.   Wear comfortable clothing and clothing appropriate for easy access to any Portacath or PICC line.   We strive to give you quality time with your provider. You may need to reschedule your appointment if you arrive late (15 or more minutes).  Arriving late affects you and other patients whose appointments are after yours.  Also, if you miss three or more appointments without notifying the office, you may be dismissed from the clinic at the provider's discretion.      For prescription refill requests, have your pharmacy contact our office and allow 72 hours for refills to be completed.    Today you received the following chemotherapy and/or immunotherapy agents: Nivolumab (Opdivo)      To help prevent nausea and vomiting after your treatment, we encourage you to take your nausea medication as directed.  BELOW ARE SYMPTOMS THAT SHOULD BE REPORTED IMMEDIATELY: *FEVER GREATER THAN 100.4 F (38 C) OR HIGHER *CHILLS OR SWEATING *NAUSEA AND VOMITING THAT IS NOT CONTROLLED WITH YOUR NAUSEA MEDICATION *UNUSUAL SHORTNESS OF BREATH *UNUSUAL BRUISING OR BLEEDING *URINARY PROBLEMS (pain or burning when urinating, or frequent urination) *BOWEL PROBLEMS (unusual diarrhea, constipation, pain near the anus) TENDERNESS IN MOUTH AND THROAT WITH OR WITHOUT PRESENCE OF ULCERS (sore throat, sores in mouth, or a toothache) UNUSUAL RASH, SWELLING OR PAIN  UNUSUAL VAGINAL DISCHARGE OR ITCHING   Items with * indicate a potential emergency and should be followed up as soon as possible or go to the Emergency Department if any problems should occur.  Please show the CHEMOTHERAPY ALERT CARD or  IMMUNOTHERAPY ALERT CARD at check-in to the Emergency Department and triage nurse.  Should you have questions after your visit or need to cancel or reschedule your appointment, please contact [facility]  Dept: [phone]  and follow the prompts.  Office hours are 8:00 a.m. to 4:30 p.m. Monday - Friday. Please note that voicemails left after 4:00 p.m. may not be returned until the following business day.  We are closed weekends and major holidays. You have access to a nurse at all times for urgent questions. Please call the main number to the clinic Dept: [phone] and follow the prompts.   For any non-urgent questions, you may also contact your provider using MyChart. We now offer e-Visits for anyone 4 and older to request care online for non-urgent symptoms. For details visit [email or web address].   Also download the MyChart app! Go to the app store, search "MyChart", open the app, select [place], and log in with your MyChart username and password.

## 2023-09-24 ENCOUNTER — Encounter: Payer: Self-pay | Admitting: Hematology

## 2023-09-27 ENCOUNTER — Encounter: Payer: Self-pay | Admitting: Hematology

## 2023-10-06 ENCOUNTER — Encounter: Payer: Self-pay | Admitting: Hematology

## 2023-10-06 ENCOUNTER — Ambulatory Visit: Payer: Medicare HMO | Admitting: Neurology

## 2023-10-13 ENCOUNTER — Encounter: Payer: Self-pay | Admitting: Hematology

## 2023-10-18 ENCOUNTER — Encounter: Payer: Self-pay | Admitting: Hematology

## 2023-10-19 ENCOUNTER — Other Ambulatory Visit: Payer: Medicare HMO

## 2023-10-19 ENCOUNTER — Ambulatory Visit: Payer: Medicare HMO | Admitting: Hematology

## 2023-10-19 ENCOUNTER — Ambulatory Visit: Payer: Medicare HMO

## 2023-10-20 ENCOUNTER — Inpatient Hospital Stay: Payer: Medicare HMO

## 2023-10-20 ENCOUNTER — Inpatient Hospital Stay: Payer: Medicare HMO | Admitting: Hematology

## 2023-10-20 ENCOUNTER — Telehealth: Payer: Self-pay | Admitting: Hematology

## 2023-10-20 NOTE — Telephone Encounter (Signed)
Spoke with patient confirming upcoming appointment

## 2023-10-25 ENCOUNTER — Inpatient Hospital Stay: Payer: Medicare HMO | Attending: Hematology | Admitting: Hematology

## 2023-10-25 ENCOUNTER — Inpatient Hospital Stay: Payer: Medicare HMO

## 2023-10-25 VITALS — BP 120/90 | HR 86 | Temp 97.3°F | Resp 16 | Ht 69.0 in | Wt 251.4 lb

## 2023-10-25 DIAGNOSIS — Z79620 Long term (current) use of immunosuppressive biologic: Secondary | ICD-10-CM | POA: Insufficient documentation

## 2023-10-25 DIAGNOSIS — C8179 Other classical Hodgkin lymphoma, extranodal and solid organ sites: Secondary | ICD-10-CM | POA: Insufficient documentation

## 2023-10-25 DIAGNOSIS — Z95828 Presence of other vascular implants and grafts: Secondary | ICD-10-CM

## 2023-10-25 DIAGNOSIS — C8192 Hodgkin lymphoma, unspecified, intrathoracic lymph nodes: Secondary | ICD-10-CM

## 2023-10-25 DIAGNOSIS — C8102 Nodular lymphocyte predominant Hodgkin lymphoma, intrathoracic lymph nodes: Secondary | ICD-10-CM

## 2023-10-25 DIAGNOSIS — Z5112 Encounter for antineoplastic immunotherapy: Secondary | ICD-10-CM | POA: Diagnosis present

## 2023-10-25 DIAGNOSIS — Z5111 Encounter for antineoplastic chemotherapy: Secondary | ICD-10-CM

## 2023-10-25 LAB — CBC WITH DIFFERENTIAL (CANCER CENTER ONLY)
Abs Immature Granulocytes: 0.01 10*3/uL (ref 0.00–0.07)
Basophils Absolute: 0.1 10*3/uL (ref 0.0–0.1)
Basophils Relative: 1 %
Eosinophils Absolute: 0.4 10*3/uL (ref 0.0–0.5)
Eosinophils Relative: 6 %
HCT: 39.6 % (ref 39.0–52.0)
Hemoglobin: 13.7 g/dL (ref 13.0–17.0)
Immature Granulocytes: 0 %
Lymphocytes Relative: 32 %
Lymphs Abs: 1.8 10*3/uL (ref 0.7–4.0)
MCH: 30.2 pg (ref 26.0–34.0)
MCHC: 34.6 g/dL (ref 30.0–36.0)
MCV: 87.2 fL (ref 80.0–100.0)
Monocytes Absolute: 0.5 10*3/uL (ref 0.1–1.0)
Monocytes Relative: 9 %
Neutro Abs: 2.9 10*3/uL (ref 1.7–7.7)
Neutrophils Relative %: 52 %
Platelet Count: 235 10*3/uL (ref 150–400)
RBC: 4.54 MIL/uL (ref 4.22–5.81)
RDW: 12.6 % (ref 11.5–15.5)
WBC Count: 5.6 10*3/uL (ref 4.0–10.5)
nRBC: 0 % (ref 0.0–0.2)

## 2023-10-25 LAB — CMP (CANCER CENTER ONLY)
ALT: 31 U/L (ref 0–44)
AST: 28 U/L (ref 15–41)
Albumin: 4.3 g/dL (ref 3.5–5.0)
Alkaline Phosphatase: 127 U/L — ABNORMAL HIGH (ref 38–126)
Anion gap: 4 — ABNORMAL LOW (ref 5–15)
BUN: 14 mg/dL (ref 6–20)
CO2: 30 mmol/L (ref 22–32)
Calcium: 9.3 mg/dL (ref 8.9–10.3)
Chloride: 104 mmol/L (ref 98–111)
Creatinine: 0.79 mg/dL (ref 0.61–1.24)
GFR, Estimated: >60 mL/min (ref >60)
Glucose, Bld: 103 mg/dL — ABNORMAL HIGH (ref 70–99)
Potassium: 3.9 mmol/L (ref 3.5–5.1)
Sodium: 138 mmol/L (ref 135–145)
Total Bilirubin: 0.3 mg/dL (ref 0.0–1.2)
Total Protein: 6.6 g/dL (ref 6.5–8.1)

## 2023-10-25 MED ORDER — SODIUM CHLORIDE 0.9 % IV SOLN
480.0000 mg | Freq: Once | INTRAVENOUS | Status: AC
  Administered 2023-10-25: 480 mg via INTRAVENOUS
  Filled 2023-10-25: qty 48

## 2023-10-25 MED ORDER — CETIRIZINE HCL 10 MG PO TABS
10.0000 mg | ORAL_TABLET | Freq: Every day | ORAL | Status: DC
  Administered 2023-10-25: 10 mg via ORAL
  Filled 2023-10-25: qty 1

## 2023-10-25 MED ORDER — HEPARIN SOD (PORK) LOCK FLUSH 100 UNIT/ML IV SOLN
500.0000 [IU] | Freq: Once | INTRAVENOUS | Status: DC | PRN
Start: 2023-10-25 — End: 2023-10-25

## 2023-10-25 MED ORDER — SODIUM CHLORIDE 0.9% FLUSH: 10.0000 mL | INTRAVENOUS | Status: DC | PRN

## 2023-10-25 MED ORDER — ACETAMINOPHEN 325 MG PO TABS
650.0000 mg | ORAL_TABLET | Freq: Once | ORAL | Status: AC
  Administered 2023-10-25: 650 mg via ORAL
  Filled 2023-10-25: qty 2

## 2023-10-25 MED ORDER — SODIUM CHLORIDE 0.9 % IV SOLN: Freq: Once | INTRAVENOUS | Status: AC

## 2023-10-25 MED ORDER — SODIUM CHLORIDE 0.9% FLUSH
10.0000 mL | Freq: Once | INTRAVENOUS | Status: AC
  Administered 2023-10-25: 10 mL

## 2023-10-25 NOTE — Patient Instructions (Signed)
 [facility]  Discharge Instructions: Thank you for choosing [facility] to provide your oncology and hematology care.   If you have a lab appointment with the Cancer Center, please go directly to the Cancer Center and check in at the registration area.   Wear comfortable clothing and clothing appropriate for easy access to any Portacath or PICC line.   We strive to give you quality time with your provider. You may need to reschedule your appointment if you arrive late (15 or more minutes).  Arriving late affects you and other patients whose appointments are after yours.  Also, if you miss three or more appointments without notifying the office, you may be dismissed from the clinic at the provider's discretion.      For prescription refill requests, have your pharmacy contact our office and allow 72 hours for refills to be completed.    Today you received the following chemotherapy and/or immunotherapy agents nivolumab      To help prevent nausea and vomiting after your treatment, we encourage you to take your nausea medication as directed.  BELOW ARE SYMPTOMS THAT SHOULD BE REPORTED IMMEDIATELY: *FEVER GREATER THAN 100.4 F (38 C) OR HIGHER *CHILLS OR SWEATING *NAUSEA AND VOMITING THAT IS NOT CONTROLLED WITH YOUR NAUSEA MEDICATION *UNUSUAL SHORTNESS OF BREATH *UNUSUAL BRUISING OR BLEEDING *URINARY PROBLEMS (pain or burning when urinating, or frequent urination) *BOWEL PROBLEMS (unusual diarrhea, constipation, pain near the anus) TENDERNESS IN MOUTH AND THROAT WITH OR WITHOUT PRESENCE OF ULCERS (sore throat, sores in mouth, or a toothache) UNUSUAL RASH, SWELLING OR PAIN  UNUSUAL VAGINAL DISCHARGE OR ITCHING   Items with * indicate a potential emergency and should be followed up as soon as possible or go to the Emergency Department if any problems should occur.  Please show the CHEMOTHERAPY ALERT CARD or IMMUNOTHERAPY  ALERT CARD at check-in to the Emergency Department and triage nurse.  Should you have questions after your visit or need to cancel or reschedule your appointment, please contact [facility]  Dept: [phone]  and follow the prompts.  Office hours are 8:00 a.m. to 4:30 p.m. Monday - Friday. Please note that voicemails left after 4:00 p.m. may not be returned until the following business day.  We are closed weekends and major holidays. You have access to a nurse at all times for urgent questions. Please call the main number to the clinic Dept: [phone] and follow the prompts.   For any non-urgent questions, you may also contact your provider using MyChart. We now offer e-Visits for anyone 28 and older to request care online for non-urgent symptoms. For details visit [email or web address].   Also download the MyChart app! Go to the app store, search "MyChart", open the app, select [place], and log in with your MyChart username and password.

## 2023-10-25 NOTE — Progress Notes (Signed)
 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 male who has been referred to [place] by .Lazoff, Shawn P, DO and Dr. Clydell Hakim MD for evaluation and management of newly diagnosed mediastinal mass concerning for likely lymphoma.  Patient has a history of fetal alcohol syndrome with developmental delay, narcolepsy, ADD, exercise-induced asthma and lives with his adoptive parents. He recently presented to the hospital on 07/01/2022 with 2-week history of worsening shortness of breath cough and wheezing.  He received his asthma treatment but was still noted to have significant cough.  In the emergency room he had a chest x-ray which showed an mediastinal mass. Subsequent CT chest with contrast on 07/01/2022 showed extensive mediastinal and hilar lymphadenopathy with conglomerate soft tissue mass/adenopathy within the anterior mediastinum measuring 11.2 x 7.9 x 13 cm. Compression of the left brachiocephalic and SVC within the mid though these vascular structures remain patent.  Patient did not have any clinical signs or symptoms of SVC compression syndrome or left upper extremity swelling.  Patient subsequently had a CT of the abdomen and pelvis which showed no acute intra-abdominal or intrapelvic abnormalities. He underwent a CT-guided core needle biopsy of the mediastinal mass by interventional radiology.  The official pathology results from his biopsy are not currently available at the time of this clinic visit. I did call and talk to the pathologist Dr. St. Marie Callas and she notes that this either looks like a lymphoma or thymoma and she is running additional tests to make a final  diagnosis.  Patient's father accompanied him for this visit and his mother who helps make most of the decisions in tandem with his father was present on the phone.  They do not note any unexplained fevers chills night sweats or significant weight loss.  His breathing has been relatively stable since hospital discharge but he is still having some cough.  I confirmed adequacy of tissue sampling with the pathologist and the patient was then started on prednisone to try to help his cough by drinking his mediastinal tumor some and reducing bronchial inflammation.  Patient is unable to provide much information or review of systems on account of his developmental delay issues.  Interval History:   Paul [name] is a wonderful 33 y.o. male who is here for continued evaluation and management of classical hogkins lymphoma. He is here to receive cycle 9 day 1 of his treatment, only Nivolumab not ADCETRIS.  Patient was last seen by me on 09/21/2023 and he complained of narcolepsy, but was doing well overall.   Patient is accompanied by his father during this visit. Patient notes he has been doing well overall since our last visit. He denies any new infection issues, fever, chills, night sweats, unexpected weight loss, back pain, chest pain, abdominal pain, enlarged lymph nodes, or leg swelling.  Patient notes that his physician has increased his Modafinil dosage to twice daily, which has been helping his narcolepsy symptoms.   He has been tolerating his Nivolumab treatment well without any new or severe toxicities.   MEDICAL HISTORY:  Past Medical History:  Diagnosis Date   ADD (attention deficit disorder)    ADHD (attention deficit hyperactivity disorder)    Asthma    excercise  induced and pollen   Auditory processing disorder    Complication of anesthesia    mother states pt. is harder to put under anes. and hard to wake up due to fetal alcohol syndrome; can be combative, per mother; states he  will do better if mother is in PACU when he is coming out of anes.   Development delay    states mental status of a 33 year old   Exercise-induced asthma    prn inhaler/neb.   Fetal alcohol syndrome    Narcolepsy    Non-restorable tooth 09/2015   teeth   Separation anxiety    Twin birth     SURGICAL HISTORY: Past Surgical History:  Procedure Laterality Date   IR IMAGING GUIDED PORT INSERTION  08/11/2022   MULTIPLE EXTRACTIONS WITH ALVEOLOPLASTY N/A 09/23/2015   Procedure: MULTIPLE EXTRACTION WITH ALVEOLOPLASTY;  Surgeon: Ocie Doyne, DDS;  Location: [facility];  Service: Oral Surgery;  Laterality: N/A;   PYLOROMYOTOMY     age [age], performed found to not have stenosis on surgical exam   PYLOROMYOTOMY     did not have pyloric stenosis; did surgery on the wrong twin   RADIOLOGY WITH ANESTHESIA N/A 01/16/2020   Procedure: MRI WITH ANESTHESIA  BRAIN WITH AND WITHOUT CONTRAST;  Surgeon: Radiologist, Medication, MD;  Location: [facility] OR;  Service: Radiology;  Laterality: N/A;    SOCIAL HISTORY: Social History   Socioeconomic History   Marital status: Single    Spouse name: Not on file   Number of children: Not on file   Years of education: Not on file   Highest education level: Not on file  Occupational History   Not on file  Tobacco Use   Smoking status: Never   Smokeless tobacco: Never  Vaping Use   Vaping status: Never Used  Substance and Sexual Activity   Alcohol use: No   Drug use: No   Sexual activity: Not on file  Other Topics Concern   Not on file  Social History Narrative   ** Merged History Encounter **       Social Drivers of Health   Financial Resource Strain: Low Risk  (04/09/2021)   [facility] prior to 11/07/2022., [facility] visits prior to 11/07/2022.   Overall Financial Resource Strain (CARDIA)    Difficulty of Paying Living Expenses: Not hard at all  Food Insecurity: Low Risk   (07/01/2023)   [facility]   Hunger Vital Sign    Worried About Running Out of Food in the Last Year: Never true    Ran Out of Food in the Last Year: Never true  Transportation Needs: No Transportation Needs (07/01/2023)   Received from [employer]    In the past 12 months, has lack of reliable transportation kept you from medical appointments, meetings, work or from getting things needed for daily living? : No  Physical Activity: Insufficiently Active (04/09/2021)   [facility] visits prior to 11/07/2022., [facility] visits prior to 11/07/2022.   Exercise Vital Sign    Days of Exercise per Week: 5 days    Minutes of Exercise per Session: 10 min  Stress: No Stress Concern Present (04/09/2021)   [facility] from [facility] visits prior to 11/07/2022., [facility] visits prior to 11/07/2022.   [employer] of Occupational Health - Occupational Stress Questionnaire  Feeling of Stress : Only a little  Social Connections: Socially Isolated (04/09/2021)   Received from [facility] visits prior to 11/07/2022., [facility] [employer] [facility] [employer] visits prior to 11/07/2022.   Social Advertising account executive [NHANES]    Frequency of Communication with Friends and Family: Never    Frequency of Social Gatherings with Friends and Family: Once a week    Attends Religious Services: Never    Database administrator or Organizations: Yes    Attends Engineer, structural: More than 4 times per year    Marital Status: Never married  Intimate Partner Violence: Not At Risk (05/13/2023)   Humiliation, Afraid, Rape, and Kick questionnaire    Fear of Current or Ex-Partner: No    Emotionally Abused: No    Physically Abused: No    Sexually Abused: No    FAMILY HISTORY: Family History  Adopted: Yes  Problem Relation Age of Onset   Mental illness  Mother    Diabetes Mother    Hypertension Mother    Schizophrenia Mother    Bipolar disorder Mother    Mental retardation Father    Sleep apnea Neg Hx     ALLERGIES:  is allergic to gabapentin and other.  MEDICATIONS:  Current Outpatient Medications  Medication Sig Dispense Refill   albuterol (PROVENTIL HFA;VENTOLIN HFA) 108 (90 BASE) MCG/ACT inhaler Inhale 2 puffs into the lungs every 6 (six) hours as needed for wheezing or shortness of breath.     albuterol (PROVENTIL) (5 MG/ML) 0.5% nebulizer solution Take 2.5 mg by nebulization every 6 (six) hours as needed for wheezing or shortness of breath.     guaiFENesin (ROBITUSSIN) 100 MG/5ML liquid Take 5 mLs by mouth every 4 (four) hours as needed for cough or to loosen phlegm. 120 mL 0   ibuprofen (ADVIL) 800 MG tablet Take 800 mg by mouth every 8 (eight) hours as needed for mild pain or headache.     modafinil (PROVIGIL) 200 MG tablet Take 200 mg by mouth daily.     Multiple Vitamin (MULTIVITAMIN WITH MINERALS) TABS tablet Take 1 tablet by mouth daily with breakfast.     ondansetron (ZOFRAN) 4 MG tablet Take 1 tablet (4 mg total) by mouth every 6 (six) hours as needed for nausea. 20 tablet 0   prochlorperazine (COMPAZINE) 10 MG tablet Take 1 tablet (10 mg total) by mouth every 6 (six) hours as needed for nausea or vomiting. 30 tablet 1   Vitamin D, Ergocalciferol, (DRISDOL) 1.25 MG (50000 UNIT) CAPS capsule Take 50,000 Units by mouth every Monday.     No current facility-administered medications for this visit.    REVIEW OF SYSTEMS:    10 Point review of Systems was done is negative except as noted above.   PHYSICAL EXAMINATION  .BP (!) 120/90 (BP Location: Left Arm, Patient Position: Sitting) Comment: nurse notified  Pulse 86   Temp (!) 97.3 F (36.3 C) (Temporal)   Resp 16   Ht 5\' 9"  (1.753 m)   Wt 251 lb 6.4 oz (114 kg)   SpO2 100%   BMI 37.13 kg/m   GENERAL:alert, in no acute distress and comfortable SKIN: no acute  rashes, no significant lesions EYES: conjunctiva are pink and non-injected, sclera anicteric OROPHARYNX: MMM, no exudates, no oropharyngeal erythema or ulceration NECK: supple, no JVD LYMPH:  no palpable lymphadenopathy in the cervical, axillary or inguinal regions LUNGS: clear to auscultation b/l with normal respiratory effort HEART: regular rate &  rhythm ABDOMEN:  normoactive bowel sounds , non tender, not distended. No splenomegaly. Extremity: no pedal edema PSYCH: alert & oriented x 3 with fluent speech NEURO: no focal motor/sensory deficits   LABORATORY DATA:  I have reviewed the data as listed  .Marland Kitchen    Latest Ref Rng & Units 10/25/2023   12:08 PM 09/21/2023   11:41 AM 08/24/2023    1:13 PM  CBC  WBC 4.0 - 10.5 K/uL 5.6  5.4  5.3   Hemoglobin 13.0 - 17.0 g/dL 29.5  28.4  13.2   Hematocrit 39.0 - 52.0 % 39.6  40.3  40.0   Platelets 150 - 400 K/uL 235  308  210    .    Latest Ref Rng & Units 10/25/2023   12:08 PM 09/21/2023   11:41 AM 08/24/2023    1:13 PM  CMP  Glucose 70 - 99 mg/dL 440  97  102   BUN 6 - 20 mg/dL 14  14  14    Creatinine 0.61 - 1.24 mg/dL 7.25  3.66  4.40   Sodium 135 - 145 mmol/L 138  139  138   Potassium 3.5 - 5.1 mmol/L 3.9  3.9  3.7   Chloride 98 - 111 mmol/L 104  106  103   CO2 22 - 32 mmol/L 30  30  30    Calcium 8.9 - 10.3 mg/dL 9.3  9.4  9.4   Total Protein 6.5 - 8.1 g/dL 6.6  7.0  6.8   Total Bilirubin 0.0 - 1.2 mg/dL 0.3  0.2  0.4   Alkaline Phos 38 - 126 U/L 127  123  124   AST 15 - 41 U/L 28  23  24    ALT 0 - 44 U/L 31  28  30     . Lab Results  Component Value Date   LDH 171 07/03/2022     RADIOGRAPHIC STUDIES: I have personally reviewed the radiological images as listed and agreed with the findings in the report. No results found.   Surgical Pathology result from 07/03/2022: FINAL MICROSCOPIC DIAGNOSIS:  A. MEDIASTINAL MASS, ANTERIOR, NEEDLE CORE BIOPSY: -Atypical lymphoid proliferation consistent with classical  Hodgkin lymphoma -See comment  COMMENT:  The sections show needle core biopsy fragments displaying a nodular and diffuse lymphoid proliferation associated with dense sclerosis.  The lymphoid process shows predominance of small lymphoid cells admixed to a lesser extent with histiocytes, eosinophils in addition to the large atypical mononuclear and occasionally lobated lymphoid appearing cells with variably prominent nucleoli.  Flow cytometric analysis was performed [facility] [phone]) and shows predominance of CD4-positive T cells. No monoclonal B-cell population identified.  In addition, a battery of immunohistochemical stains was performed including CD30, CD15, mum 1, LCA, CD20, PAX5, CD3, CD5, CD4, CD8, TdT, cytokeratin AE1/AE3, cytokeratin 8/18 and EBV in situ hybridization with appropriate controls.  The large atypical lymphoid appearing cells are positive for CD15, CD30, PAX5, mum 1 and rare cells for CD20.  No significant staining is seen with LCA, EBV, CD3, CD5, CD4, CD8.  Cytokeratin stains highlight a small focus of positivity likely representing native epithelial elements.  No significant TdT positivity is identified.  The small lymphocytes in the background show a mixture of T and B cells with predominance of T cells.  The latter show predominance of CD4 positive cells.  Overall, the features are atypical and most consistent with classical Hodgkin lymphoma which is best subclassified as nodular sclerosis type.   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 The mass was causing some compression of his SVC and left brachiocephalic vein but no complete obstruction or symptoms related to this. No constitutional symptoms.  #2 congenital developmental delay due to fetal alcohol  syndrome  #3 ADD and narcolepsy  #4 poor dental status multiple carious teeth.  PLAN: -Discussed lab results from today, 10/25/2023, in detail with the patient. CBC stable. CMP stable.   -Discussed with the patient that we will continue his current treatment.  -Answered all of patient's questions.  -Patient only received Nivolumab treatment without ADCETRIS. He tolerated cycle 8 well without any new or severe toxicities.  -Patient can continue cycle 9 day 1 of his treatment, only Nivolumab and not ADCETRIS, during this visit. Oral Zyrtec instead of Benadryl and no steroids.  -Continue B-complex supplement.   FOLLOW-UP: Continue monthly Nivolumab per integrated scheduling PET/CT in 6 weeks   The total time spent in the appointment was 30 minutes* .  All of the patient's questions were answered with apparent satisfaction. The patient knows to call the clinic with any problems, questions or concerns.   Wyvonnia Lora MD MS AAHIVMS [facility] Hematology/Oncology Physician [facility]  .*Total Encounter Time as defined by the Centers for Medicare and Medicaid Services includes, in addition to the face-to-face time of a patient visit (documented in the note above) non-face-to-face time: obtaining and reviewing outside history, ordering and reviewing medications, tests or procedures, care coordination (communications with other health care professionals or caregivers) and documentation in the medical record.   I,Param Shah,acting as a Neurosurgeon for Wyvonnia Lora, MD.,have documented all relevant documentation on the behalf of Wyvonnia Lora, MD,as directed by  Wyvonnia Lora, MD while in the presence of Wyvonnia Lora, MD.  .I have reviewed the above documentation for accuracy and completeness, and I agree with the above. Johney Maine MD

## 2023-10-31 ENCOUNTER — Encounter: Payer: Self-pay | Admitting: Hematology

## 2023-11-04 ENCOUNTER — Telehealth: Payer: Self-pay | Admitting: Neurology

## 2023-11-04 NOTE — Telephone Encounter (Signed)
 Patient mother asking if can refer to  a memory clinic. Patient is forgetful,  can not remember what he went to the kitchen to get, forgets everyday things he could remember before. Would like a call from the nurse.

## 2023-11-08 ENCOUNTER — Encounter: Payer: Self-pay | Admitting: Hematology

## 2023-11-08 NOTE — Telephone Encounter (Signed)
 I called mother of pt.  I relayed that back in November 2024 documentation about referral from pcp showed to [facility].   I gave her the # to [facility] center.  She appreciated information.

## 2023-11-08 NOTE — Telephone Encounter (Signed)
 I called mother of pt, pts memory has gone downhill and asked for a memory clinic.  We have seen in past but last 2 visits have dealt with osa/cpap.  He is not using his cpap  consistently.  She says he sleeps enough hours during day..  she says he diagnosed with narcolepsy.

## 2023-11-08 NOTE — Telephone Encounter (Signed)
 We have addressed this before and I recommended in Nov. 2024 that PCP refer him to an academic center for memory concerns. Please reiterate my recommendation to patient's mom.

## 2023-11-15 ENCOUNTER — Encounter: Payer: Self-pay | Admitting: Hematology

## 2023-11-16 ENCOUNTER — Inpatient Hospital Stay: Payer: Medicare HMO | Admitting: Hematology

## 2023-11-16 ENCOUNTER — Ambulatory Visit: Payer: Medicare HMO

## 2023-11-16 ENCOUNTER — Inpatient Hospital Stay: Payer: Medicare HMO

## 2023-11-22 ENCOUNTER — Inpatient Hospital Stay: Attending: Hematology

## 2023-11-22 ENCOUNTER — Inpatient Hospital Stay

## 2023-11-22 ENCOUNTER — Inpatient Hospital Stay (HOSPITAL_BASED_OUTPATIENT_CLINIC_OR_DEPARTMENT_OTHER): Admitting: Hematology

## 2023-11-22 VITALS — BP 120/79 | HR 90 | Temp 98.1°F | Resp 16 | Wt 251.8 lb

## 2023-11-22 DIAGNOSIS — C8179 Other classical Hodgkin lymphoma, extranodal and solid organ sites: Secondary | ICD-10-CM | POA: Diagnosis present

## 2023-11-22 DIAGNOSIS — Z95828 Presence of other vascular implants and grafts: Secondary | ICD-10-CM

## 2023-11-22 DIAGNOSIS — C8192 Hodgkin lymphoma, unspecified, intrathoracic lymph nodes: Secondary | ICD-10-CM

## 2023-11-22 DIAGNOSIS — Z5111 Encounter for antineoplastic chemotherapy: Secondary | ICD-10-CM | POA: Diagnosis not present

## 2023-11-22 DIAGNOSIS — Z5112 Encounter for antineoplastic immunotherapy: Secondary | ICD-10-CM | POA: Insufficient documentation

## 2023-11-22 DIAGNOSIS — Z79620 Long term (current) use of immunosuppressive biologic: Secondary | ICD-10-CM | POA: Diagnosis not present

## 2023-11-22 DIAGNOSIS — C8102 Nodular lymphocyte predominant Hodgkin lymphoma, intrathoracic lymph nodes: Secondary | ICD-10-CM

## 2023-11-22 LAB — CMP (CANCER CENTER ONLY)
ALT: 27 U/L (ref 0–44)
AST: 22 U/L (ref 15–41)
Albumin: 4.1 g/dL (ref 3.5–5.0)
Alkaline Phosphatase: 109 U/L (ref 38–126)
Anion gap: 5 (ref 5–15)
BUN: 15 mg/dL (ref 6–20)
CO2: 30 mmol/L (ref 22–32)
Calcium: 8.9 mg/dL (ref 8.9–10.3)
Chloride: 105 mmol/L (ref 98–111)
Creatinine: 0.86 mg/dL (ref 0.61–1.24)
GFR, Estimated: >60 mL/min (ref >60)
Glucose, Bld: 123 mg/dL — ABNORMAL HIGH (ref 70–99)
Potassium: 3.5 mmol/L (ref 3.5–5.1)
Sodium: 140 mmol/L (ref 135–145)
Total Bilirubin: 0.3 mg/dL (ref 0.0–1.2)
Total Protein: 6.8 g/dL (ref 6.5–8.1)

## 2023-11-22 LAB — CBC WITH DIFFERENTIAL (CANCER CENTER ONLY)
Abs Immature Granulocytes: 0.01 10*3/uL (ref 0.00–0.07)
Basophils Absolute: 0 10*3/uL (ref 0.0–0.1)
Basophils Relative: 1 %
Eosinophils Absolute: 0.4 10*3/uL (ref 0.0–0.5)
Eosinophils Relative: 7 %
HCT: 39.2 % (ref 39.0–52.0)
Hemoglobin: 13.4 g/dL (ref 13.0–17.0)
Immature Granulocytes: 0 %
Lymphocytes Relative: 39 %
Lymphs Abs: 2.3 10*3/uL (ref 0.7–4.0)
MCH: 30.4 pg (ref 26.0–34.0)
MCHC: 34.2 g/dL (ref 30.0–36.0)
MCV: 88.9 fL (ref 80.0–100.0)
Monocytes Absolute: 0.5 10*3/uL (ref 0.1–1.0)
Monocytes Relative: 9 %
Neutro Abs: 2.6 10*3/uL (ref 1.7–7.7)
Neutrophils Relative %: 44 %
Platelet Count: 219 10*3/uL (ref 150–400)
RBC: 4.41 MIL/uL (ref 4.22–5.81)
RDW: 12.2 % (ref 11.5–15.5)
WBC Count: 5.9 10*3/uL (ref 4.0–10.5)
nRBC: 0 % (ref 0.0–0.2)

## 2023-11-22 MED ORDER — ACETAMINOPHEN 325 MG PO TABS
650.0000 mg | ORAL_TABLET | Freq: Once | ORAL | Status: AC
  Administered 2023-11-22: 650 mg via ORAL
  Filled 2023-11-22: qty 2

## 2023-11-22 MED ORDER — SODIUM CHLORIDE 0.9 % IV SOLN: Freq: Once | INTRAVENOUS | Status: AC

## 2023-11-22 MED ORDER — SODIUM CHLORIDE 0.9 % IV SOLN
480.0000 mg | Freq: Once | INTRAVENOUS | Status: AC
  Administered 2023-11-22: 480 mg via INTRAVENOUS
  Filled 2023-11-22: qty 48

## 2023-11-22 MED ORDER — CETIRIZINE HCL 10 MG PO TABS
10.0000 mg | ORAL_TABLET | Freq: Every day | ORAL | Status: DC
  Administered 2023-11-22: 10 mg via ORAL
  Filled 2023-11-22: qty 1

## 2023-11-22 MED ORDER — SODIUM CHLORIDE 0.9% FLUSH
10.0000 mL | Freq: Once | INTRAVENOUS | Status: AC
Start: 2023-11-22 — End: 2023-11-22
  Administered 2023-11-22: 10 mL
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
## 2023-11-22 NOTE — Patient Instructions (Signed)
 [facility]  Discharge Instructions: Thank you for choosing [facility] to provide your oncology and hematology care.   If you have a lab appointment with the Cancer Center, please go directly to the Cancer Center and check in at the registration area.   Wear comfortable clothing and clothing appropriate for easy access to any Portacath or PICC line.   We strive to give you quality time with your provider. You may need to reschedule your appointment if you arrive late (15 or more minutes).  Arriving late affects you and other patients whose appointments are after yours.  Also, if you miss three or more appointments without notifying the office, you may be dismissed from the clinic at the provider's discretion.      For prescription refill requests, have your pharmacy contact our office and allow 72 hours for refills to be completed.    Today you received the following chemotherapy and/or immunotherapy agents nivolumab      To help prevent nausea and vomiting after your treatment, we encourage you to take your nausea medication as directed.  BELOW ARE SYMPTOMS THAT SHOULD BE REPORTED IMMEDIATELY: *FEVER GREATER THAN 100.4 F (38 C) OR HIGHER *CHILLS OR SWEATING *NAUSEA AND VOMITING THAT IS NOT CONTROLLED WITH YOUR NAUSEA MEDICATION *UNUSUAL SHORTNESS OF BREATH *UNUSUAL BRUISING OR BLEEDING *URINARY PROBLEMS (pain or burning when urinating, or frequent urination) *BOWEL PROBLEMS (unusual diarrhea, constipation, pain near the anus) TENDERNESS IN MOUTH AND THROAT WITH OR WITHOUT PRESENCE OF ULCERS (sore throat, sores in mouth, or a toothache) UNUSUAL RASH, SWELLING OR PAIN  UNUSUAL VAGINAL DISCHARGE OR ITCHING   Items with * indicate a potential emergency and should be followed up as soon as possible or go to the Emergency Department if any problems should occur.  Please show the CHEMOTHERAPY ALERT CARD or IMMUNOTHERAPY  ALERT CARD at check-in to the Emergency Department and triage nurse.  Should you have questions after your visit or need to cancel or reschedule your appointment, please contact [facility]  Dept: [phone]  and follow the prompts.  Office hours are 8:00 a.m. to 4:30 p.m. Monday - Friday. Please note that voicemails left after 4:00 p.m. may not be returned until the following business day.  We are closed weekends and major holidays. You have access to a nurse at all times for urgent questions. Please call the main number to the clinic Dept: [phone] and follow the prompts.   For any non-urgent questions, you may also contact your provider using MyChart. We now offer e-Visits for anyone 28 and older to request care online for non-urgent symptoms. For details visit [email or web address].   Also download the MyChart app! Go to the app store, search "MyChart", open the app, select [place], and log in with your MyChart username and password.

## 2023-11-23 ENCOUNTER — Other Ambulatory Visit: Payer: Self-pay

## 2023-11-23 ENCOUNTER — Encounter: Payer: Self-pay | Admitting: Hematology

## 2023-11-23 DIAGNOSIS — C8192 Hodgkin lymphoma, unspecified, intrathoracic lymph nodes: Secondary | ICD-10-CM

## 2023-11-23 MED ORDER — ONDANSETRON HCL 4 MG PO TABS: 4.0000 mg | ORAL_TABLET | Freq: Four times a day (QID) | ORAL | 0 refills | Status: DC | PRN

## 2023-11-23 MED ORDER — PROCHLORPERAZINE MALEATE 10 MG PO TABS: 10.0000 mg | ORAL_TABLET | Freq: Four times a day (QID) | ORAL | 1 refills | Status: DC | PRN

## 2023-11-29 ENCOUNTER — Encounter: Payer: Self-pay | Admitting: Hematology

## 2023-12-02 ENCOUNTER — Other Ambulatory Visit: Payer: Self-pay | Admitting: Hematology

## 2023-12-02 ENCOUNTER — Encounter (HOSPITAL_COMMUNITY)
Admission: RE | Admit: 2023-12-02 | Discharge: 2023-12-02 | Disposition: A | Discharge status: Home / Self Care | Payer: Medicare HMO | Source: Ambulatory Visit | Attending: Hematology | Admitting: Hematology

## 2023-12-02 DIAGNOSIS — C8192 Hodgkin lymphoma, unspecified, intrathoracic lymph nodes: Secondary | ICD-10-CM | POA: Insufficient documentation

## 2023-12-02 LAB — GLUCOSE, CAPILLARY: Glucose-Capillary: 92 mg/dL (ref 70–99)

## 2023-12-02 MED ORDER — FLUDEOXYGLUCOSE F - 18 (FDG) INJECTION
12.5500 | Freq: Once | INTRAVENOUS | Status: AC
  Administered 2023-12-02: 12.57 via INTRAVENOUS

## 2023-12-10 ENCOUNTER — Encounter: Payer: Self-pay | Admitting: Hematology

## 2023-12-15 ENCOUNTER — Ambulatory Visit: Payer: Medicare HMO | Admitting: Hematology

## 2023-12-15 ENCOUNTER — Ambulatory Visit: Payer: Medicare HMO

## 2023-12-15 ENCOUNTER — Other Ambulatory Visit: Payer: Medicare HMO

## 2023-12-22 ENCOUNTER — Other Ambulatory Visit (HOSPITAL_COMMUNITY): Payer: Self-pay

## 2023-12-22 ENCOUNTER — Inpatient Hospital Stay: Payer: Self-pay | Attending: Hematology

## 2023-12-22 ENCOUNTER — Inpatient Hospital Stay (HOSPITAL_BASED_OUTPATIENT_CLINIC_OR_DEPARTMENT_OTHER): Payer: Self-pay | Admitting: Physician Assistant

## 2023-12-22 ENCOUNTER — Encounter: Payer: Self-pay | Admitting: Hematology

## 2023-12-22 ENCOUNTER — Inpatient Hospital Stay: Payer: Self-pay

## 2023-12-22 VITALS — BP 125/72 | HR 67 | Temp 97.7°F | Resp 18 | Ht 69.0 in | Wt 256.1 lb

## 2023-12-22 DIAGNOSIS — R5383 Other fatigue: Secondary | ICD-10-CM

## 2023-12-22 DIAGNOSIS — Z79620 Long term (current) use of immunosuppressive biologic: Secondary | ICD-10-CM | POA: Insufficient documentation

## 2023-12-22 DIAGNOSIS — C8192 Hodgkin lymphoma, unspecified, intrathoracic lymph nodes: Secondary | ICD-10-CM

## 2023-12-22 DIAGNOSIS — C8179 Other classical Hodgkin lymphoma, extranodal and solid organ sites: Secondary | ICD-10-CM | POA: Diagnosis present

## 2023-12-22 DIAGNOSIS — S61452A Open bite of left hand, initial encounter: Secondary | ICD-10-CM | POA: Diagnosis not present

## 2023-12-22 DIAGNOSIS — T148XXA Other injury of unspecified body region, initial encounter: Secondary | ICD-10-CM

## 2023-12-22 DIAGNOSIS — Z5112 Encounter for antineoplastic immunotherapy: Secondary | ICD-10-CM | POA: Insufficient documentation

## 2023-12-22 DIAGNOSIS — E559 Vitamin D deficiency, unspecified: Secondary | ICD-10-CM | POA: Diagnosis not present

## 2023-12-22 LAB — CBC WITH DIFFERENTIAL (CANCER CENTER ONLY)
Abs Immature Granulocytes: 0.01 10*3/uL (ref 0.00–0.07)
Basophils Absolute: 0 10*3/uL (ref 0.0–0.1)
Basophils Relative: 1 %
Eosinophils Absolute: 0.3 10*3/uL (ref 0.0–0.5)
Eosinophils Relative: 6 %
HCT: 38.8 % — ABNORMAL LOW (ref 39.0–52.0)
Hemoglobin: 13.6 g/dL (ref 13.0–17.0)
Immature Granulocytes: 0 %
Lymphocytes Relative: 42 %
Lymphs Abs: 1.8 10*3/uL (ref 0.7–4.0)
MCH: 30.4 pg (ref 26.0–34.0)
MCHC: 35.1 g/dL (ref 30.0–36.0)
MCV: 86.8 fL (ref 80.0–100.0)
Monocytes Absolute: 0.4 10*3/uL (ref 0.1–1.0)
Monocytes Relative: 11 %
Neutro Abs: 1.7 10*3/uL (ref 1.7–7.7)
Neutrophils Relative %: 40 %
Platelet Count: 213 10*3/uL (ref 150–400)
RBC: 4.47 MIL/uL (ref 4.22–5.81)
RDW: 11.9 % (ref 11.5–15.5)
WBC Count: 4.2 10*3/uL (ref 4.0–10.5)
nRBC: 0 % (ref 0.0–0.2)

## 2023-12-22 LAB — CMP (CANCER CENTER ONLY)
ALT: 28 U/L (ref 0–44)
AST: 25 U/L (ref 15–41)
Albumin: 4.3 g/dL (ref 3.5–5.0)
Alkaline Phosphatase: 102 U/L (ref 38–126)
Anion gap: 3 — ABNORMAL LOW (ref 5–15)
BUN: 12 mg/dL (ref 6–20)
CO2: 30 mmol/L (ref 22–32)
Calcium: 9.5 mg/dL (ref 8.9–10.3)
Chloride: 105 mmol/L (ref 98–111)
Creatinine: 0.81 mg/dL (ref 0.61–1.24)
GFR, Estimated: >60 mL/min (ref >60)
Glucose, Bld: 102 mg/dL — ABNORMAL HIGH (ref 70–99)
Potassium: 3.6 mmol/L (ref 3.5–5.1)
Sodium: 138 mmol/L (ref 135–145)
Total Bilirubin: 0.3 mg/dL (ref 0.0–1.2)
Total Protein: 7 g/dL (ref 6.5–8.1)

## 2023-12-22 LAB — TSH: TSH: 1.144 u[IU]/mL (ref 0.350–4.500)

## 2023-12-22 MED ORDER — AMOXICILLIN-POT CLAVULANATE 875-125 MG PO TABS
1.0000 | ORAL_TABLET | Freq: Two times a day (BID) | ORAL | 0 refills | Status: DC
  Filled 2023-12-22: qty 14, 7d supply, fill #0

## 2023-12-22 MED ORDER — SODIUM CHLORIDE 0.9 % IV SOLN
480.0000 mg | Freq: Once | INTRAVENOUS | Status: AC
  Administered 2023-12-22: 480 mg via INTRAVENOUS
  Filled 2023-12-22: qty 48

## 2023-12-22 MED ORDER — HEPARIN SOD (PORK) LOCK FLUSH 100 UNIT/ML IV SOLN
500.0000 [IU] | Freq: Once | INTRAVENOUS | Status: AC | PRN
Start: 2023-12-22 — End: 2023-12-22
  Administered 2023-12-22: 500 [IU]

## 2023-12-22 MED ORDER — CETIRIZINE HCL 10 MG PO TABS
10.0000 mg | ORAL_TABLET | Freq: Once | ORAL | Status: AC
  Administered 2023-12-22: 10 mg via ORAL
  Filled 2023-12-22: qty 1

## 2023-12-22 MED ORDER — ACETAMINOPHEN 325 MG PO TABS
650.0000 mg | ORAL_TABLET | Freq: Once | ORAL | Status: AC
  Administered 2023-12-22: 650 mg via ORAL
  Filled 2023-12-22: qty 2

## 2023-12-22 MED ORDER — SODIUM CHLORIDE 0.9% FLUSH
10.0000 mL | INTRAVENOUS | Status: DC | PRN
  Administered 2023-12-22: 10 mL

## 2023-12-22 MED ORDER — SODIUM CHLORIDE 0.9 % IV SOLN: Freq: Once | INTRAVENOUS | Status: AC

## 2023-12-22 NOTE — Progress Notes (Signed)
 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 Held Bleomycine starting Cycle 4. Completed 6 cycles on 02/03/2023.   CURRENT TREATMENT: --Second line therapy with Adcentris plus Nivolumab started on 03/24/2023. Held adcentris starting with Cycle 2 due to neuropathy.   Interval History:  Paul [name] is a 33 year old male who is here for continued evaluation and management of classical hogkins lymphoma. He was last seen by Dr. Salomon Cree on 11/22/2023. He presents today prior to Cycle 11, Day 1.   Mr. Novello reports that he is having fatigue but can complete his ADLs on his own. He has gained weight in the last few months without changing his diet or activity.  He denies nausea, vomiting or bowel habit changes. He denies easy bruising or signs of active bleeding. He reports that his cat bit his left hand. His mom has bandaged the wound and has applied topical antibiotic cream. He denies fevers, chills, sweats, shortness of breath, chest pain or cough. He has no other complaints.     MEDICAL HISTORY:  Past Medical History:  Diagnosis Date   ADD (attention deficit disorder)    ADHD (attention deficit hyperactivity disorder)    Asthma    excercise induced and pollen   Auditory processing disorder    Complication of anesthesia    mother states pt. is harder to put under anes. and hard to wake up due to fetal alcohol syndrome; can be combative, per mother; states he will do better if mother is in PACU when he is coming out of anes.   Development delay    states mental status of a 33 year old   Exercise-induced asthma    prn inhaler/neb.   Fetal alcohol syndrome    Narcolepsy     Non-restorable tooth 09/2015   teeth   Separation anxiety    Twin birth     SURGICAL HISTORY: Past Surgical History:  Procedure Laterality Date   IR IMAGING GUIDED PORT INSERTION  08/11/2022   MULTIPLE EXTRACTIONS WITH ALVEOLOPLASTY N/A 09/23/2015   Procedure: MULTIPLE EXTRACTION WITH ALVEOLOPLASTY;  Surgeon: Ascencion Lava, DDS;  Location: [facility];  Service: Oral Surgery;  Laterality: N/A;   PYLOROMYOTOMY     age [age], performed found to not have stenosis on surgical exam   PYLOROMYOTOMY     did not have pyloric stenosis; did surgery on the wrong twin   RADIOLOGY WITH ANESTHESIA N/A 01/16/2020   Procedure: MRI WITH ANESTHESIA  BRAIN WITH AND WITHOUT CONTRAST;  Surgeon: Radiologist, Medication, MD;  Location: [facility] OR;  Service: Radiology;  Laterality: N/A;    SOCIAL HISTORY: Social History   Socioeconomic History   Marital status: Single    Spouse name: Not on file   Number of children: Not on file   Years of education: Not on file   Highest education level: Not on file  Occupational History   Not on file  Tobacco Use   Smoking status: Never   Smokeless tobacco: Never  Vaping Use   Vaping status: Never Used  Substance and Sexual Activity   Alcohol use: No   Drug use: No   Sexual activity: Not on file  Other Topics Concern   Not on file  Social History  Narrative   ** Merged History Encounter **       Social Drivers of Health   Financial Resource Strain: Low Risk  (04/09/2021)   Received from [facility] visits prior to 11/07/2022., [facility] visits prior to 11/07/2022.   Overall Financial Resource Strain (CARDIA)    Difficulty of Paying Living Expenses: Not hard at all  Food Insecurity: Low Risk  (07/01/2023)   [facility]   Hunger Vital Sign    Worried About Running Out of Food in the Last Year: Never true    Ran Out of Food in the Last Year: Never true  Transportation Needs: No Transportation  Needs (07/01/2023)   Received from [employer]    In the past 12 months, has lack of reliable transportation kept you from medical appointments, meetings, work or from getting things needed for daily living? : No  Physical Activity: Insufficiently Active (04/09/2021)   Received from [facility] visits prior to 11/07/2022., [facility] visits prior to 11/07/2022.   Exercise Vital Sign    Days of Exercise per Week: 5 days    Minutes of Exercise per Session: 10 min  Stress: No Stress Concern Present (04/09/2021)   Received from [facility] visits prior to 11/07/2022., [facility] visits prior to 11/07/2022.   [employer] of Occupational Health - Occupational Stress Questionnaire    Feeling of Stress : Only a little  Social Connections: Socially Isolated (04/09/2021)   Received from [facility] visits prior to 11/07/2022., [facility] visits prior to 11/07/2022.   Social Advertising account executive [NHANES]    Frequency of Communication with Friends and Family: Never    Frequency of Social Gatherings with Friends and Family: Once a week    Attends Religious Services: Never    Database administrator or Organizations: Yes    Attends Engineer, structural: More than 4 times per year    Marital Status: Never married  Intimate Partner Violence: Not At Risk (05/13/2023)   Humiliation, Afraid, Rape, and Kick questionnaire    Fear of Current or Ex-Partner: No    Emotionally Abused: No    Physically Abused: No    Sexually Abused: No    FAMILY HISTORY: Family History  Adopted: Yes  Problem Relation Age of Onset   Mental illness Mother    Diabetes Mother    Hypertension Mother    Schizophrenia Mother    Bipolar disorder Mother    Mental retardation Father    Sleep apnea Neg Hx     ALLERGIES:  is allergic to gabapentin, modafinil, and  other.  MEDICATIONS:  Current Outpatient Medications  Medication Sig Dispense Refill   albuterol (PROVENTIL HFA;VENTOLIN HFA) 108 (90 BASE) MCG/ACT inhaler Inhale 2 puffs into the lungs every 6 (six) hours as needed for wheezing or shortness of breath.     albuterol (PROVENTIL) (5 MG/ML) 0.5% nebulizer solution Take 2.5 mg by nebulization every 6 (six) hours as needed for wheezing or shortness of breath.     amoxicillin-clavulanate (AUGMENTIN) 875-125 MG tablet Take 1 tablet by mouth 2 (two) times daily. 14 tablet 0   guaiFENesin (ROBITUSSIN) 100 MG/5ML liquid Take 5 mLs by mouth every 4 (four) hours as needed for cough or to loosen phlegm. 120 mL 0   ibuprofen (ADVIL) 800 MG tablet Take 800  mg by mouth every 8 (eight) hours as needed for mild pain or headache.     modafinil (PROVIGIL) 200 MG tablet Take 2 tablets by mouth every morning.     Multiple Vitamin (MULTIVITAMIN WITH MINERALS) TABS tablet Take 1 tablet by mouth daily with breakfast.     ondansetron (ZOFRAN) 4 MG tablet Take 1 tablet (4 mg total) by mouth every 6 (six) hours as needed for nausea. 20 tablet 0   Oxcarbazepine (TRILEPTAL) 300 MG tablet Take 300 mg by mouth daily.     prochlorperazine (COMPAZINE) 10 MG tablet Take 1 tablet (10 mg total) by mouth every 6 (six) hours as needed for nausea or vomiting. 30 tablet 1   Vitamin D, Ergocalciferol, (DRISDOL) 1.25 MG (50000 UNIT) CAPS capsule Take 50,000 Units by mouth every Monday.     No current facility-administered medications for this visit.    REVIEW OF SYSTEMS:    10 Point review of Systems was done is negative except as noted above.   PHYSICAL EXAMINATION  BP 125/72 (BP Location: Right Arm, Patient Position: Sitting)   Pulse 67   Temp 97.7 F (36.5 C) (Tympanic)   Resp 18   Ht 5\' 9"  (1.753 m)   Wt 256 lb 1.6 oz (116.2 kg)   SpO2 100%   BMI 37.82 kg/m  GENERAL:alert, in no acute distress and comfortable SKIN: no acute rashes, no significant lesions EYES:  conjunctiva are pink and non-injected, sclera anicteric LUNGS: clear to auscultation b/l with normal respiratory effort HEART: regular rate & rhythm Extremity: no pedal edema. Puncture wound involving left hand with surround erythema and discharge.  PSYCH: alert & oriented x 3 with fluent speech NEURO: no focal motor/sensory deficits   LABORATORY DATA:  I have reviewed the data as listed  .    Latest Ref Rng & Units 12/22/2023   11:00 AM 11/22/2023   12:23 PM 10/25/2023   12:08 PM  CBC  WBC 4.0 - 10.5 K/uL 4.2  5.9  5.6   Hemoglobin 13.0 - 17.0 g/dL 16.1  09.6  04.5   Hematocrit 39.0 - 52.0 % 38.8  39.2  39.6   Platelets 150 - 400 K/uL 213  219  235    ANC 300  .    Latest Ref Rng & Units 12/22/2023   11:00 AM 11/22/2023   12:23 PM 10/25/2023   12:08 PM  CMP  Glucose 70 - 99 mg/dL 409  811  914   BUN 6 - 20 mg/dL 12  15  14    Creatinine 0.61 - 1.24 mg/dL 7.82  9.56  2.13   Sodium 135 - 145 mmol/L 138  140  138   Potassium 3.5 - 5.1 mmol/L 3.6  3.5  3.9   Chloride 98 - 111 mmol/L 105  105  104   CO2 22 - 32 mmol/L 30  30  30    Calcium 8.9 - 10.3 mg/dL 9.5  8.9  9.3   Total Protein 6.5 - 8.1 g/dL 7.0  6.8  6.6   Total Bilirubin 0.0 - 1.2 mg/dL 0.3  0.3  0.3   Alkaline Phos 38 - 126 U/L 102  109  127   AST 15 - 41 U/L 25  22  28    ALT 0 - 44 U/L 28  27  31     . Lab Results  Component Value Date   LDH 171 07/03/2022   RADIOGRAPHIC STUDIES: I have personally reviewed the radiological images as listed and agreed  with the findings in the report. NM PET Image Restag (PS) Skull Base To Thigh Result Date: 12/17/2023 CLINICAL DATA:  Subsequent treatment strategy for Hodgkin's lymphoma. Chemotherapy last year. Subsequent immunotherapy. EXAM: NUCLEAR MEDICINE PET SKULL BASE TO THIGH TECHNIQUE: 12.6 mCi F-18 FDG was injected intravenously. Full-ring PET imaging was performed from the skull base to thigh after the radiotracer. CT data was obtained and used for attenuation correction  and anatomic localization. Fasting blood glucose: 92 mg/dl COMPARISON:  16/06/9603 FINDINGS: Mediastinal blood pool activity: SUV max 2.3 Liver activity: SUV max 2.9 NECK: No areas of abnormal hypermetabolism. Incidental CT findings: Right maxillary sinus mucous retention cyst or polyp. No cervical adenopathy. CHEST: Anterior mediastinal soft tissue thickening with low-level activity, primarily similar to mediastinal pool. Maximally a S.U.V. max of 2.8 today anterior to the SVC, including on 70/4. Compare 3.5 cm at the same level on the prior. In the left paramidline anterior mediastinum measures 12 mm thickness on 64/14 versus 13 mm previously (when remeasured). Anterior to the SVC, at the area of greatest activity, measures 2.4 cm thickness on 70/4 today versus 2.6 cm thickness at a similar level on the prior (when remeasured). Incidental CT findings: No new thoracic adenopathy. Right Port-A-Cath tip mid right atrium. ABDOMEN/PELVIS: No abdominopelvic nodal or splenic hypermetabolism. Incidental CT findings: Normal adrenal glands. Interpolar right renal low-density lesion of 2.7 cm is likely a cyst . In the absence of clinically indicated signs/symptoms require(s) no independent follow-up. No splenomegaly. No abdominopelvic adenopathy. SKELETON: No abnormal marrow activity. Incidental CT findings: None. IMPRESSION: Anterior mediastinal adenopathy is minimally decreased in thickness since 07/29/2023 with low-level activity, slightly decreased. Favored to represent treated lymphoma. No findings to strongly suggest residual viable disease. ([place]) 2 Electronically Signed   By: Lore Rode M.D.   On: 12/17/2023 13:45    Surgical Pathology result from 07/03/2022: FINAL MICROSCOPIC DIAGNOSIS:  A. MEDIASTINAL MASS, ANTERIOR, NEEDLE CORE BIOPSY: -Atypical lymphoid proliferation consistent with classical Hodgkin lymphoma -See comment  COMMENT:  The sections show needle core biopsy fragments displaying a  nodular and diffuse lymphoid proliferation associated with dense sclerosis.  The lymphoid process shows predominance of small lymphoid cells admixed to a lesser extent with histiocytes, eosinophils in addition to the large atypical mononuclear and occasionally lobated lymphoid appearing cells with variably prominent nucleoli.  Flow cytometric analysis was performed [facility] [id number]) and shows predominance of CD4-positive T cells. No monoclonal B-cell population identified.  In addition, a battery of immunohistochemical stains was performed including CD30, CD15, mum 1, LCA, CD20, PAX5, CD3, CD5, CD4, CD8, TdT, cytokeratin AE1/AE3, cytokeratin 8/18 and EBV in situ hybridization with appropriate controls.  The large atypical lymphoid appearing cells are positive for CD15, CD30, PAX5, mum 1 and rare cells for CD20.  No significant staining is seen with LCA, EBV, CD3, CD5, CD4, CD8.  Cytokeratin stains highlight a small focus of positivity likely representing native epithelial elements.  No significant TdT positivity is identified.  The small lymphocytes in the background show a mixture of T and B cells with predominance of T cells.  The latter show predominance of CD4 positive cells.  Overall, the features are atypical and most consistent with classical Hodgkin lymphoma which is best subclassified as nodular sclerosis type.   ASSESSMENT & PLAN:  Paul [name] is a l.age   # Stage II bulky Classical Hodgkins lymphoma -Presented as a large mediastinal mass causing some compression of his SVC and left brachiocephalic vein but no complete obstruction or symptoms related  to this.No constitutional symptoms. -Started systemic chemotherapy with ABVD on 08/12/2022.  --Mid treatment PET from 10/19/2022 was reviewed and shows decrease in size and FDG avidity of dominant anterior mediastinal mass and additional nodes.  --Held Bleomycin starting Cycle 4. Completed 6 cycles on 02/03/2023.  --Most treatment  PET scan from 02/25/2023 showed interval increase in size and metabolic activity of right anterior mediastinal mass consistent with recurrence.  --Patient was seen at [facility] for transplant consideration and patient's parents declined consideration of AutoHSCT since patient would not have home support to handle this treatment. --Patient started second therapy with Adcentris plus Nivolumab started on 03/24/2023. Held Adcentris starting with Cycle 2 due to severe neuropathic pain. PLAN: --Due for Cycle 11, Day 1 of Nivolumab today --Labs from today were reviewed and adequate for treatment. WBC 4.2,  Hgb 13.6, Plt 212, creatinine and LFTs normal. --PET scan from 12/02/2023 shows treatment response with no signs to suggest any residual disease --Proceed with treatment today without any dose modifications. .    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  #5 Cat bite: --Starting to have some discharge.  --will send 7 day course of Augmentin BID.   #Fatigue/weight gain: --will check vitamin D and thyroid levels.  FOLLOW-UP: --RTC in 4 weeks with labs and follow up before Cycle 12, Day 1.     All of the patient's questions were answered with apparent satisfaction. The patient knows to call the clinic with any problems, questions or concerns.   I have spent a total of 30 minutes minutes of face-to-face and non-face-to-face time, preparing to see the patient, performing a medically appropriate examination, counseling and educating the patient, ordering medications, documenting clinical information in the electronic health record, independently interpreting results and communicating results to the patient, and care coordination.   Wyline Hearing PA-C Dept of Hematology and Oncology [facility] Phone: [phone]\

## 2023-12-22 NOTE — Patient Instructions (Signed)

## 2023-12-22 NOTE — Patient Instructions (Signed)
 [facility] - A DEPT OF [facility]  Discharge Instructions: Thank you for choosing [facility] to provide your oncology and hematology care.   If you have a lab appointment with the Cancer Center, please go directly to the Cancer Center and check in at the registration area.   Wear comfortable clothing and clothing appropriate for easy access to any Portacath or PICC line.   We strive to give you quality time with your provider. You may need to reschedule your appointment if you arrive late (15 or more minutes).  Arriving late affects you and other patients whose appointments are after yours.  Also, if you miss three or more appointments without notifying the office, you may be dismissed from the clinic at the provider's discretion.      For prescription refill requests, have your pharmacy contact our office and allow 72 hours for refills to be completed.    Today you received the following chemotherapy and/or immunotherapy agents: Opdivo      To help prevent nausea and vomiting after your treatment, we encourage you to take your nausea medication as directed.  BELOW ARE SYMPTOMS THAT SHOULD BE REPORTED IMMEDIATELY: *FEVER GREATER THAN 100.4 F (38 C) OR HIGHER *CHILLS OR SWEATING *NAUSEA AND VOMITING THAT IS NOT CONTROLLED WITH YOUR NAUSEA MEDICATION *UNUSUAL SHORTNESS OF BREATH *UNUSUAL BRUISING OR BLEEDING *URINARY PROBLEMS (pain or burning when urinating, or frequent urination) *BOWEL PROBLEMS (unusual diarrhea, constipation, pain near the anus) TENDERNESS IN MOUTH AND THROAT WITH OR WITHOUT PRESENCE OF ULCERS (sore throat, sores in mouth, or a toothache) UNUSUAL RASH, SWELLING OR PAIN  UNUSUAL VAGINAL DISCHARGE OR ITCHING   Items with * indicate a potential emergency and should be followed up as soon as possible or go to the Emergency Department if any problems should occur.  Please show the CHEMOTHERAPY ALERT CARD or IMMUNOTHERAPY  ALERT CARD at check-in to the Emergency Department and triage nurse.  Should you have questions after your visit or need to cancel or reschedule your appointment, please contact [facility] - A DEPT OF [facility]  Dept: [phone]  and follow the prompts.  Office hours are 8:00 a.m. to 4:30 p.m. Monday - Friday. Please note that voicemails left after 4:00 p.m. may not be returned until the following business day.  We are closed weekends and major holidays. You have access to a nurse at all times for urgent questions. Please call the main number to the clinic Dept: [phone] and follow the prompts.   For any non-urgent questions, you may also contact your provider using MyChart. We now offer e-Visits for anyone 57 and older to request care online for non-urgent symptoms. For details visit [email or web address].   Also download the MyChart app! Go to the app store, search "MyChart", open the app, select , and log in with your MyChart username and password.

## 2023-12-23 ENCOUNTER — Encounter: Payer: Self-pay | Admitting: Neurology

## 2023-12-23 ENCOUNTER — Encounter: Payer: Self-pay | Admitting: Physician Assistant

## 2023-12-23 LAB — MISC LABCORP TEST (SEND OUT): Labcorp test code: 81950

## 2023-12-23 LAB — T4: T4, Total: 5.2 ug/dL (ref 4.5–12.0)

## 2023-12-23 NOTE — Telephone Encounter (Signed)
 Settings of the CPAP look fine, I recommend he continue the current CPAP pressure but make an appointment with the DME provider to get a replacement mask or a different mask as the leak from the mask is higher.  Please remind patient to be consistent with CPAP therapy.

## 2023-12-23 NOTE — Telephone Encounter (Signed)
 Paul [name]

## 2024-01-03 ENCOUNTER — Other Ambulatory Visit: Payer: Self-pay

## 2024-01-03 ENCOUNTER — Emergency Department (HOSPITAL_COMMUNITY)

## 2024-01-03 ENCOUNTER — Inpatient Hospital Stay (HOSPITAL_COMMUNITY)
Admission: EM | Admit: 2024-01-03 | Discharge: 2024-01-06 | DRG: 871 | Disposition: A | Discharge status: Home / Self Care | Attending: Internal Medicine | Admitting: Internal Medicine

## 2024-01-03 DIAGNOSIS — Z1152 Encounter for screening for COVID-19: Secondary | ICD-10-CM

## 2024-01-03 DIAGNOSIS — N179 Acute kidney failure, unspecified: Secondary | ICD-10-CM | POA: Diagnosis not present

## 2024-01-03 DIAGNOSIS — E872 Acidosis, unspecified: Secondary | ICD-10-CM | POA: Diagnosis present

## 2024-01-03 DIAGNOSIS — C817A Other Hodgkin lymphoma, in remission: Secondary | ICD-10-CM | POA: Diagnosis present

## 2024-01-03 DIAGNOSIS — R6520 Severe sepsis without septic shock: Secondary | ICD-10-CM | POA: Insufficient documentation

## 2024-01-03 DIAGNOSIS — Q860 Fetal alcohol syndrome (dysmorphic): Secondary | ICD-10-CM

## 2024-01-03 DIAGNOSIS — I3139 Other pericardial effusion (noninflammatory): Secondary | ICD-10-CM | POA: Diagnosis present

## 2024-01-03 DIAGNOSIS — A419 Sepsis, unspecified organism: Secondary | ICD-10-CM | POA: Diagnosis not present

## 2024-01-03 DIAGNOSIS — E871 Hypo-osmolality and hyponatremia: Secondary | ICD-10-CM | POA: Diagnosis present

## 2024-01-03 DIAGNOSIS — Z888 Allergy status to other drugs, medicaments and biological substances status: Secondary | ICD-10-CM

## 2024-01-03 DIAGNOSIS — Z8249 Family history of ischemic heart disease and other diseases of the circulatory system: Secondary | ICD-10-CM

## 2024-01-03 DIAGNOSIS — E860 Dehydration: Secondary | ICD-10-CM | POA: Diagnosis present

## 2024-01-03 DIAGNOSIS — J189 Pneumonia, unspecified organism: Principal | ICD-10-CM

## 2024-01-03 DIAGNOSIS — E861 Hypovolemia: Secondary | ICD-10-CM | POA: Diagnosis present

## 2024-01-03 DIAGNOSIS — J9601 Acute respiratory failure with hypoxia: Secondary | ICD-10-CM

## 2024-01-03 DIAGNOSIS — Z810 Family history of intellectual disabilities: Secondary | ICD-10-CM

## 2024-01-03 DIAGNOSIS — G4733 Obstructive sleep apnea (adult) (pediatric): Secondary | ICD-10-CM | POA: Diagnosis present

## 2024-01-03 DIAGNOSIS — R6250 Unspecified lack of expected normal physiological development in childhood: Secondary | ICD-10-CM | POA: Diagnosis present

## 2024-01-03 DIAGNOSIS — F909 Attention-deficit hyperactivity disorder, unspecified type: Secondary | ICD-10-CM | POA: Diagnosis present

## 2024-01-03 DIAGNOSIS — J188 Other pneumonia, unspecified organism: Secondary | ICD-10-CM | POA: Diagnosis present

## 2024-01-03 DIAGNOSIS — Z79899 Other long term (current) drug therapy: Secondary | ICD-10-CM

## 2024-01-03 DIAGNOSIS — E876 Hypokalemia: Secondary | ICD-10-CM

## 2024-01-03 LAB — COMPREHENSIVE METABOLIC PANEL WITH GFR
ALT: 30 U/L (ref 0–44)
AST: 33 U/L (ref 15–41)
Albumin: 3.7 g/dL (ref 3.5–5.0)
Alkaline Phosphatase: 88 U/L (ref 38–126)
Anion gap: 11 (ref 5–15)
BUN: 18 mg/dL (ref 6–20)
CO2: 24 mmol/L (ref 22–32)
Calcium: 8.2 mg/dL — ABNORMAL LOW (ref 8.9–10.3)
Chloride: 98 mmol/L (ref 98–111)
Creatinine, Ser: 1.42 mg/dL — ABNORMAL HIGH (ref 0.61–1.24)
GFR, Estimated: >60 mL/min (ref >60)
Glucose, Bld: 166 mg/dL — ABNORMAL HIGH (ref 70–99)
Potassium: 2.8 mmol/L — ABNORMAL LOW (ref 3.5–5.1)
Sodium: 133 mmol/L — ABNORMAL LOW (ref 135–145)
Total Bilirubin: 0.6 mg/dL (ref 0.0–1.2)
Total Protein: 6.9 g/dL (ref 6.5–8.1)

## 2024-01-03 LAB — CBC WITH DIFFERENTIAL/PLATELET
Abs Immature Granulocytes: 0.01 10*3/uL (ref 0.00–0.07)
Basophils Absolute: 0 10*3/uL (ref 0.0–0.1)
Basophils Relative: 0 %
Eosinophils Absolute: 0 10*3/uL (ref 0.0–0.5)
Eosinophils Relative: 0 %
HCT: 40.6 % (ref 39.0–52.0)
Hemoglobin: 13.3 g/dL (ref 13.0–17.0)
Immature Granulocytes: 0 %
Lymphocytes Relative: 26 %
Lymphs Abs: 1.2 10*3/uL (ref 0.7–4.0)
MCH: 30.5 pg (ref 26.0–34.0)
MCHC: 32.8 g/dL (ref 30.0–36.0)
MCV: 93.1 fL (ref 80.0–100.0)
Monocytes Absolute: 0.6 10*3/uL (ref 0.1–1.0)
Monocytes Relative: 12 %
Neutro Abs: 2.8 10*3/uL (ref 1.7–7.7)
Neutrophils Relative %: 62 %
Platelets: 150 10*3/uL (ref 150–400)
RBC: 4.36 MIL/uL (ref 4.22–5.81)
RDW: 12.3 % (ref 11.5–15.5)
Smear Review: NORMAL
WBC Morphology: >10
WBC: 4.5 10*3/uL (ref 4.0–10.5)
nRBC: 0 % (ref 0.0–0.2)

## 2024-01-03 LAB — I-STAT CG4 LACTIC ACID, ED
Lactic Acid, Venous: 3 mmol/L (ref 0.5–1.9)
Lactic Acid, Venous: 3.4 mmol/L (ref 0.5–1.9)

## 2024-01-03 MED ORDER — LIDOCAINE HCL URETHRAL/MUCOSAL 2 % EX GEL
1.0000 | Freq: Once | CUTANEOUS | Status: AC
  Administered 2024-01-03: 1 via TOPICAL
  Filled 2024-01-03: qty 11

## 2024-01-03 MED ORDER — LACTATED RINGERS IV BOLUS
1000.0000 mL | Freq: Once | INTRAVENOUS | Status: AC
  Administered 2024-01-03: 1000 mL via INTRAVENOUS

## 2024-01-03 MED ORDER — IOHEXOL 350 MG/ML SOLN
75.0000 mL | Freq: Once | INTRAVENOUS | Status: AC | PRN
  Administered 2024-01-03: 75 mL via INTRAVENOUS

## 2024-01-03 MED ORDER — METHYLPREDNISOLONE SODIUM SUCC 125 MG IJ SOLR
125.0000 mg | Freq: Once | INTRAMUSCULAR | Status: AC
  Administered 2024-01-03: 125 mg via INTRAVENOUS
  Filled 2024-01-03: qty 2

## 2024-01-03 MED ORDER — SODIUM CHLORIDE 0.9 % IV SOLN
1.0000 g | Freq: Once | INTRAVENOUS | Status: AC
  Administered 2024-01-03: 1 g via INTRAVENOUS
  Filled 2024-01-03: qty 10

## 2024-01-03 MED ORDER — ACETAMINOPHEN 500 MG PO TABS
1000.0000 mg | ORAL_TABLET | Freq: Once | ORAL | Status: AC
  Administered 2024-01-03: 1000 mg via ORAL
  Filled 2024-01-03: qty 2

## 2024-01-03 MED ORDER — IPRATROPIUM-ALBUTEROL 0.5-2.5 (3) MG/3ML IN SOLN
3.0000 mL | Freq: Once | RESPIRATORY_TRACT | Status: AC
  Administered 2024-01-03: 3 mL via RESPIRATORY_TRACT
  Filled 2024-01-03: qty 3

## 2024-01-03 MED ORDER — LACTATED RINGERS IV SOLN: INTRAVENOUS | Status: DC

## 2024-01-03 MED ORDER — SODIUM CHLORIDE 0.9 % IV SOLN
500.0000 mg | Freq: Once | INTRAVENOUS | Status: AC
  Administered 2024-01-03: 500 mg via INTRAVENOUS
  Filled 2024-01-03: qty 5

## 2024-01-03 NOTE — ED Notes (Signed)
 Pts O2 at 86% on RA with a good pleth. Pt placed on 4l [place] and is now 91% O2.

## 2024-01-03 NOTE — ED Triage Notes (Signed)
 Pt BIBA from UC. C/o SOB for 3x days- was 88 on RA at office. Given a duo-neb by ems. Did have 1 episode of emesis the AM  Mild improvement after neb treatment.  AOx4

## 2024-01-03 NOTE — ED Provider Notes (Signed)
 [place] EMERGENCY DEPARTMENT AT [facility] Provider Note   CSN: [id number] Arrival date & time: 01/03/24  1750     History  Chief Complaint  Patient presents with   Shortness of Breath    DELOYD [name] is a 33 y.o. male with PMHx developmental delay d/t fetal alcohol syndrome, ADHD, asthma, hodgkin lymphoma on immunotherapy infusions who presents to ED concerned for dry cough and SOB progressing over the past 3 days. Patient did also have an episode of vomiting this morning. Patient also endorses a fever at his PCP office earlier today. PCP sent patient to ED given 88% O2 sat on RA.  Legal Guardian stating that the whole household has been sick with URI this past week.  Last immunotherapy infusion 12/22/2023. Patient stating that he is in remission but needs to continue infusions for the rest of the year.    Shortness of Breath      Home Medications Prior to Admission medications   Medication Sig Start Date End Date Taking? Authorizing Provider  albuterol  (PROVENTIL  HFA;VENTOLIN  HFA) 108 (90 BASE) MCG/ACT inhaler Inhale 2 puffs into the lungs every 6 (six) hours as needed for wheezing or shortness of breath.    [provider]  albuterol  (PROVENTIL ) (5 MG/ML) 0.5% nebulizer solution Take 2.5 mg by nebulization every 6 (six) hours as needed for wheezing or shortness of breath.    [provider]  amoxicillin -clavulanate (AUGMENTIN ) 875-125 MG tablet Take 1 tablet by mouth 2 (two) times daily. 12/22/23   Thayil, Irene T, PA-C  guaiFENesin  (ROBITUSSIN) 100 MG/5ML liquid Take 5 mLs by mouth every 4 (four) hours as needed for cough or to loosen phlegm. 05/16/23   Barbee Lew, MD  ibuprofen  (ADVIL ) 800 MG tablet Take 800 mg by mouth every 8 (eight) hours as needed for mild pain or headache.    [provider]  modafinil (PROVIGIL) 200 MG tablet Take 2 tablets by mouth every morning. 10/29/23   [provider]  Multiple Vitamin  (MULTIVITAMIN WITH MINERALS) TABS tablet Take 1 tablet by mouth daily with breakfast.    [provider]  ondansetron  (ZOFRAN ) 4 MG tablet Take 1 tablet (4 mg total) by mouth every 6 (six) hours as needed for nausea. 11/23/23   Frankie Israel, MD  Oxcarbazepine  (TRILEPTAL ) 300 MG tablet Take 300 mg by mouth daily. 10/29/23   [provider]  prochlorperazine  (COMPAZINE ) 10 MG tablet Take 1 tablet (10 mg total) by mouth every 6 (six) hours as needed for nausea or vomiting. 11/23/23   Frankie Israel, MD  Vitamin D , Ergocalciferol , (DRISDOL ) 1.25 MG (50000 UNIT) CAPS capsule Take 50,000 Units by mouth every Monday.    [provider]      Allergies    Gabapentin , Modafinil, and Other    Review of Systems   Review of Systems  Respiratory:  Positive for shortness of breath.     Physical Exam Updated Vital Signs BP (!) 101/54   Pulse (!) 118   Temp (!) 101.4 F (38.6 C) (Oral)   Resp (!) 35   Ht 5\' 9"  (1.753 m)   Wt 116 kg   SpO2 93%   BMI 37.77 kg/m  Physical Exam Vitals and nursing note reviewed.  Constitutional:      General: He is not in acute distress.    Appearance: He is not ill-appearing or toxic-appearing.  HENT:     Head: Normocephalic and atraumatic.     Mouth/Throat:  Mouth: Mucous membranes are moist.     Pharynx: No oropharyngeal exudate or posterior oropharyngeal erythema.  Eyes:     General: No scleral icterus.       Right eye: No discharge.        Left eye: No discharge.     Conjunctiva/sclera: Conjunctivae normal.  Cardiovascular:     Rate and Rhythm: Regular rhythm. Tachycardia present.     Pulses: Normal pulses.     Heart sounds: Normal heart sounds. No murmur heard. Pulmonary:     Effort: Pulmonary effort is normal. Tachypnea present. No respiratory distress.     Breath sounds: No wheezing, rhonchi or rales.     Comments: Decreased breath sounds in lower lung fields Abdominal:     Palpations: Abdomen is soft.      Tenderness: There is no abdominal tenderness.  Musculoskeletal:     Right lower leg: No edema.     Left lower leg: No edema.     Comments: No calf tenderness to palpation  Skin:    General: Skin is warm and dry.     Findings: No rash.  Neurological:     General: No focal deficit present.     Mental Status: He is alert and oriented to person, place, and time. Mental status is at baseline.  Psychiatric:        Mood and Affect: Mood normal.     ED Results / Procedures / Treatments   Labs (all labs ordered are listed, but only abnormal results are displayed) Labs Reviewed  COMPREHENSIVE METABOLIC PANEL WITH GFR - Abnormal; Notable for the following components:      Result Value   Sodium 133 (*)    Potassium 2.8 (*)    Glucose, Bld 166 (*)    Creatinine, Ser 1.42 (*)    Calcium 8.2 (*)    All other components within normal limits  I-STAT CG4 LACTIC ACID, ED - Abnormal; Notable for the following components:   Lactic Acid, Venous 3.0 (*)    All other components within normal limits  I-STAT CG4 LACTIC ACID, ED - Abnormal; Notable for the following components:   Lactic Acid, Venous 3.4 (*)    All other components within normal limits  CULTURE, BLOOD (ROUTINE X 2)  CULTURE, BLOOD (ROUTINE X 2)  CBC WITH DIFFERENTIAL/PLATELET  URINALYSIS, W/ REFLEX TO CULTURE (INFECTION SUSPECTED)    EKG None  Radiology CT Angio Chest PE W/Cm &/Or Wo Cm Result Date: 01/03/2024 CLINICAL DATA:  Shortness of breath.  PE suspected. EXAM: CT ANGIOGRAPHY CHEST WITH CONTRAST TECHNIQUE: Multidetector CT imaging of the chest was performed using the standard protocol during bolus administration of intravenous contrast. Multiplanar CT image reconstructions and MIPs were obtained to evaluate the vascular anatomy. RADIATION DOSE REDUCTION: This exam was performed according to the departmental dose-optimization program which includes automated exposure control, adjustment of the mA and/or kV according to patient  size and/or use of iterative reconstruction technique. CONTRAST:  75mL OMNIPAQUE  IOHEXOL  350 MG/ML SOLN COMPARISON:  Same day chest radiograph; PET/CT 12/02/2023 and CT chest 07/01/2022 FINDINGS: Cardiovascular: Motion compromises evaluation of the segmental and subsegmental pulmonary arteries. No central pulmonary embolism. Normal caliber thoracic aorta. Small pericardial effusion. Mediastinum/Nodes: Layering debris in the trachea. Esophagus is unremarkable. Slightly increased mediastinal and hilar adenopathy compared to 12/02/2023. For example a 18 mm prevascular node (series 5/image 47) previously measured 16 mm; a left paratracheal node (5/46) measures 14 mm, previously 7 mm; a 12 mm right paratracheal node (5/39)  previously measured 10 mm. Bilateral hilar adenopathy measuring up to 12 mm on the right (5/55) and 13 mm on the left (5/59). Soft tissue thickening anterior to the SVC (5/62) has increased measuring 26 mm, previously 24 mm. Lungs/Pleura: New patchy bilateral peribronchovascular consolidation. Diffuse bronchial wall thickening. Bilateral centrilobular micro nodules. Trace right pleural effusion. No pneumothorax. Upper Abdomen: No acute abnormality. Musculoskeletal: No acute fracture or destructive osseous lesion. Review of the MIP images confirms the above findings. IMPRESSION: 1. Motion compromises evaluation of the segmental and subsegmental pulmonary arteries. No central pulmonary embolism. 2. Pulmonary findings most compatible with multifocal bronchopneumonia and/or aspiration. 3. Slightly increased mediastinal and hilar adenopathy compared to 12/02/2023. 4. Trace right pleural effusion. 5. Small pericardial effusion. Electronically Signed   By: Rozell Cornet M.D.   On: 01/03/2024 23:11   DG Chest Port 1 View Result Date: 01/03/2024 EXAM: 1 VIEW(S) XRAY OF THE CHEST 01/03/2024 07:15:30 PM COMPARISON: 05/14/2023 CLINICAL HISTORY: 141880 SOB (shortness of breath) 141880. Pt c/o SOB for 3x days.  Given a duo-neb by ems. Did have 1 episode of emesis the AM. H/o asthma. FINDINGS: LUNGS AND PLEURA: Mild patchy perihilar and lower lobe opacities, favoring mild infection/pneumonia over interstitial edema. No pleural effusion. No pneumothorax. HEART AND MEDIASTINUM: No acute abnormality of the cardiac and mediastinal silhouettes. BONES AND SOFT TISSUES: No acute osseous abnormality. LINES AND TUBES: Right chest port terminates at the cavoatrial junction in appropriate position. IMPRESSION: 1. Mild patchy perihilar and lower lobe opacities, favoring mild infection/pneumonia over interstitial edema. Electronically signed by: Zadie Herter MD 01/03/2024 07:49 PM EDT RP Workstation: [id number]    Procedures .Critical Care  Performed by: Derry Bureau, PA-C Authorized by: Adair Bureau, PA-C   Critical care provider statement:    Critical care time (minutes):  30   Critical care was necessary to treat or prevent imminent or life-threatening deterioration of the following conditions:  Respiratory failure   Critical care was time spent personally by me on the following activities:  Development of treatment plan with patient or surrogate, discussions with consultants, evaluation of patient's response to treatment, examination of patient, ordering and review of laboratory studies, ordering and review of radiographic studies, ordering and performing treatments and interventions, pulse oximetry, re-evaluation of patient's condition and review of old charts   Care discussed with: admitting provider   Comments:     Asthma, multifocal PNA, hypoxia requiring new O2 requirement     Medications Ordered in ED Medications  lidocaine  (XYLOCAINE ) 2 % jelly 1 Application (1 Application Topical Given 01/03/24 1730)  cefTRIAXone  (ROCEPHIN ) 1 g in sodium chloride  0.9 % 100 mL IVPB (0 g Intravenous Stopped 01/03/24 2007)  azithromycin  (ZITHROMAX ) 500 mg in sodium chloride  0.9 % 250 mL IVPB (0 mg  Intravenous Stopped 01/03/24 2035)  lactated ringers  bolus 1,000 mL (0 mLs Intravenous Stopped 01/03/24 2244)  ipratropium-albuterol  (DUONEB) 0.5-2.5 (3) MG/3ML nebulizer solution 3 mL (3 mLs Nebulization Given 01/03/24 1937)  ipratropium-albuterol  (DUONEB) 0.5-2.5 (3) MG/3ML nebulizer solution 3 mL (3 mLs Nebulization Given 01/03/24 1937)  methylPREDNISolone  sodium succinate (SOLU-MEDROL ) 125 mg/2 mL injection 125 mg (125 mg Intravenous Given 01/03/24 1933)  acetaminophen  (TYLENOL ) tablet 1,000 mg (1,000 mg Oral Given 01/03/24 2241)  iohexol  (OMNIPAQUE ) 350 MG/ML injection 75 mL (75 mLs Intravenous Contrast Given 01/03/24 2246)    ED Course/ Medical Decision Making/ A&P  Medical Decision Making Amount and/or Complexity of Data Reviewed Labs: ordered. Radiology: ordered.  Risk OTC drugs. Prescription drug management.   This patient presents to the ED for concern of shortness of breath, this involves an extensive number of treatment options, and is a complaint that carries with it a high risk of complications and morbidity.  The differential diagnosis includes Anxiety, Anaphylaxis/Angioedema, Aspirated FB, Arrhythmia, CHF, Asthma, COPD, PNA, COVID/Flu/RSV, STEMI, Tamponade, TPNX, Sepsis   Co morbidities that complicate the patient evaluation  developmental delay d/t fetal alcohol syndrome, ADHD, asthma, hodgkin lymphoma on immunotherapy infusions   Additional history obtained:  Patient referred from PCP office today for PNA and hypoxia   Problem List / ED Course / Critical interventions / Medication management  Patient presents to ED concerned for dry cough and SOB progressing for 3 days.  Patient also mentioning that he had a fever at his PCP appointment earlier today.  PCP sent patient to ED concern for pneumonia and hypoxia around 88% on RA.  Patient has multiple sick contacts at home. Patient tachycardic in ED which is slowly downtrending and  currently 118 bpm.  Patient did spike a temperature at 101.13F in ED.  Patient also tachypneic but does not appear to be respiratory distress and states that his breathing is feeling better. Patient's O2 currently maintained in the 90's on 4L [place]. I Ordered, and personally interpreted labs.  CMP with hyponatremia 133 and hypokalemia 2.8.  Creatinine is elevated at 1.42.  CBC without leukocytosis or anemia.  Lactic acid elevated at 3.0 and uptrending to 3.4 which is complicated by the Duonebs that patient received in ED. Blood cultures pending. The patient was maintained on a cardiac monitor.  I personally viewed and interpreted the cardiac monitored which showed an underlying rhythm of: sinus tachycardia I ordered imaging studies including chest xray/CTA to assess for process contributing to patient's symptoms. I independently visualized and interpreted imaging which showed multifocal PNA. I agree with the radiologist interpretation Started patient on IV ABX. Also provided patient dose of Sulo-medrol  for his Asthma and hypoxia. Patient also received 2 rounds of Duonebs in ED. Patient also given Tylenol  after he spiked a fever in the ED. I spoke with patient's legal guardian and shared that it is in patient's best interest to be admitted given new O2 requirement - she agreed with plan. 12:15AM Dr. Michell Ahumada admitting provider. I have reviewed the patients home medicines and have made adjustments as needed   Social Determinants of Health:  none          Final Clinical Impression(s) / ED Diagnoses Final diagnoses:  Multifocal pneumonia    Rx / DC Orders ED Discharge Orders     None         Gibson Bureau, [place] 01/04/24 0019    Rolinda Climes, DO 01/06/24 2319

## 2024-01-03 NOTE — ED Provider Notes (Incomplete)
 [place] EMERGENCY DEPARTMENT AT [facility] Provider Note   CSN: [id number] Arrival date & time: 01/03/24  1750     History {Add pertinent medical, surgical, social history, OB history to HPI:1} Chief Complaint  Patient presents with  . Shortness of Breath    Paul [name] is a 33 y.o. male with PMHx developmental delay d/t fetal alcohol syndrome, ADHD, asthma, hodgkin lymphoma on immunotherapy infusions who presents to ED concerned for dry cough and SOB progressing over the past 3 days. Patient did also have an episode of vomiting this morning. Patient also endorses a fever at his PCP office earlier today. PCP sent patient to ED given 88% O2 sat on RA.  Legal Guardian stating that the whole household has been sick with URI this past week.  Last immunotherapy infusion 12/22/2023. Patient stating that he is in remission but needs to continue infusions for the rest of the year.    Shortness of Breath      Home Medications Prior to Admission medications   Medication Sig Start Date End Date Taking? Authorizing Provider  albuterol  (PROVENTIL  HFA;VENTOLIN  HFA) 108 (90 BASE) MCG/ACT inhaler Inhale 2 puffs into the lungs every 6 (six) hours as needed for wheezing or shortness of breath.    [provider]  albuterol  (PROVENTIL ) (5 MG/ML) 0.5% nebulizer solution Take 2.5 mg by nebulization every 6 (six) hours as needed for wheezing or shortness of breath.    [provider]  amoxicillin -clavulanate (AUGMENTIN ) 875-125 MG tablet Take 1 tablet by mouth 2 (two) times daily. 12/22/23   Thayil, Irene T, PA-C  guaiFENesin  (ROBITUSSIN) 100 MG/5ML liquid Take 5 mLs by mouth every 4 (four) hours as needed for cough or to loosen phlegm. 05/16/23   Barbee Lew, MD  ibuprofen  (ADVIL ) 800 MG tablet Take 800 mg by mouth every 8 (eight) hours as needed for mild pain or headache.    [provider]  modafinil (PROVIGIL) 200 MG tablet Take 2 tablets by mouth  every morning. 10/29/23   [provider]  Multiple Vitamin (MULTIVITAMIN WITH MINERALS) TABS tablet Take 1 tablet by mouth daily with breakfast.    [provider]  ondansetron  (ZOFRAN ) 4 MG tablet Take 1 tablet (4 mg total) by mouth every 6 (six) hours as needed for nausea. 11/23/23   Frankie Israel, MD  Oxcarbazepine (TRILEPTAL) 300 MG tablet Take 300 mg by mouth daily. 10/29/23   [provider]  prochlorperazine  (COMPAZINE ) 10 MG tablet Take 1 tablet (10 mg total) by mouth every 6 (six) hours as needed for nausea or vomiting. 11/23/23   Kale, Gautam Kishore, MD  Vitamin D, Ergocalciferol, (DRISDOL) 1.25 MG (50000 UNIT) CAPS capsule Take 50,000 Units by mouth every Monday.    [provider]      Allergies    Gabapentin , Modafinil, and Other    Review of Systems   Review of Systems  Respiratory:  Positive for shortness of breath.     Physical Exam Updated Vital Signs BP (!) 101/54   Pulse (!) 118   Temp (!) 101.4 F (38.6 C) (Oral)   Resp (!) 35   Ht 5\' 9"  (1.753 m)   Wt 116 kg   SpO2 93%   BMI 37.77 kg/m  Physical Exam Vitals and nursing note reviewed.  Constitutional:      General: He is not in acute distress.    Appearance: He is not ill-appearing or toxic-appearing.  HENT:     Head:  Normocephalic and atraumatic.     Mouth/Throat:     Mouth: Mucous membranes are moist.     Pharynx: No oropharyngeal exudate or posterior oropharyngeal erythema.  Eyes:     General: No scleral icterus.       Right eye: No discharge.        Left eye: No discharge.     Conjunctiva/sclera: Conjunctivae normal.  Cardiovascular:     Rate and Rhythm: Regular rhythm. Tachycardia present.     Pulses: Normal pulses.     Heart sounds: Normal heart sounds. No murmur heard. Pulmonary:     Effort: Pulmonary effort is normal. Tachypnea present. No respiratory distress.     Breath sounds: No wheezing, rhonchi or rales.     Comments: Decreased breath sounds  in lower lung fields Abdominal:     Palpations: Abdomen is soft.     Tenderness: There is no abdominal tenderness.  Musculoskeletal:     Right lower leg: No edema.     Left lower leg: No edema.     Comments: No calf tenderness to palpation  Skin:    General: Skin is warm and dry.     Findings: No rash.  Neurological:     General: No focal deficit present.     Mental Status: He is alert and oriented to person, place, and time. Mental status is at baseline.  Psychiatric:        Mood and Affect: Mood normal.     ED Results / Procedures / Treatments   Labs (all labs ordered are listed, but only abnormal results are displayed) Labs Reviewed  COMPREHENSIVE METABOLIC PANEL WITH GFR - Abnormal; Notable for the following components:      Result Value   Sodium 133 (*)    Potassium 2.8 (*)    Glucose, Bld 166 (*)    Creatinine, Ser 1.42 (*)    Calcium 8.2 (*)    All other components within normal limits  I-STAT CG4 LACTIC ACID, ED - Abnormal; Notable for the following components:   Lactic Acid, Venous 3.0 (*)    All other components within normal limits  I-STAT CG4 LACTIC ACID, ED - Abnormal; Notable for the following components:   Lactic Acid, Venous 3.4 (*)    All other components within normal limits  CULTURE, BLOOD (ROUTINE X 2)  CULTURE, BLOOD (ROUTINE X 2)  CBC WITH DIFFERENTIAL/PLATELET  URINALYSIS, W/ REFLEX TO CULTURE (INFECTION SUSPECTED)    EKG None  Radiology CT Angio Chest PE W/Cm &/Or Wo Cm Result Date: 01/03/2024 CLINICAL DATA:  Shortness of breath.  PE suspected. EXAM: CT ANGIOGRAPHY CHEST WITH CONTRAST TECHNIQUE: Multidetector CT imaging of the chest was performed using the standard protocol during bolus administration of intravenous contrast. Multiplanar CT image reconstructions and MIPs were obtained to evaluate the vascular anatomy. RADIATION DOSE REDUCTION: This exam was performed according to the departmental dose-optimization program which includes  automated exposure control, adjustment of the mA and/or kV according to patient size and/or use of iterative reconstruction technique. CONTRAST:  75mL OMNIPAQUE  IOHEXOL  350 MG/ML SOLN COMPARISON:  Same day chest radiograph; PET/CT 12/02/2023 and CT chest 07/01/2022 FINDINGS: Cardiovascular: Motion compromises evaluation of the segmental and subsegmental pulmonary arteries. No central pulmonary embolism. Normal caliber thoracic aorta. Small pericardial effusion. Mediastinum/Nodes: Layering debris in the trachea. Esophagus is unremarkable. Slightly increased mediastinal and hilar adenopathy compared to 12/02/2023. For example a 18 mm prevascular node (series 5/image 47) previously measured 16 mm; a left paratracheal node (5/46) measures  14 mm, previously 7 mm; a 12 mm right paratracheal node (5/39) previously measured 10 mm. Bilateral hilar adenopathy measuring up to 12 mm on the right (5/55) and 13 mm on the left (5/59). Soft tissue thickening anterior to the SVC (5/62) has increased measuring 26 mm, previously 24 mm. Lungs/Pleura: New patchy bilateral peribronchovascular consolidation. Diffuse bronchial wall thickening. Bilateral centrilobular micro nodules. Trace right pleural effusion. No pneumothorax. Upper Abdomen: No acute abnormality. Musculoskeletal: No acute fracture or destructive osseous lesion. Review of the MIP images confirms the above findings. IMPRESSION: 1. Motion compromises evaluation of the segmental and subsegmental pulmonary arteries. No central pulmonary embolism. 2. Pulmonary findings most compatible with multifocal bronchopneumonia and/or aspiration. 3. Slightly increased mediastinal and hilar adenopathy compared to 12/02/2023. 4. Trace right pleural effusion. 5. Small pericardial effusion. Electronically Signed   By: Rozell Cornet M.D.   On: 01/03/2024 23:11   DG Chest Port 1 View Result Date: 01/03/2024 EXAM: 1 VIEW(S) XRAY OF THE CHEST 01/03/2024 07:15:30 PM COMPARISON: 05/14/2023  CLINICAL HISTORY: 141880 SOB (shortness of breath) 141880. Pt c/o SOB for 3x days. Given a duo-neb by ems. Did have 1 episode of emesis the AM. H/o asthma. FINDINGS: LUNGS AND PLEURA: Mild patchy perihilar and lower lobe opacities, favoring mild infection/pneumonia over interstitial edema. No pleural effusion. No pneumothorax. HEART AND MEDIASTINUM: No acute abnormality of the cardiac and mediastinal silhouettes. BONES AND SOFT TISSUES: No acute osseous abnormality. LINES AND TUBES: Right chest port terminates at the cavoatrial junction in appropriate position. IMPRESSION: 1. Mild patchy perihilar and lower lobe opacities, favoring mild infection/pneumonia over interstitial edema. Electronically signed by: Zadie Herter MD 01/03/2024 07:49 PM EDT RP Workstation: [id number]    Procedures .Critical Care  Performed by: [place] Bureau, PA-C Authorized by: [place] Bureau, PA-C   Critical care provider statement:    Critical care time (minutes):  30   Critical care was necessary to treat or prevent imminent or life-threatening deterioration of the following conditions:  Respiratory failure   Critical care was time spent personally by me on the following activities:  Development of treatment plan with patient or surrogate, discussions with consultants, evaluation of patient's response to treatment, examination of patient, ordering and review of laboratory studies, ordering and review of radiographic studies, ordering and performing treatments and interventions, pulse oximetry, re-evaluation of patient's condition and review of old charts   Care discussed with: admitting provider   Comments:     Asthma, multifocal PNA, hypoxia requiring new O2 requirement   {Document cardiac monitor, telemetry assessment procedure when appropriate:1}  Medications Ordered in ED Medications  lidocaine  (XYLOCAINE ) 2 % jelly 1 Application (1 Application Topical Given 01/03/24 1730)  cefTRIAXone  (ROCEPHIN ) 1 g  in sodium chloride  0.9 % 100 mL IVPB (0 g Intravenous Stopped 01/03/24 2007)  azithromycin  (ZITHROMAX ) 500 mg in sodium chloride  0.9 % 250 mL IVPB (0 mg Intravenous Stopped 01/03/24 2035)  lactated ringers  bolus 1,000 mL (0 mLs Intravenous Stopped 01/03/24 2244)  ipratropium-albuterol  (DUONEB) 0.5-2.5 (3) MG/3ML nebulizer solution 3 mL (3 mLs Nebulization Given 01/03/24 1937)  ipratropium-albuterol  (DUONEB) 0.5-2.5 (3) MG/3ML nebulizer solution 3 mL (3 mLs Nebulization Given 01/03/24 1937)  methylPREDNISolone  sodium succinate (SOLU-MEDROL ) 125 mg/2 mL injection 125 mg (125 mg Intravenous Given 01/03/24 1933)  acetaminophen  (TYLENOL ) tablet 1,000 mg (1,000 mg Oral Given 01/03/24 2241)  iohexol  (OMNIPAQUE ) 350 MG/ML injection 75 mL (75 mLs Intravenous Contrast Given 01/03/24 2246)    ED Course/ Medical Decision Making/ A&P   {  Click here for ABCD2, HEART and other calculatorsREFRESH Note before signing :1}                              Medical Decision Making Amount and/or Complexity of Data Reviewed Labs: ordered. Radiology: ordered.  Risk OTC drugs. Prescription drug management.   This patient presents to the ED for concern of shortness of breath, this involves an extensive number of treatment options, and is a complaint that carries with it a high risk of complications and morbidity.  The differential diagnosis includes Anxiety, Anaphylaxis/Angioedema, Aspirated FB, Arrhythmia, CHF, Asthma, COPD, PNA, COVID/Flu/RSV, STEMI, Tamponade, TPNX, Sepsis   Co morbidities that complicate the patient evaluation  developmental delay d/t fetal alcohol syndrome, ADHD, asthma, hodgkin lymphoma on immunotherapy infusions   Additional history obtained:  Patient referred from PCP office today for PNA and hypoxia   Problem List / ED Course / Critical interventions / Medication management  Patient presents to ED concerned for dry cough and SOB progressing for 3 days.  Patient also mentioning that he  had a fever at his PCP appointment earlier today.  PCP sent patient to ED concern for pneumonia and hypoxia around 88% on RA.  Patient has multiple sick contacts at home. Patient tachycardic in ED which is slowly downtrending and currently 118 bpm.  Patient did spike a temperature at 101.67F in ED.  Patient also tachypneic but does not appear to be respiratory distress and states that his breathing is feeling better. Patient's O2 currently maintained in the 90's on 4L [place]. I Ordered, and personally interpreted labs.  CMP with hyponatremia 133 and hypokalemia 2.8.  Creatinine is elevated at 1.42.  CBC without leukocytosis or anemia.  Lactic acid elevated at 3.0 and uptrending to 3.4 which is complicated by the Duonebs that patient received in ED. Blood cultures pending. The patient was maintained on a cardiac monitor.  I personally viewed and interpreted the cardiac monitored which showed an underlying rhythm of: sinus tachycardia I ordered imaging studies including chest xray/CTA to assess for process contributing to patient's symptoms. I independently visualized and interpreted imaging which showed multifocal PNA. I agree with the radiologist interpretation Started patient on IV ABX. Also provided patient dose of Sulo-medrol  for his Asthma and hypoxia. Patient also received 2 rounds of Duonebs in ED. Patient also given Tylenol  after he spiked a fever in the ED. I spoke with patient's legal guardian and shared that it is in patient's best interest to be admitted given new O2 requirement - she agreed with plan. Dr. Aaron Aas admitting provider. I have reviewed the patients home medicines and have made adjustments as needed   Social Determinants of Health:  none    {Document critical care time when appropriate:1} {Document review of labs and clinical decision tools ie heart score, Chads2Vasc2 etc:1}  {Document your independent review of radiology images, and any outside records:1} {Document your discussion  with family members, caretakers, and with consultants:1} {Document social determinants of health affecting pt's care:1} {Document your decision making why or why not admission, treatments were needed:1} Final Clinical Impression(s) / ED Diagnoses Final diagnoses:  Multifocal pneumonia    Rx / DC Orders ED Discharge Orders     None

## 2024-01-03 NOTE — ED Notes (Signed)
 Heidi Llamas, PA-C notified of pts temp of 101.4. see new orders for tylenol .

## 2024-01-04 ENCOUNTER — Inpatient Hospital Stay (HOSPITAL_COMMUNITY)

## 2024-01-04 DIAGNOSIS — R6520 Severe sepsis without septic shock: Secondary | ICD-10-CM | POA: Diagnosis present

## 2024-01-04 DIAGNOSIS — I3139 Other pericardial effusion (noninflammatory): Secondary | ICD-10-CM | POA: Diagnosis present

## 2024-01-04 DIAGNOSIS — N179 Acute kidney failure, unspecified: Secondary | ICD-10-CM | POA: Diagnosis present

## 2024-01-04 DIAGNOSIS — Z888 Allergy status to other drugs, medicaments and biological substances status: Secondary | ICD-10-CM | POA: Diagnosis not present

## 2024-01-04 DIAGNOSIS — C817A Other Hodgkin lymphoma, in remission: Secondary | ICD-10-CM | POA: Diagnosis present

## 2024-01-04 DIAGNOSIS — E876 Hypokalemia: Secondary | ICD-10-CM | POA: Diagnosis present

## 2024-01-04 DIAGNOSIS — E861 Hypovolemia: Secondary | ICD-10-CM | POA: Diagnosis present

## 2024-01-04 DIAGNOSIS — E860 Dehydration: Secondary | ICD-10-CM | POA: Diagnosis present

## 2024-01-04 DIAGNOSIS — A419 Sepsis, unspecified organism: Secondary | ICD-10-CM | POA: Diagnosis present

## 2024-01-04 DIAGNOSIS — R6250 Unspecified lack of expected normal physiological development in childhood: Secondary | ICD-10-CM | POA: Diagnosis present

## 2024-01-04 DIAGNOSIS — E872 Acidosis, unspecified: Secondary | ICD-10-CM | POA: Diagnosis present

## 2024-01-04 DIAGNOSIS — Z1152 Encounter for screening for COVID-19: Secondary | ICD-10-CM | POA: Diagnosis not present

## 2024-01-04 DIAGNOSIS — E871 Hypo-osmolality and hyponatremia: Secondary | ICD-10-CM | POA: Diagnosis present

## 2024-01-04 DIAGNOSIS — Q860 Fetal alcohol syndrome (dysmorphic): Secondary | ICD-10-CM | POA: Diagnosis not present

## 2024-01-04 DIAGNOSIS — Z79899 Other long term (current) drug therapy: Secondary | ICD-10-CM | POA: Diagnosis not present

## 2024-01-04 DIAGNOSIS — G4733 Obstructive sleep apnea (adult) (pediatric): Secondary | ICD-10-CM | POA: Diagnosis present

## 2024-01-04 DIAGNOSIS — J189 Pneumonia, unspecified organism: Principal | ICD-10-CM | POA: Diagnosis present

## 2024-01-04 DIAGNOSIS — J9601 Acute respiratory failure with hypoxia: Secondary | ICD-10-CM | POA: Diagnosis present

## 2024-01-04 DIAGNOSIS — Z8249 Family history of ischemic heart disease and other diseases of the circulatory system: Secondary | ICD-10-CM | POA: Diagnosis not present

## 2024-01-04 DIAGNOSIS — Z810 Family history of intellectual disabilities: Secondary | ICD-10-CM | POA: Diagnosis not present

## 2024-01-04 DIAGNOSIS — F909 Attention-deficit hyperactivity disorder, unspecified type: Secondary | ICD-10-CM | POA: Diagnosis present

## 2024-01-04 LAB — URINALYSIS, W/ REFLEX TO CULTURE (INFECTION SUSPECTED)
Bacteria, UA: NONE SEEN
Bilirubin Urine: NEGATIVE
Glucose, UA: NEGATIVE mg/dL
Hgb urine dipstick: NEGATIVE
Ketones, ur: NEGATIVE mg/dL
Leukocytes,Ua: NEGATIVE
Nitrite: NEGATIVE
Protein, ur: NEGATIVE mg/dL
Specific Gravity, Urine: 1.026 (ref 1.005–1.030)
pH: 5 (ref 5.0–8.0)

## 2024-01-04 LAB — CBC
HCT: 38.4 % — ABNORMAL LOW (ref 39.0–52.0)
Hemoglobin: 12.8 g/dL — ABNORMAL LOW (ref 13.0–17.0)
MCH: 30.8 pg (ref 26.0–34.0)
MCHC: 33.3 g/dL (ref 30.0–36.0)
MCV: 92.5 fL (ref 80.0–100.0)
Platelets: 144 10*3/uL — ABNORMAL LOW (ref 150–400)
RBC: 4.15 MIL/uL — ABNORMAL LOW (ref 4.22–5.81)
RDW: 12.1 % (ref 11.5–15.5)
WBC: 2.8 10*3/uL — ABNORMAL LOW (ref 4.0–10.5)
nRBC: 0 % (ref 0.0–0.2)

## 2024-01-04 LAB — RESP PANEL BY RT-PCR (RSV, FLU A&B, COVID)  RVPGX2
Influenza A by PCR: NEGATIVE
Influenza B by PCR: NEGATIVE
Resp Syncytial Virus by PCR: NEGATIVE
SARS Coronavirus 2 by RT PCR: NEGATIVE

## 2024-01-04 LAB — BASIC METABOLIC PANEL WITH GFR
Anion gap: 10 (ref 5–15)
BUN: 16 mg/dL (ref 6–20)
CO2: 23 mmol/L (ref 22–32)
Calcium: 8.5 mg/dL — ABNORMAL LOW (ref 8.9–10.3)
Chloride: 101 mmol/L (ref 98–111)
Creatinine, Ser: 0.79 mg/dL (ref 0.61–1.24)
GFR, Estimated: >60 mL/min (ref >60)
Glucose, Bld: 171 mg/dL — ABNORMAL HIGH (ref 70–99)
Potassium: 4.3 mmol/L (ref 3.5–5.1)
Sodium: 134 mmol/L — ABNORMAL LOW (ref 135–145)

## 2024-01-04 LAB — ECHOCARDIOGRAM COMPLETE
AR max vel: 4.33 cm2
AV Area VTI: 4.04 cm2
AV Area mean vel: 3.46 cm2
AV Mean grad: 3 mmHg
AV Peak grad: 5.3 mmHg
Ao pk vel: 1.15 m/s
Area-P 1/2: 4.15 cm2
Calc EF: 64.6 %
Height: 69 in
S' Lateral: 2.9 cm
Single Plane A2C EF: 65.3 %
Single Plane A4C EF: 61.4 %
Weight: 4091.74 [oz_av]

## 2024-01-04 LAB — STREP PNEUMONIAE URINARY ANTIGEN: Strep Pneumo Urinary Antigen: NEGATIVE

## 2024-01-04 LAB — MRSA NEXT GEN BY PCR, NASAL
MRSA by PCR Next Gen: NOT DETECTED
MRSA by PCR Next Gen: NOT DETECTED

## 2024-01-04 LAB — LACTIC ACID, PLASMA: Lactic Acid, Venous: 1.3 mmol/L (ref 0.5–1.9)

## 2024-01-04 LAB — MAGNESIUM: Magnesium: 1.9 mg/dL (ref 1.7–2.4)

## 2024-01-04 LAB — HIV ANTIBODY (ROUTINE TESTING W REFLEX): HIV Screen 4th Generation wRfx: NONREACTIVE

## 2024-01-04 MED ORDER — ORAL CARE MOUTH RINSE: 15.0000 mL | OROMUCOSAL | Status: DC | PRN

## 2024-01-04 MED ORDER — CHLORHEXIDINE GLUCONATE CLOTH 2 % EX PADS
6.0000 | MEDICATED_PAD | Freq: Every day | CUTANEOUS | Status: DC
  Administered 2024-01-04 – 2024-01-06 (×3): 6 via TOPICAL

## 2024-01-04 MED ORDER — IPRATROPIUM-ALBUTEROL 0.5-2.5 (3) MG/3ML IN SOLN
3.0000 mL | Freq: Four times a day (QID) | RESPIRATORY_TRACT | Status: DC | PRN
  Administered 2024-01-05: 3 mL via RESPIRATORY_TRACT
  Filled 2024-01-04: qty 3

## 2024-01-04 MED ORDER — SODIUM CHLORIDE 0.9 % IV SOLN
500.0000 mg | INTRAVENOUS | Status: DC
Start: 2024-01-04 — End: 2024-01-05
  Administered 2024-01-04: 500 mg via INTRAVENOUS
  Filled 2024-01-04: qty 5

## 2024-01-04 MED ORDER — METRONIDAZOLE 500 MG/100ML IV SOLN
500.0000 mg | Freq: Two times a day (BID) | INTRAVENOUS | Status: DC
  Administered 2024-01-04 – 2024-01-05 (×3): 500 mg via INTRAVENOUS
  Filled 2024-01-04 (×3): qty 100

## 2024-01-04 MED ORDER — POTASSIUM CHLORIDE 10 MEQ/100ML IV SOLN
10.0000 meq | INTRAVENOUS | Status: AC
  Administered 2024-01-04 (×3): 10 meq via INTRAVENOUS
  Filled 2024-01-04 (×3): qty 100

## 2024-01-04 MED ORDER — LACTATED RINGERS IV BOLUS
1000.0000 mL | Freq: Once | INTRAVENOUS | Status: DC
Start: 2024-01-04 — End: 2024-01-04

## 2024-01-04 MED ORDER — POTASSIUM CHLORIDE CRYS ER 20 MEQ PO TBCR
40.0000 meq | EXTENDED_RELEASE_TABLET | Freq: Once | ORAL | Status: AC
  Administered 2024-01-04: 40 meq via ORAL
  Filled 2024-01-04: qty 2

## 2024-01-04 MED ORDER — ACETAMINOPHEN 325 MG PO TABS
650.0000 mg | ORAL_TABLET | Freq: Four times a day (QID) | ORAL | Status: DC | PRN
  Administered 2024-01-05: 650 mg via ORAL
  Filled 2024-01-04: qty 2

## 2024-01-04 MED ORDER — ACETAMINOPHEN 650 MG RE SUPP: 650.0000 mg | Freq: Four times a day (QID) | RECTAL | Status: DC | PRN

## 2024-01-04 MED ORDER — SODIUM CHLORIDE 0.9 % IV SOLN
1.0000 g | INTRAVENOUS | Status: DC
  Administered 2024-01-04: 1 g via INTRAVENOUS
  Filled 2024-01-04: qty 10

## 2024-01-04 MED ORDER — DM-GUAIFENESIN ER 30-600 MG PO TB12
1.0000 | ORAL_TABLET | Freq: Two times a day (BID) | ORAL | Status: DC
  Administered 2024-01-04 – 2024-01-06 (×5): 1 via ORAL
  Filled 2024-01-04 (×5): qty 1

## 2024-01-04 MED ORDER — SODIUM CHLORIDE 0.9 % IV SOLN: INTRAVENOUS | Status: AC

## 2024-01-04 MED ORDER — ENOXAPARIN SODIUM 40 MG/0.4ML IJ SOSY
40.0000 mg | PREFILLED_SYRINGE | INTRAMUSCULAR | Status: DC
  Administered 2024-01-04 – 2024-01-06 (×3): 40 mg via SUBCUTANEOUS
  Filled 2024-01-04 (×3): qty 0.4

## 2024-01-04 MED ORDER — LACTATED RINGERS IV BOLUS
1000.0000 mL | Freq: Once | INTRAVENOUS | Status: AC
  Administered 2024-01-04: 1000 mL via INTRAVENOUS

## 2024-01-04 NOTE — H&P (Signed)
 History and Physical    TY GURULE GNF:[id number] DOB: 06/07/1991 DOA: 01/03/2024  PCP: Lazoff, Shawn P, DO  Patient coming from: Home  Chief Complaint: Shortness of breath  HPI: Paul [name] is a 33 y.o. 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  Oxygen saturation 88% on room air was given DuoNeb by EMS.  He had 1 episode of emesis in the morning.  Patient's legal guardian reported that his whole household has been sick with URI symptoms this past week.  In the ED, patient noted to be febrile with temperature 101.4 F, tachycardic, and tachypneic.  SBP in the 90s on arrival.  Required 4-5 L [place] to maintain sats in the 90s.  Labs showing no leukocytosis, sodium 133, potassium 2.8, magnesium 1.9, glucose 166, creatinine 1.4 (baseline 0.8), calcium 8.2, albumin 3.7, UA pending, blood cultures in process, lactic acid 3.0> 3.4.  CTA chest negative for central PE but showing findings compatible with multifocal bronchopneumonia and/or aspiration.  Showing slightly increased mediastinal and hilar adenopathy compared to 12/02/2023, trace right pleural effusion, and small pericardial effusion. Patient was given Tylenol , DuoNeb x 2, Solu-Medrol  125 mg, oral potassium 40 mEq, IV potassium 10 mg x 3, ceftriaxone , azithromycin , and 2 L LR in the ED.  [facility] called to admit.  Patient is reporting 3-day history of shortness of breath, cough, and congestion.  States his mother and brother are also sick with similar symptoms but recovering.  He reports fever yesterday and had 1 episode of vomiting in the morning but no further episodes since then.  Denies abdominal pain, diarrhea, or constipation.  He reports slight improvement of his symptoms  after receiving medications in the ED.  Denies chest pain.  No other complaints.  Review of Systems:  Review of Systems  All other systems reviewed and are negative.   Past Medical History:  Diagnosis Date   ADD (attention deficit disorder)    ADHD (attention deficit hyperactivity disorder)    Asthma    excercise induced and pollen   Auditory processing disorder    Complication of anesthesia    mother states pt. is harder to put under anes. and hard to wake up due to fetal alcohol syndrome; can be combative, per mother; states he will do better if mother is in PACU when he is coming out of anes.   Development delay    states mental status of a 33 year old   Exercise-induced asthma    prn inhaler/neb.   Fetal alcohol syndrome    Narcolepsy    Non-restorable tooth 09/2015   teeth   Separation anxiety    Twin birth     Past Surgical History:  Procedure Laterality Date   IR IMAGING GUIDED PORT INSERTION  08/11/2022   MULTIPLE EXTRACTIONS WITH ALVEOLOPLASTY N/A 09/23/2015   Procedure: MULTIPLE EXTRACTION WITH ALVEOLOPLASTY;  Surgeon: Ascencion Lava, DDS;  Location: [facility];  Service: Oral Surgery;  Laterality: N/A;   PYLOROMYOTOMY     age [age], performed found to not have stenosis on surgical exam   PYLOROMYOTOMY     did not have pyloric stenosis; did surgery on the wrong twin   RADIOLOGY WITH ANESTHESIA N/A 01/16/2020   Procedure: MRI WITH ANESTHESIA  BRAIN WITH AND WITHOUT CONTRAST;  Surgeon: Radiologist, Medication, MD;  Location: [facility] OR;  Service: Radiology;  Laterality: N/A;     reports that he has never smoked. He has never used smokeless tobacco. He reports that he does not drink alcohol and does not use drugs.  Allergies  Allergen Reactions   Gabapentin      MAKES HIM GO INTO A DEEP SLEEP   Modafinil Other (See Comments)    aggression   Other Other (See Comments)    Fetal alcohol syndrome  Certain (unnamed) medications have adverse effects on him.  Must have heavy anesthesia meds in order to work.    Family History  Adopted: Yes  Problem Relation Age of Onset   Mental illness Mother    Diabetes Mother    Hypertension Mother    Schizophrenia Mother    Bipolar disorder Mother    Mental retardation Father    Sleep apnea Neg Hx     Prior to Admission medications   Medication Sig Start Date End Date Taking? Authorizing Provider  albuterol  (PROVENTIL  HFA;VENTOLIN  HFA) 108 (90 BASE) MCG/ACT inhaler Inhale 2 puffs into the lungs every 6 (six) hours as needed for wheezing or shortness of breath.    [provider]  albuterol  (PROVENTIL ) (5 MG/ML) 0.5% nebulizer solution Take 2.5 mg by nebulization every 6 (six) hours as needed for wheezing or shortness of breath.    [provider]  amoxicillin -clavulanate (AUGMENTIN ) 875-125 MG tablet Take 1 tablet by mouth 2 (two) times daily. 12/22/23   Thayil, Irene T, PA-C  guaiFENesin  (ROBITUSSIN) 100 MG/5ML liquid Take 5 mLs by mouth every 4 (four) hours as needed for cough or to loosen phlegm. 05/16/23   Barbee Lew, MD  ibuprofen  (ADVIL ) 800 MG tablet Take 800 mg by mouth every 8 (eight) hours as needed for mild pain or headache.    [provider]  modafinil (PROVIGIL) 200 MG tablet Take 2 tablets by mouth every morning. 10/29/23   [provider]  Multiple Vitamin (MULTIVITAMIN WITH MINERALS) TABS tablet Take 1 tablet by mouth daily with breakfast.    [provider]  ondansetron  (ZOFRAN ) 4 MG tablet Take 1 tablet (4 mg total) by mouth every 6 (six) hours as needed for nausea. 11/23/23   Frankie Israel, MD  Oxcarbazepine (TRILEPTAL) 300 MG tablet Take 300 mg by mouth daily. 10/29/23   [provider]  prochlorperazine  (COMPAZINE ) 10 MG tablet Take 1 tablet (10 mg total) by mouth every 6 (six) hours as needed for nausea or vomiting. 11/23/23   Kale, Gautam Kishore, MD  Vitamin D, Ergocalciferol, (DRISDOL) 1.25 MG (50000 UNIT) CAPS capsule  Take 50,000 Units by mouth every Monday.    [provider]    Physical Exam: Vitals:   01/04/24 0014 01/04/24 0014 01/04/24 0130 01/04/24 0200  BP:  (!) 103/59 112/63 112/67  Pulse:  (!) 112 (!) 101 (!) 103  Resp:  (!) 35 (!) 33   Temp: 98.8 F (37.1 C) 98.8 F (37.1 C)    TempSrc: Oral Oral    SpO2:  92% 93% 93%  Weight:      Height:        Physical Exam Vitals reviewed.  Constitutional:      General: He is not in acute distress.    Appearance: He is ill-appearing.  HENT:     Head: Normocephalic and atraumatic.  Eyes:     Extraocular Movements: Extraocular movements intact.  Cardiovascular:     Rate and Rhythm: Normal rate and regular rhythm.  Pulses: Normal pulses.  Pulmonary:     Effort: Pulmonary effort is normal. No respiratory distress.     Breath sounds: No wheezing or rales.  Abdominal:     General: Bowel sounds are normal. There is no distension.     Palpations: Abdomen is soft.     Tenderness: There is no abdominal tenderness. There is no guarding.  Musculoskeletal:     Cervical back: Normal range of motion.     Right lower leg: No edema.     Left lower leg: No edema.  Skin:    General: Skin is warm and dry.  Neurological:     General: No focal deficit present.     Mental Status: He is alert and oriented to person, place, and time.     Labs on Admission: I have personally reviewed following labs and imaging studies  CBC: Recent Labs  Lab 01/03/24 1854  WBC 4.5  NEUTROABS 2.8  HGB 13.3  HCT 40.6  MCV 93.1  PLT 150   Basic Metabolic Panel: Recent Labs  Lab 01/03/24 1854 01/04/24 0116  NA 133*  --   K 2.8*  --   CL 98  --   CO2 24  --   GLUCOSE 166*  --   BUN 18  --   CREATININE 1.42*  --   CALCIUM 8.2*  --   MG  --  1.9   GFR: Estimated Creatinine Clearance: 94.7 mL/min (A) (by C-G formula based on SCr of 1.42 mg/dL (H)). Liver Function Tests: Recent Labs  Lab 01/03/24 1854  AST 33  ALT 30  ALKPHOS 88  BILITOT  0.6  PROT 6.9  ALBUMIN 3.7   No results for input(s): "LIPASE", "AMYLASE" in the last 168 hours. No results for input(s): "AMMONIA" in the last 168 hours. Coagulation Profile: No results for input(s): "INR", "PROTIME" in the last 168 hours. Cardiac Enzymes: No results for input(s): "CKTOTAL", "CKMB", "CKMBINDEX", "TROPONINI" in the last 168 hours. BNP (last 3 results) No results for input(s): "PROBNP" in the last 8760 hours. HbA1C: No results for input(s): "HGBA1C" in the last 72 hours. CBG: No results for input(s): "GLUCAP" in the last 168 hours. Lipid Profile: No results for input(s): "CHOL", "HDL", "LDLCALC", "TRIG", "CHOLHDL", "LDLDIRECT" in the last 72 hours. Thyroid  Function Tests: No results for input(s): "TSH", "T4TOTAL", "FREET4", "T3FREE", "THYROIDAB" in the last 72 hours. Anemia Panel: No results for input(s): "VITAMINB12", "FOLATE", "FERRITIN", "TIBC", "IRON", "RETICCTPCT" in the last 72 hours. Urine analysis:    Component Value Date/Time   COLORURINE YELLOW 05/13/2023 0604   APPEARANCEUR CLEAR 05/13/2023 0604   LABSPEC 1.014 05/13/2023 0604   PHURINE 6.0 05/13/2023 0604   GLUCOSEU NEGATIVE 05/13/2023 0604   HGBUR SMALL (A) 05/13/2023 0604   BILIRUBINUR NEGATIVE 05/13/2023 0604   KETONESUR 5 (A) 05/13/2023 0604   PROTEINUR 100 (A) 05/13/2023 0604   NITRITE NEGATIVE 05/13/2023 0604   LEUKOCYTESUR NEGATIVE 05/13/2023 0604    Radiological Exams on Admission: CT Angio Chest PE W/Cm &/Or Wo Cm Result Date: 01/03/2024 CLINICAL DATA:  Shortness of breath.  PE suspected. EXAM: CT ANGIOGRAPHY CHEST WITH CONTRAST TECHNIQUE: Multidetector CT imaging of the chest was performed using the standard protocol during bolus administration of intravenous contrast. Multiplanar CT image reconstructions and MIPs were obtained to evaluate the vascular anatomy. RADIATION DOSE REDUCTION: This exam was performed according to the departmental dose-optimization program which includes automated  exposure control, adjustment of the mA and/or kV according to patient size and/or use of  iterative reconstruction technique. CONTRAST:  75mL OMNIPAQUE  IOHEXOL  350 MG/ML SOLN COMPARISON:  Same day chest radiograph; PET/CT 12/02/2023 and CT chest 07/01/2022 FINDINGS: Cardiovascular: Motion compromises evaluation of the segmental and subsegmental pulmonary arteries. No central pulmonary embolism. Normal caliber thoracic aorta. Small pericardial effusion. Mediastinum/Nodes: Layering debris in the trachea. Esophagus is unremarkable. Slightly increased mediastinal and hilar adenopathy compared to 12/02/2023. For example a 18 mm prevascular node (series 5/image 47) previously measured 16 mm; a left paratracheal node (5/46) measures 14 mm, previously 7 mm; a 12 mm right paratracheal node (5/39) previously measured 10 mm. Bilateral hilar adenopathy measuring up to 12 mm on the right (5/55) and 13 mm on the left (5/59). Soft tissue thickening anterior to the SVC (5/62) has increased measuring 26 mm, previously 24 mm. Lungs/Pleura: New patchy bilateral peribronchovascular consolidation. Diffuse bronchial wall thickening. Bilateral centrilobular micro nodules. Trace right pleural effusion. No pneumothorax. Upper Abdomen: No acute abnormality. Musculoskeletal: No acute fracture or destructive osseous lesion. Review of the MIP images confirms the above findings. IMPRESSION: 1. Motion compromises evaluation of the segmental and subsegmental pulmonary arteries. No central pulmonary embolism. 2. Pulmonary findings most compatible with multifocal bronchopneumonia and/or aspiration. 3. Slightly increased mediastinal and hilar adenopathy compared to 12/02/2023. 4. Trace right pleural effusion. 5. Small pericardial effusion. Electronically Signed   By: Rozell Cornet M.D.   On: 01/03/2024 23:11   DG Chest Port 1 View Result Date: 01/03/2024 EXAM: 1 VIEW(S) XRAY OF THE CHEST 01/03/2024 07:15:30 PM COMPARISON: 05/14/2023 CLINICAL  HISTORY: 141880 SOB (shortness of breath) 141880. Pt c/o SOB for 3x days. Given a duo-neb by ems. Did have 1 episode of emesis the AM. H/o asthma. FINDINGS: LUNGS AND PLEURA: Mild patchy perihilar and lower lobe opacities, favoring mild infection/pneumonia over interstitial edema. No pleural effusion. No pneumothorax. HEART AND MEDIASTINUM: No acute abnormality of the cardiac and mediastinal silhouettes. BONES AND SOFT TISSUES: No acute osseous abnormality. LINES AND TUBES: Right chest port terminates at the cavoatrial junction in appropriate position. IMPRESSION: 1. Mild patchy perihilar and lower lobe opacities, favoring mild infection/pneumonia over interstitial edema. Electronically signed by: Zadie Herter MD 01/03/2024 07:49 PM EDT RP Workstation: [id number]    EKG: Independently reviewed.  Sinus tachycardia.  Assessment and Plan  Severe sepsis secondary to multifocal pneumonia Acute hypoxemic respiratory failure Meets criteria for severe sepsis with fever, tachycardia, tachypnea, lactic acidosis, and acute hypoxemia.  Oxygen saturation 88% on room air, currently requiring 5 L [place] to maintain sats in the 90s.  CTA chest negative for central PE but showing findings compatible with multifocal bronchopneumonia and/or aspiration.  No leukocytosis on labs.  SBP initially in the 90s, now improved to 110s after IV fluids and tachycardia has resolved.  ?Viral pneumonia as other family members are also sick with similar symptoms although per review of chart, antigen testing for COVID/flu A and B was negative yesterday. -Will continue antibiotics for now for possible bacterial infection.  Continue ceftriaxone  and azithromycin .  Add on metronidazole for anaerobic coverage. -IV fluid hydration -Mucinex  DM -DuoNeb as needed -Acetaminophen  as needed for fevers -MRSA PCR screen -Sputum Gram stain and culture -Strep pneumo and Legionella urinary antigens -COVID/influenza/RSV PCR.  If negative, then order  for respiratory viral panel. -Follow-up blood cultures -Trend lactate and WBC count -Continue supplemental oxygen, wean as tolerated  AKI Likely prerenal from dehydration.  Creatinine 1.4, baseline 0.8.  Continue IV fluid hydration and monitor renal function.  Avoid nephrotoxic agents.  Hypokalemia Magnesium within normal range.  Replace  potassium and continue to monitor labs.  Emesis Patient had 1 episode of emesis at home yesterday morning.  No longer having any nausea or vomiting at this time.  No abdominal pain or tenderness.  LFTs normal.  Mild hypovolemic hyponatremia Continue IV fluid hydration and monitor labs.  Mild hypocalcemia Continue to monitor labs and replace calcium as needed.  Small pericardial effusion Seen on CT and echo ordered for further evaluation.  Hodgkin's lymphoma on immunotherapy Outpatient oncology follow-up.  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  OSA Continue nightly CPAP.  ADHD/anxiety/depression/narcolepsy Pharmacy med rec pending.  DVT prophylaxis: Lovenox  Code Status: Full Code (discussed with the patient) Family Communication: No family available at this time. Level of care: Step Down Unit Admission status: It is my clinical opinion that admission to INPATIENT is reasonable and necessary because of the expectation that this patient will require hospital care that crosses at least 2 midnights to treat this condition based on the medical complexity of the problems presented.  Given the aforementioned information, the predictability of an adverse outcome is felt to be significant.  Juliette Oh MD [facility]  If 7PM-7AM, please contact night-coverage [email or web address]  01/04/2024, 2:44 AM

## 2024-01-04 NOTE — Progress Notes (Signed)
  Echocardiogram 2D Echocardiogram has been performed.  Annis Kinder, RDCS 01/04/2024, 9:42 AM

## 2024-01-04 NOTE — Progress Notes (Signed)
 This is a day 0 progress note done for his 33 year old male who has significant past medical history of congenital developmental delay history of cancer that currently undergoing chemotherapy and immunocompromised presents for acute hypoxemic respiratory failure secondary to multifocal pneumonia and severe sepsis. Patient was seen and examined this morning.  The patient is currently on 5 L of oxygen via nasal cannula denies any worsening of shortness of breath.  Continues to be hemodynamically stable.  Severe sepsis secondary to multifocal pneumonia Acute hypoxemic respiratory failure Meets criteria for severe sepsis with fever, tachycardia, tachypnea, lactic acidosis, and acute hypoxemia.  Oxygen saturation 88% on room air, currently requiring 5 L [place] to maintain sats in the 90s.  CTA chest negative for central PE but showing findings compatible with multifocal bronchopneumonia and/or aspiration.  No leukocytosis on labs.  -antigen testing for COVID/flu A and B was negative yesterday. -Will continue antibiotics for now for possible bacterial infection.  Continue ceftriaxone  and azithromycin .  Add on metronidazole for anaerobic coverage. -IV fluid hydration -Mucinex  DM -DuoNeb as needed -Acetaminophen  as needed for fevers -MRSA PCR screen -Sputum Gram stain and culture -Strep pneumo and Legionella urinary antigens -COVID/influenza/RSV PCR.  If negative, then order for respiratory viral panel. -Follow-up blood cultures -Trend lactate and WBC count -Continue supplemental oxygen, wean as tolerated   AKI Likely prerenal from dehydration.  Creatinine 1.4, baseline 0.8.  Continue IV fluid hydration and monitor renal function.  Avoid nephrotoxic agents. - Creatinine back to baseline.   Hypokalemia Magnesium within normal range.  Replace potassium and continue to monitor labs. -Current potassium 4.3   Emesis Patient had 1 episode of emesis at home yesterday morning.  No longer having any nausea  or vomiting at this time.  No abdominal pain or tenderness.  LFTs normal.   Mild hypovolemic hyponatremia Continue IV fluid hydration and monitor labs.   Mild hypocalcemia Continue to monitor labs and replace calcium as needed.   Small pericardial effusion Seen on CT and echo ordered for further evaluation.   Hodgkin's lymphoma on immunotherapy Outpatient oncology follow-up.   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   OSA Continue nightly CPAP.   ADHD/anxiety/depression/narcolepsy Pharmacy med rec pending.   DVT prophylaxis: Lovenox 

## 2024-01-04 NOTE — Progress Notes (Signed)
 Pt set up on CPAP auto titrate with 4 lpm bled in oxygen.  Pt states he will apply it when he is ready for rest.

## 2024-01-04 NOTE — Progress Notes (Signed)
   01/04/24 2315  BiPAP/CPAP/SIPAP  $ Non-Invasive Home Ventilator  Initial  $ Face Mask Large  Yes  BiPAP/CPAP/SIPAP Pt Type Adult  BiPAP/CPAP/SIPAP DREAMSTATIOND  Mask Type Full face mask  Dentures removed? Not applicable  Mask Size Large  Patient Home Machine No  Patient Home Mask No  Patient Home Tubing No  Auto Titrate Yes  BiPAP/CPAP /SiPAP Vitals  Pulse Rate 72  Resp 20  BP 128/72  SpO2 99 %  Bilateral Breath Sounds Diminished  MEWS Score/Color  MEWS Score 0  MEWS Score Color Green

## 2024-01-05 ENCOUNTER — Encounter (HOSPITAL_COMMUNITY): Payer: Self-pay | Admitting: Internal Medicine

## 2024-01-05 DIAGNOSIS — J189 Pneumonia, unspecified organism: Secondary | ICD-10-CM | POA: Diagnosis not present

## 2024-01-05 LAB — COMPREHENSIVE METABOLIC PANEL WITH GFR
ALT: 41 U/L (ref 0–44)
AST: 40 U/L (ref 15–41)
Albumin: 3.5 g/dL (ref 3.5–5.0)
Alkaline Phosphatase: 74 U/L (ref 38–126)
Anion gap: 10 (ref 5–15)
BUN: 17 mg/dL (ref 6–20)
CO2: 24 mmol/L (ref 22–32)
Calcium: 8.2 mg/dL — ABNORMAL LOW (ref 8.9–10.3)
Chloride: 104 mmol/L (ref 98–111)
Creatinine, Ser: 0.57 mg/dL — ABNORMAL LOW (ref 0.61–1.24)
GFR, Estimated: >60 mL/min (ref >60)
Glucose, Bld: 103 mg/dL — ABNORMAL HIGH (ref 70–99)
Potassium: 3.8 mmol/L (ref 3.5–5.1)
Sodium: 138 mmol/L (ref 135–145)
Total Bilirubin: 0.6 mg/dL (ref 0.0–1.2)
Total Protein: 6.7 g/dL (ref 6.5–8.1)

## 2024-01-05 LAB — CBC WITH DIFFERENTIAL/PLATELET
Abs Immature Granulocytes: 0.02 10*3/uL (ref 0.00–0.07)
Basophils Absolute: 0 10*3/uL (ref 0.0–0.1)
Basophils Relative: 0 %
Eosinophils Absolute: 0 10*3/uL (ref 0.0–0.5)
Eosinophils Relative: 1 %
HCT: 40.6 % (ref 39.0–52.0)
Hemoglobin: 12.9 g/dL — ABNORMAL LOW (ref 13.0–17.0)
Immature Granulocytes: 0 %
Lymphocytes Relative: 44 %
Lymphs Abs: 2.3 10*3/uL (ref 0.7–4.0)
MCH: 29.9 pg (ref 26.0–34.0)
MCHC: 31.8 g/dL (ref 30.0–36.0)
MCV: 94.2 fL (ref 80.0–100.0)
Monocytes Absolute: 0.4 10*3/uL (ref 0.1–1.0)
Monocytes Relative: 8 %
Neutro Abs: 2.4 10*3/uL (ref 1.7–7.7)
Neutrophils Relative %: 47 %
Platelets: 159 10*3/uL (ref 150–400)
RBC: 4.31 MIL/uL (ref 4.22–5.81)
RDW: 12.4 % (ref 11.5–15.5)
WBC: 5.3 10*3/uL (ref 4.0–10.5)
nRBC: 0 % (ref 0.0–0.2)

## 2024-01-05 MED ORDER — SALINE SPRAY 0.65 % NA SOLN
1.0000 | NASAL | Status: DC | PRN
  Filled 2024-01-05: qty 44

## 2024-01-05 MED ORDER — ADULT MULTIVITAMIN W/MINERALS CH
1.0000 | ORAL_TABLET | Freq: Every day | ORAL | Status: DC
  Administered 2024-01-06: 1 via ORAL
  Filled 2024-01-05: qty 1

## 2024-01-05 MED ORDER — SODIUM CHLORIDE 0.9 % IV SOLN
500.0000 mg | INTRAVENOUS | Status: AC
  Administered 2024-01-05: 500 mg via INTRAVENOUS
  Filled 2024-01-05: qty 5

## 2024-01-05 MED ORDER — SODIUM CHLORIDE 0.9 % IV SOLN
1.0000 g | INTRAVENOUS | Status: AC
  Administered 2024-01-05: 1 g via INTRAVENOUS
  Filled 2024-01-05: qty 10

## 2024-01-05 MED ORDER — OXCARBAZEPINE 300 MG PO TABS
600.0000 mg | ORAL_TABLET | Freq: Every day | ORAL | Status: DC
  Administered 2024-01-05 – 2024-01-06 (×2): 600 mg via ORAL
  Filled 2024-01-05 (×2): qty 2

## 2024-01-05 MED ORDER — VITAMIN D (ERGOCALCIFEROL) 1.25 MG (50000 UNIT) PO CAPS: 50000.0000 [IU] | ORAL_CAPSULE | ORAL | Status: DC

## 2024-01-05 MED ORDER — AMOXICILLIN-POT CLAVULANATE 875-125 MG PO TABS: 1.0000 | ORAL_TABLET | Freq: Two times a day (BID) | ORAL | Status: DC

## 2024-01-05 NOTE — Progress Notes (Signed)
 Pt will require TOC meds to bed at time of discharge.

## 2024-01-05 NOTE — Progress Notes (Signed)
 Progress Note   Patient: Paul [name] RJJ:[id number] DOB: May 14, 1991 DOA: 01/03/2024     1 DOS: the patient was seen and examined on 01/05/2024   Brief hospital course: Paul [name] is a 33 y.o. 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  Patient was started on a Rocephin  azithromycin  metronidazole initially needed 5 L of oxygen.  Currently patient symptoms improved weaned off oxygen.  Metronidazole de-escalated.  Viral respiratory panel negative.  Urine for Legionella and Streptococcus negative.  If patient continues to do fine possibly can be discharged home on Augmentin .  Assessment and Plan:  Severe sepsis secondary to multifocal pneumonia Acute hypoxemic respiratory failure Meets criteria for severe sepsis with fever, tachycardia, tachypnea, lactic acidosis, and acute hypoxemia.  Oxygen saturation 88% on room air, currently requiring 5 L [place] to maintain sats in the 90s.  CTA chest negative for central PE but showing findings compatible with multifocal bronchopneumonia and/or aspiration.  No leukocytosis on labs.  -antigen testing for COVID/flu A and B was negative yesterday. -Continue Rocephin , azithromycin , de-escalate metronidazole -IV fluid hydration can be stopped -Mucinex  DM -DuoNeb as needed -Acetaminophen  as needed for fevers -MRSA PCR screen -Sputum Gram stain and culture -Strep pneumo and Legionella urinary antigens negative -COVID/influenza/RSV PCR.  Complete viral respiratory panel negative -Follow-up blood cultures no growth till date - Patient has been completely weaned off oxygen currently on room air.   AKI Likely prerenal from dehydration.  Creatinine 1.4, baseline 0.8.  Continue IV fluid  hydration and monitor renal function.  Avoid nephrotoxic agents. - Creatinine back to baseline.   Hypokalemia Magnesium within normal range.  Replace potassium and continue to monitor labs. -Current potassium 4.3   Emesis Patient had 1 episode of emesis at home yesterday morning.  No longer having any nausea or vomiting at this time.  No abdominal pain or tenderness.  LFTs normal.   Mild hypovolemic hyponatremia Continue IV fluid hydration and monitor labs.   Mild hypocalcemia Continue to monitor labs and replace calcium as needed.   Small pericardial effusion Seen on CT and echo ordered for further evaluation.   Hodgkin's lymphoma on immunotherapy Outpatient oncology follow-up.   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   OSA Continue nightly CPAP.   ADHD/anxiety/depression/narcolepsy Pharmacy med rec pending.   DVT prophylaxis: Lovenox                 Subjective: Patient was seen and examined bedside today.  Patient has no new complaints reports cough has improved he had 1 episode of nausea and vomiting early in the morning.  Patient has been weaned off of oxygen.  Denies any worsening of shortness of breath  Physical Exam: Vitals:   01/05/24 0500 01/05/24 0600 01/05/24 0622 01/05/24 1030  BP:   117/79 106/63  Pulse: 80 95 72 91  Resp: (!) 25 (!) 35 18 16  Temp:   98.9 F (37.2 C) 99.7 F (37.6 C)  TempSrc:   Oral Oral  SpO2: 100% 96% 100% 93%  Weight:  113.8 kg    Height:       Constitutional:      General: He is not in acute distress.    Appearance: He is ill-appearing.  HENT:     Head: Normocephalic and atraumatic.  Eyes:     Extraocular Movements: Extraocular movements intact.  Cardiovascular:     Rate and Rhythm: Normal rate and regular rhythm.     Pulses: Normal pulses.  Pulmonary:     Effort: Pulmonary effort is normal. No respiratory distress.     Breath sounds: No wheezing or rales.  Abdominal:      General: Bowel sounds are normal. There is no distension.     Palpations: Abdomen is soft.     Tenderness: There is no abdominal tenderness. There is no guarding.  Musculoskeletal:     Cervical back: Normal range of motion.     Right lower leg: No edema.     Left lower leg: No edema.  Skin:    General: Skin is warm and dry.  Neurological:     General: No focal deficit present.     Mental Status: He is alert and oriented to person, place, and time.    Data Reviewed:  Results reviewed  Family Communication: Spoke to patient's mother over phone  Disposition: Status is: Inpatient Remains inpatient appropriate because: Multifocal pneumonia  Planned Discharge Destination: Home    Time spent: 35 minutes  Author: Edwena Graham, MD 01/05/2024 1:26 PM  For on call review [email or web address].

## 2024-01-05 NOTE — Progress Notes (Signed)
 SATURATION QUALIFICATIONS: (This note is used to comply with regulatory documentation for home oxygen)  Patient Saturations on Room Air at Rest = 95%  Patient Saturations on Room Air while Ambulating = 88%  Patient Saturations on 2 Liters of oxygen while Ambulating = 95%  Pt able to stop, take a few breaths and O2 levels return to 92-95%, if patient walks to quickly, O2 levels drop to 88% or below. Able to recover quickly without O2.

## 2024-01-05 NOTE — TOC Initial Note (Signed)
 Transition of Care [facility]) - Initial/Assessment Note    Patient Details  Name: Paul [name] MRN: [id number] Date of Birth: [birth year]  Transition [facility] Care [facility]) CM/SW Contact:    Ruben Corolla, RN Phone Number: 01/05/2024, 3:51 PM  Clinical Narrative: Developmental delay. Spoke to Teresa(mother) d/c plan home. Has family support.Has own transport home.                  Expected Discharge Plan: Home/Self Care Barriers to Discharge: Continued Medical Work up   Patient Goals and CMS Choice Patient states their goals for this hospitalization and ongoing recovery are:: Home CMS Medicare.gov Compare Post Acute Care list provided to:: Patient Represenative (must comment) Choice offered to / list presented to : Parent [place] ownership interest in [facility].provided to:: Parent NA    Expected Discharge Plan and Services     Post Acute Care Choice: Resumption of Svcs/PTA Provider Living arrangements for the past 2 months: Single Family Home                                      Prior Living Arrangements/Services Living arrangements for the past 2 months: Single Family Home Lives with:: Parents                   Activities of Daily Living   ADL Screening (condition at time of admission) Independently performs ADLs?: Yes (appropriate for developmental age) Is the patient deaf or have difficulty hearing?: No Does the patient have difficulty seeing, even when wearing glasses/contacts?: No Does the patient have difficulty concentrating, remembering, or making decisions?: No  Permission Sought/Granted                  Emotional Assessment              Admission diagnosis:  AKI (acute kidney injury) (HCC) [N17.9] Multifocal pneumonia [J18.9] Patient Active Problem List   Diagnosis Date Noted   Multifocal pneumonia 01/04/2024   Acute hypoxemic respiratory failure (HCC) 01/04/2024   AKI (acute kidney injury) (HCC) 01/04/2024   Hypokalemia  01/04/2024   Severe sepsis (HCC) 05/13/2023   Pneumonia 05/12/2023   Port-A-Cath in place 09/22/2022   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 [place] [address] Phone: [phone] Fax: [phone]     Social Drivers of Health (SDOH) Social History: SDOH Screenings   Food Insecurity: No Food Insecurity (01/05/2024)  Housing: Unknown (01/05/2024)  Transportation Needs: No Transportation Needs (01/05/2024)  Utilities: Not At Risk (01/05/2024)  Financial Resource Strain: Low Risk  (04/09/2021)   Received from [facility] visits prior to 11/07/2022., [facility] visits prior to 11/07/2022.  Physical Activity: Insufficiently Active (04/09/2021)   Received from [facility] visits prior to 11/07/2022., [facility] visits prior to 11/07/2022.  Social Connections: Socially Isolated (04/09/2021)   Received from [facility] visits prior to 11/07/2022., [facility] visits prior to  11/07/2022.  Stress: No Stress Concern Present (04/09/2021)   Received from [facility] visits prior to 11/07/2022., [facility] visits prior to 11/07/2022.  Tobacco Use: Low Risk  (01/05/2024)   SDOH Interventions:     Readmission Risk Interventions     No data to display

## 2024-01-06 ENCOUNTER — Other Ambulatory Visit (HOSPITAL_COMMUNITY): Payer: Self-pay

## 2024-01-06 DIAGNOSIS — J189 Pneumonia, unspecified organism: Secondary | ICD-10-CM | POA: Diagnosis not present

## 2024-01-06 LAB — CBC WITH DIFFERENTIAL/PLATELET
Abs Immature Granulocytes: 0.01 10*3/uL (ref 0.00–0.07)
Basophils Absolute: 0 10*3/uL (ref 0.0–0.1)
Basophils Relative: 1 %
Eosinophils Absolute: 0.1 10*3/uL (ref 0.0–0.5)
Eosinophils Relative: 3 %
HCT: 40.3 % (ref 39.0–52.0)
Hemoglobin: 13.4 g/dL (ref 13.0–17.0)
Immature Granulocytes: 0 %
Lymphocytes Relative: 62 %
Lymphs Abs: 2.3 10*3/uL (ref 0.7–4.0)
MCH: 30.2 pg (ref 26.0–34.0)
MCHC: 33.3 g/dL (ref 30.0–36.0)
MCV: 91 fL (ref 80.0–100.0)
Monocytes Absolute: 0.3 10*3/uL (ref 0.1–1.0)
Monocytes Relative: 8 %
Neutro Abs: 0.9 10*3/uL — ABNORMAL LOW (ref 1.7–7.7)
Neutrophils Relative %: 26 %
Platelets: 181 10*3/uL (ref 150–400)
RBC: 4.43 MIL/uL (ref 4.22–5.81)
RDW: 12 % (ref 11.5–15.5)
WBC: 3.6 10*3/uL — ABNORMAL LOW (ref 4.0–10.5)
nRBC: 0 % (ref 0.0–0.2)

## 2024-01-06 LAB — COMPREHENSIVE METABOLIC PANEL WITH GFR
ALT: 42 U/L (ref 0–44)
AST: 33 U/L (ref 15–41)
Albumin: 3.4 g/dL — ABNORMAL LOW (ref 3.5–5.0)
Alkaline Phosphatase: 73 U/L (ref 38–126)
Anion gap: 8 (ref 5–15)
BUN: 11 mg/dL (ref 6–20)
CO2: 26 mmol/L (ref 22–32)
Calcium: 8.6 mg/dL — ABNORMAL LOW (ref 8.9–10.3)
Chloride: 102 mmol/L (ref 98–111)
Creatinine, Ser: 0.86 mg/dL (ref 0.61–1.24)
GFR, Estimated: >60 mL/min (ref >60)
Glucose, Bld: 111 mg/dL — ABNORMAL HIGH (ref 70–99)
Potassium: 3.5 mmol/L (ref 3.5–5.1)
Sodium: 136 mmol/L (ref 135–145)
Total Bilirubin: 0.5 mg/dL (ref 0.0–1.2)
Total Protein: 6.6 g/dL (ref 6.5–8.1)

## 2024-01-06 MED ORDER — SODIUM CHLORIDE 0.9% FLUSH: 10.0000 mL | Freq: Two times a day (BID) | INTRAVENOUS | Status: DC

## 2024-01-06 MED ORDER — SODIUM CHLORIDE 0.9% FLUSH: 10.0000 mL | INTRAVENOUS | Status: DC | PRN

## 2024-01-06 MED ORDER — AMOXICILLIN-POT CLAVULANATE 875-125 MG PO TABS
1.0000 | ORAL_TABLET | Freq: Two times a day (BID) | ORAL | 0 refills | Status: DC
  Filled 2024-01-06: qty 10, 5d supply, fill #0

## 2024-01-06 MED ORDER — HEPARIN SOD (PORK) LOCK FLUSH 100 UNIT/ML IV SOLN
500.0000 [IU] | INTRAVENOUS | Status: AC | PRN
  Administered 2024-01-06: 500 [IU]

## 2024-01-06 NOTE — Progress Notes (Signed)
 Asked pt at beginning of shift if he would like to go on one of our CPAP machines. Pt stated he did.  Took machine to room and set it up at 2254 and patient stated he did not want to go on at that time.  Made another attempt at 0010 to place patient on CPAP and he again stated he was not ready. Pt is currently on Room Air and is tolerating well. No distress noted. Will wait for patient to call to be placed on CPAP.  Will continue to monitor pt.

## 2024-01-06 NOTE — Progress Notes (Signed)
 Pt placed on CPAP at this time and is tolerating well. No distress noted. Will continue to monitor pt.     01/06/24 0202  BiPAP/CPAP/SIPAP  BiPAP/CPAP/SIPAP Pt Type Adult  BiPAP/CPAP/SIPAP Resmed  Mask Type Full face mask  Dentures removed? Not applicable  Mask Size Medium  FiO2 (%) 21 %  Patient Home Machine No  Patient Home Mask No  Patient Home Tubing No  Auto Titrate Yes  CPAP/SIPAP surface wiped down Yes  Device Plugged into [employer] Power Outlet Yes

## 2024-01-06 NOTE — Plan of Care (Signed)
 Pt care plans adequate for discharge Meds delivered to bed IV team de- accessed port AVS reviewed with patient Patient wheelchair to discharge lounge with belongings

## 2024-01-06 NOTE — TOC Transition Note (Signed)
 Transition of Care [facility]) - Discharge Note   Patient Details  Name: Paul [name] MRN: [id number] Date of Birth: 08-Aug-1991  Transition [facility]) CM/SW Contact:  Ruben Corolla, RN Phone Number: 01/06/2024, 11:13 AM   Clinical Narrative:   Patient is developmental delay His mother will transport home-please provide meds to bed. No further CM needs.    Final next level of care: Home/Self Care Barriers to Discharge: No Barriers Identified   Patient Goals and CMS Choice Patient states their goals for this hospitalization and ongoing recovery are:: Home CMS Medicare.gov Compare Post Acute Care list provided to:: Patient Represenative (must comment) Choice offered to / list presented to : Parent [place] ownership interest in [facility].provided to:: Parent NA    Discharge Placement                       Discharge Plan and Services Additional resources added to the After Visit Summary for       Post Acute Care Choice: Resumption of Svcs/PTA Provider                               Social Drivers of Health (SDOH) Interventions SDOH Screenings   Food Insecurity: No Food Insecurity (01/05/2024)  Housing: Unknown (01/05/2024)  Transportation Needs: No Transportation Needs (01/05/2024)  Utilities: Not At Risk (01/05/2024)  Financial Resource Strain: Low Risk  (04/09/2021)   [facility] prior to 11/07/2022., [facility] visits prior to 11/07/2022.  Physical Activity: Insufficiently Active (04/09/2021)   Received from [facility] visits prior to 11/07/2022., [facility] visits prior to 11/07/2022.  Social Connections: Socially Isolated (04/09/2021)   Received from [facility] visits prior to 11/07/2022., [facility] visits prior to 11/07/2022.  Stress: No Stress Concern Present (04/09/2021)   Received from [facility] visits prior to 11/07/2022., [facility] visits prior to 11/07/2022.  Tobacco Use: Low Risk  (01/05/2024)     Readmission Risk Interventions     No data to display

## 2024-01-06 NOTE — Discharge Summary (Signed)
 Physician Discharge Summary  Paul [name]:[id number] DOB: 1991-07-20 DOA: 01/03/2024  PCP: Lazoff, Shawn P, DO  Admit date: 01/03/2024 Discharge date: 01/06/2024  Admitted From:  Discharge disposition: Home    Recommendations for Outpatient Follow-Up:   Close follow-up with PCP to ensure resolution of pneumonia   Discharge Diagnosis:   Principal Problem:   Multifocal pneumonia Active Problems:   Severe sepsis (HCC)   Acute hypoxemic respiratory failure (HCC)   AKI (acute kidney injury) (HCC)   Hypokalemia    Discharge Condition: Improved.  Diet recommendation: .  Regular.  Wound care: None.  Code status: Full.   History of Present Illness:   Paul [name] is a 33 y.o. 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  Patient was started on a Rocephin  azithromycin  metronidazole  initially needed 5 L of oxygen.  Currently patient symptoms improved weaned off oxygen.  Metronidazole  de-escalated.  Viral respiratory panel negative.  Urine for Legionella and Streptococcus negative.  If patient continues to do fine possibly can be discharged home on Augmentin .    Hospital Course by Problem:   Severe sepsis secondary to multifocal pneumonia Acute hypoxemic respiratory failure Meets criteria for severe sepsis with fever, tachycardia, tachypnea, lactic acidosis, and acute hypoxemia.  Oxygen saturation 88% on room air, currently requiring 5 L [place] to maintain sats in the 90s.  CTA chest negative for central PE but showing findings compatible with multifocal bronchopneumonia and/or aspiration.  No leukocytosis on labs.  -antigen testing for COVID/flu A and B was negative yesterday. -Mucinex  DM -DuoNeb as  needed -Acetaminophen  as needed for fevers -MRSA PCR screen -Sputum Gram stain and culture -Strep pneumo and Legionella urinary antigens negative -COVID/influenza/RSV PCR.  Complete viral respiratory panel negative -Follow-up blood cultures no growth till date - Patient has been completely weaned off oxygen currently on room air.   AKI Likely prerenal from dehydration.  Creatinine 1.4, baseline 0.8.  Continue IV fluid hydration and monitor renal function.  Avoid nephrotoxic agents. - Creatinine back to baseline.   Hypokalemia Repleted   Emesis Patient had 1 episode of emesis at home yesterday morning.  No longer having any nausea or vomiting at this time.  No abdominal pain or tenderness.  LFTs normal.   Mild hypovolemic hyponatremia Continue IV fluid hydration and monitor labs.   Mild hypocalcemia Continue to monitor labs and replace calcium as needed.   Small pericardial effusion Seen on CT and echo ordered for further evaluation.   Hodgkin's lymphoma on immunotherapy Outpatient oncology follow-up.   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   OSA Continue nightly CPAP.   ADHD/anxiety/depression/narcolepsy Pharmacy med rec pending.    Medical Consultants:      Discharge Exam:   Vitals:   01/05/24 2042 01/06/24 0615  BP: 116/69 114/72  Pulse: 74 76  Resp: 20 18  Temp: 98.2 F (36.8 C) 98 F (36.7 C)  SpO2: 96%    Vitals:   01/05/24 1720 01/05/24 1723 01/05/24 2042 01/06/24 0615  BP:   116/69 114/72  Pulse:   74 76  Resp:   20 18  Temp:   98.2 F (36.8 C) 98 F (36.7 C)  TempSrc:   Oral Axillary  SpO2: 98% 98% 96%   Weight:  Height:        General exam: Appears calm and comfortable.    The results of significant diagnostics from this hospitalization (including imaging, microbiology, ancillary and laboratory) are listed below for reference.     Procedures and Diagnostic Studies:    ECHOCARDIOGRAM COMPLETE Result Date: 01/04/2024    ECHOCARDIOGRAM REPORT   Patient Name:   Paul [name] Date of Exam: 01/04/2024 Medical Rec #:  [id number]       Height:       69.0 in Accession #:    [id number]      Weight:       255.7 lb Date of Birth:  [birth year]       BSA:          2.294 m Patient Age:    32 years        BP:           115/78 mmHg Patient Gender: M               HR:           77 bpm. Exam Location:  Inpatient Procedure: 2D Echo, Color Doppler and Cardiac Doppler (Both Spectral and Color            Flow Doppler were utilized during procedure). Indications:    I31.3 Pericardial effusion (noninflammatory)  History:        Patient has prior history of Echocardiogram examinations, most                 recent 08/17/2023.  Sonographer:    Andrena Bang Referring Phys: [id number] VASUNDHRA RATHORE IMPRESSIONS  1. Left ventricular ejection fraction, by estimation, is 60 to 65%. The left ventricle has normal function. The left ventricle has no regional wall motion abnormalities. Left ventricular diastolic parameters were normal.  2. Right ventricular systolic function is normal. The right ventricular size is normal.  3. The mitral valve is normal in structure. No evidence of mitral valve regurgitation. No evidence of mitral stenosis.  4. The aortic valve is normal in structure. Aortic valve regurgitation is not visualized. No aortic stenosis is present.  5. The inferior vena cava is normal in size with greater than 50% respiratory variability, suggesting right atrial pressure of 3 mmHg. FINDINGS  Left Ventricle: Left ventricular ejection fraction, by estimation, is 60 to 65%. The left ventricle has normal function. The left ventricle has no regional wall motion abnormalities. The left ventricular internal cavity size was normal in size. There is  no left ventricular hypertrophy. Left ventricular diastolic parameters were normal. Right Ventricle: The right ventricular size is normal. No increase in right  ventricular wall thickness. Right ventricular systolic function is normal. Left Atrium: Left atrial size was normal in size. Right Atrium: Right atrial size was normal in size. Pericardium: Trivial pericardial effusion is present. The pericardial effusion is localized near the right ventricle. Mitral Valve: The mitral valve is normal in structure. No evidence of mitral valve regurgitation. No evidence of mitral valve stenosis. Tricuspid Valve: The tricuspid valve is normal in structure. Tricuspid valve regurgitation is not demonstrated. No evidence of tricuspid stenosis. Aortic Valve: The aortic valve is normal in structure. Aortic valve regurgitation is not visualized. No aortic stenosis is present. Aortic valve mean gradient measures 3.0 mmHg. Aortic valve peak gradient measures 5.3 mmHg. Aortic valve area, by VTI measures 4.04 cm. Pulmonic Valve: The pulmonic valve was normal in structure. Pulmonic valve regurgitation is not visualized. No evidence of pulmonic stenosis. Aorta:  The aortic root is normal in size and structure. Venous: The inferior vena cava is normal in size with greater than 50% respiratory variability, suggesting right atrial pressure of 3 mmHg. IAS/Shunts: No atrial level shunt detected by color flow Doppler.  LEFT VENTRICLE PLAX 2D LVIDd:         4.30 cm      Diastology LVIDs:         2.90 cm      LV e' medial:    10.90 cm/s LV PW:         1.10 cm      LV E/e' medial:  9.4 LV IVS:        0.80 cm      LV e' lateral:   11.70 cm/s LVOT diam:     2.40 cm      LV E/e' lateral: 8.8 LV SV:         95 LV SV Index:   41 LVOT Area:     4.52 cm  LV Volumes (MOD) LV vol d, MOD A2C: 101.0 ml LV vol d, MOD A4C: 99.3 ml LV vol s, MOD A2C: 35.0 ml LV vol s, MOD A4C: 38.3 ml LV SV MOD A2C:     66.0 ml LV SV MOD A4C:     99.3 ml LV SV MOD BP:      66.5 ml RIGHT VENTRICLE RV S prime:     12.00 cm/s TAPSE (M-mode): 1.4 cm LEFT ATRIUM             Index LA Vol (A2C):   40.5 ml 17.66 ml/m LA Vol (A4C):   17.2 ml  7.50 ml/m LA Biplane Vol: 26.2 ml 11.42 ml/m  AORTIC VALVE AV Area (Vmax):    4.33 cm AV Area (Vmean):   3.46 cm AV Area (VTI):     4.04 cm AV Vmax:           115.00 cm/s AV Vmean:          85.100 cm/s AV VTI:            0.234 m AV Peak Grad:      5.3 mmHg AV Mean Grad:      3.0 mmHg LVOT Vmax:         110.00 cm/s LVOT Vmean:        65.000 cm/s LVOT VTI:          0.209 m LVOT/AV VTI ratio: 0.89  AORTA Ao Asc diam: 2.70 cm MITRAL VALVE MV Area (PHT): 4.15 cm     SHUNTS MV Decel Time: 183 msec     Systemic VTI:  0.21 m MV E velocity: 103.00 cm/s  Systemic Diam: 2.40 cm MV A velocity: 96.10 cm/s MV E/A ratio:  1.07 Mihai Croitoru MD Electronically signed by Luana Rumple MD Signature Date/Time: 01/04/2024/10:25:59 AM    Final    CT Angio Chest PE W/Cm &/Or Wo Cm Result Date: 01/03/2024 CLINICAL DATA:  Shortness of breath.  PE suspected. EXAM: CT ANGIOGRAPHY CHEST WITH CONTRAST TECHNIQUE: Multidetector CT imaging of the chest was performed using the standard protocol during bolus administration of intravenous contrast. Multiplanar CT image reconstructions and MIPs were obtained to evaluate the vascular anatomy. RADIATION DOSE REDUCTION: This exam was performed according to the departmental dose-optimization program which includes automated exposure control, adjustment of the mA and/or kV according to patient size and/or use of iterative reconstruction technique. CONTRAST:  75mL OMNIPAQUE  IOHEXOL  350 MG/ML SOLN COMPARISON:  Same day chest  radiograph; PET/CT 12/02/2023 and CT chest 07/01/2022 FINDINGS: Cardiovascular: Motion compromises evaluation of the segmental and subsegmental pulmonary arteries. No central pulmonary embolism. Normal caliber thoracic aorta. Small pericardial effusion. Mediastinum/Nodes: Layering debris in the trachea. Esophagus is unremarkable. Slightly increased mediastinal and hilar adenopathy compared to 12/02/2023. For example a 18 mm prevascular node (series 5/image 47) previously  measured 16 mm; a left paratracheal node (5/46) measures 14 mm, previously 7 mm; a 12 mm right paratracheal node (5/39) previously measured 10 mm. Bilateral hilar adenopathy measuring up to 12 mm on the right (5/55) and 13 mm on the left (5/59). Soft tissue thickening anterior to the SVC (5/62) has increased measuring 26 mm, previously 24 mm. Lungs/Pleura: New patchy bilateral peribronchovascular consolidation. Diffuse bronchial wall thickening. Bilateral centrilobular micro nodules. Trace right pleural effusion. No pneumothorax. Upper Abdomen: No acute abnormality. Musculoskeletal: No acute fracture or destructive osseous lesion. Review of the MIP images confirms the above findings. IMPRESSION: 1. Motion compromises evaluation of the segmental and subsegmental pulmonary arteries. No central pulmonary embolism. 2. Pulmonary findings most compatible with multifocal bronchopneumonia and/or aspiration. 3. Slightly increased mediastinal and hilar adenopathy compared to 12/02/2023. 4. Trace right pleural effusion. 5. Small pericardial effusion. Electronically Signed   By: Rozell Cornet M.D.   On: 01/03/2024 23:11   DG Chest Port 1 View Result Date: 01/03/2024 EXAM: 1 VIEW(S) XRAY OF THE CHEST 01/03/2024 07:15:30 PM COMPARISON: 05/14/2023 CLINICAL HISTORY: 141880 SOB (shortness of breath) 141880. Pt c/o SOB for 3x days. Given a duo-neb by ems. Did have 1 episode of emesis the AM. H/o asthma. FINDINGS: LUNGS AND PLEURA: Mild patchy perihilar and lower lobe opacities, favoring mild infection/pneumonia over interstitial edema. No pleural effusion. No pneumothorax. HEART AND MEDIASTINUM: No acute abnormality of the cardiac and mediastinal silhouettes. BONES AND SOFT TISSUES: No acute osseous abnormality. LINES AND TUBES: Right chest port terminates at the cavoatrial junction in appropriate position. IMPRESSION: 1. Mild patchy perihilar and lower lobe opacities, favoring mild infection/pneumonia over interstitial edema.  Electronically signed by: Zadie Herter MD 01/03/2024 07:49 PM EDT RP Workstation: [id number]     Labs:   Basic Metabolic Panel: Recent Labs  Lab 01/03/24 1854 01/04/24 0116 01/04/24 0455 01/05/24 0350  NA 133*  --  134* 138  K 2.8*  --  4.3 3.8  CL 98  --  101 104  CO2 24  --  23 24  GLUCOSE 166*  --  171* 103*  BUN 18  --  16 17  CREATININE 1.42*  --  0.79 0.57*  CALCIUM 8.2*  --  8.5* 8.2*  MG  --  1.9  --   --    GFR Estimated Creatinine Clearance: 166.3 mL/min (A) (by C-G formula based on SCr of 0.57 mg/dL (L)). Liver Function Tests: Recent Labs  Lab 01/03/24 1854 01/05/24 0350  AST 33 40  ALT 30 41  ALKPHOS 88 74  BILITOT 0.6 0.6  PROT 6.9 6.7  ALBUMIN 3.7 3.5   No results for input(s): "LIPASE", "AMYLASE" in the last 168 hours. No results for input(s): "AMMONIA" in the last 168 hours. Coagulation profile No results for input(s): "INR", "PROTIME" in the last 168 hours.  CBC: Recent Labs  Lab 01/03/24 1854 01/04/24 0455 01/05/24 0350 01/06/24 1015  WBC 4.5 2.8* 5.3 3.6*  NEUTROABS 2.8  --  2.4 PENDING  HGB 13.3 12.8* 12.9* 13.4  HCT 40.6 38.4* 40.6 40.3  MCV 93.1 92.5 94.2 91.0  PLT 150 144* 159 181   Cardiac  Enzymes: No results for input(s): "CKTOTAL", "CKMB", "CKMBINDEX", "TROPONINI" in the last 168 hours. BNP: Invalid input(s): "POCBNP" CBG: No results for input(s): "GLUCAP" in the last 168 hours. D-Dimer No results for input(s): "DDIMER" in the last 72 hours. Hgb A1c No results for input(s): "HGBA1C" in the last 72 hours. Lipid Profile No results for input(s): "CHOL", "HDL", "LDLCALC", "TRIG", "CHOLHDL", "LDLDIRECT" in the last 72 hours. Thyroid  function studies No results for input(s): "TSH", "T4TOTAL", "T3FREE", "THYROIDAB" in the last 72 hours.  Invalid input(s): "FREET3" Anemia work up No results for input(s): "VITAMINB12", "FOLATE", "FERRITIN", "TIBC", "IRON", "RETICCTPCT" in the last 72 hours. Microbiology Recent Results  (from the past 240 hours)  Blood culture (routine x 2)     Status: None (Preliminary result)   Collection Time: 01/03/24  6:54 PM   Specimen: BLOOD  Result Value Ref Range Status   Specimen Description   Final    BLOOD SITE NOT SPECIFIED Performed at [facility], [address] [place], [place] [address]    Special Requests   Final    BOTTLES DRAWN AEROBIC AND ANAEROBIC Blood Culture adequate volume Performed at [facility], [address] [place], [place] [address]    Culture   Final    NO GROWTH 3 DAYS Performed at [facility], [address], [place], [place] [address]    Report Status PENDING  Incomplete  Resp panel by RT-PCR (RSV, Flu A&B, Covid) Anterior Nasal Swab     Status: None   Collection Time: 01/04/24  5:07 AM   Specimen: Anterior Nasal Swab  Result Value Ref Range Status   SARS Coronavirus 2 by RT PCR NEGATIVE NEGATIVE Final    Comment: (NOTE) SARS-CoV-2 target nucleic acids are NOT DETECTED.  The SARS-CoV-2 RNA is generally detectable in upper respiratory specimens during the acute phase of infection. The lowest concentration of SARS-CoV-2 viral copies this assay can detect is 138 copies/mL. A negative result does not preclude SARS-Cov-2 infection and should not be used as the sole basis for treatment or other patient management decisions. A negative result may occur with  improper specimen collection/handling, submission of specimen other than nasopharyngeal swab, presence of viral mutation(s) within the areas targeted by this assay, and inadequate number of viral copies(<138 copies/mL). A negative result must be combined with clinical observations, patient history, and epidemiological information. The expected result is Negative.  Fact Sheet for Patients:  BloggerCourse.com  Fact Sheet for Healthcare Providers:  SeriousBroker.it  This test is no t yet  approved or cleared by the [place]  FDA and  has been authorized for detection and/or diagnosis of SARS-CoV-2 by FDA under an Emergency Use Authorization (EUA). This EUA will remain  in effect (meaning this test can be used) for the duration of the COVID-19 declaration under Section 564(b)(1) of the Act, 21 U.S.C.section 360bbb-3(b)(1), unless the authorization is terminated  or revoked sooner.       Influenza A by PCR NEGATIVE NEGATIVE Final   Influenza B by PCR NEGATIVE NEGATIVE Final    Comment: (NOTE) The Xpert Xpress SARS-CoV-2/FLU/RSV plus assay is intended as an aid in the diagnosis of influenza from Nasopharyngeal swab specimens and should not be used as a sole basis for treatment. Nasal washings and aspirates are unacceptable for Xpert Xpress SARS-CoV-2/FLU/RSV testing.  Fact Sheet for Patients: BloggerCourse.com  Fact Sheet for Healthcare Providers: SeriousBroker.it  This test is not yet approved or cleared by the [place]  FDA and has been  authorized for detection and/or diagnosis of SARS-CoV-2 by FDA under an Emergency Use Authorization (EUA). This EUA will remain in effect (meaning this test can be used) for the duration of the COVID-19 declaration under Section 564(b)(1) of the Act, 21 U.S.C. section 360bbb-3(b)(1), unless the authorization is terminated or revoked.     Resp Syncytial Virus by PCR NEGATIVE NEGATIVE Final    Comment: (NOTE) Fact Sheet for Patients: BloggerCourse.com  Fact Sheet for Healthcare Providers: SeriousBroker.it  This test is not yet approved or cleared by the [place]  FDA and has been authorized for detection and/or diagnosis of SARS-CoV-2 by FDA under an Emergency Use Authorization (EUA). This EUA will remain in effect (meaning this test can be used) for the duration of the COVID-19 declaration under Section 564(b)(1) of  the Act, 21 U.S.C. section 360bbb-3(b)(1), unless the authorization is terminated or revoked.  Performed at [facility], [address] [place], [place] [address]   MRSA Next Gen by PCR, Nasal     Status: None   Collection Time: 01/04/24  5:07 AM   Specimen: Anterior Nasal Swab  Result Value Ref Range Status   MRSA by PCR Next Gen NOT DETECTED NOT DETECTED Final    Comment: (NOTE) The GeneXpert MRSA Assay (FDA approved for NASAL specimens only), is one component of a comprehensive MRSA colonization surveillance program. It is not intended to diagnose MRSA infection nor to guide or monitor treatment for MRSA infections. Test performance is not FDA approved in patients less than 59 years old. Performed at [facility], [address], [place], [place] [address]   MRSA Next Gen by PCR, Nasal     Status: None   Collection Time: 01/04/24  5:06 PM   Specimen: Nasal Mucosa; Nasal Swab  Result Value Ref Range Status   MRSA by PCR Next Gen NOT DETECTED NOT DETECTED Final    Comment: (NOTE) The GeneXpert MRSA Assay (FDA approved for NASAL specimens only), is one component of a comprehensive MRSA colonization surveillance program. It is not intended to diagnose MRSA infection nor to guide or monitor treatment for MRSA infections. Test performance is not FDA approved in patients less than 104 years old. Performed at [facility], [address], [place], [place] [address]      Discharge Instructions:   Discharge Instructions     Diet general   Complete by: As directed    Increase activity slowly   Complete by: As directed       Allergies as of 01/06/2024       Reactions   Gabapentin     MAKES HIM GO INTO A DEEP SLEEP   Modafinil Other (See Comments)   aggression   Other Other (See Comments)   Fetal alcohol syndrome Certain (unnamed) medications have adverse effects on him. 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        Medication List     TAKE these medications    albuterol  108 (90 Base) MCG/ACT inhaler Commonly known as: VENTOLIN  HFA Inhale 2 puffs into the lungs every 6 (six) hours as needed for wheezing or shortness of breath.   albuterol  (5 MG/ML) 0.5% nebulizer solution Commonly known as: PROVENTIL  Take 2.5 mg by nebulization every 6 (six) hours as needed for wheezing or shortness of breath.   amoxicillin -clavulanate 875-125 MG tablet Commonly known as: AUGMENTIN  Take 1 tablet by mouth every 12 (twelve) hours. What changed: when to take this   guaiFENesin   100 MG/5ML liquid Commonly known as: ROBITUSSIN Take 5 mLs by mouth every 4 (four) hours as needed for cough or to loosen phlegm.   ibuprofen  800 MG tablet Commonly known as: ADVIL  Take 800 mg by mouth every 8 (eight) hours as needed for mild pain or headache.   LIDOCAINE  EX Apply 1 Application topically as needed (Before immunotherapy).   multivitamin with minerals Tabs tablet Take 1 tablet by mouth daily with breakfast.   ondansetron  4 MG tablet Commonly known as: ZOFRAN  Take 1 tablet (4 mg total) by mouth every 6 (six) hours as needed for nausea.   Oxcarbazepine  300 MG tablet Commonly known as: TRILEPTAL  Take 600 mg by mouth daily.   prochlorperazine  10 MG tablet Commonly known as: COMPAZINE  Take 1 tablet (10 mg total) by mouth every 6 (six) hours as needed for nausea or vomiting.   Vitamin D  (Ergocalciferol ) 1.25 MG (50000 UNIT) Caps capsule Commonly known as: DRISDOL  Take 50,000 Units by mouth every Monday.          Time coordinating discharge: 45 min  Signed:  Enrigue Harvard DO  [facility] 01/06/2024, 10:43 AM

## 2024-01-07 LAB — LEGIONELLA PNEUMOPHILA SEROGP 1 UR AG: L. pneumophila Serogp 1 Ur Ag: NEGATIVE

## 2024-01-08 LAB — CULTURE, BLOOD (ROUTINE X 2)
Culture: NO GROWTH
Special Requests: ADEQUATE

## 2024-01-12 ENCOUNTER — Ambulatory Visit: Payer: Medicare HMO

## 2024-01-12 ENCOUNTER — Ambulatory Visit: Payer: Medicare HMO | Admitting: Hematology

## 2024-01-12 ENCOUNTER — Other Ambulatory Visit: Payer: Medicare HMO

## 2024-01-17 ENCOUNTER — Other Ambulatory Visit: Payer: Self-pay

## 2024-01-17 ENCOUNTER — Ambulatory Visit: Payer: Self-pay | Admitting: Hematology

## 2024-01-17 ENCOUNTER — Ambulatory Visit: Payer: Self-pay

## 2024-01-18 NOTE — Progress Notes (Signed)
 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 male who has been referred to us  by .Lazoff, Shawn P, DO and Dr. Artemio Bilberry MD for evaluation and management of newly diagnosed mediastinal mass concerning for likely lymphoma.  Patient has a history of fetal alcohol syndrome with developmental delay, narcolepsy, ADD, exercise-induced asthma and lives with his adoptive parents. He recently presented to the hospital on 07/01/2022 with 2-week history of worsening shortness of breath cough and wheezing.  He received his asthma treatment but was still noted to have significant cough.  In the emergency room he had a chest x-ray which showed an mediastinal mass. Subsequent CT chest with contrast on 07/01/2022 showed extensive mediastinal and hilar lymphadenopathy with conglomerate soft tissue mass/adenopathy within the anterior mediastinum measuring 11.2 x 7.9 x 13 cm. Compression of the left brachiocephalic and SVC within the mid though these vascular structures remain patent.  Patient did not have any clinical signs or symptoms of SVC compression syndrome or left upper extremity swelling.  Patient subsequently had a CT of the abdomen and pelvis which showed no acute intra-abdominal or intrapelvic abnormalities. He underwent a CT-guided core needle biopsy of the mediastinal mass by interventional radiology.  The official pathology results from his biopsy are not currently available at the time of this clinic visit. I did call and talk to the pathologist Dr. Almeda Jacobs and she notes that this either looks like a lymphoma or thymoma and she is running additional tests to make a final  diagnosis.  Patient's father accompanied him for this visit and his mother who helps make most of the decisions in tandem with his father was present on the phone.  They do not note any unexplained fevers chills night sweats or significant weight loss.  His breathing has been relatively stable since hospital discharge but he is still having some cough.  I confirmed adequacy of tissue sampling with the pathologist and the patient was then started on prednisone  to try to help his cough by drinking his mediastinal tumor some and reducing bronchial inflammation.  Patient is unable to provide much information or review of systems on account of his developmental delay issues.  Interval History:   Paul [name] is a wonderful 33 y.o. male who is here for continued evaluation and management of classical hogkins lymphoma. He is here to receive cycle 12 day 1 of his treatment.  Patient was last seen by me on 11/22/2023 and reported one episode of nausea and aggression issues due to Modafinil, which he stopped.  He was seen by [facility] on 12/22/2023 and reported fatigue, weight gain, and wound to left hand due to cat bite.   He presented to the ED on 01/03/2024 for multifocal pneumonia with severe sepsis, acute hypoxemic respiratory failure, AKI, and hypokalemia.   Patient's mother is also on call during today's visit.   Patient's mother reports that patient had pneumonia infection 3 weeks ago, which lasted 1 week and resolved with antibiotics. It is noted that there is some uncertainty in regards to whether his pneumonia infection was community-acquired or if there was concern for aspiration.  Patient reports that he is feeling better since recovering from pneumonia.   Patient denies  any aspiration issues and he reports that his breathing habits have improved. He denies any difficulty swallowing foods, new lumps/bumps, or leg swelling.   He reports that he has he has been feeling sleepy sometimes.    Patient is generally eating well.   He has tolerated his last immunotherapy with no new or major toxicity issues.   MEDICAL HISTORY:  Past Medical History:  Diagnosis Date   ADD (attention deficit disorder)    ADHD (attention deficit hyperactivity disorder)    Asthma    excercise induced and pollen   Auditory processing disorder    Complication of anesthesia    mother states pt. is harder to put under anes. and hard to wake up due to fetal alcohol syndrome; can be combative, per mother; states he will do better if mother is in PACU when he is coming out of anes.   Development delay    states mental status of a 33 year old   Exercise-induced asthma    prn inhaler/neb.   Fetal alcohol syndrome    Narcolepsy    Non-restorable tooth 09/2015   teeth   Separation anxiety    Twin birth     SURGICAL HISTORY: Past Surgical History:  Procedure Laterality Date   IR IMAGING GUIDED PORT INSERTION  08/11/2022   MULTIPLE EXTRACTIONS WITH ALVEOLOPLASTY N/A 09/23/2015   Procedure: MULTIPLE EXTRACTION WITH ALVEOLOPLASTY;  Surgeon: Ascencion Lava, DDS;  Location: [facility];  Service: Oral Surgery;  Laterality: N/A;   PYLOROMYOTOMY     age [age], performed found to not have stenosis on surgical exam   PYLOROMYOTOMY     did not have pyloric stenosis; did surgery on the wrong twin   RADIOLOGY WITH ANESTHESIA N/A 01/16/2020   Procedure: MRI WITH ANESTHESIA  BRAIN WITH AND WITHOUT CONTRAST;  Surgeon: Radiologist, Medication, MD;  Location: [facility] OR;  Service: Radiology;  Laterality: N/A;    SOCIAL HISTORY: Social History   Socioeconomic History   Marital status: Single    Spouse name: Not on file   Number of children: Not on file   Years of education: Not on file   Highest education level: Not on file  Occupational History   Not on file  Tobacco Use   Smoking status: Never   Smokeless tobacco: Never  Vaping Use   Vaping status: Never Used  Substance and Sexual Activity    Alcohol use: No   Drug use: No   Sexual activity: Not on file  Other Topics Concern   Not on file  Social History Narrative   ** Merged History Encounter **       Social Drivers of Health   Financial Resource Strain: Low Risk  (04/09/2021)   Received from [facility] visits prior to 11/07/2022., [facility] visits prior to 11/07/2022.   Overall Financial Resource Strain (CARDIA)    Difficulty of Paying Living Expenses: Not hard at all  Food Insecurity: No Food Insecurity (01/05/2024)   Hunger Vital Sign    Worried About Running Out of Food in the Last Year: Never true    Ran Out of Food in the Last Year: Never true  Transportation Needs: No Transportation Needs (01/05/2024)   PRAPARE - Administrator, Civil Service (Medical): No    Lack of Transportation (Non-Medical): No  Physical Activity: Insufficiently Active (04/09/2021)   Received from [facility] visits prior to 11/07/2022., [facility] visits prior to  11/07/2022.   Exercise Vital Sign    Days of Exercise per Week: 5 days    Minutes of Exercise per Session: 10 min  Stress: No Stress Concern Present (04/09/2021)   Received from [facility] visits prior to 11/07/2022., [facility] visits prior to 11/07/2022.   [employer] of Occupational Health - Occupational Stress Questionnaire    Feeling of Stress : Only a little  Social Connections: Socially Isolated (04/09/2021)   Received from [facility] visits prior to 11/07/2022., [facility] visits prior to 11/07/2022.   Social Advertising account executive [NHANES]    Frequency of Communication with Friends and Family: Never    Frequency of Social Gatherings with Friends and Family: Once a week    Attends Religious Services: Never    Database administrator or Organizations: Yes    Attends Hospital doctor: More than 4 times per year    Marital Status: Never married  Intimate Partner Violence: Not At Risk (01/05/2024)   Humiliation, Afraid, Rape, and Kick questionnaire    Fear of Current or Ex-Partner: No    Emotionally Abused: No    Physically Abused: No    Sexually Abused: No    FAMILY HISTORY: Family History  Adopted: Yes  Problem Relation Age of Onset   Mental illness Mother    Diabetes Mother    Hypertension Mother    Schizophrenia Mother    Bipolar disorder Mother    Mental retardation Father    Sleep apnea Neg Hx     ALLERGIES:  is allergic to gabapentin , modafinil, and other.  MEDICATIONS:  Current Outpatient Medications  Medication Sig Dispense Refill   albuterol  (PROVENTIL  HFA;VENTOLIN  HFA) 108 (90 BASE) MCG/ACT inhaler Inhale 2 puffs into the lungs every 6 (six) hours as needed for wheezing or shortness of breath.     albuterol  (PROVENTIL ) (5 MG/ML) 0.5% nebulizer solution Take 2.5 mg by nebulization every 6 (six) hours as needed for wheezing or shortness of breath.     amoxicillin -clavulanate (AUGMENTIN ) 875-125 MG tablet Take 1 tablet by mouth every 12 (twelve) hours. 10 tablet 0   guaiFENesin  (ROBITUSSIN) 100 MG/5ML liquid Take 5 mLs by mouth every 4 (four) hours as needed for cough or to loosen phlegm. 120 mL 0   ibuprofen  (ADVIL ) 800 MG tablet Take 800 mg by mouth every 8 (eight) hours as needed for mild pain or headache.     LIDOCAINE  EX Apply 1 Application topically as needed (Before immunotherapy).     Multiple Vitamin (MULTIVITAMIN WITH MINERALS) TABS tablet Take 1 tablet by mouth daily with breakfast.     ondansetron  (ZOFRAN ) 4 MG tablet Take 1 tablet (4 mg total) by mouth every 6 (six) hours as needed for nausea. 20 tablet 0   Oxcarbazepine  (TRILEPTAL ) 300 MG tablet Take 600 mg by mouth daily.     prochlorperazine  (COMPAZINE ) 10 MG tablet Take 1 tablet (10 mg total) by mouth every 6 (six) hours as needed for nausea or vomiting. 30 tablet 1   Vitamin  D, Ergocalciferol , (DRISDOL ) 1.25 MG (50000 UNIT) CAPS capsule Take 50,000 Units by mouth every Monday.     No current facility-administered medications for this visit.    REVIEW OF SYSTEMS:    10 Point review of Systems was done is negative except as noted above.   PHYSICAL EXAMINATION  .BP 101/73 (BP Location: Left Arm, Patient Position: Sitting)   Pulse  77   Temp 98 F (36.7 C) (Tympanic)   Resp 18   Ht 5\' 9"  (1.753 m)   Wt 254 lb 4.8 oz (115.3 kg)   SpO2 97%   BMI 37.55 kg/m  GENERAL:alert, in no acute distress and comfortable SKIN: no acute rashes, no significant lesions EYES: conjunctiva are pink and non-injected, sclera anicteric OROPHARYNX: MMM, no exudates, no oropharyngeal erythema or ulceration NECK: supple, no JVD LYMPH:  no palpable lymphadenopathy in the cervical, axillary or inguinal regions LUNGS: clear to auscultation b/l with normal respiratory effort HEART: regular rate & rhythm ABDOMEN:  normoactive bowel sounds , non tender, not distended. Extremity: no pedal edema PSYCH: alert & oriented x 3 with fluent speech NEURO: no focal motor/sensory deficits   LABORATORY DATA:  I have reviewed the data as listed  .Aaron Aas    Latest Ref Rng & Units 01/19/2024    2:01 PM 01/06/2024   10:15 AM 01/05/2024    3:50 AM  CBC  WBC 4.0 - 10.5 K/uL 4.9  3.6  5.3   Hemoglobin 13.0 - 17.0 g/dL 16.1  09.6  04.5   Hematocrit 39.0 - 52.0 % 37.9  40.3  40.6   Platelets 150 - 400 K/uL 233  181  159    .    Latest Ref Rng & Units 01/19/2024    2:01 PM 01/06/2024   10:15 AM 01/05/2024    3:50 AM  CMP  Glucose 70 - 99 mg/dL 409  811  914   BUN 6 - 20 mg/dL 16  11  17    Creatinine 0.61 - 1.24 mg/dL 7.82  9.56  2.13   Sodium 135 - 145 mmol/L 138  136  138   Potassium 3.5 - 5.1 mmol/L 3.7  3.5  3.8   Chloride 98 - 111 mmol/L 105  102  104   CO2 22 - 32 mmol/L 29  26  24    Calcium 8.9 - 10.3 mg/dL 9.2  8.6  8.2   Total Protein 6.5 - 8.1 g/dL 6.7  6.6  6.7   Total Bilirubin 0.0 -  1.2 mg/dL 0.4  0.5  0.6   Alkaline Phos 38 - 126 U/L 101  73  74   AST 15 - 41 U/L 22  33  40   ALT 0 - 44 U/L 34  42  41    . Lab Results  Component Value Date   LDH 171 07/03/2022     RADIOGRAPHIC STUDIES: I have personally reviewed the radiological images as listed and agreed with the findings in the report. ECHOCARDIOGRAM COMPLETE Result Date: 01/04/2024    ECHOCARDIOGRAM REPORT   Patient Name:   TYLIEK TIMBERMAN Date of Exam: 01/04/2024 Medical Rec #:  [id number]       Height:       69.0 in Accession #:    [id number]      Weight:       255.7 lb Date of Birth:  Jan 10, 1991       BSA:          2.294 m Patient Age:    32 years        BP:           115/78 mmHg Patient Gender: M               HR:           77 bpm. Exam Location:  Inpatient Procedure: 2D Echo, Color Doppler and Cardiac Doppler (Both  Spectral and Color            Flow Doppler were utilized during procedure). Indications:    I31.3 Pericardial effusion (noninflammatory)  History:        Patient has prior history of Echocardiogram examinations, most                 recent 08/17/2023.  Sonographer:    Andrena Bang Referring Phys: [id number] VASUNDHRA RATHORE IMPRESSIONS  1. Left ventricular ejection fraction, by estimation, is 60 to 65%. The left ventricle has normal function. The left ventricle has no regional wall motion abnormalities. Left ventricular diastolic parameters were normal.  2. Right ventricular systolic function is normal. The right ventricular size is normal.  3. The mitral valve is normal in structure. No evidence of mitral valve regurgitation. No evidence of mitral stenosis.  4. The aortic valve is normal in structure. Aortic valve regurgitation is not visualized. No aortic stenosis is present.  5. The inferior vena cava is normal in size with greater than 50% respiratory variability, suggesting right atrial pressure of 3 mmHg. FINDINGS  Left Ventricle: Left ventricular ejection fraction, by estimation, is 60 to 65%. The left  ventricle has normal function. The left ventricle has no regional wall motion abnormalities. The left ventricular internal cavity size was normal in size. There is  no left ventricular hypertrophy. Left ventricular diastolic parameters were normal. Right Ventricle: The right ventricular size is normal. No increase in right ventricular wall thickness. Right ventricular systolic function is normal. Left Atrium: Left atrial size was normal in size. Right Atrium: Right atrial size was normal in size. Pericardium: Trivial pericardial effusion is present. The pericardial effusion is localized near the right ventricle. Mitral Valve: The mitral valve is normal in structure. No evidence of mitral valve regurgitation. No evidence of mitral valve stenosis. Tricuspid Valve: The tricuspid valve is normal in structure. Tricuspid valve regurgitation is not demonstrated. No evidence of tricuspid stenosis. Aortic Valve: The aortic valve is normal in structure. Aortic valve regurgitation is not visualized. No aortic stenosis is present. Aortic valve mean gradient measures 3.0 mmHg. Aortic valve peak gradient measures 5.3 mmHg. Aortic valve area, by VTI measures 4.04 cm. Pulmonic Valve: The pulmonic valve was normal in structure. Pulmonic valve regurgitation is not visualized. No evidence of pulmonic stenosis. Aorta: The aortic root is normal in size and structure. Venous: The inferior vena cava is normal in size with greater than 50% respiratory variability, suggesting right atrial pressure of 3 mmHg. IAS/Shunts: No atrial level shunt detected by color flow Doppler.  LEFT VENTRICLE PLAX 2D LVIDd:         4.30 cm      Diastology LVIDs:         2.90 cm      LV e' medial:    10.90 cm/s LV PW:         1.10 cm      LV E/e' medial:  9.4 LV IVS:        0.80 cm      LV e' lateral:   11.70 cm/s LVOT diam:     2.40 cm      LV E/e' lateral: 8.8 LV SV:         95 LV SV Index:   41 LVOT Area:     4.52 cm  LV Volumes (MOD) LV vol d, MOD A2C:  101.0 ml LV vol d, MOD A4C: 99.3 ml LV vol s, MOD A2C: 35.0 ml LV vol s,  MOD A4C: 38.3 ml LV SV MOD A2C:     66.0 ml LV SV MOD A4C:     99.3 ml LV SV MOD BP:      66.5 ml RIGHT VENTRICLE RV S prime:     12.00 cm/s TAPSE (M-mode): 1.4 cm LEFT ATRIUM             Index LA Vol (A2C):   40.5 ml 17.66 ml/m LA Vol (A4C):   17.2 ml 7.50 ml/m LA Biplane Vol: 26.2 ml 11.42 ml/m  AORTIC VALVE AV Area (Vmax):    4.33 cm AV Area (Vmean):   3.46 cm AV Area (VTI):     4.04 cm AV Vmax:           115.00 cm/s AV Vmean:          85.100 cm/s AV VTI:            0.234 m AV Peak Grad:      5.3 mmHg AV Mean Grad:      3.0 mmHg LVOT Vmax:         110.00 cm/s LVOT Vmean:        65.000 cm/s LVOT VTI:          0.209 m LVOT/AV VTI ratio: 0.89  AORTA Ao Asc diam: 2.70 cm MITRAL VALVE MV Area (PHT): 4.15 cm     SHUNTS MV Decel Time: 183 msec     Systemic VTI:  0.21 m MV E velocity: 103.00 cm/s  Systemic Diam: 2.40 cm MV A velocity: 96.10 cm/s MV E/A ratio:  1.07 Mihai Croitoru MD Electronically signed by Luana Rumple MD Signature Date/Time: 01/04/2024/10:25:59 AM    Final    CT Angio Chest PE W/Cm &/Or Wo Cm Result Date: 01/03/2024 CLINICAL DATA:  Shortness of breath.  PE suspected. EXAM: CT ANGIOGRAPHY CHEST WITH CONTRAST TECHNIQUE: Multidetector CT imaging of the chest was performed using the standard protocol during bolus administration of intravenous contrast. Multiplanar CT image reconstructions and MIPs were obtained to evaluate the vascular anatomy. RADIATION DOSE REDUCTION: This exam was performed according to the departmental dose-optimization program which includes automated exposure control, adjustment of the mA and/or kV according to patient size and/or use of iterative reconstruction technique. CONTRAST:  75mL OMNIPAQUE  IOHEXOL  350 MG/ML SOLN COMPARISON:  Same day chest radiograph; PET/CT 12/02/2023 and CT chest 07/01/2022 FINDINGS: Cardiovascular: Motion compromises evaluation of the segmental and subsegmental pulmonary  arteries. No central pulmonary embolism. Normal caliber thoracic aorta. Small pericardial effusion. Mediastinum/Nodes: Layering debris in the trachea. Esophagus is unremarkable. Slightly increased mediastinal and hilar adenopathy compared to 12/02/2023. For example a 18 mm prevascular node (series 5/image 47) previously measured 16 mm; a left paratracheal node (5/46) measures 14 mm, previously 7 mm; a 12 mm right paratracheal node (5/39) previously measured 10 mm. Bilateral hilar adenopathy measuring up to 12 mm on the right (5/55) and 13 mm on the left (5/59). Soft tissue thickening anterior to the SVC (5/62) has increased measuring 26 mm, previously 24 mm. Lungs/Pleura: New patchy bilateral peribronchovascular consolidation. Diffuse bronchial wall thickening. Bilateral centrilobular micro nodules. Trace right pleural effusion. No pneumothorax. Upper Abdomen: No acute abnormality. Musculoskeletal: No acute fracture or destructive osseous lesion. Review of the MIP images confirms the above findings. IMPRESSION: 1. Motion compromises evaluation of the segmental and subsegmental pulmonary arteries. No central pulmonary embolism. 2. Pulmonary findings most compatible with multifocal bronchopneumonia and/or aspiration. 3. Slightly increased mediastinal and hilar adenopathy compared to 12/02/2023. 4. Trace right pleural effusion.  5. Small pericardial effusion. Electronically Signed   By: Rozell Cornet M.D.   On: 01/03/2024 23:11   DG Chest Port 1 View Result Date: 01/03/2024 EXAM: 1 VIEW(S) XRAY OF THE CHEST 01/03/2024 07:15:30 PM COMPARISON: 05/14/2023 CLINICAL HISTORY: 141880 SOB (shortness of breath) 141880. Pt c/o SOB for 3x days. Given a duo-neb by ems. Did have 1 episode of emesis the AM. H/o asthma. FINDINGS: LUNGS AND PLEURA: Mild patchy perihilar and lower lobe opacities, favoring mild infection/pneumonia over interstitial edema. No pleural effusion. No pneumothorax. HEART AND MEDIASTINUM: No acute  abnormality of the cardiac and mediastinal silhouettes. BONES AND SOFT TISSUES: No acute osseous abnormality. LINES AND TUBES: Right chest port terminates at the cavoatrial junction in appropriate position. IMPRESSION: 1. Mild patchy perihilar and lower lobe opacities, favoring mild infection/pneumonia over interstitial edema. Electronically signed by: Zadie Herter MD 01/03/2024 07:49 PM EDT RP Workstation: [id number]     Surgical Pathology result from 07/03/2022: FINAL MICROSCOPIC DIAGNOSIS:  A. MEDIASTINAL MASS, ANTERIOR, NEEDLE CORE BIOPSY: -Atypical lymphoid proliferation consistent with classical Hodgkin lymphoma -See comment  COMMENT:  The sections show needle core biopsy fragments displaying a nodular and diffuse lymphoid proliferation associated with dense sclerosis.  The lymphoid process shows predominance of small lymphoid cells admixed to a lesser extent with histiocytes, eosinophils in addition to the large atypical mononuclear and occasionally lobated lymphoid appearing cells with variably prominent nucleoli.  Flow cytometric analysis was performed [facility] [phone]) and shows predominance of CD4-positive T cells. No monoclonal B-cell population identified.  In addition, a battery of immunohistochemical stains was performed including CD30, CD15, mum 1, LCA, CD20, PAX5, CD3, CD5, CD4, CD8, TdT, cytokeratin AE1/AE3, cytokeratin 8/18 and EBV in situ hybridization with appropriate controls.  The large atypical lymphoid appearing cells are positive for CD15, CD30, PAX5, mum 1 and rare cells for CD20.  No significant staining is seen with LCA, EBV, CD3, CD5, CD4, CD8.  Cytokeratin stains highlight a small focus of positivity likely representing native epithelial elements.  No significant TdT positivity is identified.  The small lymphocytes in the background show a mixture of T and B cells with predominance of T cells.  The latter show predominance of CD4 positive cells.   Overall, the features are atypical and most consistent with classical Hodgkin lymphoma which is best subclassified as nodular sclerosis type.   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 The mass was causing some compression of his SVC and left brachiocephalic vein but no complete obstruction or symptoms related to this. No constitutional symptoms.  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  PLAN:  -Discussed lab results on 01/19/2024 in detail with patient. CBC normal, showed WBC of 4.9K, hemoglobin of 13.1, and platelets of 233K. -there are no elevated blood counts to sugggest an ongoing infection at this time -CMP normal -CT angio chest scan 01/03/2024 shows findings of multifocal pneumonia -discussed findings of some lymph nodes in the center of the chest which are slightly larger, most likely from his recent infection. There were no findings of blood clots.  -patient has tolerated cycle 11 of Nivolumab  treatment well with no new or major toxicities -continue his current treatment with monthly Nivolumab  immunotherapy  -discussed that he shall continue immunotherapy  for 2 years since starting it if he continues to remain in remission -Continue B-complex supplement.  -answered all of patient's and his mother's questions in detail  FOLLOW-UP: Per integrated scheduling  The total time spent in the appointment was 30 minutes* .  All of the patient's questions were answered with apparent satisfaction. The patient knows to call the clinic with any problems, questions or concerns.   Jacquelyn Matt MD MS AAHIVMS [facility] Hematology/Oncology Physician [facility]  .*Total Encounter Time  as defined by the Centers for Medicare and Medicaid Services includes, in addition to the face-to-face time of a patient visit (documented in the note above) non-face-to-face time: obtaining and reviewing outside history, ordering and reviewing medications, tests or procedures, care coordination (communications with other health care professionals or caregivers) and documentation in the medical record.    I,Mitra Faeizi,acting as a Neurosurgeon for Jacquelyn Matt, MD.,have documented all relevant documentation on the behalf of Jacquelyn Matt, MD,as directed by  Jacquelyn Matt, MD while in the presence of Jacquelyn Matt, MD.  .I have reviewed the above documentation for accuracy and completeness, and I agree with the above. .Latish Toutant Kishore Seleena Reimers MD

## 2024-01-19 ENCOUNTER — Inpatient Hospital Stay: Payer: Self-pay

## 2024-01-19 ENCOUNTER — Inpatient Hospital Stay (HOSPITAL_BASED_OUTPATIENT_CLINIC_OR_DEPARTMENT_OTHER): Payer: Self-pay | Admitting: Hematology

## 2024-01-19 ENCOUNTER — Inpatient Hospital Stay: Payer: Self-pay | Attending: Hematology

## 2024-01-19 ENCOUNTER — Encounter: Payer: Self-pay | Admitting: Hematology

## 2024-01-19 VITALS — BP 101/73 | HR 77 | Temp 98.0°F | Resp 18 | Ht 69.0 in | Wt 254.3 lb

## 2024-01-19 DIAGNOSIS — Z79620 Long term (current) use of immunosuppressive biologic: Secondary | ICD-10-CM | POA: Insufficient documentation

## 2024-01-19 DIAGNOSIS — C8192 Hodgkin lymphoma, unspecified, intrathoracic lymph nodes: Secondary | ICD-10-CM

## 2024-01-19 DIAGNOSIS — C8179 Other classical Hodgkin lymphoma, extranodal and solid organ sites: Secondary | ICD-10-CM | POA: Insufficient documentation

## 2024-01-19 DIAGNOSIS — Z5112 Encounter for antineoplastic immunotherapy: Secondary | ICD-10-CM | POA: Insufficient documentation

## 2024-01-19 DIAGNOSIS — Z5111 Encounter for antineoplastic chemotherapy: Secondary | ICD-10-CM | POA: Diagnosis not present

## 2024-01-19 DIAGNOSIS — Z95828 Presence of other vascular implants and grafts: Secondary | ICD-10-CM

## 2024-01-19 DIAGNOSIS — C8102 Nodular lymphocyte predominant Hodgkin lymphoma, intrathoracic lymph nodes: Secondary | ICD-10-CM

## 2024-01-19 LAB — CBC WITH DIFFERENTIAL (CANCER CENTER ONLY)
Abs Immature Granulocytes: 0.01 10*3/uL (ref 0.00–0.07)
Basophils Absolute: 0 10*3/uL (ref 0.0–0.1)
Basophils Relative: 1 %
Eosinophils Absolute: 0.2 10*3/uL (ref 0.0–0.5)
Eosinophils Relative: 4 %
HCT: 37.9 % — ABNORMAL LOW (ref 39.0–52.0)
Hemoglobin: 13.1 g/dL (ref 13.0–17.0)
Immature Granulocytes: 0 %
Lymphocytes Relative: 49 %
Lymphs Abs: 2.4 10*3/uL (ref 0.7–4.0)
MCH: 30.1 pg (ref 26.0–34.0)
MCHC: 34.6 g/dL (ref 30.0–36.0)
MCV: 87.1 fL (ref 80.0–100.0)
Monocytes Absolute: 0.6 10*3/uL (ref 0.1–1.0)
Monocytes Relative: 11 %
Neutro Abs: 1.7 10*3/uL (ref 1.7–7.7)
Neutrophils Relative %: 35 %
Platelet Count: 233 10*3/uL (ref 150–400)
RBC: 4.35 MIL/uL (ref 4.22–5.81)
RDW: 12.2 % (ref 11.5–15.5)
WBC Count: 4.9 10*3/uL (ref 4.0–10.5)
nRBC: 0 % (ref 0.0–0.2)

## 2024-01-19 LAB — CMP (CANCER CENTER ONLY)
ALT: 34 U/L (ref 0–44)
AST: 22 U/L (ref 15–41)
Albumin: 4.2 g/dL (ref 3.5–5.0)
Alkaline Phosphatase: 101 U/L (ref 38–126)
Anion gap: 4 — ABNORMAL LOW (ref 5–15)
BUN: 16 mg/dL (ref 6–20)
CO2: 29 mmol/L (ref 22–32)
Calcium: 9.2 mg/dL (ref 8.9–10.3)
Chloride: 105 mmol/L (ref 98–111)
Creatinine: 0.81 mg/dL (ref 0.61–1.24)
GFR, Estimated: >60 mL/min (ref >60)
Glucose, Bld: 117 mg/dL — ABNORMAL HIGH (ref 70–99)
Potassium: 3.7 mmol/L (ref 3.5–5.1)
Sodium: 138 mmol/L (ref 135–145)
Total Bilirubin: 0.4 mg/dL (ref 0.0–1.2)
Total Protein: 6.7 g/dL (ref 6.5–8.1)

## 2024-01-19 MED ORDER — SODIUM CHLORIDE 0.9% FLUSH
10.0000 mL | INTRAVENOUS | Status: DC | PRN
  Administered 2024-01-19: 10 mL

## 2024-01-19 MED ORDER — CETIRIZINE HCL 10 MG PO TABS
10.0000 mg | ORAL_TABLET | Freq: Every day | ORAL | Status: DC
  Administered 2024-01-19: 10 mg via ORAL
  Filled 2024-01-19: qty 1

## 2024-01-19 MED ORDER — SODIUM CHLORIDE 0.9% FLUSH
10.0000 mL | Freq: Once | INTRAVENOUS | Status: AC
  Administered 2024-01-19: 10 mL

## 2024-01-19 MED ORDER — ACETAMINOPHEN 325 MG PO TABS
650.0000 mg | ORAL_TABLET | Freq: Once | ORAL | Status: AC
  Administered 2024-01-19: 650 mg via ORAL
  Filled 2024-01-19: qty 2

## 2024-01-19 MED ORDER — HEPARIN SOD (PORK) LOCK FLUSH 100 UNIT/ML IV SOLN
500.0000 [IU] | Freq: Once | INTRAVENOUS | Status: AC | PRN
Start: 2024-01-19 — End: 2024-01-19
  Administered 2024-01-19: 500 [IU]

## 2024-01-19 MED ORDER — SODIUM CHLORIDE 0.9 % IV SOLN
480.0000 mg | Freq: Once | INTRAVENOUS | Status: AC
  Administered 2024-01-19: 480 mg via INTRAVENOUS
  Filled 2024-01-19: qty 48

## 2024-01-19 MED ORDER — SODIUM CHLORIDE 0.9 % IV SOLN: Freq: Once | INTRAVENOUS | Status: AC

## 2024-01-19 NOTE — Patient Instructions (Signed)
 [facility] - A DEPT OF [facility]  Discharge Instructions: Thank you for choosing [facility] to provide your oncology and hematology care.   If you have a lab appointment with the Cancer Center, please go directly to the Cancer Center and check in at the registration area.   Wear comfortable clothing and clothing appropriate for easy access to any Portacath or PICC line.   We strive to give you quality time with your provider. You may need to reschedule your appointment if you arrive late (15 or more minutes).  Arriving late affects you and other patients whose appointments are after yours.  Also, if you miss three or more appointments without notifying the office, you may be dismissed from the clinic at the provider's discretion.      For prescription refill requests, have your pharmacy contact our office and allow 72 hours for refills to be completed.    Today you received the following chemotherapy and/or immunotherapy agents: Opdivo      To help prevent nausea and vomiting after your treatment, we encourage you to take your nausea medication as directed.  BELOW ARE SYMPTOMS THAT SHOULD BE REPORTED IMMEDIATELY: *FEVER GREATER THAN 100.4 F (38 C) OR HIGHER *CHILLS OR SWEATING *NAUSEA AND VOMITING THAT IS NOT CONTROLLED WITH YOUR NAUSEA MEDICATION *UNUSUAL SHORTNESS OF BREATH *UNUSUAL BRUISING OR BLEEDING *URINARY PROBLEMS (pain or burning when urinating, or frequent urination) *BOWEL PROBLEMS (unusual diarrhea, constipation, pain near the anus) TENDERNESS IN MOUTH AND THROAT WITH OR WITHOUT PRESENCE OF ULCERS (sore throat, sores in mouth, or a toothache) UNUSUAL RASH, SWELLING OR PAIN  UNUSUAL VAGINAL DISCHARGE OR ITCHING   Items with * indicate a potential emergency and should be followed up as soon as possible or go to the Emergency Department if any problems should occur.  Please show the CHEMOTHERAPY ALERT CARD or IMMUNOTHERAPY  ALERT CARD at check-in to the Emergency Department and triage nurse.  Should you have questions after your visit or need to cancel or reschedule your appointment, please contact [facility] - A DEPT OF [facility]  Dept: [phone]  and follow the prompts.  Office hours are 8:00 a.m. to 4:30 p.m. Monday - Friday. Please note that voicemails left after 4:00 p.m. may not be returned until the following business day.  We are closed weekends and major holidays. You have access to a nurse at all times for urgent questions. Please call the main number to the clinic Dept: [phone] and follow the prompts.   For any non-urgent questions, you may also contact your provider using MyChart. We now offer e-Visits for anyone 57 and older to request care online for non-urgent symptoms. For details visit [email or web address].   Also download the MyChart app! Go to the app store, search "MyChart", open the app, select , and log in with your MyChart username and password.

## 2024-01-20 ENCOUNTER — Encounter: Payer: Self-pay | Admitting: Hematology

## 2024-01-21 ENCOUNTER — Telehealth: Payer: Self-pay | Admitting: Neurology

## 2024-01-21 DIAGNOSIS — G4733 Obstructive sleep apnea (adult) (pediatric): Secondary | ICD-10-CM

## 2024-01-21 NOTE — Telephone Encounter (Signed)
 Pt mother called in regards to tracking Cpap machine. Mother is concern about the PT sleeps.She is not sure if Cpap machines is tracking Pt because Pt keeps kicking it off the bed and sleeping on stomach. Mother just would like to know is it working or can she try something else that Pt will be comfortable with .

## 2024-01-24 ENCOUNTER — Other Ambulatory Visit (HOSPITAL_COMMUNITY): Payer: Self-pay

## 2024-01-24 ENCOUNTER — Encounter (HOSPITAL_COMMUNITY): Payer: Self-pay

## 2024-01-24 ENCOUNTER — Other Ambulatory Visit: Payer: Self-pay

## 2024-01-24 MED ORDER — PROCHLORPERAZINE MALEATE 10 MG PO TABS
10.0000 mg | ORAL_TABLET | Freq: Four times a day (QID) | ORAL | 1 refills | Status: DC | PRN
  Filled 2024-01-24: qty 30, 8d supply, fill #0
  Filled 2024-01-24: qty 10, 3d supply, fill #0
  Filled 2024-01-24: qty 20, 5d supply, fill #0
  Filled 2024-01-24: qty 30, 8d supply, fill #0

## 2024-01-24 MED ORDER — ONDANSETRON HCL 4 MG PO TABS
4.0000 mg | ORAL_TABLET | Freq: Four times a day (QID) | ORAL | 0 refills | Status: DC | PRN
  Filled 2024-01-24 (×2): qty 20, 5d supply, fill #0

## 2024-01-24 MED ORDER — OXCARBAZEPINE 300 MG PO TABS
300.0000 mg | ORAL_TABLET | Freq: Two times a day (BID) | ORAL | 1 refills | Status: DC
Start: 2023-12-23 — End: 2024-05-16
  Filled 2024-01-24 (×2): qty 60, 30d supply, fill #0
  Filled 2024-04-17: qty 60, 30d supply, fill #1

## 2024-01-24 MED ORDER — OLOPATADINE HCL 0.1 % OP SOLN
1.0000 [drp] | Freq: Two times a day (BID) | OPHTHALMIC | 2 refills | Status: DC
  Filled 2024-01-24 – 2024-01-25 (×2): qty 5, 50d supply, fill #0

## 2024-01-24 MED ORDER — ALBUTEROL SULFATE (2.5 MG/3ML) 0.083% IN NEBU
3.0000 mL | INHALATION_SOLUTION | Freq: Four times a day (QID) | RESPIRATORY_TRACT | 5 refills | Status: DC
  Filled 2024-01-24 (×2): qty 90, 8d supply, fill #0

## 2024-01-24 MED ORDER — DULOXETINE HCL 20 MG PO CPEP
20.0000 mg | ORAL_CAPSULE | ORAL | 1 refills | Status: DC
  Filled 2024-01-24 (×2): qty 30, 30d supply, fill #0
  Filled 2024-03-02: qty 30, 30d supply, fill #1

## 2024-01-24 MED ORDER — GUAIFENESIN 100 MG/5ML PO LIQD
5.0000 mL | ORAL | 0 refills | Status: DC
  Filled 2024-01-24 – 2024-01-25 (×3): qty 120, 4d supply, fill #0

## 2024-01-24 NOTE — Telephone Encounter (Signed)
 Message sent to [facility]

## 2024-01-24 NOTE — Addendum Note (Signed)
 Addended by: Viktoria Gray on: 01/24/2024 12:14 PM   Modules accepted: Orders

## 2024-01-24 NOTE — Telephone Encounter (Signed)
 I called mother of pt.  I relayed that our most recent DL ([phone] that he has used it.  I did not realize that ended 12-30-2023. He has been using since (although for a week he was in the hospital) after 12-30-2023.  He has moved to a bed that his head can be elevated so he remains sleeping on his back.  (Sleeps in mothers room ). I dont know if location has made a difference (relating to being close to [employer]). She is bedbound and had gotten a new bed and he was given hers).  I told her from the information that was sent Dr. Omar Bibber said that settings good except for high leak.  She recommends mask refit.  I told her [facility] is the com,  gave her # and address that he will need to go to to have refit and take machine to see if everything checks out good.  She relayed understanding.  I would community message that new order.

## 2024-01-24 NOTE — Telephone Encounter (Signed)
 I recommend making an appointment for a mask refit, as leak from the mask is high. Otherwise, settings look fine.

## 2024-01-25 ENCOUNTER — Other Ambulatory Visit (HOSPITAL_COMMUNITY): Payer: Self-pay

## 2024-01-25 ENCOUNTER — Other Ambulatory Visit: Payer: Self-pay

## 2024-01-25 ENCOUNTER — Encounter: Payer: Self-pay | Admitting: Hematology

## 2024-01-25 NOTE — Telephone Encounter (Signed)
 New, Fabiene Holt, RN; Samie Crews; 1 other Received, thank you!     Previous Messages    ----- Message ----- From: Viktoria Gray, RN Sent: 01/24/2024   1:51 PM EDT To: Osie Bleacher; Shirline Dover; Leana Printers; * Subject: mask refitt, machine not connecting            New order in epic for pt.    Needs mask refit, please contact mother of pt for appt.  Sherree Doctor B. Zeitlin Male, 33 y.o., [birth year] MRN: [id number] Phone: [phone]

## 2024-02-15 NOTE — Progress Notes (Signed)
 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 male who has been referred to us  by .Paul [name], Paul P, DO and Dr. Artemio Bilberry MD for evaluation and management of newly diagnosed mediastinal mass concerning for likely lymphoma.  Patient has a history of fetal alcohol syndrome with developmental delay, narcolepsy, ADD, exercise-induced asthma and lives with his adoptive parents. He recently presented to the hospital on 07/01/2022 with 2-week history of worsening shortness of breath cough and wheezing.  He received his asthma treatment but was still noted to have significant cough.  In the emergency room he had a chest x-ray which showed an mediastinal mass. Subsequent CT chest with contrast on 07/01/2022 showed extensive mediastinal and hilar lymphadenopathy with conglomerate soft tissue mass/adenopathy within the anterior mediastinum measuring 11.2 x 7.9 x 13 cm. Compression of the left brachiocephalic and SVC within the mid though these vascular structures remain patent.  Patient did not have any clinical signs or symptoms of SVC compression syndrome or left upper extremity swelling.  Patient subsequently had a CT of the abdomen and pelvis which showed no acute intra-abdominal or intrapelvic abnormalities. He underwent a CT-guided core needle biopsy of the mediastinal mass by interventional radiology.  The official pathology results from his biopsy are not currently available at the time of this clinic visit. I did call and talk to the pathologist Paul [name] and she notes that this either looks like a lymphoma or thymoma and she is running additional tests to make a final  diagnosis.  Patient's father accompanied him for this visit and his mother who helps make most of the decisions in tandem with his father was present on the phone.  They do not note any unexplained fevers chills night sweats or significant weight loss.  His breathing has been relatively stable since hospital discharge but he is still having some cough.  I confirmed adequacy of tissue sampling with the pathologist and the patient was then started on prednisone  to try to help his cough by drinking his mediastinal tumor some and reducing bronchial inflammation.  Patient is unable to provide much information or review of systems on account of his developmental delay issues.  Interval History:   Paul [name] is a wonderful 33 y.o. male who is here for continued evaluation and management of classical hogkins lymphoma. He is here to receive cycle 13 day 1 of his treatment.  Patient was last seen by me on 01/19/2024 and reported pneumonia infection, which had resolved.   He reports that he has been doing well overall since his last clinical visit.  His mother, Paul [name], is also on call during today's visit and helps provide history.  His mother reports that patient has had some aggression issues.   He denies any headache, change in vision, swallowing issues, diarrhea, new skin rashes, new SOB, chest pain, abdominal pain, or infection issues.   He has tingling and numbness in his entire bilateral hands since starting treatment. His mother notes that patient has issues with fine motor skills such as opening jars, and grabbing certain objections. He reports no issues with his feet from a tingling/numbness standpoint.   His mother notes that patient has gained some weight recently. His mother  notes that patient has not particularly increased his p.o. intake and is relatively more physically active.   He reports no other issues since his last immunotherapy.  Patient complains of increased fatigue. His  mother notes that he may sleep for 12 hours at a time and she has difficulty waking patient up despite hollering.   He has an Upcoming MRI sometime in June and will discuss results with his neurologist. Patient has an upcoming appointment with his neurologist on July 8th.   MEDICAL HISTORY:  Past Medical History:  Diagnosis Date   ADD (attention deficit disorder)    ADHD (attention deficit hyperactivity disorder)    Asthma    excercise induced and pollen   Auditory processing disorder    Complication of anesthesia    mother states pt. is harder to put under anes. and hard to wake up due to fetal alcohol syndrome; can be combative, per mother; states he will do better if mother is in PACU when he is coming out of anes.   Development delay    states mental status of a 33 year old   Exercise-induced asthma    prn inhaler/neb.   Fetal alcohol syndrome    Narcolepsy    Non-restorable tooth 09/2015   teeth   Separation anxiety    Twin birth     SURGICAL HISTORY: Past Surgical History:  Procedure Laterality Date   IR IMAGING GUIDED PORT INSERTION  08/11/2022   MULTIPLE EXTRACTIONS WITH ALVEOLOPLASTY N/A 09/23/2015   Procedure: MULTIPLE EXTRACTION WITH ALVEOLOPLASTY;  Surgeon: Ascencion Lava, DDS;  Location: [facility];  Service: Oral Surgery;  Laterality: N/A;   PYLOROMYOTOMY     age [age], performed found to not have stenosis on surgical exam   PYLOROMYOTOMY     did not have pyloric stenosis; did surgery on the wrong twin   RADIOLOGY WITH ANESTHESIA N/A 01/16/2020   Procedure: MRI WITH ANESTHESIA  BRAIN WITH AND WITHOUT CONTRAST;  Surgeon: Radiologist, Medication, MD;  Location: [facility] OR;  Service: Radiology;  Laterality: N/A;    SOCIAL HISTORY: Social History   Socioeconomic History   Marital status: Single    Spouse name: Not on file   Number of children: Not on file   Years of education: Not on file   Highest education level: Not on file  Occupational History    Not on file  Tobacco Use   Smoking status: Never   Smokeless tobacco: Never  Vaping Use   Vaping status: Never Used  Substance and Sexual Activity   Alcohol use: No   Drug use: No   Sexual activity: Not on file  Other Topics Concern   Not on file  Social History Narrative   ** Merged History Encounter **       Social Drivers of Health   Financial Resource Strain: Low Risk  (04/09/2021)   Received from [facility] visits prior to 11/07/2022.   Overall Financial Resource Strain (CARDIA)    Difficulty of Paying Living Expenses: Not hard at all  Food Insecurity: No Food Insecurity (01/05/2024)   Hunger Vital Sign    Worried About Running Out of Food in the Last Year: Never true    Ran Out of Food in the Last Year: Never true  Transportation Needs: No Transportation Needs (01/05/2024)   PRAPARE - Administrator, Civil Service (Medical): No    Lack of Transportation (Non-Medical): No  Physical Activity: Insufficiently Active (04/09/2021)   [facility] visits prior to 11/07/2022.   Exercise Vital Sign    Days of Exercise per Week: 5 days    Minutes of Exercise per Session: 10 min  Stress: No Stress Concern Present (04/09/2021)   Received from [facility] visits prior to 11/07/2022.   [employer] of Occupational Health - Occupational Stress Questionnaire    Feeling of Stress : Only a little  Social Connections: Socially Isolated (04/09/2021)   Received from [facility] visits prior to 11/07/2022.   Social Advertising account executive    Frequency of Communication with Friends and Family: Never    Frequency of Social Gatherings with Friends and Family: Once a week    Attends Religious Services: Never    Database administrator or Organizations: Yes    Attends Engineer, structural: More than 4 times per year    Marital Status: Never married  Intimate Partner Violence:  Not At Risk (01/05/2024)   Humiliation, Afraid, Rape, and Kick questionnaire    Fear of Current or Ex-Partner: No    Emotionally Abused: No    Physically Abused: No    Sexually Abused: No    FAMILY HISTORY: Family History  Adopted: Yes  Problem Relation Age of Onset   Mental illness Mother    Diabetes Mother    Hypertension Mother    Schizophrenia Mother    Bipolar disorder Mother    Mental retardation Father    Sleep apnea Neg Hx     ALLERGIES:  is allergic to gabapentin , modafinil, and other.  MEDICATIONS:  Current Outpatient Medications  Medication Sig Dispense Refill   albuterol  (PROVENTIL  HFA;VENTOLIN  HFA) 108 (90 BASE) MCG/ACT inhaler Inhale 2 puffs into the lungs every 6 (six) hours as needed for wheezing or shortness of breath.     albuterol  (PROVENTIL ) (2.5 MG/3ML) 0.083% nebulizer solution Take 3 mLs by nebulization 4 (four) times daily. 90 mL 5   albuterol  (PROVENTIL ) (5 MG/ML) 0.5% nebulizer solution Take 2.5 mg by nebulization every 6 (six) hours as needed for wheezing or shortness of breath.     amoxicillin -clavulanate (AUGMENTIN ) 875-125 MG tablet Take 1 tablet by mouth every 12 (twelve) hours. 10 tablet 0   DULoxetine  (CYMBALTA ) 20 MG capsule Take 1 capsule (20 mg total) by mouth. 30 capsule 1   guaiFENesin  (ROBITUSSIN) 100 MG/5ML liquid Take 5 mLs by mouth every 4 (four) hours as needed for cough or to loosen phlegm. 120 mL 0   guaiFENesin  (ROBITUSSIN) 100 MG/5ML liquid Take 5 mLs by mouth every 4 (four) hours as needed for cough to loosen phlegm 120 mL 0   ibuprofen  (ADVIL ) 800 MG tablet Take 800 mg by mouth every 8 (eight) hours as needed for mild pain or headache.     LIDOCAINE  EX Apply 1 Application topically as needed (Before immunotherapy).     Multiple Vitamin (MULTIVITAMIN WITH MINERALS) TABS tablet Take 1 tablet by mouth daily with breakfast.     olopatadine  (PATANOL) 0.1 % ophthalmic solution Place 1 drop into both eyes 2 (two) times daily. 5 mL 2    ondansetron  (ZOFRAN ) 4 MG tablet Take 1 tablet (4 mg total) by mouth every 6 (six) hours as needed for nausea. 20 tablet 0   ondansetron  (ZOFRAN ) 4 MG tablet Take 1 tablet (4 mg total) by mouth every 6 (six) hours as needed for nausea 20 tablet 0   Oxcarbazepine  (TRILEPTAL ) 300 MG tablet Take 600 mg by mouth  daily.     Oxcarbazepine  (TRILEPTAL ) 300 MG tablet Take 1 tablet (300 mg total) by mouth 2 (two) times daily. 60 tablet 1   Oxcarbazepine  (TRILEPTAL ) 300 MG tablet Take 1 tablet (300 mg total) by mouth at bedtime. 30 tablet 1   prochlorperazine  (COMPAZINE ) 10 MG tablet Take 1 tablet (10 mg total) by mouth every 6 (six) hours as needed for nausea or vomiting. 30 tablet 1   prochlorperazine  (COMPAZINE ) 10 MG tablet Take 1 tablet (10 mg total) by mouth every 6 (six) hours as needed for nausea and/or vomiting 30 tablet 1   Vitamin D , Ergocalciferol , (DRISDOL ) 1.25 MG (50000 UNIT) CAPS capsule Take 50,000 Units by mouth every Monday.     No current facility-administered medications for this visit.    REVIEW OF SYSTEMS:    10 Point review of Systems was done is negative except as noted above.   PHYSICAL EXAMINATION  .BP 109/73   Pulse 81   Temp 97.7 F (36.5 C)   Resp 20   Wt 257 lb 4.8 oz (116.7 kg)   SpO2 98%   BMI 38.00 kg/m    GENERAL:alert, in no acute distress and comfortable SKIN: no acute rashes, no significant lesions EYES: conjunctiva are pink and non-injected, sclera anicteric OROPHARYNX: MMM, no exudates, no oropharyngeal erythema or ulceration NECK: supple, no JVD LYMPH:  no palpable lymphadenopathy in the cervical, axillary or inguinal regions LUNGS: clear to auscultation b/l with normal respiratory effort HEART: regular rate & rhythm ABDOMEN:  normoactive bowel sounds , non tender, not distended. Extremity: no pedal edema PSYCH: alert & oriented x 3 with fluent speech NEURO: no focal motor/sensory deficits   LABORATORY DATA:  I have reviewed the data as  listed  .Aaron Aas    Latest Ref Rng & Units 02/16/2024    2:05 PM 01/19/2024    2:01 PM 01/06/2024   10:15 AM  CBC  WBC 4.0 - 10.5 K/uL 4.0  4.9  3.6   Hemoglobin 13.0 - 17.0 g/dL 62.9  52.8  41.3   Hematocrit 39.0 - 52.0 % 38.0  37.9  40.3   Platelets 150 - 400 K/uL 182  233  181    .    Latest Ref Rng & Units 02/16/2024    2:05 PM 01/19/2024    2:01 PM 01/06/2024   10:15 AM  CMP  Glucose 70 - 99 mg/dL 96  244  010   BUN 6 - 20 mg/dL 15  16  11    Creatinine 0.61 - 1.24 mg/dL 2.72  5.36  6.44   Sodium 135 - 145 mmol/L 138  138  136   Potassium 3.5 - 5.1 mmol/L 3.7  3.7  3.5   Chloride 98 - 111 mmol/L 106  105  102   CO2 22 - 32 mmol/L 28  29  26    Calcium 8.9 - 10.3 mg/dL 9.2  9.2  8.6   Total Protein 6.5 - 8.1 g/dL 6.7  6.7  6.6   Total Bilirubin 0.0 - 1.2 mg/dL 0.4  0.4  0.5   Alkaline Phos 38 - 126 U/L 104  101  73   AST 15 - 41 U/L 24  22  33   ALT 0 - 44 U/L 28  34  42    . Lab Results  Component Value Date   LDH 171 07/03/2022     RADIOGRAPHIC STUDIES: I have personally reviewed the radiological images as listed and agreed with the findings in the  report. No results found.    Surgical Pathology result from 07/03/2022: FINAL MICROSCOPIC DIAGNOSIS:  A. MEDIASTINAL MASS, ANTERIOR, NEEDLE CORE BIOPSY: -Atypical lymphoid proliferation consistent with classical Hodgkin lymphoma -See comment  COMMENT:  The sections show needle core biopsy fragments displaying a nodular and diffuse lymphoid proliferation associated with dense sclerosis.  The lymphoid process shows predominance of small lymphoid cells admixed to a lesser extent with histiocytes, eosinophils in addition to the large atypical mononuclear and occasionally lobated lymphoid appearing cells with variably prominent nucleoli.  Flow cytometric analysis was performed [facility] [phone]) and shows predominance of CD4-positive T cells. No monoclonal B-cell population identified.  In addition, a battery  of immunohistochemical stains was performed including CD30, CD15, mum 1, LCA, CD20, PAX5, CD3, CD5, CD4, CD8, TdT, cytokeratin AE1/AE3, cytokeratin 8/18 and EBV in situ hybridization with appropriate controls.  The large atypical lymphoid appearing cells are positive for CD15, CD30, PAX5, mum 1 and rare cells for CD20.  No significant staining is seen with LCA, EBV, CD3, CD5, CD4, CD8.  Cytokeratin stains highlight a small focus of positivity likely representing native epithelial elements.  No significant TdT positivity is identified.  The small lymphocytes in the background show a mixture of T and B cells with predominance of T cells.  The latter show predominance of CD4 positive cells.  Overall, the features are atypical and most consistent with classical Hodgkin lymphoma which is best subclassified as nodular sclerosis type.   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 The mass was causing some compression of his SVC and left brachiocephalic vein but no complete obstruction or symptoms related to this. No constitutional symptoms.  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  PLAN:  -Discussed lab results on 02/16/2024 in detail with patient. CBC normal, showed WBC of 4.0K, hemoglobin of 13.3, and platelets of 182K. -CMP normal -his last PET scan on 12/02/2023 was normal with no visible active lymphoma -his cancer is in remission based on his last PET scan from 2 months ago -patient is 1 year out since starting immunotherapy -discussed that generally, if he is tolerating immunotherapy, we would recommend to continue it for 2 years for best chance of long-term remission. Then, if his imaging shows no  concerns after the 2-year mark, we will plan to stop his immunotherapy and continue to monitor -discussed that changes of autoimmune phenomenon causing aggression as a result of nivolumab  would be extremely rare. Also discussed that if there were to be neurologic feature, it would more likely affect the spinal cord and sometimes the pituitary. Discussed that I have never seen aggression as a primary presentation as a side effect related to Nivolumab .  -discussed that his aggression was most likely related to sleep deprivation, other sleep orders, or modafinil -I would not attribute his fatigue to immunotherapy -patient has tolerated cycle 12 of Nivolumab  treatment well with no major toxicities -patient is appropriate to proceed with cycle 13 day 1 of treatment today  -fatigue is likely from narcolepsy -discussed that there can be some indirect fatigue from thyroid  function, though this has not been a concern.  -TSH noted to be normal mid-April -discussed that there can be worsened weight gain with sleep apnea and narcolepsy -recommend  connecting with his PCP to evaluate him for carpul tunnel syndrome, which I believe is more likely than neuropathy. Also discussed that there is sometimes a role for evaluation by a hand specialist.  -Patient shall proceed with upcoming neurology appointment on July 8th and discuss his symptoms including fatigue and aggression. If there is no alternate explanation and his neurologist feels that immunotherapy is the explanation, we can try holding immunotherapy for 1-2 months.  -answered all of patient's and his mother's questions in detail  FOLLOW-UP: Per integrated scheduling  The total time spent in the appointment was 30 minutes* .  All of the patient's questions were answered with apparent satisfaction. The patient knows to call the clinic with any problems, questions or concerns.   Jacquelyn Matt MD MS AAHIVMS [facility] Hematology/Oncology Physician [facility]  .*Total Encounter Time as defined by the Centers for Medicare and Medicaid Services includes, in addition to the face-to-face time of a patient visit (documented in the note above) non-face-to-face time: obtaining and reviewing outside history, ordering and reviewing medications, tests or procedures, care coordination (communications with other health care professionals or caregivers) and documentation in the medical record.    I,Mitra Faeizi,acting as a Neurosurgeon for Jacquelyn Matt, MD.,have documented all relevant documentation on the behalf of Jacquelyn Matt, MD,as directed by  Jacquelyn Matt, MD while in the presence of Jacquelyn Matt, MD.  .I have reviewed the above documentation for accuracy and completeness, and I agree with the above. .Nomi Rudnicki Kishore Brealynn Contino MD

## 2024-02-16 ENCOUNTER — Inpatient Hospital Stay (HOSPITAL_BASED_OUTPATIENT_CLINIC_OR_DEPARTMENT_OTHER): Admitting: Hematology

## 2024-02-16 ENCOUNTER — Inpatient Hospital Stay

## 2024-02-16 ENCOUNTER — Inpatient Hospital Stay: Attending: Hematology

## 2024-02-16 VITALS — BP 109/73 | HR 81 | Temp 97.7°F | Resp 20 | Wt 257.3 lb

## 2024-02-16 DIAGNOSIS — Z5112 Encounter for antineoplastic immunotherapy: Secondary | ICD-10-CM | POA: Insufficient documentation

## 2024-02-16 DIAGNOSIS — Z5111 Encounter for antineoplastic chemotherapy: Secondary | ICD-10-CM

## 2024-02-16 DIAGNOSIS — Z95828 Presence of other vascular implants and grafts: Secondary | ICD-10-CM

## 2024-02-16 DIAGNOSIS — C8192 Hodgkin lymphoma, unspecified, intrathoracic lymph nodes: Secondary | ICD-10-CM

## 2024-02-16 DIAGNOSIS — C8179 Other classical Hodgkin lymphoma, extranodal and solid organ sites: Secondary | ICD-10-CM | POA: Insufficient documentation

## 2024-02-16 DIAGNOSIS — Z79620 Long term (current) use of immunosuppressive biologic: Secondary | ICD-10-CM | POA: Diagnosis not present

## 2024-02-16 DIAGNOSIS — C8102 Nodular lymphocyte predominant Hodgkin lymphoma, intrathoracic lymph nodes: Secondary | ICD-10-CM

## 2024-02-16 LAB — CMP (CANCER CENTER ONLY)
ALT: 28 U/L (ref 0–44)
AST: 24 U/L (ref 15–41)
Albumin: 4.3 g/dL (ref 3.5–5.0)
Alkaline Phosphatase: 104 U/L (ref 38–126)
Anion gap: 4 — ABNORMAL LOW (ref 5–15)
BUN: 15 mg/dL (ref 6–20)
CO2: 28 mmol/L (ref 22–32)
Calcium: 9.2 mg/dL (ref 8.9–10.3)
Chloride: 106 mmol/L (ref 98–111)
Creatinine: 0.77 mg/dL (ref 0.61–1.24)
GFR, Estimated: >60 mL/min (ref >60)
Glucose, Bld: 96 mg/dL (ref 70–99)
Potassium: 3.7 mmol/L (ref 3.5–5.1)
Sodium: 138 mmol/L (ref 135–145)
Total Bilirubin: 0.4 mg/dL (ref 0.0–1.2)
Total Protein: 6.7 g/dL (ref 6.5–8.1)

## 2024-02-16 LAB — CBC WITH DIFFERENTIAL (CANCER CENTER ONLY)
Abs Immature Granulocytes: 0.01 10*3/uL (ref 0.00–0.07)
Basophils Absolute: 0 10*3/uL (ref 0.0–0.1)
Basophils Relative: 1 %
Eosinophils Absolute: 0.3 10*3/uL (ref 0.0–0.5)
Eosinophils Relative: 7 %
HCT: 38 % — ABNORMAL LOW (ref 39.0–52.0)
Hemoglobin: 13.3 g/dL (ref 13.0–17.0)
Immature Granulocytes: 0 %
Lymphocytes Relative: 42 %
Lymphs Abs: 1.7 10*3/uL (ref 0.7–4.0)
MCH: 30.4 pg (ref 26.0–34.0)
MCHC: 35 g/dL (ref 30.0–36.0)
MCV: 87 fL (ref 80.0–100.0)
Monocytes Absolute: 0.5 10*3/uL (ref 0.1–1.0)
Monocytes Relative: 13 %
Neutro Abs: 1.5 10*3/uL — ABNORMAL LOW (ref 1.7–7.7)
Neutrophils Relative %: 37 %
Platelet Count: 182 10*3/uL (ref 150–400)
RBC: 4.37 MIL/uL (ref 4.22–5.81)
RDW: 12.5 % (ref 11.5–15.5)
WBC Count: 4 10*3/uL (ref 4.0–10.5)
nRBC: 0 % (ref 0.0–0.2)

## 2024-02-16 MED ORDER — SODIUM CHLORIDE 0.9 % IV SOLN: Freq: Once | INTRAVENOUS | Status: AC

## 2024-02-16 MED ORDER — SODIUM CHLORIDE 0.9 % IV SOLN
480.0000 mg | Freq: Once | INTRAVENOUS | Status: AC
  Administered 2024-02-16: 480 mg via INTRAVENOUS
  Filled 2024-02-16: qty 48

## 2024-02-16 MED ORDER — ACETAMINOPHEN 325 MG PO TABS
650.0000 mg | ORAL_TABLET | Freq: Once | ORAL | Status: AC
  Administered 2024-02-16: 650 mg via ORAL
  Filled 2024-02-16: qty 2

## 2024-02-16 MED ORDER — SODIUM CHLORIDE 0.9% FLUSH
10.0000 mL | Freq: Once | INTRAVENOUS | Status: AC
  Administered 2024-02-16: 10 mL

## 2024-02-16 MED ORDER — SODIUM CHLORIDE 0.9% FLUSH
10.0000 mL | INTRAVENOUS | Status: DC | PRN
  Administered 2024-02-16: 10 mL

## 2024-02-16 MED ORDER — HEPARIN SOD (PORK) LOCK FLUSH 100 UNIT/ML IV SOLN
500.0000 [IU] | Freq: Once | INTRAVENOUS | Status: AC | PRN
  Administered 2024-02-16: 500 [IU]

## 2024-02-16 MED ORDER — CETIRIZINE HCL 10 MG PO TABS
10.0000 mg | ORAL_TABLET | Freq: Every day | ORAL | Status: DC
  Administered 2024-02-16: 10 mg via ORAL
  Filled 2024-02-16: qty 1

## 2024-02-16 NOTE — Patient Instructions (Signed)
 [facility] - A DEPT OF [facility]  Discharge Instructions: Thank you for choosing [facility] to provide your oncology and hematology care.   If you have a lab appointment with the Cancer Center, please go directly to the Cancer Center and check in at the registration area.   Wear comfortable clothing and clothing appropriate for easy access to any Portacath or PICC line.   We strive to give you quality time with your provider. You may need to reschedule your appointment if you arrive late (15 or more minutes).  Arriving late affects you and other patients whose appointments are after yours.  Also, if you miss three or more appointments without notifying the office, you may be dismissed from the clinic at the provider's discretion.      For prescription refill requests, have your pharmacy contact our office and allow 72 hours for refills to be completed.    Today you received the following chemotherapy and/or immunotherapy agents: Opdivo      To help prevent nausea and vomiting after your treatment, we encourage you to take your nausea medication as directed.  BELOW ARE SYMPTOMS THAT SHOULD BE REPORTED IMMEDIATELY: *FEVER GREATER THAN 100.4 F (38 C) OR HIGHER *CHILLS OR SWEATING *NAUSEA AND VOMITING THAT IS NOT CONTROLLED WITH YOUR NAUSEA MEDICATION *UNUSUAL SHORTNESS OF BREATH *UNUSUAL BRUISING OR BLEEDING *URINARY PROBLEMS (pain or burning when urinating, or frequent urination) *BOWEL PROBLEMS (unusual diarrhea, constipation, pain near the anus) TENDERNESS IN MOUTH AND THROAT WITH OR WITHOUT PRESENCE OF ULCERS (sore throat, sores in mouth, or a toothache) UNUSUAL RASH, SWELLING OR PAIN  UNUSUAL VAGINAL DISCHARGE OR ITCHING   Items with * indicate a potential emergency and should be followed up as soon as possible or go to the Emergency Department if any problems should occur.  Please show the CHEMOTHERAPY ALERT CARD or IMMUNOTHERAPY  ALERT CARD at check-in to the Emergency Department and triage nurse.  Should you have questions after your visit or need to cancel or reschedule your appointment, please contact [facility] - A DEPT OF [facility]  Dept: [phone]  and follow the prompts.  Office hours are 8:00 a.m. to 4:30 p.m. Monday - Friday. Please note that voicemails left after 4:00 p.m. may not be returned until the following business day.  We are closed weekends and major holidays. You have access to a nurse at all times for urgent questions. Please call the main number to the clinic Dept: [phone] and follow the prompts.   For any non-urgent questions, you may also contact your provider using MyChart. We now offer e-Visits for anyone 57 and older to request care online for non-urgent symptoms. For details visit [email or web address].   Also download the MyChart app! Go to the app store, search "MyChart", open the app, select , and log in with your MyChart username and password.

## 2024-02-17 ENCOUNTER — Encounter: Payer: Self-pay | Admitting: Hematology

## 2024-02-17 ENCOUNTER — Other Ambulatory Visit (HOSPITAL_COMMUNITY): Payer: Self-pay

## 2024-02-17 MED ORDER — OXCARBAZEPINE 300 MG PO TABS
300.0000 mg | ORAL_TABLET | Freq: Every day | ORAL | 1 refills | Status: DC
  Filled 2024-02-17 – 2024-03-02 (×3): qty 30, 30d supply, fill #0

## 2024-02-18 ENCOUNTER — Encounter: Payer: Self-pay | Admitting: Hematology

## 2024-02-18 NOTE — Progress Notes (Signed)
 Contacted pt's Mother regarding her MyChart message. Assured pt's Mother that pt's anion results are barely out of range and all other values associated with the CMP are WNL . Labs were reviewed by [facility] with no concerns. Pt's mother verbalized understanding and acknowledged information.

## 2024-02-19 ENCOUNTER — Encounter: Payer: Self-pay | Admitting: Hematology

## 2024-02-21 ENCOUNTER — Other Ambulatory Visit: Payer: Self-pay

## 2024-02-24 ENCOUNTER — Encounter: Payer: Self-pay | Admitting: Hematology

## 2024-02-25 ENCOUNTER — Telehealth: Payer: Self-pay | Admitting: Neurology

## 2024-02-25 NOTE — Telephone Encounter (Signed)
 Pt mother called in regard  to request earlier appt  the patient mother is concern about Pt  and cpap machine. Mother Know pt appoint is far out for yearly update but Mother explain Pt is going thru a lot and would love for Pt  be seen sooner

## 2024-02-28 NOTE — Telephone Encounter (Signed)
 I called and spoke to mother of pt.  Pt has issues with machine, stating not working as it should.  Pt has issues with wearing mask, hose comes off.  He sleeps prone, and she has tried to make hob elevated to keep him up but he gets up to help his mother at night, sounds like dog sleep with him.  I told her that from what I see that he is using the machine but it is not 4 hr nightly.  She  has noted aggressive behavior , memory , has narcolepsy, pcp and psych say its might be cpap/osa.  Mom is trying to rule it out.  Pt has appt with [facility] 03-08-2024 to evaluate and see if machine is working properly and if is would like to come and see provider.  Moved up appt for him to 04-10-2024 at 0930 about cpap.  He would be tired / sleepy if not using the machine. She will call and cancel the appt. If machine is the issue.  (Or trying new mask/ head gear).

## 2024-03-02 ENCOUNTER — Other Ambulatory Visit (HOSPITAL_COMMUNITY): Payer: Self-pay

## 2024-03-03 ENCOUNTER — Other Ambulatory Visit: Payer: Self-pay

## 2024-03-03 ENCOUNTER — Other Ambulatory Visit (HOSPITAL_COMMUNITY): Payer: Self-pay

## 2024-03-03 MED ORDER — OXCARBAZEPINE 300 MG PO TABS
300.0000 mg | ORAL_TABLET | Freq: Every evening | ORAL | 1 refills | Status: DC
  Filled 2024-03-03: qty 30, 30d supply, fill #0

## 2024-03-06 ENCOUNTER — Other Ambulatory Visit (HOSPITAL_COMMUNITY): Payer: Self-pay

## 2024-03-09 ENCOUNTER — Other Ambulatory Visit (HOSPITAL_COMMUNITY): Payer: Self-pay

## 2024-03-13 ENCOUNTER — Encounter: Payer: Self-pay | Admitting: Neurology

## 2024-03-14 ENCOUNTER — Encounter: Payer: Self-pay | Admitting: Hematology

## 2024-03-15 ENCOUNTER — Encounter: Payer: Self-pay | Admitting: Pharmacist

## 2024-03-15 ENCOUNTER — Inpatient Hospital Stay (HOSPITAL_BASED_OUTPATIENT_CLINIC_OR_DEPARTMENT_OTHER): Admitting: Physician Assistant

## 2024-03-15 ENCOUNTER — Other Ambulatory Visit: Payer: Self-pay

## 2024-03-15 ENCOUNTER — Encounter: Payer: Self-pay | Admitting: Hematology

## 2024-03-15 ENCOUNTER — Inpatient Hospital Stay

## 2024-03-15 ENCOUNTER — Inpatient Hospital Stay: Attending: Hematology

## 2024-03-15 ENCOUNTER — Other Ambulatory Visit (HOSPITAL_COMMUNITY): Payer: Self-pay

## 2024-03-15 VITALS — BP 104/64 | HR 76 | Temp 98.6°F | Resp 13 | Wt 260.6 lb

## 2024-03-15 DIAGNOSIS — C8192 Hodgkin lymphoma, unspecified, intrathoracic lymph nodes: Secondary | ICD-10-CM

## 2024-03-15 DIAGNOSIS — Z5112 Encounter for antineoplastic immunotherapy: Secondary | ICD-10-CM | POA: Insufficient documentation

## 2024-03-15 DIAGNOSIS — Z79620 Long term (current) use of immunosuppressive biologic: Secondary | ICD-10-CM | POA: Insufficient documentation

## 2024-03-15 DIAGNOSIS — C8179 Other classical Hodgkin lymphoma, extranodal and solid organ sites: Secondary | ICD-10-CM | POA: Diagnosis present

## 2024-03-15 DIAGNOSIS — Z95828 Presence of other vascular implants and grafts: Secondary | ICD-10-CM

## 2024-03-15 DIAGNOSIS — C8102 Nodular lymphocyte predominant Hodgkin lymphoma, intrathoracic lymph nodes: Secondary | ICD-10-CM | POA: Diagnosis not present

## 2024-03-15 LAB — CBC WITH DIFFERENTIAL (CANCER CENTER ONLY)
Abs Immature Granulocytes: 0 K/uL (ref 0.00–0.07)
Basophils Absolute: 0 K/uL (ref 0.0–0.1)
Basophils Relative: 1 %
Eosinophils Absolute: 0.3 K/uL (ref 0.0–0.5)
Eosinophils Relative: 7 %
HCT: 38.2 % — ABNORMAL LOW (ref 39.0–52.0)
Hemoglobin: 13.4 g/dL (ref 13.0–17.0)
Immature Granulocytes: 0 %
Lymphocytes Relative: 45 %
Lymphs Abs: 2.2 K/uL (ref 0.7–4.0)
MCH: 30.3 pg (ref 26.0–34.0)
MCHC: 35.1 g/dL (ref 30.0–36.0)
MCV: 86.4 fL (ref 80.0–100.0)
Monocytes Absolute: 0.5 K/uL (ref 0.1–1.0)
Monocytes Relative: 9 %
Neutro Abs: 1.9 K/uL (ref 1.7–7.7)
Neutrophils Relative %: 38 %
Platelet Count: 215 K/uL (ref 150–400)
RBC: 4.42 MIL/uL (ref 4.22–5.81)
RDW: 12.5 % (ref 11.5–15.5)
WBC Count: 4.9 K/uL (ref 4.0–10.5)
nRBC: 0 % (ref 0.0–0.2)

## 2024-03-15 LAB — CMP (CANCER CENTER ONLY)
ALT: 22 U/L (ref 0–44)
AST: 23 U/L (ref 15–41)
Albumin: 4.2 g/dL (ref 3.5–5.0)
Alkaline Phosphatase: 109 U/L (ref 38–126)
Anion gap: 5 (ref 5–15)
BUN: 13 mg/dL (ref 6–20)
CO2: 29 mmol/L (ref 22–32)
Calcium: 9.2 mg/dL (ref 8.9–10.3)
Chloride: 105 mmol/L (ref 98–111)
Creatinine: 0.86 mg/dL (ref 0.61–1.24)
GFR, Estimated: >60 mL/min (ref >60)
Glucose, Bld: 97 mg/dL (ref 70–99)
Potassium: 3.9 mmol/L (ref 3.5–5.1)
Sodium: 139 mmol/L (ref 135–145)
Total Bilirubin: 0.3 mg/dL (ref 0.0–1.2)
Total Protein: 6.6 g/dL (ref 6.5–8.1)

## 2024-03-15 MED ORDER — CETIRIZINE HCL 10 MG PO TABS
10.0000 mg | ORAL_TABLET | Freq: Every day | ORAL | Status: DC
  Administered 2024-03-15: 10 mg via ORAL
  Filled 2024-03-15: qty 1

## 2024-03-15 MED ORDER — SODIUM CHLORIDE 0.9 % IV SOLN
480.0000 mg | Freq: Once | INTRAVENOUS | Status: AC
  Administered 2024-03-15: 480 mg via INTRAVENOUS
  Filled 2024-03-15: qty 48

## 2024-03-15 MED ORDER — SODIUM CHLORIDE 0.9% FLUSH
10.0000 mL | Freq: Once | INTRAVENOUS | Status: AC
  Administered 2024-03-15: 10 mL

## 2024-03-15 MED ORDER — VITAMIN D (ERGOCALCIFEROL) 1.25 MG (50000 UNIT) PO CAPS
50000.0000 [IU] | ORAL_CAPSULE | ORAL | 0 refills | Status: DC
  Filled 2024-03-15: qty 4, 28d supply, fill #0
  Filled 2024-03-22: qty 12, 84d supply, fill #0

## 2024-03-15 MED ORDER — SODIUM CHLORIDE 0.9 % IV SOLN: Freq: Once | INTRAVENOUS | Status: AC

## 2024-03-15 MED ORDER — SODIUM CHLORIDE 0.9% FLUSH
10.0000 mL | INTRAVENOUS | Status: DC | PRN
  Administered 2024-03-15: 10 mL

## 2024-03-15 MED ORDER — HEPARIN SOD (PORK) LOCK FLUSH 100 UNIT/ML IV SOLN
500.0000 [IU] | Freq: Once | INTRAVENOUS | Status: AC | PRN
  Administered 2024-03-15: 500 [IU]

## 2024-03-15 MED ORDER — ACETAMINOPHEN 325 MG PO TABS
650.0000 mg | ORAL_TABLET | Freq: Once | ORAL | Status: AC
  Administered 2024-03-15: 650 mg via ORAL
  Filled 2024-03-15: qty 2

## 2024-03-15 NOTE — Progress Notes (Signed)
 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 Held Bleomycine starting Cycle 4. Completed 6 cycles on 02/03/2023.   CURRENT TREATMENT: --Second line therapy with Adcentris plus Nivolumab  started on 03/24/2023. Held adcentris starting with Cycle 2 due to neuropathy.   Interval History:  Paul [name] is a 33 year old male who is here for continued evaluation and management of classical hogkins lymphoma. He was last seen by Dr. Onesimo on 02/16/2024. He presents today prior to Cycle 14, Day 1.   Mr. Froman reports that he has ongoing fatigue but can complete his baseline ADLs. He continues to gain weight and is more sedentary. He denies nausea, vomiting or bowel habit changes. He denies easy bruising or signs of active bleeding. He has noticed decreased strength in hands that impacts his grip. He is undergoing workup for this including upcoming MRI brain. He denies fevers, chills, sweats, shortness of breath, chest pain or cough. He has no other complaints.     MEDICAL HISTORY:  Past Medical History:  Diagnosis Date   ADD (attention deficit disorder)    ADHD (attention deficit hyperactivity disorder)    Asthma    excercise induced and pollen   Auditory processing disorder    Complication of anesthesia    mother states pt. is harder to put under anes. and hard to wake up due to fetal alcohol syndrome; can be combative, per mother; states he will do better if mother is in PACU when he is coming out of anes.   Development delay    states mental status of a 33 year old   Exercise-induced asthma    prn inhaler/neb.   Fetal alcohol syndrome    Narcolepsy    Non-restorable tooth 09/2015    teeth   Separation anxiety    Twin birth     SURGICAL HISTORY: Past Surgical History:  Procedure Laterality Date   IR IMAGING GUIDED PORT INSERTION  08/11/2022   MULTIPLE EXTRACTIONS WITH ALVEOLOPLASTY N/A 09/23/2015   Procedure: MULTIPLE EXTRACTION WITH ALVEOLOPLASTY;  Surgeon: Glendia Primrose, DDS;  Location: [facility];  Service: Oral Surgery;  Laterality: N/A;   PYLOROMYOTOMY     age [age], performed found to not have stenosis on surgical exam   PYLOROMYOTOMY     did not have pyloric stenosis; did surgery on the wrong twin   RADIOLOGY WITH ANESTHESIA N/A 01/16/2020   Procedure: MRI WITH ANESTHESIA  BRAIN WITH AND WITHOUT CONTRAST;  Surgeon: Radiologist, Medication, MD;  Location: [facility] OR;  Service: Radiology;  Laterality: N/A;    SOCIAL HISTORY: Social History   Socioeconomic History   Marital status: Single    Spouse name: Not on file   Number of children: Not on file   Years of education: Not on file   Highest education level: Not on file  Occupational History   Not on file  Tobacco Use   Smoking status: Never   Smokeless tobacco: Never  Vaping Use   Vaping status: Never Used  Substance and Sexual Activity   Alcohol use: No   Drug use: No   Sexual activity: Not on file  Other Topics Concern   Not on file  Social History Narrative   ** Merged History Encounter **  Social Drivers of Health   Financial Resource Strain: Low Risk  (04/09/2021)   Received from [facility] visits prior to 11/07/2022.   Overall Financial Resource Strain (CARDIA)    Difficulty of Paying Living Expenses: Not hard at all  Food Insecurity: No Food Insecurity (01/05/2024)   Hunger Vital Sign    Worried About Running Out of Food in the Last Year: Never true    Ran Out of Food in the Last Year: Never true  Transportation Needs: No Transportation Needs (01/05/2024)   PRAPARE - Administrator, Civil Service (Medical): No    Lack of Transportation  (Non-Medical): No  Physical Activity: Insufficiently Active (04/09/2021)   Received from [facility] visits prior to 11/07/2022.   Exercise Vital Sign    Days of Exercise per Week: 5 days    Minutes of Exercise per Session: 10 min  Stress: No Stress Concern Present (04/09/2021)   Received from [facility] visits prior to 11/07/2022.   [employer] of Occupational Health - Occupational Stress Questionnaire    Feeling of Stress : Only a little  Social Connections: Socially Isolated (04/09/2021)   Received from [facility] visits prior to 11/07/2022.   Social Advertising account executive    Frequency of Communication with Friends and Family: Never    Frequency of Social Gatherings with Friends and Family: Once a week    Attends Religious Services: Never    Database administrator or Organizations: Yes    Attends Engineer, structural: More than 4 times per year    Marital Status: Never married  Intimate Partner Violence: Not At Risk (01/05/2024)   Humiliation, Afraid, Rape, and Kick questionnaire    Fear of Current or Ex-Partner: No    Emotionally Abused: No    Physically Abused: No    Sexually Abused: No    FAMILY HISTORY: Family History  Adopted: Yes  Problem Relation Age of Onset   Mental illness Mother    Diabetes Mother    Hypertension Mother    Schizophrenia Mother    Bipolar disorder Mother    Mental retardation Father    Sleep apnea Neg Hx     ALLERGIES:  is allergic to gabapentin , modafinil, and other.  MEDICATIONS:  Current Outpatient Medications  Medication Sig Dispense Refill   albuterol  (PROVENTIL  HFA;VENTOLIN  HFA) 108 (90 BASE) MCG/ACT inhaler Inhale 2 puffs into the lungs every 6 (six) hours as needed for wheezing or shortness of breath.     albuterol  (PROVENTIL ) (2.5 MG/3ML) 0.083% nebulizer solution Take 3 mLs by nebulization 4 (four) times daily. 90 mL 5   albuterol  (PROVENTIL ) (5 MG/ML)  0.5% nebulizer solution Take 2.5 mg by nebulization every 6 (six) hours as needed for wheezing or shortness of breath.     amoxicillin -clavulanate (AUGMENTIN ) 875-125 MG tablet Take 1 tablet by mouth every 12 (twelve) hours. 10 tablet 0   DULoxetine  (CYMBALTA ) 20 MG capsule Take 1 capsule (20 mg total) by mouth. 30 capsule 1   guaiFENesin  (ROBITUSSIN) 100 MG/5ML liquid Take 5 mLs by mouth every 4 (four) hours as needed for cough or to loosen phlegm. 120 mL 0   guaiFENesin  (ROBITUSSIN) 100 MG/5ML liquid Take 5 mLs by mouth every 4 (four) hours as needed for cough to loosen phlegm 120 mL 0   ibuprofen  (ADVIL ) 800 MG tablet Take 800 mg by mouth every 8 (eight) hours as needed for  mild pain or headache.     LIDOCAINE  EX Apply 1 Application topically as needed (Before immunotherapy).     Multiple Vitamin (MULTIVITAMIN WITH MINERALS) TABS tablet Take 1 tablet by mouth daily with breakfast.     olopatadine  (PATANOL) 0.1 % ophthalmic solution Place 1 drop into both eyes 2 (two) times daily. 5 mL 2   ondansetron  (ZOFRAN ) 4 MG tablet Take 1 tablet (4 mg total) by mouth every 6 (six) hours as needed for nausea. 20 tablet 0   ondansetron  (ZOFRAN ) 4 MG tablet Take 1 tablet (4 mg total) by mouth every 6 (six) hours as needed for nausea 20 tablet 0   Oxcarbazepine  (TRILEPTAL ) 300 MG tablet Take 600 mg by mouth daily.     Oxcarbazepine  (TRILEPTAL ) 300 MG tablet Take 1 tablet (300 mg total) by mouth 2 (two) times daily. 60 tablet 1   Oxcarbazepine  (TRILEPTAL ) 300 MG tablet Take 1 tablet (300 mg total) by mouth at bedtime. 30 tablet 1   Oxcarbazepine  (TRILEPTAL ) 300 MG tablet Take 1 tablet (300 mg total) by mouth at bedtime. 30 tablet 1   prochlorperazine  (COMPAZINE ) 10 MG tablet Take 1 tablet (10 mg total) by mouth every 6 (six) hours as needed for nausea or vomiting. 30 tablet 1   prochlorperazine  (COMPAZINE ) 10 MG tablet Take 1 tablet (10 mg total) by mouth every 6 (six) hours as needed for nausea and/or vomiting  30 tablet 1   Vitamin D , Ergocalciferol , (DRISDOL ) 1.25 MG (50000 UNIT) CAPS capsule Take 50,000 Units by mouth every Monday.     Vitamin D , Ergocalciferol , (DRISDOL ) 1.25 MG (50000 UNIT) CAPS capsule Take 1 capsule (50,000 Units total) by mouth once a week. 12 capsule 0   No current facility-administered medications for this visit.    REVIEW OF SYSTEMS:    10 Point review of Systems was done is negative except as noted above.   PHYSICAL EXAMINATION  There were no vitals taken for this visit. GENERAL:alert, in no acute distress and comfortable SKIN: no acute rashes, no significant lesions EYES: conjunctiva are pink and non-injected, sclera anicteric LUNGS: clear to auscultation b/l with normal respiratory effort HEART: regular rate & rhythm Extremity: no pedal edema.  PSYCH: alert & oriented x 3 with fluent speech NEURO: no focal motor/sensory deficits   LABORATORY DATA:  I have reviewed the data as listed  .    Latest Ref Rng & Units 03/15/2024   12:46 PM 02/16/2024    2:05 PM 01/19/2024    2:01 PM  CBC  WBC 4.0 - 10.5 K/uL 4.9  4.0  4.9   Hemoglobin 13.0 - 17.0 g/dL 86.5  86.6  86.8   Hematocrit 39.0 - 52.0 % 38.2  38.0  37.9   Platelets 150 - 400 K/uL 215  182  233    ANC 300  .    Latest Ref Rng & Units 02/16/2024    2:05 PM 01/19/2024    2:01 PM 01/06/2024   10:15 AM  CMP  Glucose 70 - 99 mg/dL 96  882  888   BUN 6 - 20 mg/dL 15  16  11    Creatinine 0.61 - 1.24 mg/dL 9.22  9.18  9.13   Sodium 135 - 145 mmol/L 138  138  136   Potassium 3.5 - 5.1 mmol/L 3.7  3.7  3.5   Chloride 98 - 111 mmol/L 106  105  102   CO2 22 - 32 mmol/L 28  29  26    Calcium  8.9 - 10.3 mg/dL 9.2  9.2  8.6   Total Protein 6.5 - 8.1 g/dL 6.7  6.7  6.6   Total Bilirubin 0.0 - 1.2 mg/dL 0.4  0.4  0.5   Alkaline Phos 38 - 126 U/L 104  101  73   AST 15 - 41 U/L 24  22  33   ALT 0 - 44 U/L 28  34  42    . Lab Results  Component Value Date   LDH 171 07/03/2022   RADIOGRAPHIC STUDIES: I  have personally reviewed the radiological images as listed and agreed with the findings in the report. No results found.   Surgical Pathology result from 07/03/2022: FINAL MICROSCOPIC DIAGNOSIS:  A. MEDIASTINAL MASS, ANTERIOR, NEEDLE CORE BIOPSY: -Atypical lymphoid proliferation consistent with classical Hodgkin lymphoma -See comment  COMMENT:  The sections show needle core biopsy fragments displaying a nodular and diffuse lymphoid proliferation associated with dense sclerosis.  The lymphoid process shows predominance of small lymphoid cells admixed to a lesser extent with histiocytes, eosinophils in addition to the large atypical mononuclear and occasionally lobated lymphoid appearing cells with variably prominent nucleoli.  Flow cytometric analysis was performed [facility] [phone]) and shows predominance of CD4-positive T cells. No monoclonal B-cell population identified.  In addition, a battery of immunohistochemical stains was performed including CD30, CD15, mum 1, LCA, CD20, PAX5, CD3, CD5, CD4, CD8, TdT, cytokeratin AE1/AE3, cytokeratin 8/18 and EBV in situ hybridization with appropriate controls.  The large atypical lymphoid appearing cells are positive for CD15, CD30, PAX5, mum 1 and rare cells for CD20.  No significant staining is seen with LCA, EBV, CD3, CD5, CD4, CD8.  Cytokeratin stains highlight a small focus of positivity likely representing native epithelial elements.  No significant TdT positivity is identified.  The small lymphocytes in the background show a mixture of T and B cells with predominance of T cells.  The latter show predominance of CD4 positive cells.  Overall, the features are atypical and most consistent with classical Hodgkin lymphoma which is best subclassified as nodular sclerosis type.   ASSESSMENT & PLAN:  Paul [name] is a 33 y.o. who presents for continued management of Hodgkin's lymphoma.   # Stage II bulky Classical Hodgkins  lymphoma -Presented as a large mediastinal mass causing some compression of his SVC and left brachiocephalic vein but no complete obstruction or symptoms related to this.No constitutional symptoms. -Started systemic chemotherapy with ABVD on 08/12/2022.  --Mid treatment PET from 10/19/2022 was reviewed and shows decrease in size and FDG avidity of dominant anterior mediastinal mass and additional nodes.  --Held Bleomycin  starting Cycle 4. Completed 6 cycles on 02/03/2023.  --Most treatment PET scan from 02/25/2023 showed interval increase in size and metabolic activity of right anterior mediastinal mass consistent with recurrence.  --Patient was seen at [facility] for transplant consideration and patient's parents declined consideration of AutoHSCT since patient would not have home support to handle this treatment. --Patient started second therapy with Adcentris plus Nivolumab  started on 03/24/2023. Held Adcentris starting with Cycle 2 due to severe neuropathic pain. PLAN: --Due for Cycle 14, Day 1 of Nivolumab  today --Labs from today were reviewed and adequate for treatment. WBC 4.9,  Hgb 13.4, Plt 215, creatinine and LFTs normal. --PET scan from 12/02/2023 shows treatment response with no signs to suggest any residual disease. Will order next scan for next month.  --Proceed with treatment today without any dose modifications. .    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   FOLLOW-UP: --RTC in 4 weeks with labs and follow up before Cycle 15, Day 1.     All of the patient's questions were answered with apparent satisfaction. The patient knows to call the clinic with any problems, questions or concerns.   I have spent a total of 30 minutes minutes of face-to-face and non-face-to-face time, preparing to see the patient, performing a medically appropriate examination, counseling and educating the  patient, ordering medications, documenting clinical information in the electronic health record, independently interpreting results and communicating results to the patient, and care coordination.   Johnston Police PA-C Dept of Hematology and Oncology [facility] Phone: [phone]\

## 2024-03-15 NOTE — Telephone Encounter (Signed)
 Spoke with patient mom who states that she received a letter from [facility] Neuropsychology department. Mom wanted to verify if he needed this appointment. Patient mom states that she has spoken with them and they are schedule about 9 months out. Patient currently has a neurologist that he is seeing so mom is not sure if this is still needed. I advise mom to reach back out to them to see who sent referral and to verify if the appointment is still needed. Patient mom ok information.

## 2024-03-15 NOTE — Telephone Encounter (Signed)
 Prescription has been sent to pharmacy.

## 2024-03-15 NOTE — Telephone Encounter (Signed)
 Copied from CRM #[id number]. Topic: Referral - Referrals/Orders Wake >> Mar 15, 2024 11:56 AM Drake BROCKS wrote: Kalix, Meinecke is calling other request    Include all details related to the request(s) below:  Patients mother calling in to advise she received referral letter in the mail for patient but there is no information on it, she would like a call back to speak with someone regarding what referral this is and to what doctors office   Confirm and type the Best Contact Number below:  Patient/caller contact number:        [phone]     [] Home  [x] Mobile  [] Work [] Other   [] Okay to leave a voicemail   Medication List:  Current Outpatient Medications:  .  albuterol  2.5 mg /3 mL (0.083 %) nebulizer solution, Take 3 mL (2.5 mg total) by nebulization 4 (four) times a day as needed for wheezing or shortness of breath., Disp: 100 mL, Rfl: 5 .  albuterol  HFA (PROVENTIL  HFA;VENTOLIN  HFA;PROAIR  HFA) 90 mcg/actuation inhaler, Inhale 180 mcg every 4 (four) hours., Disp: , Rfl:  .  ergocalciferol  (Vitamin D2) 1,250 mcg (50,000 unit) capsule, Take 1 capsule (50,000 Units total) by mouth once a week., Disp: 12 capsule, Rfl: 0 .  ibuprofen  (MOTRIN ) 800 mg tablet, , Disp: , Rfl:  .  lidocaine -prilocaine  (EMLA ) 2.5-2.5 % cream, , Disp: , Rfl:  .  multivitamin cap, Take 1 capsule by mouth Once Daily., Disp: , Rfl:  .  ondansetron  (ZOFRAN ) 4 mg tablet, Take 4 mg by mouth. (Patient not taking: Reported on 03/09/2024), Disp: , Rfl:  .  OXcarbazepine  (TrileptaL ) 300 mg tablet, Take 300 mg by mouth daily., Disp: , Rfl:  .  prochlorperazine  (COMPAZINE ) 10 mg tablet, Take 10 mg by mouth every 6 (six) hours as needed. (Patient not taking: Reported on 03/09/2024), Disp: , Rfl:      Medication Request/Refills: Pharmacy Information (if applicable)   [] Not Applicable       []  Pharmacy listed  Send Medication Request to:                                                 [] Pharmacy not listed (added to pharmacy  list in Epic) Send Medication Request to:      Listed Pharmacies: [facility] - [place], [place] - [address] [facility] AT [address] - PHONE: [phone] - FAX: [phone]

## 2024-03-15 NOTE — Patient Instructions (Signed)
 [facility] - A DEPT OF [facility]  Discharge Instructions: Thank you for choosing [facility] to provide your oncology and hematology care.   If you have a lab appointment with the Cancer Center, please go directly to the Cancer Center and check in at the registration area.   Wear comfortable clothing and clothing appropriate for easy access to any Portacath or PICC line.   We strive to give you quality time with your provider. You may need to reschedule your appointment if you arrive late (15 or more minutes).  Arriving late affects you and other patients whose appointments are after yours.  Also, if you miss three or more appointments without notifying the office, you may be dismissed from the clinic at the provider's discretion.      For prescription refill requests, have your pharmacy contact our office and allow 72 hours for refills to be completed.    Today you received the following chemotherapy and/or immunotherapy agents: Opdivo      To help prevent nausea and vomiting after your treatment, we encourage you to take your nausea medication as directed.  BELOW ARE SYMPTOMS THAT SHOULD BE REPORTED IMMEDIATELY: *FEVER GREATER THAN 100.4 F (38 C) OR HIGHER *CHILLS OR SWEATING *NAUSEA AND VOMITING THAT IS NOT CONTROLLED WITH YOUR NAUSEA MEDICATION *UNUSUAL SHORTNESS OF BREATH *UNUSUAL BRUISING OR BLEEDING *URINARY PROBLEMS (pain or burning when urinating, or frequent urination) *BOWEL PROBLEMS (unusual diarrhea, constipation, pain near the anus) TENDERNESS IN MOUTH AND THROAT WITH OR WITHOUT PRESENCE OF ULCERS (sore throat, sores in mouth, or a toothache) UNUSUAL RASH, SWELLING OR PAIN  UNUSUAL VAGINAL DISCHARGE OR ITCHING   Items with * indicate a potential emergency and should be followed up as soon as possible or go to the Emergency Department if any problems should occur.  Please show the CHEMOTHERAPY ALERT CARD or IMMUNOTHERAPY  ALERT CARD at check-in to the Emergency Department and triage nurse.  Should you have questions after your visit or need to cancel or reschedule your appointment, please contact [facility] - A DEPT OF [facility]  Dept: [phone]  and follow the prompts.  Office hours are 8:00 a.m. to 4:30 p.m. Monday - Friday. Please note that voicemails left after 4:00 p.m. may not be returned until the following business day.  We are closed weekends and major holidays. You have access to a nurse at all times for urgent questions. Please call the main number to the clinic Dept: [phone] and follow the prompts.   For any non-urgent questions, you may also contact your provider using MyChart. We now offer e-Visits for anyone 57 and older to request care online for non-urgent symptoms. For details visit [email or web address].   Also download the MyChart app! Go to the app store, search "MyChart", open the app, select , and log in with your MyChart username and password.

## 2024-03-17 ENCOUNTER — Encounter: Payer: Self-pay | Admitting: Hematology

## 2024-03-19 ENCOUNTER — Encounter: Payer: Self-pay | Admitting: Hematology

## 2024-03-20 ENCOUNTER — Other Ambulatory Visit: Payer: Self-pay

## 2024-03-20 ENCOUNTER — Encounter: Payer: Self-pay | Admitting: Hematology

## 2024-03-21 ENCOUNTER — Other Ambulatory Visit (HOSPITAL_COMMUNITY): Payer: Self-pay

## 2024-03-22 ENCOUNTER — Encounter: Payer: Self-pay | Admitting: Hematology

## 2024-03-22 ENCOUNTER — Other Ambulatory Visit (HOSPITAL_COMMUNITY): Payer: Self-pay

## 2024-03-23 ENCOUNTER — Other Ambulatory Visit: Payer: Self-pay

## 2024-03-24 ENCOUNTER — Other Ambulatory Visit (HOSPITAL_COMMUNITY): Payer: Self-pay

## 2024-03-24 ENCOUNTER — Other Ambulatory Visit: Payer: Self-pay

## 2024-03-29 ENCOUNTER — Encounter: Payer: Self-pay | Admitting: Hematology

## 2024-03-29 NOTE — Progress Notes (Signed)
 Per pt's Mother's request  and MD OK canceling  August tx. Pt is having issues with regression in behavior and memory. Family wants to see if there is any correlation in tx and his changes. Pt will have PET scan and then tx in Sept. Family will discuss any further concerns at that time with Dr Onesimo.

## 2024-03-30 ENCOUNTER — Telehealth: Payer: Self-pay | Admitting: Neurology

## 2024-03-30 ENCOUNTER — Encounter: Payer: Self-pay | Admitting: Hematology

## 2024-03-30 NOTE — Telephone Encounter (Signed)
 Pt's mother made request to cx appointment , she said it is not needed

## 2024-04-01 ENCOUNTER — Encounter: Payer: Self-pay | Admitting: Hematology

## 2024-04-03 ENCOUNTER — Ambulatory Visit: Admitting: Adult Health

## 2024-04-03 ENCOUNTER — Encounter: Payer: Self-pay | Admitting: Hematology

## 2024-04-03 NOTE — Progress Notes (Signed)
 Pt's Mother wanted to cancel August appts. Family wants to hold off on tx until pt has PET scan. Pt is having  cognitive issues, weakness in hands, and functional regression. Family feels like pt's tx is causing his issues since they have had other medical causes ruled out. Will update Dr Onesimo.

## 2024-04-04 ENCOUNTER — Telehealth: Payer: Self-pay | Admitting: Neurology

## 2024-04-04 NOTE — Telephone Encounter (Signed)
 PT mother called to request call back from Nurse . Mother states she was told to call [employer] concerning  Pt Cpap Machines . [employer] recommended Pt mother cal MD TO SEE IF 1. Cpap Machine is recording since Pt  has Contractor. Mother is requesting  a call from nurse to make  sure she is taking the corrects step to getting Son CPAP machine working properly. Pt mother also states that Pt is still having hard time sleeping well

## 2024-04-05 ENCOUNTER — Encounter: Payer: Self-pay | Admitting: Neurology

## 2024-04-05 NOTE — Telephone Encounter (Addendum)
 Spoke with pt's mother. She states pt has had issues with behavior, being tired/weak. He raised his arm like he would hit her one day. She said he did not and he is manageable. She is aware to call 911 if acutely fearing his, her, or others' safety.  She states he is on immunotherapy for his cancer and she feels the symptoms may be related. She said she is trying to rule out one thing at a time. She stopped pt's Trileptal  and his Cymbalta  (says psychiatry is aware). She is now going to have the patient skip at least 1-2 infusions to see if that shows improvement in his symptoms. She feels his cpap machine loaner is likely working ok because it isn't making the noises that the other machine was. I encouraged her to have us  or [facility] do a DL asap to make sure that machine is adequately treating him. He will not be eligible for a new machine until after 04/18/2025. She said pt's father will be coming for an appt on 8/18. Pt will be with him and will bring his machine and cord for a DL that day. She went ahead and scheduled pt's yearly visit for 06/27/24 at 945 am, check-in 930. Can move up appt sooner if needed. Her questions were answered. She thanked me for the call. Call time 17 min.

## 2024-04-10 ENCOUNTER — Ambulatory Visit: Admitting: Adult Health

## 2024-04-12 ENCOUNTER — Ambulatory Visit

## 2024-04-12 ENCOUNTER — Ambulatory Visit: Admitting: Hematology

## 2024-04-12 ENCOUNTER — Other Ambulatory Visit

## 2024-04-14 ENCOUNTER — Encounter (HOSPITAL_COMMUNITY)
Admission: RE | Admit: 2024-04-14 | Discharge: 2024-04-14 | Disposition: A | Discharge status: Home / Self Care | Source: Ambulatory Visit | Attending: Physician Assistant | Admitting: Physician Assistant

## 2024-04-14 DIAGNOSIS — C8102 Nodular lymphocyte predominant Hodgkin lymphoma, intrathoracic lymph nodes: Secondary | ICD-10-CM | POA: Insufficient documentation

## 2024-04-14 LAB — GLUCOSE, CAPILLARY: Glucose-Capillary: 94 mg/dL (ref 70–99)

## 2024-04-14 MED ORDER — FLUDEOXYGLUCOSE F - 18 (FDG) INJECTION
11.4100 | Freq: Once | INTRAVENOUS | Status: AC | PRN
  Administered 2024-04-14: 11.41 via INTRAVENOUS

## 2024-04-17 ENCOUNTER — Other Ambulatory Visit (HOSPITAL_COMMUNITY): Payer: Self-pay

## 2024-04-24 NOTE — Progress Notes (Signed)
 Initial neurology clinic note  Reason for Evaluation: Consultation requested by Lazoff, Shawn P, DO for an opinion regarding pain and weakness in hands. My final recommendations will be communicated back to the requesting physician by way of shared medical record or letter to requesting physician via US  mail.  HPI: This is Mr. Paul [name], a 33 y.o. right-handed male with a medical history of fetal alcohol syndrome, vit D deficiency, asthma, Hodgkin lymphoma, OSA on CPAP, OA who presents to neurology clinic with the chief complaint of pain and weakness in his hands. The patient is alone today.  Patient has difficulty knowing when his symptoms started. His mother is on the phone and per her this started after treatment with chemo for Hodgkins lymphoma. Symptoms started about 1.5 years ago. He does not have a strong grip. He gets coldness and hotness of his hands. He will have spasms and getting his hands stuck. It is right hand more than left hand. He uses his hands a lot with gaming and has not been able to do this as much and had to stop.  He was told by oncology that this was due to chemotherapy induced neuropathy. He stopped chemo about 1 year ago then started immune therapy. Since then, symptoms have gotten worse.  He had feet problems (pain) but this was felt to be due to plantar fasciitis. He was wearing cowboy boats and when these were switched to shoes, his symptoms went away (about 1 year ago). He denies this pain currently. He was put on Cymbalta . This did not help and made him aggressive, so this was stopped.   Oncology history per 03/15/24 note: ASSESSMENT & PLAN:  LESLY JOSLYN is a 33 y.o. who presents for continued management of Hodgkin's lymphoma.    # Stage II bulky Classical Hodgkins lymphoma -Presented as a large mediastinal mass causing some compression of his SVC and left brachiocephalic vein but no complete obstruction or symptoms related to this.No constitutional  symptoms. -Started systemic chemotherapy with ABVD on 08/12/2022.  --Mid treatment PET from 10/19/2022 was reviewed and shows decrease in size and FDG avidity of dominant anterior mediastinal mass and additional nodes.  --Held Bleomycin  starting Cycle 4. Completed 6 cycles on 02/03/2023.  --Most treatment PET scan from 02/25/2023 showed interval increase in size and metabolic activity of right anterior mediastinal mass consistent with recurrence.  --Patient was seen at [facility] for transplant consideration and patient's parents declined consideration of AutoHSCT since patient would not have home support to handle this treatment. --Patient started second therapy with Adcentris plus Nivolumab  started on 03/24/2023. Held Adcentris starting with Cycle 2 due to severe neuropathic pain. PLAN: --Due for Cycle 14, Day 1 of Nivolumab  today --Labs from today were reviewed and adequate for treatment. WBC 4.9,  Hgb 13.4, Plt 215, creatinine and LFTs normal. --PET scan from 12/02/2023 shows treatment response with no signs to suggest any residual disease. Will order next scan for next month.  --Proceed with treatment today without any dose modifications. .  Patient has seen [facility] for sleep and memory (OSA and concern for narcolepsy).  Family history: patient is adopted; had fetal alcohol syndrome  Mother would like to avoid medications if able as they tend to interact poorly with the patient and cause aggression or cognitive issues due to fetal alcohol syndrome   MEDICATIONS:  Outpatient Encounter Medications as of 05/04/2024  Medication Sig   albuterol  (PROVENTIL  HFA;VENTOLIN  HFA) 108 (90 BASE) MCG/ACT inhaler Inhale 2 puffs into  the lungs every 6 (six) hours as needed for wheezing or shortness of breath.   modafinil  (PROVIGIL ) 100 MG tablet Take 1 tablet (100 mg total) by mouth every morning.   Multiple Vitamin (MULTIVITAMIN WITH MINERALS) TABS tablet Take 1 tablet by mouth daily with breakfast.   Vitamin  D, Ergocalciferol , (DRISDOL ) 1.25 MG (50000 UNIT) CAPS capsule Take 50,000 Units by mouth every Monday.   albuterol  (PROVENTIL ) (2.5 MG/3ML) 0.083% nebulizer solution Take 3 mLs by nebulization 4 (four) times daily. (Patient not taking: Reported on 05/04/2024)   LIDOCAINE  EX Apply 1 Application topically as needed (Before immunotherapy). (Patient not taking: Reported on 05/04/2024)   Oxcarbazepine  (TRILEPTAL ) 300 MG tablet Take 1 tablet (300 mg total) by mouth 2 (two) times daily. (Patient not taking: Reported on 05/04/2024)   Vitamin D , Ergocalciferol , (DRISDOL ) 1.25 MG (50000 UNIT) CAPS capsule Take 1 capsule (50,000 Units total) by mouth once a week. (Patient not taking: Reported on 05/04/2024)   [DISCONTINUED] DULoxetine  (CYMBALTA ) 20 MG capsule Take 1 capsule (20 mg total) by mouth. (Patient not taking: Reported on 05/04/2024)   No facility-administered encounter medications on file as of 05/04/2024.    PAST MEDICAL HISTORY: Past Medical History:  Diagnosis Date   ADD (attention deficit disorder)    ADHD (attention deficit hyperactivity disorder)    Asthma    excercise induced and pollen   Auditory processing disorder    Complication of anesthesia    mother states pt. is harder to put under anes. and hard to wake up due to fetal alcohol syndrome; can be combative, per mother; states he will do better if mother is in PACU when he is coming out of anes.   Development delay    states mental status of a 33 year old   Exercise-induced asthma    prn inhaler/neb.   Fetal alcohol syndrome    Narcolepsy    Non-restorable tooth 09/2015   teeth   Separation anxiety    Twin birth     PAST SURGICAL HISTORY: Past Surgical History:  Procedure Laterality Date   IR IMAGING GUIDED PORT INSERTION  08/11/2022   MULTIPLE EXTRACTIONS WITH ALVEOLOPLASTY N/A 09/23/2015   Procedure: MULTIPLE EXTRACTION WITH ALVEOLOPLASTY;  Surgeon: Glendia Primrose, DDS;  Location: [facility];  Service: Oral  Surgery;  Laterality: N/A;   PYLOROMYOTOMY     age [age], performed found to not have stenosis on surgical exam   PYLOROMYOTOMY     did not have pyloric stenosis; did surgery on the wrong twin   RADIOLOGY WITH ANESTHESIA N/A 01/16/2020   Procedure: MRI WITH ANESTHESIA  BRAIN WITH AND WITHOUT CONTRAST;  Surgeon: Radiologist, Medication, MD;  Location: [facility] OR;  Service: Radiology;  Laterality: N/A;    ALLERGIES: Allergies  Allergen Reactions   Gabapentin      MAKES HIM GO INTO A DEEP SLEEP   Modafinil  Other (See Comments)    aggression   Other Other (See Comments)    Fetal alcohol syndrome  Certain (unnamed) medications have adverse effects on him. Must have heavy anesthesia meds in order to work.    FAMILY HISTORY: Family History  Adopted: Yes  Problem Relation Age of Onset   Mental illness Mother    Diabetes Mother    Hypertension Mother    Schizophrenia Mother    Bipolar disorder Mother    Mental retardation Father    Sleep apnea Neg Hx     SOCIAL HISTORY: Social History   Tobacco Use   Smoking status: Never  Smokeless tobacco: Never  Vaping Use   Vaping status: Never Used  Substance Use Topics   Alcohol use: No   Drug use: No   Social History   Social History Narrative   Are you right handed or left handed? Right   Are you currently employed ? no   What is your current occupation?   Do you live at home alone?   Who lives with you? family   What type of home do you live in: 1 story or 2 story? two    Caffeine 1 cup     OBJECTIVE: PHYSICAL EXAM: BP 107/73   Pulse 74   Ht 5' 8 (1.727 m)   Wt 259 lb (117.5 kg)   SpO2 98%   BMI 39.38 kg/m   General: General appearance: Awake and alert. No distress. Cooperative with exam.  Skin: No obvious rash or jaundice. HEENT: Atraumatic. Anicteric. Lungs: Non-labored breathing on room air  Heart: Regular Extremities: No edema. No obvious deformity.  Musculoskeletal: No obvious joint swelling. Psych: Flat  affect  Neurological: Mental Status: Alert. Speech fluent. No pseudobulbar affect Cranial Nerves: CNII: No RAPD. Visual fields grossly intact. CNIII, IV, VI: PERRL. No nystagmus. EOMI. CN V: Facial sensation intact bilaterally to fine touch. CN VII: Facial muscles symmetric and strong. No ptosis at rest. CN VIII: Hearing grossly intact bilaterally. CN IX: No hypophonia. CN X: Palate elevates symmetrically. CN XI: Full strength shoulder shrug bilaterally. CN XII: Tongue protrusion full and midline. No atrophy or fasciculations. No significant dysarthria Motor: Tone is normal. No atrophy. Strength 5/5 in bilateral upper and lower extremities including bilateral APB. Reflexes: Difficulty relaxing but appears to be 2+ throughout Pathological Reflexes: Babinski: flexor response bilaterally Hoffman: absent bilaterally Troemner: absent bilaterally Sensation: Phalen and Tinel's difficult to assess but no clear symptoms appreciated Pinprick: Intact in all extremities Vibration: Intact in all extremities Proprioception: Intact in bilateral great toes Coordination: Intact finger-to- nose-finger bilaterally. Romberg negative. Gait: Able to rise from chair with arms crossed unassisted. Normal, narrow-based gait.  Lab and Test Review: Internal labs: 03/15/24: CBC w/ diff unremarkable CMP unremarkable  TSH (12/22/23) wnl  External labs: 03/09/24: RF negative CCP negative Vit D wnl ESR wnl CRP wnl  B12 (03/25/23): 370  Imaging/Procedures: MRI brain w/wo contrast (01/16/20 for cognitive decline): FINDINGS: Brain: Midline structures are normally formed. Cerebral volume appears relatively normal.   No restricted diffusion to suggest acute infarction. No midline shift, mass effect, evidence of mass lesion, ventriculomegaly, extra-axial collection or acute intracranial hemorrhage. Cervicomedullary junction and pituitary are within normal limits.   Incidental broad-based dural  calcification at the vertex on the right. Elnor and white matter signal is within normal limits. No cortical encephalomalacia or chronic cerebral blood products identified. Cerebral morphology appears within normal limits.   No abnormal enhancement identified. No dural thickening.   Vascular: Major intracranial vascular flow voids are preserved. The major dural venous sinuses are enhancing and appear to be patent.   Skull and upper cervical spine: Negative visible cervical spine, bone marrow signal.   Sinuses/Orbits: Negative orbits. Minimal left ethmoid and maxillary sinus mucosal thickening or small retention cysts.   Other: The patient is intubated, study was performed under general anesthesia. Mastoids are clear. Visible internal auditory structures appear normal. Scalp and face soft tissues appear negative.   IMPRESSION: No acute intracranial abnormality, and unremarkable MRI appearance of the brain.  Polysomnography (01/25/20): IMPRESSION:  1. Obstructive Sleep Apnea (OSA)  2. Dysfunctions associated with sleep stages  or arousal from  sleep   EEG (02/01/20): This is a  normal awake EEG.  There is no electrodiagnostic evidence of epileptiform discharge.   ASSESSMENT: Paul [name] is a 33 y.o. male who presents for evaluation of hand numbness, tingling, pain, and weakness (right more than left). He has a relevant medical history of fetal alcohol syndrome, vit D deficiency, asthma, Hodgkin lymphoma, OSA on CPAP, OA. His neurological examination is essentially normal today. Available diagnostic data is significant for normal B12 in 2024 and normal MRI brain in 2021. The etiology of patient's symptoms is currently unclear. Given that symptoms started after chemotherapy and there may have been foot symptoms early that have since stopped, there was concern for chemotherapy induced neuropathy (was on vinblastine ). Symptoms also worsened with Nivolumab  immunotherapy which can also be  associated with neuropathy. That his symptoms seem localized to dominant hand and not so much in the feet anymore, and that his examination appears normal, there may be alternative etiology as well, such as carpal tunnel syndrome. I will get labs and EMG to evaluate further.  PLAN: -Blood work: B1, B12, IFE -EMG: RUE (may include RLE if looks like neuropathy)  -Return to clinic to be determined  The impression above as well as the plan as outlined below were extensively discussed with the patient (in the company of mother on phone) who voiced understanding. All questions were answered to their satisfaction.  When available, results of the above investigations and possible further recommendations will be communicated to the patient via telephone/MyChart. Patient to call office if not contacted after expected testing turnaround time.   Total time spent reviewing records, interview, history/exam, documentation, and coordination of care on day of encounter:  50 min   Thank you for allowing me to participate in patient's care.  If I can answer any additional questions, I would be pleased to do so.  Venetia Potters, MD   CC: Lazoff, Shawn P, DO [address] [place] [address]  CC: Referring provider: Macarthur Elouise SQUIBB, DO [address] [place],  [place] [address]

## 2024-04-24 NOTE — Telephone Encounter (Signed)
 SABRA

## 2024-04-24 NOTE — Telephone Encounter (Signed)
 Pt mother called  to follow up about report that was downloaded from Pt Cpap Machine . Mother state she wasn't;sure if she get a call or does she have to  call. But she will like to  know  those results

## 2024-04-24 NOTE — Telephone Encounter (Signed)
 Apnea control good on current settings, but leak is higher from the mask, recommend appointment with [facility] for a mask refit.  Please asked patient's mom to assist with making an appointment with DME provider.

## 2024-04-24 NOTE — Telephone Encounter (Signed)
 Spoke with patient's mother and discussed message from Dr Buck. Pt's mother verbalized understanding and appreciation. She will be in touch with [employer] to schedule a mask refit. She is also aware that for any future data view of machine, wyvonne is saying the machine is a card download only.

## 2024-04-25 ENCOUNTER — Other Ambulatory Visit (HOSPITAL_COMMUNITY): Payer: Self-pay

## 2024-04-25 MED ORDER — MODAFINIL 100 MG PO TABS
100.0000 mg | ORAL_TABLET | Freq: Every morning | ORAL | 0 refills | Status: DC
  Filled 2024-04-25 – 2024-05-01 (×2): qty 30, 30d supply, fill #0

## 2024-04-26 ENCOUNTER — Encounter: Payer: Self-pay | Admitting: Hematology

## 2024-04-26 ENCOUNTER — Ambulatory Visit: Admitting: Family Medicine

## 2024-04-26 ENCOUNTER — Other Ambulatory Visit (HOSPITAL_COMMUNITY): Payer: Self-pay

## 2024-04-28 ENCOUNTER — Telehealth: Payer: Self-pay | Admitting: Neurology

## 2024-04-28 NOTE — Telephone Encounter (Signed)
 Verneita the  ( mother) called in this morning and she wants to know if Dr. Leigh will allow her to be  on the phone with her son, which is the pt on 05-04-24 due to she can not attend the session with him that day. She  wants to be able to asks questions as well during the visit.

## 2024-05-01 ENCOUNTER — Other Ambulatory Visit: Payer: Self-pay

## 2024-05-01 ENCOUNTER — Other Ambulatory Visit (HOSPITAL_COMMUNITY): Payer: Self-pay

## 2024-05-04 ENCOUNTER — Other Ambulatory Visit

## 2024-05-04 ENCOUNTER — Ambulatory Visit (INDEPENDENT_AMBULATORY_CARE_PROVIDER_SITE_OTHER): Admitting: Neurology

## 2024-05-04 ENCOUNTER — Encounter: Payer: Self-pay | Admitting: Hematology

## 2024-05-04 ENCOUNTER — Encounter: Payer: Self-pay | Admitting: Neurology

## 2024-05-04 VITALS — BP 107/73 | HR 74 | Ht 68.0 in | Wt 259.0 lb

## 2024-05-04 DIAGNOSIS — M79642 Pain in left hand: Secondary | ICD-10-CM | POA: Diagnosis not present

## 2024-05-04 DIAGNOSIS — M79641 Pain in right hand: Secondary | ICD-10-CM | POA: Diagnosis not present

## 2024-05-04 DIAGNOSIS — C8192 Hodgkin lymphoma, unspecified, intrathoracic lymph nodes: Secondary | ICD-10-CM | POA: Diagnosis not present

## 2024-05-04 DIAGNOSIS — R29898 Other symptoms and signs involving the musculoskeletal system: Secondary | ICD-10-CM | POA: Diagnosis not present

## 2024-05-04 NOTE — Patient Instructions (Signed)
 I saw you today for pain and weakness in your hands.  I will get blood work today.  I will get an EMG (nerve testing) of your right arm and maybe your leg if this looks like neuropathy.  We will discuss results when I have them and determine next steps.  Please let me know if you have any questions or concerns in the meantime.   The physicians and staff at [facility] are committed to providing excellent care. You may receive a survey requesting feedback about your experience at our office. We strive to receive very good responses to the survey questions. If you feel that your experience would prevent you from giving the office a very good  response, please contact our office to try to remedy the situation. We may be reached at [phone]. Thank you for taking the time out of your busy day to complete the survey.  Venetia Potters, MD [facility]

## 2024-05-05 ENCOUNTER — Other Ambulatory Visit (HOSPITAL_COMMUNITY): Payer: Self-pay

## 2024-05-09 ENCOUNTER — Ambulatory Visit: Payer: Self-pay | Admitting: Neurology

## 2024-05-09 LAB — VITAMIN B1: Vitamin B1 (Thiamine): 18 nmol/L (ref 8–30)

## 2024-05-09 LAB — IMMUNOFIXATION ELECTROPHORESIS
IgG (Immunoglobin G), Serum: 1054 mg/dL (ref 600–1640)
IgM, Serum: 65 mg/dL (ref 50–300)
Immunoglobulin A: 288 mg/dL (ref 47–310)

## 2024-05-09 LAB — VITAMIN B12: Vitamin B-12: 576 pg/mL (ref 200–1100)

## 2024-05-10 ENCOUNTER — Inpatient Hospital Stay

## 2024-05-10 ENCOUNTER — Inpatient Hospital Stay: Attending: Hematology | Admitting: Hematology

## 2024-05-10 ENCOUNTER — Ambulatory Visit

## 2024-05-10 VITALS — BP 108/80 | HR 84 | Temp 97.9°F | Resp 18 | Ht 68.0 in | Wt 260.5 lb

## 2024-05-10 DIAGNOSIS — C8179 Other classical Hodgkin lymphoma, extranodal and solid organ sites: Secondary | ICD-10-CM | POA: Insufficient documentation

## 2024-05-10 DIAGNOSIS — Z5112 Encounter for antineoplastic immunotherapy: Secondary | ICD-10-CM

## 2024-05-10 DIAGNOSIS — Z7189 Other specified counseling: Secondary | ICD-10-CM

## 2024-05-10 DIAGNOSIS — C8192 Hodgkin lymphoma, unspecified, intrathoracic lymph nodes: Secondary | ICD-10-CM

## 2024-05-10 LAB — CMP (CANCER CENTER ONLY)
ALT: 30 U/L (ref 0–44)
AST: 24 U/L (ref 15–41)
Albumin: 4.2 g/dL (ref 3.5–5.0)
Alkaline Phosphatase: 122 U/L (ref 38–126)
Anion gap: 5 (ref 5–15)
BUN: 18 mg/dL (ref 6–20)
CO2: 28 mmol/L (ref 22–32)
Calcium: 9.4 mg/dL (ref 8.9–10.3)
Chloride: 105 mmol/L (ref 98–111)
Creatinine: 0.77 mg/dL (ref 0.61–1.24)
GFR, Estimated: >60 mL/min (ref >60)
Glucose, Bld: 97 mg/dL (ref 70–99)
Potassium: 3.7 mmol/L (ref 3.5–5.1)
Sodium: 138 mmol/L (ref 135–145)
Total Bilirubin: 0.3 mg/dL (ref 0.0–1.2)
Total Protein: 7 g/dL (ref 6.5–8.1)

## 2024-05-10 LAB — CBC WITH DIFFERENTIAL (CANCER CENTER ONLY)
Abs Immature Granulocytes: 0.01 K/uL (ref 0.00–0.07)
Basophils Absolute: 0.1 K/uL (ref 0.0–0.1)
Basophils Relative: 1 %
Eosinophils Absolute: 0.6 K/uL — ABNORMAL HIGH (ref 0.0–0.5)
Eosinophils Relative: 10 %
HCT: 39.9 % (ref 39.0–52.0)
Hemoglobin: 14.1 g/dL (ref 13.0–17.0)
Immature Granulocytes: 0 %
Lymphocytes Relative: 34 %
Lymphs Abs: 2.2 K/uL (ref 0.7–4.0)
MCH: 30.5 pg (ref 26.0–34.0)
MCHC: 35.3 g/dL (ref 30.0–36.0)
MCV: 86.4 fL (ref 80.0–100.0)
Monocytes Absolute: 0.6 K/uL (ref 0.1–1.0)
Monocytes Relative: 9 %
Neutro Abs: 3 K/uL (ref 1.7–7.7)
Neutrophils Relative %: 46 %
Platelet Count: 222 K/uL (ref 150–400)
RBC: 4.62 MIL/uL (ref 4.22–5.81)
RDW: 12 % (ref 11.5–15.5)
WBC Count: 6.5 K/uL (ref 4.0–10.5)
nRBC: 0 % (ref 0.0–0.2)

## 2024-05-11 ENCOUNTER — Ambulatory Visit: Admitting: Neurology

## 2024-05-12 ENCOUNTER — Encounter: Payer: Self-pay | Admitting: Neurology

## 2024-05-16 ENCOUNTER — Encounter: Payer: Self-pay | Admitting: Hematology

## 2024-05-16 NOTE — Progress Notes (Signed)
 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 Held Bleomycine starting Cycle 4. Completed 6 cycles on 02/03/2023.   CURRENT TREATMENT: --Second line therapy with Adcentris plus Nivolumab  started on 03/24/2023. Held adcentris starting with Cycle 2 due to neuropathy.   Interval History:  Paul [name] is a 33 year old male with a history of fetal alcohol syndrome and history of Hodgkin's lymphoma here for follow-up. His mother is on the phone to help with discussions regarding goals of care. Per mother's preference his immunotherapy was held last time due to concerns for change in behavior and more mean behavior which the mother strongly attributes to his immunotherapy. She notes that he has been behaving better since he has been off immunotherapy and feels like his usual self. We discussed his PET/CT scan which showed shows Deauville level 4 disease. Patient's mother understands that this could represent active lymphoma and as such we would continue immunotherapy to try to keep that under control but she would like to hold off on all treatments and also hold off on biopsy to confirm residual disease at this time and monitor chest status conservatively, We also discussed option for possible involved site radiation therapy which she also would like to decline at this time. Patient is in agreement with's mother's plan.   MEDICAL HISTORY:  Past Medical History:  Diagnosis Date   ADD (attention deficit disorder)    ADHD (attention deficit hyperactivity disorder)    Asthma    excercise induced and pollen   Auditory processing disorder    Complication of anesthesia    mother states  pt. is harder to put under anes. and hard to wake up due to fetal alcohol syndrome; can be combative, per mother; states he will do better if mother is in PACU when he is coming out of anes.   Development delay    states mental status of a 33 year old   Exercise-induced asthma    prn inhaler/neb.   Fetal alcohol syndrome    Narcolepsy    Non-restorable tooth 09/2015   teeth   Separation anxiety    Twin birth     SURGICAL HISTORY: Past Surgical History:  Procedure Laterality Date   IR IMAGING GUIDED PORT INSERTION  08/11/2022   MULTIPLE EXTRACTIONS WITH ALVEOLOPLASTY N/A 09/23/2015   Procedure: MULTIPLE EXTRACTION WITH ALVEOLOPLASTY;  Surgeon: Glendia Primrose, DDS;  Location: [facility];  Service: Oral Surgery;  Laterality: N/A;   PYLOROMYOTOMY     age [age], performed found to not have stenosis on surgical exam   PYLOROMYOTOMY     did not have pyloric stenosis; did surgery on the wrong twin   RADIOLOGY WITH ANESTHESIA N/A 01/16/2020   Procedure: MRI WITH ANESTHESIA  BRAIN WITH AND WITHOUT CONTRAST;  Surgeon: Radiologist, Medication, MD;  Location: [facility] OR;  Service: Radiology;  Laterality: N/A;    SOCIAL HISTORY: Social History   Socioeconomic History   Marital status: Single    Spouse name: Not on file   Number of children: Not on file   Years of education: Not on file   Highest education level: Not on file  Occupational History   Not on file  Tobacco Use   Smoking status: Never  Smokeless tobacco: Never  Vaping Use   Vaping status: Never Used  Substance and Sexual Activity   Alcohol use: No   Drug use: No   Sexual activity: Not on file  Other Topics Concern   Not on file  Social History Narrative   Are you right handed or left handed? Right   Are you currently employed ? no   What is your current occupation?   Do you live at home alone?   Who lives with you? family   What type of home do you live in: 1 story or 2 story? two    Caffeine 1 cup   Social  Drivers of Health   Financial Resource Strain: Low Risk  (04/09/2021)   Received from [facility] visits prior to 11/07/2022.   Overall Financial Resource Strain (CARDIA)    Difficulty of Paying Living Expenses: Not hard at all  Food Insecurity: No Food Insecurity (01/05/2024)   Hunger Vital Sign    Worried About Running Out of Food in the Last Year: Never true    Ran Out of Food in the Last Year: Never true  Transportation Needs: No Transportation Needs (01/05/2024)   PRAPARE - Administrator, Civil Service (Medical): No    Lack of Transportation (Non-Medical): No  Physical Activity: Insufficiently Active (04/09/2021)   Received from [facility] visits prior to 11/07/2022.   Exercise Vital Sign    On average, how many days per week do you engage in moderate to strenuous exercise (like a brisk walk)?: 5 days    On average, how many minutes do you engage in exercise at this level?: 10 min  Stress: No Stress Concern Present (04/09/2021)   Received from [facility] visits prior to 11/07/2022.   [employer] of Occupational Health - Occupational Stress Questionnaire    Feeling of Stress : Only a little  Social Connections: Socially Isolated (04/09/2021)   Received from [facility] visits prior to 11/07/2022.   Social Connection and Isolation Panel    In a typical week, how many times do you talk on the phone with family, friends, or neighbors?: Never    How often do you get together with friends or relatives?: Once a week    How often do you attend church or religious services?: Never    Do you belong to any clubs or organizations such as church groups, unions, fraternal or athletic groups, or school groups?: Yes    How often do you attend meetings of the clubs or organizations you belong to?: More than 4 times per year    Are you married, widowed, divorced, separated, never married, or living with a  partner?: Never married  Intimate Partner Violence: Not At Risk (01/05/2024)   Humiliation, Afraid, Rape, and Kick questionnaire    Fear of Current or Ex-Partner: No    Emotionally Abused: No    Physically Abused: No    Sexually Abused: No    FAMILY HISTORY: Family History  Adopted: Yes  Problem Relation Age of Onset   Mental illness Mother    Diabetes Mother    Hypertension Mother    Schizophrenia Mother    Bipolar disorder Mother    Mental retardation Father    Sleep apnea Neg Hx     ALLERGIES:  is allergic to gabapentin , modafinil , and other.  MEDICATIONS:  Current Outpatient Medications  Medication Sig Dispense Refill   albuterol  (  PROVENTIL  HFA;VENTOLIN  HFA) 108 (90 BASE) MCG/ACT inhaler Inhale 2 puffs into the lungs every 6 (six) hours as needed for wheezing or shortness of breath.     albuterol  (PROVENTIL ) (2.5 MG/3ML) 0.083% nebulizer solution Take 3 mLs by nebulization 4 (four) times daily. (Patient not taking: Reported on 05/04/2024) 90 mL 5   LIDOCAINE  EX Apply 1 Application topically as needed (Before immunotherapy). (Patient not taking: Reported on 05/04/2024)     modafinil  (PROVIGIL ) 100 MG tablet Take 1 tablet (100 mg total) by mouth every morning. 30 tablet 0   Multiple Vitamin (MULTIVITAMIN WITH MINERALS) TABS tablet Take 1 tablet by mouth daily with breakfast.     Vitamin D , Ergocalciferol , (DRISDOL ) 1.25 MG (50000 UNIT) CAPS capsule Take 50,000 Units by mouth every Monday.     No current facility-administered medications for this visit.    REVIEW OF SYSTEMS:    10 Point review of Systems was done is negative except as noted above.   PHYSICAL EXAMINATION  BP 108/80 (BP Location: Left Arm, Patient Position: Sitting)   Pulse 84   Temp 97.9 F (36.6 C) (Tympanic)   Resp 18   Ht 5' 8 (1.727 m)   Wt 260 lb 8 oz (118.2 kg)   SpO2 98%   BMI 39.61 kg/m  . GENERAL:alert, in no acute distress and comfortable SKIN: no acute rashes, no significant  lesions EYES: conjunctiva are pink and non-injected, sclera anicteric OROPHARYNX: MMM, no exudates, no oropharyngeal erythema or ulceration NECK: supple, no JVD LYMPH:  no palpable lymphadenopathy in the cervical, axillary or inguinal regions LUNGS: clear to auscultation b/l with normal respiratory effort HEART: regular rate & rhythm ABDOMEN:  normoactive bowel sounds , non tender, not distended. Extremity: no pedal edema PSYCH: alert & oriented x 3 with fluent speech NEURO: no focal motor/sensory deficits   LABORATORY DATA:  I have reviewed the data as listed  .    Latest Ref Rng & Units 05/10/2024   12:22 PM 03/15/2024   12:46 PM 02/16/2024    2:05 PM  CBC  WBC 4.0 - 10.5 K/uL 6.5  4.9  4.0   Hemoglobin 13.0 - 17.0 g/dL 85.8  86.5  86.6   Hematocrit 39.0 - 52.0 % 39.9  38.2  38.0   Platelets 150 - 400 K/uL 222  215  182    .    Latest Ref Rng & Units 05/10/2024   12:22 PM 03/15/2024   12:46 PM 02/16/2024    2:05 PM  CMP  Glucose 70 - 99 mg/dL 97  97  96   BUN 6 - 20 mg/dL 18  13  15    Creatinine 0.61 - 1.24 mg/dL 9.22  9.13  9.22   Sodium 135 - 145 mmol/L 138  139  138   Potassium 3.5 - 5.1 mmol/L 3.7  3.9  3.7   Chloride 98 - 111 mmol/L 105  105  106   CO2 22 - 32 mmol/L 28  29  28    Calcium 8.9 - 10.3 mg/dL 9.4  9.2  9.2   Total Protein 6.5 - 8.1 g/dL 7.0  6.6  6.7   Total Bilirubin 0.0 - 1.2 mg/dL 0.3  0.3  0.4   Alkaline Phos 38 - 126 U/L 122  109  104   AST 15 - 41 U/L 24  23  24    ALT 0 - 44 U/L 30  22  28     I have personally reviewed the radiological images as listed  and agreed with the findings in the report. No results found.   Surgical Pathology result from 07/03/2022: FINAL MICROSCOPIC DIAGNOSIS:  A. MEDIASTINAL MASS, ANTERIOR, NEEDLE CORE BIOPSY: -Atypical lymphoid proliferation consistent with classical Hodgkin lymphoma -See comment  COMMENT:  The sections show needle core biopsy fragments displaying a nodular and diffuse lymphoid proliferation  associated with dense sclerosis.  The lymphoid process shows predominance of small lymphoid cells admixed to a lesser extent with histiocytes, eosinophils in addition to the large atypical mononuclear and occasionally lobated lymphoid appearing cells with variably prominent nucleoli.  Flow cytometric analysis was performed [facility] [phone]) and shows predominance of CD4-positive T cells. No monoclonal B-cell population identified.  In addition, a battery of immunohistochemical stains was performed including CD30, CD15, mum 1, LCA, CD20, PAX5, CD3, CD5, CD4, CD8, TdT, cytokeratin AE1/AE3, cytokeratin 8/18 and EBV in situ hybridization with appropriate controls.  The large atypical lymphoid appearing cells are positive for CD15, CD30, PAX5, mum 1 and rare cells for CD20.  No significant staining is seen with LCA, EBV, CD3, CD5, CD4, CD8.  Cytokeratin stains highlight a small focus of positivity likely representing native epithelial elements.  No significant TdT positivity is identified.  The small lymphocytes in the background show a mixture of T and B cells with predominance of T cells.  The latter show predominance of CD4 positive cells.  Overall, the features are atypical and most consistent with classical Hodgkin lymphoma which is best subclassified as nodular sclerosis type.   ASSESSMENT & PLAN:  INRI SOBIESKI is a 33 y.o. who presents for continued management of Hodgkin's lymphoma.   # Stage II bulky Classical Hodgkins lymphoma -Presented as a large mediastinal mass causing some compression of his SVC and left brachiocephalic vein but no complete obstruction or symptoms related to this.No constitutional symptoms. -Started systemic chemotherapy with ABVD on 08/12/2022.  --Mid treatment PET from 10/19/2022 was reviewed and shows decrease in size and FDG avidity of dominant anterior mediastinal mass and additional nodes.  --Held Bleomycin  starting Cycle 4. Completed 6 cycles on 02/03/2023.   --Most treatment PET scan from 02/25/2023 showed interval increase in size and metabolic activity of right anterior mediastinal mass consistent with recurrence.  --Patient was seen at [facility] for transplant consideration and patient's parents declined consideration of AutoHSCT since patient would not have home support to handle this treatment. --Patient started second therapy with Adcentris plus Nivolumab  started on 03/24/2023. Held Adcentris starting with Cycle 2 due to severe neuropathic pain. PLAN: Patient is due for his next cycle of nivolumab  today but would like to hold off on this in keeping with input from his mother who feels nivolumab  is causing some mean behavior. Patient is in good spirits today. Recent PET CT scan 04/14/2024 shows 1. Persistent matted soft tissue density in the anterior mediastinum consistent with treated tumor. The right-sided soft tissue density is slightly smaller but demonstrates slight increased FDG uptake (3.4 versus previous 2.8). The left-sided soft tissue density is stable in size but there is slightly increased FDG uptake (4.3 versus prior 2.8). Deauville 4. Recommend continued surveillance. 2. No other new or progressive findings.   We discussed that we cannot be sure there is not active lymphoma other. We discussed that we typically would refer the patient to a surgeon for biopsy in that area to confirm this is a lymphoma to see if there is any change in treatment warranted. Alternative options would be to consider palliative radiation therapy to the residual areas of  disease continue immunotherapy both of which the patient and his mother would like to hold off at this time. Patient mother wants us  to monitor his Hodgkin's lymphoma without any additional interventions at this time. 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   FOLLOW-UP: --RTC in 4 weeks with labs and follow up before   ..gkdone to a science.

## 2024-05-16 NOTE — Telephone Encounter (Signed)
 RE: did he get a new machine?? Received: Nilsa Joylene Adine Neysa Nena GORMAN, RN; New, Bradley; Cain, Mitchell; Tucker, Dolanda; Ziegler, Melissa; 1 other Hello,  The mask fit was done on 03/16/2024 @ 9am. Looks like he went from a [employer] Med to a f20 MED  Patient not eligable for new pap unit until 04/2025 through insurance.  Most recent supply order 05-12-2024 after trouble shoot completed 05-01-2024.  I added you to air view viewing rights. 57% last 30 days and last used 04-23-2024   Thank you,  Bra

## 2024-05-17 ENCOUNTER — Other Ambulatory Visit (HOSPITAL_COMMUNITY): Payer: Self-pay

## 2024-05-17 ENCOUNTER — Other Ambulatory Visit: Payer: Self-pay

## 2024-05-17 MED ORDER — OXCARBAZEPINE 300 MG PO TABS
300.0000 mg | ORAL_TABLET | Freq: Every day | ORAL | 0 refills | Status: DC
  Filled 2024-05-17: qty 30, 30d supply, fill #0

## 2024-05-17 MED ORDER — SERTRALINE HCL 50 MG PO TABS
50.0000 mg | ORAL_TABLET | Freq: Every morning | ORAL | 0 refills | Status: AC
  Filled 2024-05-17: qty 30, 30d supply, fill #0

## 2024-05-18 ENCOUNTER — Other Ambulatory Visit: Payer: Self-pay

## 2024-05-18 ENCOUNTER — Other Ambulatory Visit (HOSPITAL_COMMUNITY): Payer: Self-pay

## 2024-05-30 ENCOUNTER — Encounter: Payer: Self-pay | Admitting: Hematology

## 2024-06-09 ENCOUNTER — Other Ambulatory Visit (HOSPITAL_COMMUNITY): Payer: Self-pay

## 2024-06-12 ENCOUNTER — Ambulatory Visit: Admitting: Hematology

## 2024-06-12 ENCOUNTER — Other Ambulatory Visit

## 2024-06-27 ENCOUNTER — Ambulatory Visit: Admitting: Neurology

## 2024-06-30 ENCOUNTER — Encounter: Payer: Self-pay | Admitting: Hematology

## 2024-07-02 ENCOUNTER — Encounter: Payer: Self-pay | Admitting: Neurology

## 2024-07-03 ENCOUNTER — Encounter: Payer: Self-pay | Admitting: Neurology

## 2024-07-03 ENCOUNTER — Ambulatory Visit (INDEPENDENT_AMBULATORY_CARE_PROVIDER_SITE_OTHER): Admitting: Neurology

## 2024-07-03 ENCOUNTER — Telehealth: Payer: Self-pay

## 2024-07-03 DIAGNOSIS — M79642 Pain in left hand: Secondary | ICD-10-CM

## 2024-07-03 DIAGNOSIS — M79641 Pain in right hand: Secondary | ICD-10-CM | POA: Diagnosis not present

## 2024-07-03 DIAGNOSIS — G5603 Carpal tunnel syndrome, bilateral upper limbs: Secondary | ICD-10-CM

## 2024-07-03 MED ORDER — WRIST SPLINT/COCK-UP/RIGHT L MISC: 2 refills | Status: AC

## 2024-07-03 MED ORDER — WRIST SPLINT/COCK-UP/LEFT L MISC: 2 refills | Status: AC

## 2024-07-03 NOTE — Procedures (Signed)
  [name]  [address], [address]  [place], [place] [address] Tel: [phone] Fax: [phone] Test Date:  07/03/2024  Patient: Paul [name] DOB: 12-Apr-1991 Physician: Venetia Potters, MD  Sex: Male Height: 5' 8 Ref Phys: Venetia Potters, MD  ID#: [id number]   Technician:    History: This is a 33 year old male with pain and weakness in his hands.  NCV & EMG Findings: Extensive electrodiagnostic evaluation of the right upper limb with additional nerve conduction studies in the left upper limb shows: Right median sensory response shows prolonged distal peak latency (4.0 ms) and reduced amplitude (18 V). Left median sensory response shows prolonged distal peak latency (3.5 ms). Right ulnar and radial sensory responses are within normal limits. Right median (APB) motor response shows prolonged distal onset latency (4.1 ms) and reduced amplitude (4.9 mV). Right ulnar (ADM) motor response is within normal limits. There is no evidence of active or chronic motor axon loss changes affecting any of the tested muscles on needle examination. Motor unit configuration and recruitment pattern is within normal limits.  Impression: This is an abnormal study. The findings are most consistent with the following: Bilateral median mononeuropathy at or distal to the wrist, consistent with carpal tunnel syndrome. The findings are moderate in degree electrically on the right and mild in degree electrically on the left. No electrodiagnostic evidence of right cervical (C5-C8) motor radiculopathy. Screening studies for right ulnar or radial mononeuropathies are normal.   ___________________________ Venetia Potters, MD    Nerve Conduction Studies Motor Nerve Results    Latency Amplitude F-Lat Segment Distance CV Comment  Site (ms) Norm (mV) Norm (ms)  (cm) (m/s) Norm   Right Median (APB) Motor  Wrist *4.1  < 3.9 *4.9  > 6.0        Elbow 8.9 - 4.8 -  Elbow-Wrist 28 58  > 50   Right Ulnar  (ADM) Motor  Wrist 1.90  < 3.1 9.0  > 7.0        Bel elbow 5.3 - 8.5 -  Bel elbow-Wrist 22.5 66  > 50   Ab elbow 7.0 - 8.5 -  Ab elbow-Bel elbow 10 59 -    Sensory Sites    Neg Peak Lat Amplitude (O-P) Segment Distance Velocity Comment  Site (ms) Norm (V) Norm  (cm) (ms)   Left Median Sensory  Wrist-Dig II *3.5  < 3.4 20  > 20 Wrist-Dig II 13    Right Median Sensory  Wrist-Dig II *4.0  < 3.4 *18  > 20 Wrist-Dig II 13    Right Radial Sensory  Forearm-Wrist 2.1  < 2.7 20  > 18 Forearm-Wrist 10    Right Ulnar Sensory  Wrist-Dig V 2.8  < 3.1 16  > 12 Wrist-Dig V 11     Electromyography   Side Muscle Ins.Act Fibs Fasc Recrt Amp Dur Poly Activation Comment  Right FDI Nml Nml Nml Nml Nml Nml Nml Nml N/A  Right Pronator teres Nml Nml Nml Nml Nml Nml Nml Nml N/A  Right Biceps Nml Nml Nml Nml Nml Nml Nml Nml N/A  Right Triceps lat hd Nml Nml Nml Nml Nml Nml Nml Nml N/A  Right Deltoid Nml Nml Nml Nml Nml Nml Nml Nml N/A      Waveforms:  Motor      Sensory

## 2024-07-03 NOTE — Telephone Encounter (Signed)
-----   Message from [name] sent at 07/03/2024  2:28 PM EDT ----- Regarding: Order for bilateral carpal tunnel braces Paul [name],  Can you order bilateral carpal tunnel braces for this patient? Per Mom, insurance should cover as she normally gets this type of thing through [employer]. It may also be called cock up wrist splints. He needs a left and right one.  Thank you,  Venetia

## 2024-07-06 ENCOUNTER — Other Ambulatory Visit: Payer: Self-pay

## 2024-07-06 ENCOUNTER — Other Ambulatory Visit (HOSPITAL_COMMUNITY): Payer: Self-pay

## 2024-07-06 DIAGNOSIS — C8192 Hodgkin lymphoma, unspecified, intrathoracic lymph nodes: Secondary | ICD-10-CM

## 2024-07-07 ENCOUNTER — Inpatient Hospital Stay: Attending: Hematology

## 2024-07-07 ENCOUNTER — Inpatient Hospital Stay: Admitting: Hematology

## 2024-07-07 ENCOUNTER — Other Ambulatory Visit (HOSPITAL_COMMUNITY): Payer: Self-pay

## 2024-07-07 VITALS — BP 104/78 | HR 90 | Temp 97.7°F | Resp 20 | Wt 262.9 lb

## 2024-07-07 DIAGNOSIS — C8192 Hodgkin lymphoma, unspecified, intrathoracic lymph nodes: Secondary | ICD-10-CM

## 2024-07-07 DIAGNOSIS — C8179 Other classical Hodgkin lymphoma, extranodal and solid organ sites: Secondary | ICD-10-CM | POA: Insufficient documentation

## 2024-07-07 LAB — CMP (CANCER CENTER ONLY)
ALT: 35 U/L (ref 0–44)
AST: 27 U/L (ref 15–41)
Albumin: 4.1 g/dL (ref 3.5–5.0)
Alkaline Phosphatase: 130 U/L — ABNORMAL HIGH (ref 38–126)
Anion gap: 4 — ABNORMAL LOW (ref 5–15)
BUN: 14 mg/dL (ref 6–20)
CO2: 31 mmol/L (ref 22–32)
Calcium: 9.2 mg/dL (ref 8.9–10.3)
Chloride: 105 mmol/L (ref 98–111)
Creatinine: 0.9 mg/dL (ref 0.61–1.24)
GFR, Estimated: >60 mL/min (ref >60)
Glucose, Bld: 105 mg/dL — ABNORMAL HIGH (ref 70–99)
Potassium: 3.7 mmol/L (ref 3.5–5.1)
Sodium: 140 mmol/L (ref 135–145)
Total Bilirubin: 0.4 mg/dL (ref 0.0–1.2)
Total Protein: 6.8 g/dL (ref 6.5–8.1)

## 2024-07-07 LAB — CBC WITH DIFFERENTIAL (CANCER CENTER ONLY)
Abs Immature Granulocytes: 0.01 K/uL (ref 0.00–0.07)
Basophils Absolute: 0.1 K/uL (ref 0.0–0.1)
Basophils Relative: 1 %
Eosinophils Absolute: 1 K/uL — ABNORMAL HIGH (ref 0.0–0.5)
Eosinophils Relative: 18 %
HCT: 39.5 % (ref 39.0–52.0)
Hemoglobin: 13.9 g/dL (ref 13.0–17.0)
Immature Granulocytes: 0 %
Lymphocytes Relative: 35 %
Lymphs Abs: 2 K/uL (ref 0.7–4.0)
MCH: 30.7 pg (ref 26.0–34.0)
MCHC: 35.2 g/dL (ref 30.0–36.0)
MCV: 87.2 fL (ref 80.0–100.0)
Monocytes Absolute: 0.5 K/uL (ref 0.1–1.0)
Monocytes Relative: 9 %
Neutro Abs: 2.1 K/uL (ref 1.7–7.7)
Neutrophils Relative %: 37 %
Platelet Count: 204 K/uL (ref 150–400)
RBC: 4.53 MIL/uL (ref 4.22–5.81)
RDW: 12.4 % (ref 11.5–15.5)
WBC Count: 5.8 K/uL (ref 4.0–10.5)
nRBC: 0 % (ref 0.0–0.2)

## 2024-07-11 ENCOUNTER — Other Ambulatory Visit: Payer: Self-pay

## 2024-07-17 NOTE — Progress Notes (Signed)
 HEMATOLOGY ONCOLOGY PROGRESS NOTE  Date of service: 07/07/2024  Patient Care Team: Lazoff, Elouise SQUIBB, DO as PCP - General (Family Medicine)  CHIEF COMPLAINT/PURPOSE OF CONSULTATION: Follow-up for continued evaluation and management of  Classical Hodgkins Lymphoma.  HISTORY OF PRESENTING ILLNESS: (07/08/2022) Paul [name] is a wonderful 33 y.o. male who has been referred to us  by .Lazoff, Elouise SQUIBB, DO and Dr. Lige Alstrom MD for evaluation and management of newly diagnosed mediastinal mass concerning for likely lymphoma.   Patient has a history of fetal alcohol syndrome with developmental delay, narcolepsy, ADD, exercise-induced asthma and lives with his adoptive parents. He recently presented to the hospital on 07/01/2022 with 2-week history of worsening shortness of breath cough and wheezing.  He received his asthma treatment but was still noted to have significant cough.  In the emergency room he had a chest x-ray which showed an mediastinal mass. Subsequent CT chest with contrast on 07/01/2022 showed extensive mediastinal and hilar lymphadenopathy with conglomerate soft tissue mass/adenopathy within the anterior mediastinum measuring 11.2 x 7.9 x 13 cm. Compression of the left brachiocephalic and SVC within the mid though these vascular structures remain patent.   Patient did not have any clinical signs or symptoms of SVC compression syndrome or left upper extremity swelling.   Patient subsequently had a CT of the abdomen and pelvis which showed no acute intra-abdominal or intrapelvic abnormalities. He underwent a CT-guided core needle biopsy of the mediastinal mass by interventional radiology.   The official pathology results from his biopsy are not currently available at the time of this clinic visit. I did call and talk to the pathologist Dr. Frutoso and she notes that this either looks like a lymphoma or thymoma and she is running additional tests to make a final diagnosis.    Patient's father accompanied him for this visit and his mother who helps make most of the decisions in tandem with his father was present on the phone.  They do not note any unexplained fevers chills night sweats or significant weight loss.  His breathing has been relatively stable since hospital discharge but he is still having some cough.   I confirmed adequacy of tissue sampling with the pathologist and the patient was then started on prednisone  to try to help his cough by drinking his mediastinal tumor some and reducing bronchial inflammation.   Patient is unable to provide much information or review of systems on account of his developmental delay issues.  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 Held Bleomycine starting Cycle 4. Completed 6 cycles on 02/03/2023.    CURRENT TREATMENT: --Second line therapy with Adcentris plus Nivolumab  started on 03/24/2023. Held adcentris starting with Cycle 2 due to neuropathy.   INTERVAL HISTORY:  Paul [name] is a 33 y.o. male  with a history of fetal alcohol syndrome and history of Hodgkin's lymphoma here for today for continued evaluation and management of  Classical Hodgkins Lymphoma. accompanied by mother, who is on the phone.   he was last seen by me on 05/10/2024; at the  time his mother mentioned patient's mood had improved since stopping immunotherapy.   Today, he says that he is doing well. Denies SOB, chest pain, abdominal pain lumps/bumps, fever/chills, night sweats, or allergy symptoms.  His mother reports that she was  told that his recent fatigue may be an endocrinology issue (Possible low testosterone levels) per his lab work, and they'll be seeing Endocrinology soon regarding this. He is reportedly more sluggish during the day, but he's not too irritable. Said to be sleeping well.  His recent weight gain is noted to possibly also related to his Endocrinology issues.  Regarding his Carpal Tunnel, he's not experiencing much hand pain, but is still losing some grip.  His mother queries when Paul [name] can have his port removed.   He says that his foot pain has improved since changing shoes.  Not too irritable - he has recently discontinued one of his medications  REVIEW OF SYSTEMS:   10 Point review of systems of done and is negative except as noted above.  MEDICAL HISTORY Past Medical History:  Diagnosis Date   ADD (attention deficit disorder)    ADHD (attention deficit hyperactivity disorder)    Asthma    excercise induced and pollen   Auditory processing disorder    Complication of anesthesia    mother states pt. is harder to put under anes. and hard to wake up due to fetal alcohol syndrome; can be combative, per mother; states he will do better if mother is in PACU when he is coming out of anes.   Development delay    states mental status of a 33 year old   Exercise-induced asthma    prn inhaler/neb.   Fetal alcohol syndrome    Narcolepsy    Non-restorable tooth 09/2015   teeth   Separation anxiety    Twin birth     SURGICAL HISTORY Past Surgical History:  Procedure Laterality Date   IR IMAGING GUIDED PORT INSERTION  08/11/2022   MULTIPLE EXTRACTIONS WITH ALVEOLOPLASTY N/A 09/23/2015   Procedure: MULTIPLE EXTRACTION WITH ALVEOLOPLASTY;  Surgeon: Glendia Primrose, DDS;  Location: [facility];  Service: Oral Surgery;  Laterality: N/A;   PYLOROMYOTOMY     age [age], performed found to not have stenosis on surgical exam   PYLOROMYOTOMY     did not have pyloric stenosis; did surgery on  the wrong twin   RADIOLOGY WITH ANESTHESIA N/A 01/16/2020   Procedure: MRI WITH ANESTHESIA  BRAIN WITH AND WITHOUT CONTRAST;  Surgeon: Radiologist, Medication, MD;  Location: [facility] OR;  Service: Radiology;  Laterality: N/A;    SOCIAL HISTORY Social History   Tobacco Use   Smoking status: Never   Smokeless tobacco: Never  Vaping Use   Vaping status: Never Used  Substance Use Topics   Alcohol use: No   Drug use: No    Social History   Social History Narrative   Are you right handed or left handed? Right   Are you currently employed ? no   What is your current occupation?   Do you live at home alone?   Who lives with you? family   What type of home do you live in: 1 story or 2 story? two    Caffeine 1 cup    SOCIAL DRIVERS OF HEALTH SDOH Screenings   Food Insecurity: Low Risk  (06/15/2024)   Received from [facility]  Housing: Low Risk  (06/15/2024)   Received from [facility]  Transportation Needs: No Transportation Needs (01/05/2024)  Utilities: Low  Risk  (06/15/2024)   Received from [facility]  Depression (PHQ2-9): Low Risk  (03/15/2024)  Financial Resource Strain: Low Risk  (04/09/2021)   Received from [facility] visits prior to 11/07/2022.  Physical Activity: Insufficiently Active (04/09/2021)   Received from [facility] visits prior to 11/07/2022.  Social Connections: Socially Isolated (04/09/2021)   Received from [facility] visits prior to 11/07/2022.  Stress: No Stress Concern Present (04/09/2021)   Received from [facility] visits prior to 11/07/2022.  Tobacco Use: Low Risk  (05/04/2024)     FAMILY HISTORY Family History  Adopted: Yes  Problem Relation Age of Onset   Mental illness Mother    Diabetes Mother    Hypertension Mother    Schizophrenia Mother    Bipolar disorder Mother    Mental retardation Father    Sleep apnea Neg Hx      ALLERGIES: is allergic to gabapentin ,  modafinil , and other.  MEDICATIONS  Current Outpatient Medications  Medication Sig Dispense Refill   albuterol  (PROVENTIL  HFA;VENTOLIN  HFA) 108 (90 BASE) MCG/ACT inhaler Inhale 2 puffs into the lungs every 6 (six) hours as needed for wheezing or shortness of breath.     Elastic Bandages & Supports (WRIST SPLINT/COCK-UP/LEFT L) MISC Patient to wear on left wrist nightly 1 each 2   Elastic Bandages & Supports (WRIST SPLINT/COCK-UP/RIGHT L) MISC Patient to wear nightly on Right wrist 1 each 2   modafinil  (PROVIGIL ) 100 MG tablet Take 1 tablet (100 mg total) by mouth every morning. 30 tablet 0   Multiple Vitamin (MULTIVITAMIN WITH MINERALS) TABS tablet Take 1 tablet by mouth daily with breakfast.     sertraline  (ZOLOFT ) 50 MG tablet Take 1 tablet (50 mg total) by mouth every morning after meal. 30 tablet 0   Vitamin D , Ergocalciferol , (DRISDOL ) 1.25 MG (50000 UNIT) CAPS capsule Take 50,000 Units by mouth every Monday.     No current facility-administered medications for this visit.    PHYSICAL EXAMINATION: ECOG PERFORMANCE STATUS: 1 - Symptomatic but completely ambulatory VITALS: Vitals:   07/07/24 1215  BP: 104/78  Pulse: 90  Resp: 20  Temp: 97.7 F (36.5 C)  SpO2: 98%   Filed Weights   07/07/24 1215  Weight: 262 lb 14.4 oz (119.3 kg)   Body mass index is 39.97 kg/m.  GENERAL: alert, in no acute distress and comfortable SKIN: no acute rashes, no significant lesions EYES: conjunctiva are pink and non-injected, sclera anicteric OROPHARYNX: MMM, no exudates, no oropharyngeal erythema or ulceration NECK: supple, no JVD LYMPH:  no palpable lymphadenopathy in the cervical, axillary or inguinal regions LUNGS: clear to auscultation b/l with normal respiratory effort HEART: regular rate & rhythm ABDOMEN:  normoactive bowel sounds , non tender, not distended, no hepatosplenomegaly Extremity: no pedal edema PSYCH: alert & oriented x 3 with fluent speech NEURO: no focal motor/sensory  deficits  LABORATORY DATA:   I have reviewed the data as listed     Latest Ref Rng & Units 07/07/2024   11:41 AM 05/10/2024   12:22 PM 03/15/2024   12:46 PM  CBC EXTENDED  WBC 4.0 - 10.5 K/uL 5.8  6.5  4.9   RBC 4.22 - 5.81 MIL/uL 4.53  4.62  4.42   Hemoglobin 13.0 - 17.0 g/dL 86.0  85.8  86.5   HCT 39.0 - 52.0 % 39.5  39.9  38.2   Platelets 150 - 400 K/uL 204  222  215  NEUT# 1.7 - 7.7 K/uL 2.1  3.0  1.9   Lymph# 0.7 - 4.0 K/uL 2.0  2.2  2.2    Eosinophils Absolute 1.0 High    06/22/2024 Testosterone 264 - 916 ng/dL 571  Testosterone, % Free & Weakly Bound 9.0 - 46.0 % 28.5   TSH 0.450 - 5.330 uIU/mL 1.942   Vitamin D  25-Hydroxy 30.0 - 100.0 ng/mL 60.3   Vitamin B-12 180 - 914 pg/mL 602   Hemoglobin A1c <5.7 % 5.7 High   Comment: Normal A1c:             Less than 5.7% Prediabetes range A1c:  5.7% to 6.4% Diabetes range A1c:     Greater than 6.4%  Estimated Average Glucose mg/dL 882   Cortisol ug/dL 6.3   ACTH, Plasma 7.2 - 63.0 pg/mL 21.1   Prolactin 2.6 - 13.1 ng/mL 6.1   T4, Free 0.6 - 1.1 ng/dL 0.5 Low       Latest Ref Rng & Units 07/07/2024   11:41 AM 05/10/2024   12:22 PM 03/15/2024   12:46 PM  CMP  Glucose 70 - 99 mg/dL 894  97  97   BUN 6 - 20 mg/dL 14  18  13    Creatinine 0.61 - 1.24 mg/dL 9.09  9.22  9.13   Sodium 135 - 145 mmol/L 140  138  139   Potassium 3.5 - 5.1 mmol/L 3.7  3.7  3.9   Chloride 98 - 111 mmol/L 105  105  105   CO2 22 - 32 mmol/L 31  28  29    Calcium 8.9 - 10.3 mg/dL 9.2  9.4  9.2   Total Protein 6.5 - 8.1 g/dL 6.8  7.0  6.6   Total Bilirubin 0.0 - 1.2 mg/dL 0.4  0.3  0.3   Alkaline Phos 38 - 126 U/L 130  122  109   AST 15 - 41 U/L 27  24  23    ALT 0 - 44 U/L 35  30  22    RADIOGRAPHIC STUDIES: I have personally reviewed the radiological images as listed and agreed with the findings in the report. NCV with EMG(electromyography) Result Date: 07/03/2024 Leigh Venetia CROME, MD     07/03/2024  2:27 PM [facility]rology [address] [place], [address]  [place], [place] [address] Tel: [phone] Fax: [phone] Test Date:  07/03/2024 Patient: Perley Arthurs DOB: 04/28/1991 Physician: Venetia Leigh, MD Sex: Male Height: 5' 8 Ref Phys: Venetia Leigh, MD ID#: [id number]   Technician:  History: This is a 33 year old male with pain and weakness in his hands. NCV & EMG Findings: Extensive electrodiagnostic evaluation of the right upper limb with additional nerve conduction studies in the left upper limb shows: Right median sensory response shows prolonged distal peak latency (4.0 ms) and reduced amplitude (18 V). Left median sensory response shows prolonged distal peak latency (3.5 ms). Right ulnar and radial sensory responses are within normal limits. Right median (APB) motor response shows prolonged distal onset latency (4.1 ms) and reduced amplitude (4.9 mV). Right ulnar (ADM) motor response is within normal limits. There is no evidence of active or chronic motor axon loss changes affecting any of the tested muscles on needle examination. Motor unit configuration and recruitment pattern is within normal limits. Impression: This is an abnormal study. The findings are most consistent with the following: Bilateral median mononeuropathy at or distal to the wrist, consistent with carpal tunnel syndrome. The findings are moderate in  degree electrically on the right and mild in degree electrically on the left. No electrodiagnostic evidence of right cervical (C5-C8) motor radiculopathy. Screening studies for right ulnar or radial mononeuropathies are normal. ___________________________ Venetia Potters, MD Nerve Conduction Studies Motor Nerve Results   Latency Amplitude F-Lat Segment Distance CV Comment Site (ms) Norm (mV) Norm (ms)  (cm) (m/s) Norm  Right Median (APB) Motor Wrist *4.1  < 3.9 *4.9  > 6.0       Elbow 8.9 - 4.8 -  Elbow-Wrist 28 58  > 50  Right Ulnar (ADM) Motor Wrist 1.90  < 3.1 9.0  > 7.0       Bel elbow 5.3 - 8.5 -  Bel elbow-Wrist  22.5 66  > 50  Ab elbow 7.0 - 8.5 -  Ab elbow-Bel elbow 10 59 -  Sensory Sites   Neg Peak Lat Amplitude (O-P) Segment Distance Velocity Comment Site (ms) Norm (V) Norm  (cm) (ms)  Left Median Sensory Wrist-Dig II *3.5  < 3.4 20  > 20 Wrist-Dig II 13   Right Median Sensory Wrist-Dig II *4.0  < 3.4 *18  > 20 Wrist-Dig II 13   Right Radial Sensory Forearm-Wrist 2.1  < 2.7 20  > 18 Forearm-Wrist 10   Right Ulnar Sensory Wrist-Dig V 2.8  < 3.1 16  > 12 Wrist-Dig V 11   Electromyography  Side Muscle Ins.Act Fibs Fasc Recrt Amp Dur Poly Activation Comment Right FDI Nml Nml Nml Nml Nml Nml Nml Nml N/A Right Pronator teres Nml Nml Nml Nml Nml Nml Nml Nml N/A Right Biceps Nml Nml Nml Nml Nml Nml Nml Nml N/A Right Triceps lat hd Nml Nml Nml Nml Nml Nml Nml Nml N/A Right Deltoid Nml Nml Nml Nml Nml Nml Nml Nml N/A Waveforms: Motor    Sensory           ASSESSMENT & PLAN:   33 year old male with 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  Less likely thymoma. On diagnosis.The mass is causing some compression of his SVC and left brachiocephalic vein but no complete obstruction or symptoms related to this.  . 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 Patient and parents declined consideration of Auto HSCT  Switched to 2nd line Adcetris + Nivolumab . Adcentris dropped after C1 due to hand pain  PET?CT 07/29/2023 -- showed good response to treatment PET/CT /327/2025 -- stable. Deauville 2 Patients parent decided to discontinue Nivolumab  treatment in 03/2024 since they felt it was making chrles selley and act out (in the context of his fetal alcohol syndrome). Pet/ct 04/14/2024- no change in size of mediastinal LN with slight increase in FDG avidity-- Deauville 4. Offered option to refer patient to CT surgery for Bx -- declined by parents -- want to take conservative approach and monitor disease. Patient asymptomatic.  PLAN: - Discussed lab results on 07/07/2024 in detail with patient:  CBC  normal with exception of eosinophils at 1.0K, elevated from 0.6K. CMP stable.   - Reviewed lab results from 06/22/2024: TSH and T4: 1.94 TSH and 0.5 T4.  Testosterone: 428  - Answered question about removing port:  Informed them that there is still some concern based on PET/CT from 04/14/2024 that there might be some residual disease that might progress.  Discussed risk vs benefit of keeping port in until next scan vs taking it out due to bothersome symptoms.  Portflush in 6 weeks PET/CT in 12 weeks RTC with Dr Onesimo with portflush and labs in 14 weeks    FOLLOW-UP  Portflush in 6 weeks PET/CT in 12 weeks RTC with Dr Onesimo with portflush and labs in 14 weeks    The total time spent in the appointment was 32 minutes* .  All of the patient's questions were answered and the patient knows to call the clinic with any problems, questions, or concerns.  Emaline Onesimo MD MS AAHIVMS [facility] Hematology/Oncology Physician [facility]  *Total Encounter Time as defined by the Centers for Medicare and Medicaid Services includes, in addition to the face-to-face time of a patient visit (documented in the note above) non-face-to-face time: obtaining and reviewing outside history, ordering and reviewing medications, tests or procedures, care coordination (communications with other health care  professionals or caregivers) and documentation in the medical record.  I,Emily Lagle,acting as a neurosurgeon for Emaline Onesimo, MD.,have documented all relevant documentation on the behalf of Emaline Onesimo, MD,as directed by  Emaline Onesimo, MD while in the presence of Emaline Onesimo, MD.  I have reviewed the above documentation for accuracy and completeness, and I agree with the above.  Yamili Lichtenwalner, MD

## 2024-07-18 NOTE — Progress Notes (Incomplete)
 HEMATOLOGY ONCOLOGY PROGRESS NOTE  Date of service: 07/07/2024  Patient Care Team: Lazoff, Elouise SQUIBB, DO as PCP - General (Family Medicine)  CHIEF COMPLAINT/PURPOSE OF CONSULTATION: Follow-up for continued evaluation and management of  Classical Hodgkins Lymphoma.  HISTORY OF PRESENTING ILLNESS: (07/08/2022) Paul [name] is a wonderful 33 y.o. male who has been referred to us  by .Macarthur, Shawn P, DO and Dr. Lige Alstrom MD for evaluation and management of newly diagnosed mediastinal mass concerning for likely lymphoma.   Patient has a history of fetal alcohol syndrome with developmental delay, narcolepsy, ADD, exercise-induced asthma and lives with his adoptive parents. He recently presented to the hospital on 07/01/2022 with 2-week history of worsening shortness of breath cough and wheezing.  He received his asthma treatment but was still noted to have significant cough.  In the emergency room he had a chest x-ray which showed an mediastinal mass. Subsequent CT chest with contrast on 07/01/2022 showed extensive mediastinal and hilar lymphadenopathy with conglomerate soft tissue mass/adenopathy within the anterior mediastinum measuring 11.2 x 7.9 x 13 cm. Compression of the left brachiocephalic and SVC within the mid though these vascular structures remain patent.   Patient did not have any clinical signs or symptoms of SVC compression syndrome or left upper extremity swelling.   Patient subsequently had a CT of the abdomen and pelvis which showed no acute intra-abdominal or intrapelvic abnormalities. He underwent a CT-guided core needle biopsy of the mediastinal mass by interventional radiology.   The official pathology results from his biopsy are not currently available at the time of this clinic visit. I did call and talk to the pathologist Dr. Frutoso and she notes that this either looks like a lymphoma or thymoma and she is running additional tests to make a final diagnosis.    Patient's father accompanied him for this visit and his mother who helps make most of the decisions in tandem with his father was present on the phone.  They do not note any unexplained fevers chills night sweats or significant weight loss.  His breathing has been relatively stable since hospital discharge but he is still having some cough.   I confirmed adequacy of tissue sampling with the pathologist and the patient was then started on prednisone  to try to help his cough by drinking his mediastinal tumor some and reducing bronchial inflammation.   Patient is unable to provide much information or review of systems on account of his developmental delay issues.  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 Held Bleomycine starting Cycle 4. Completed 6 cycles on 02/03/2023.    CURRENT TREATMENT: --Second line therapy with Adcentris plus Nivolumab  started on 03/24/2023. Held adcentris starting with Cycle 2 due to neuropathy.   INTERVAL HISTORY: Paul [name] is a 33 y.o. male  with a history of fetal alcohol syndrome and history of Hodgkin's lymphoma here for today for continued evaluation and management of  Classical Hodgkins Lymphoma. accompanied by mother, who is on the phone.   he was last seen by me on 05/10/2024; at the time  his mother mentioned patient's mood had improved since stopping immunotherapy.   Today, he says that he is doing well.   His mother reports that   Mother on phone He's doing well Carpal Tunnel in his hands Blood work endo problem, going to see endo  related to fatigue Feet doing better since changing shoes No shob chest pain abd pain lumps bunmps feverchill night sweats  Sleeping well Sluggish during the day Not too irritable, taken him off medication(??) related to his fetal alcohol syndrome.  TSH low, which is more concerning due to is FAS  Not having much hand pain, still losing some grip Been referred   When can remove port - Still some active disease - Keep port until next scan vs taking it out due to bothersome symptoms.  - eosinophilks elevated,  No allergy sx noted  Has gained some weight, could be related to endo   Denies any  []  new infection issues,  []  fevers/chills,  []  drenching night sweats,  []  unexpected weight change,  []  back pain,  []  chest pain,  []  abdominal pain,  []  new bone pains,  []  new lumps/bumps,  []  leg swelling,  []  bleeding issues (nose bleeds, gum bleeds, abnormal/spontaneous bruising),  []  nausea/vomiting,  []  diarrhea,  []  bowel/urinary changes, []  SOB,  []  change in breathing        REVIEW OF SYSTEMS:   10 Point review of systems of done and is negative except as noted above.  MEDICAL HISTORY Past Medical History:  Diagnosis Date  . ADD (attention deficit disorder)   . ADHD (attention deficit hyperactivity disorder)   . Asthma    excercise induced and pollen  . Auditory processing disorder   . Complication of anesthesia    mother states pt. is harder to put under anes. and hard to wake up due to fetal alcohol syndrome; can be combative, per mother; states he will do better if mother is in PACU when he is coming out of anes.  . Development delay    states mental status of a 33 year old  . Exercise-induced asthma    prn inhaler/neb.  . Fetal alcohol syndrome   . Narcolepsy   . Non-restorable tooth 09/2015   teeth  . Separation anxiety   . Twin birth     SURGICAL HISTORY Past Surgical History:  Procedure Laterality Date  . IR IMAGING GUIDED PORT INSERTION   08/11/2022  . MULTIPLE EXTRACTIONS WITH ALVEOLOPLASTY N/A 09/23/2015   Procedure: MULTIPLE EXTRACTION WITH ALVEOLOPLASTY;  Surgeon: Glendia Primrose, DDS;  Location: [facility];  Service: Oral Surgery;  Laterality: N/A;  . PYLOROMYOTOMY     age [age], performed found to not have stenosis on surgical exam  . PYLOROMYOTOMY     did not have pyloric stenosis; did surgery on the wrong twin  . RADIOLOGY WITH ANESTHESIA N/A 01/16/2020   Procedure: MRI WITH ANESTHESIA  BRAIN WITH AND WITHOUT CONTRAST;  Surgeon: Radiologist, Medication, MD;  Location: [facility] OR;  Service: Radiology;  Laterality: N/A;    SOCIAL HISTORY Social History   Tobacco Use  . Smoking status: Never  . Smokeless tobacco: Never  Vaping Use  . Vaping status: Never Used  Substance Use Topics  . Alcohol use: No  . Drug use: No    Social History   Social History Narrative   Are you right handed or left handed? Right   Are you currently employed ? no   What is your current occupation?  Do you live at home alone?   Who lives with you? family   What type of home do you live in: 1 story or 2 story? two    Caffeine 1 cup    SOCIAL DRIVERS OF HEALTH SDOH Screenings   Food Insecurity: Low Risk  (06/15/2024)   Received from [facility]  Housing: Low Risk  (06/15/2024)   Received from [facility]  Transportation Needs: No Transportation Needs (01/05/2024)  Utilities: Low Risk  (06/15/2024)   Received from [facility]  Depression (PHQ2-9): Low Risk  (03/15/2024)  Financial Resource Strain: Low Risk  (04/09/2021)   Received from [facility] visits prior to 11/07/2022.  Physical Activity: Insufficiently Active (04/09/2021)   Received from [facility] visits prior to 11/07/2022.  Social Connections: Socially Isolated (04/09/2021)   Received from [facility] visits prior to 11/07/2022.  Stress: No Stress Concern Present (04/09/2021)   Received from [facility]  [facility] visits prior to 11/07/2022.  Tobacco Use: Low Risk  (05/04/2024)     FAMILY HISTORY Family History  Adopted: Yes  Problem Relation Age of Onset  . Mental illness Mother   . Diabetes Mother   . Hypertension Mother   . Schizophrenia Mother   . Bipolar disorder Mother   . Mental retardation Father   . Sleep apnea Neg Hx      ALLERGIES: is allergic to gabapentin , modafinil , and other.  MEDICATIONS  Current Outpatient Medications  Medication Sig Dispense Refill  . albuterol  (PROVENTIL  HFA;VENTOLIN  HFA) 108 (90 BASE) MCG/ACT inhaler Inhale 2 puffs into the lungs every 6 (six) hours as needed for wheezing or shortness of breath.    Clinical Research Associate Bandages & Supports (WRIST SPLINT/COCK-UP/LEFT L) MISC Patient to wear on left wrist nightly 1 each 2  . Elastic Bandages & Supports (WRIST SPLINT/COCK-UP/RIGHT L) MISC Patient to wear nightly on Right wrist 1 each 2  . modafinil  (PROVIGIL ) 100 MG tablet Take 1 tablet (100 mg total) by mouth every morning. 30 tablet 0  . Multiple Vitamin (MULTIVITAMIN WITH MINERALS) TABS tablet Take 1 tablet by mouth daily with breakfast.    . sertraline  (ZOLOFT ) 50 MG tablet Take 1 tablet (50 mg total) by mouth every morning after meal. 30 tablet 0  . Vitamin D , Ergocalciferol , (DRISDOL ) 1.25 MG (50000 UNIT) CAPS capsule Take 50,000 Units by mouth every Monday.     No current facility-administered medications for this visit.    PHYSICAL EXAMINATION: ECOG PERFORMANCE STATUS: {CHL ONC ECOG ED:8845999799} VITALS: Vitals:   07/07/24 1215  BP: 104/78  Pulse: 90  Resp: 20  Temp: 97.7 F (36.5 C)  SpO2: 98%   Filed Weights   07/07/24 1215  Weight: 262 lb 14.4 oz (119.3 kg)   Body mass index is 39.97 kg/m.  GENERAL: alert, in no acute distress and comfortable SKIN: no acute rashes, no significant lesions EYES: conjunctiva are pink and non-injected, sclera anicteric OROPHARYNX: MMM, no exudates, no oropharyngeal erythema or  ulceration NECK: supple, no JVD LYMPH:  no palpable lymphadenopathy in the cervical, axillary or inguinal regions LUNGS: clear to auscultation b/l with normal respiratory effort HEART: regular rate & rhythm ABDOMEN:  normoactive bowel sounds , non tender, not distended, no hepatosplenomegaly Extremity: no pedal edema PSYCH: alert & oriented x 3 with fluent speech NEURO: no focal motor/sensory deficits  LABORATORY DATA:   I have reviewed the data as listed     Latest Ref Rng & Units  07/07/2024   11:41 AM 05/10/2024   12:22 PM 03/15/2024   12:46 PM  CBC EXTENDED  WBC 4.0 - 10.5 K/uL 5.8  6.5  4.9   RBC 4.22 - 5.81 MIL/uL 4.53  4.62  4.42   Hemoglobin 13.0 - 17.0 g/dL 86.0  85.8  86.5   HCT 39.0 - 52.0 % 39.5  39.9  38.2   Platelets 150 - 400 K/uL 204  222  215   NEUT# 1.7 - 7.7 K/uL 2.1  3.0  1.9   Lymph# 0.7 - 4.0 K/uL 2.0  2.2  2.2     {ELAddOncLabs (Optional):33736}     Latest Ref Rng & Units 07/07/2024   11:41 AM 05/10/2024   12:22 PM 03/15/2024   12:46 PM  CMP  Glucose 70 - 99 mg/dL 894  97  97   BUN 6 - 20 mg/dL 14  18  13    Creatinine 0.61 - 1.24 mg/dL 9.09  9.22  9.13   Sodium 135 - 145 mmol/L 140  138  139   Potassium 3.5 - 5.1 mmol/L 3.7  3.7  3.9   Chloride 98 - 111 mmol/L 105  105  105   CO2 22 - 32 mmol/L 31  28  29    Calcium 8.9 - 10.3 mg/dL 9.2  9.4  9.2   Total Protein 6.5 - 8.1 g/dL 6.8  7.0  6.6   Total Bilirubin 0.0 - 1.2 mg/dL 0.4  0.3  0.3   Alkaline Phos 38 - 126 U/L 130  122  109   AST 15 - 41 U/L 27  24  23    ALT 0 - 44 U/L 35  30  22      RADIOGRAPHIC STUDIES: I have personally reviewed the radiological images as listed and agreed with the findings in the report. 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  Less likely thymoma. Given the patient's age most likely differential would include Hodgkin's lymphoma and primary mediastinal large B-cell lymphoma. The mass is causing some compression of his SVC and left brachiocephalic vein but no complete obstruction or symptoms related to this. He does not note normal his parents any obvious constitutional symptoms.  PLAN: - Discussed lab results on 07/07/2024 in detail with patient: CBC showed {ELGKCBC:33763} CMP with Creatinine *** {ELIncreased/Decreased:33631} from *** and Calcium *** {ELIncreased/Decreased:33631} from ***.  {ELGKMyeloma&Lightchains (Optional):33764}  FOLLOW-UP in {WEEKS/MONTHS:30939} for labs and follow-up with Dr. Onesimo.  The total time spent in the appointment was *** minutes* .  All of the patient's questions were answered and the patient knows to call the clinic with any problems, questions, or concerns.  Emaline Onesimo MD MS AAHIVMS [facility] Hematology/Oncology Physician [facility]  *Total Encounter Time as defined by the Centers for Medicare and Medicaid Services includes, in addition to the face-to-face time of a patient visit (documented in the note above) non-face-to-face time: obtaining and reviewing outside history, ordering and reviewing medications, tests or procedures, care coordination (communications with other health care professionals or caregivers) and documentation in the medical record.  I,Emily Lagle,acting as a neurosurgeon for Emaline Onesimo, MD.,have documented all relevant documentation on the behalf of Emaline Onesimo, MD,as directed by  Emaline Onesimo, MD while in the presence of Emaline Onesimo, MD.  I have  reviewed the above documentation for accuracy and completeness, and I agree with the above.  Gautam Kale, MD

## 2024-07-23 ENCOUNTER — Encounter: Payer: Self-pay | Admitting: Hematology

## 2024-07-25 ENCOUNTER — Encounter: Payer: Self-pay | Admitting: Hematology

## 2024-07-26 ENCOUNTER — Ambulatory Visit: Admitting: Adult Health

## 2024-07-27 ENCOUNTER — Other Ambulatory Visit (HOSPITAL_COMMUNITY): Payer: Self-pay

## 2024-07-30 ENCOUNTER — Other Ambulatory Visit (HOSPITAL_COMMUNITY): Payer: Self-pay

## 2024-08-10 ENCOUNTER — Ambulatory Visit: Payer: Medicare HMO | Admitting: Adult Health

## 2024-08-14 ENCOUNTER — Telehealth: Payer: Self-pay | Admitting: Neurology

## 2024-08-14 ENCOUNTER — Encounter: Payer: Self-pay | Admitting: Hematology

## 2024-08-14 NOTE — Telephone Encounter (Signed)
 Appointment details confirmed

## 2024-08-17 ENCOUNTER — Other Ambulatory Visit (HOSPITAL_COMMUNITY): Payer: Self-pay

## 2024-08-17 ENCOUNTER — Other Ambulatory Visit: Payer: Self-pay

## 2024-08-17 MED ORDER — TRIAMCINOLONE ACETONIDE 0.1 % EX CREA
1.0000 | TOPICAL_CREAM | Freq: Two times a day (BID) | CUTANEOUS | 0 refills | Status: AC
  Filled 2024-08-17: qty 80, 40d supply, fill #0

## 2024-08-18 ENCOUNTER — Inpatient Hospital Stay: Attending: Hematology

## 2024-08-18 ENCOUNTER — Inpatient Hospital Stay

## 2024-08-18 DIAGNOSIS — Z452 Encounter for adjustment and management of vascular access device: Secondary | ICD-10-CM | POA: Diagnosis present

## 2024-08-18 DIAGNOSIS — C8192 Hodgkin lymphoma, unspecified, intrathoracic lymph nodes: Secondary | ICD-10-CM | POA: Diagnosis not present

## 2024-08-21 ENCOUNTER — Ambulatory Visit: Admitting: Adult Health

## 2024-09-02 ENCOUNTER — Other Ambulatory Visit: Payer: Self-pay

## 2024-09-04 ENCOUNTER — Other Ambulatory Visit (HOSPITAL_COMMUNITY): Payer: Self-pay

## 2024-09-11 ENCOUNTER — Other Ambulatory Visit (HOSPITAL_COMMUNITY): Payer: Self-pay

## 2024-09-11 ENCOUNTER — Other Ambulatory Visit: Payer: Self-pay

## 2024-09-11 MED ORDER — LEVOTHYROXINE SODIUM 50 MCG PO TABS
50.0000 ug | ORAL_TABLET | Freq: Every morning | ORAL | 3 refills | Status: AC
  Filled 2024-09-11: qty 90, 90d supply, fill #0

## 2024-09-12 ENCOUNTER — Other Ambulatory Visit (HOSPITAL_COMMUNITY): Payer: Self-pay

## 2024-09-27 ENCOUNTER — Ambulatory Visit: Admitting: Neurology

## 2024-09-27 ENCOUNTER — Encounter: Payer: Self-pay | Admitting: Neurology

## 2024-09-27 VITALS — BP 117/78 | HR 80 | Ht 70.0 in | Wt 260.2 lb

## 2024-09-27 DIAGNOSIS — G4733 Obstructive sleep apnea (adult) (pediatric): Secondary | ICD-10-CM | POA: Diagnosis not present

## 2024-09-27 NOTE — Progress Notes (Signed)
 Subjective:    Patient ID: Paul [name] is a 35 y.o. male.  HPI    Interim history:   Paul [name] is a 34 year old male with an underlying medical history of Hodgkin's lymphoma, allergies, fetal alcohol syndrome, with developmental delays, ADHD, vitamin D  deficiency, asthma, and obesity, who presents for follow-up consultation of his obstructive sleep apnea.  The patient is accompanied by his mother today.   Today, 09/27/2024 (all dictated new, as well as above notes, some dictation done in note pad or Word, outside of chart, may appear as copied):   I reviewed his AutoPap compliance data from the past 30 days, during which time he used his machine 27 days with percent use days greater than 4 hours at 63%, indicating mildly suboptimal compliance with an average usage of 5 hours and 21 minutes for days on treatment, residual AHI at goal at 1.9/h, leak on the high side with the 95th percentile at 98 L/min on a pressure of 9 cm with EPR of 3.  His mother provides his history.  She reports that he is a restless sleeper and has a tendency to pull his mask off in the middle of the night.  Sometimes he falls asleep without it on.  He sleeps on the bed with a mild elevation.  He does not sleep with his dentures in.  He has seen Dr. Leigh at [facility] for carpal tunnel syndrome.  He has also seen Dr. Scot with [facility] for memory loss.  The patient's allergies, current medications, family history, past medical history, past social history, past surgical history and problem list were reviewed and updated as appropriate.    Previously:   08/09/2023: He was last seen in sleep clinic by Duwaine Russell, NP on 09/17/2022, at which time he was not fully compliant with his CPAP of 9 cm.  He was encouraged to be consistent with treatment.   I reviewed his CPAP compliance data from the past 90 days during which time he used his machine 36 out of 90 days between 04/29/2023 and 07/27/2023.   Percent use days greater than 4 hours was only 13%, indicating significantly low compliance.  Average usage for days on treatment of 3 hours and 2 minutes.  Residual AHI 2.5/h, leak on the higher side with the 95th percentile at 42.5 L/min on a pressure of 9 cm with EPR of 3.  He reports daytime sleepiness.  He does not wake up fully rested.  In the past year he used his CPAP 158 out of 365 days with percent use days greater than 4 hours of only 14%, average usage for days on treatment of only 3 hours and 16 minutes. He reports feeling sleepy during the day.  His father reports that he was started on Provigil  about a month ago by his PCP.  He no longer takes Cymbalta .  Patient admits that he does not always use his CPAP, he reports that he has to frequently help his mom, he sleeps in his mom's bedroom currently, his mom has had significant medical problems and requires a lot of help, per patient's dad, she is currently bed ridden.  Patient's Epworth sleepiness score is 23 out of 24.   He has been referred to [facility] by his PCP for further memory evaluation.  He is followed closely by oncology/hematology for his Hodgkin's lymphoma.  He had an admission to the hospital in September 2024 for sepsis.  He had a recent restaging PET scan  on 07/29/2023, report is pending.      06/11/20: (He) presents for follow-up consultation of his cognitive decline.  He is accompanied by his mother again today.  I first met him at the request of his primary care physician on 01/03/2020, at which time his mother reported a cognitive decline over the previous 18 months.  His MMSE was 21 out of 30 at the time.  He was unable to give his own history.  He had had an MRI many years ago.  I suggested further work-up in the form of brain MRI, EEG and sleep testing.  He had a brain MRI with and without contrast under anesthesia as requested by mom on 01/16/2020 and I reviewed the results: IMPRESSION: No acute intracranial abnormality, and  unremarkable MRI appearance of the brain.  He had an EEG on 02/01/2020 and I reviewed the results: CONCLUSION: This is a  normal awake EEG.  There is no electrodiagnostic evidence of epileptiform discharge.   He had a baseline sleep study on 01/25/2020 which showed a sleep efficiency of 87.2%, sleep latency 1.5 minutes, REM latency was 4 minutes, REM percentage 16.9%.  He had moderate to severe obstructive sleep apnea with a total AHI of 18.3/h, REM AHI of 59.5/h, and supine AHI of 18.2/h.  Average oxygen saturation was 94%, nadir was 85%.  He was invited back for a CPAP titration study, which he had on 02/28/2020.  He was fitted with a small Simplus full facemask and CPAP was titrated from 5cm to 9 cm.  On the final pressure his AHI was 0.8/h with supine REM sleep achieved an O2 nadir of 92%.  Sleep efficiency was 98.8%, sleep latency 3 minutes, REM latency was 9.5 minutes, REM percentage normal at 22.3%.      We kept his mother up-to-date with all his test results.   I reviewed his CPAP compliance data from 05/11/2020 through 06/09/2020, which is a total of 30 days, during which time he used his CPAP 14 days with percent use days greater than 4 hours at 0%, indicating significantly low compliance with an average usage of 1 hour and 55 minutes, residual AHI 0.6/h, leak on the high side with a 95th percentile at 26 L/min on a pressure of 9 cm with EPR of 3. He reports using his CPAP.  His mom reports that he uses it every night or nearly every night.  However, it is possible that he does not put it on every night and perhaps take it off in the middle of the night as she sleeps in a different bedroom.  She reports that he seems to function better some days than others.  His memory loss is about the same.  She is currently not in favor of seeking more in-depth evaluation with a memory disorders clinic.   01/03/20: (He) has had cognitive decline over the past 18 months per mom.  The patient is minimally verbal and  not able to provide any of his history.  She adopted him when he was 7.  He has a twin brother who had an abnormality on his brain.  Both the patient and his twin brother had MRIs many years ago, MRI results of any brain MRI or CT had are not available for my review today.  He has had increase in frustration, some mood irritability, no aggression.  He has been noted to become slower in his movements and less vigilant in the past 1 year.  Sometimes he has a staring  spell or zones out, no convulsions.  She is not sure if he had an EEG in the past but would like to get an EEG done.  She believes that he would not be able to lay still for an MRI and would like to have it done under anesthesia but also reports that he had complications from anesthesia including apneas when he had dental surgery under anesthesia and was sent home, and stopped breathing at home.  She did not have to go to the ER with him as he started breathing on his own. He has had sleepiness during the day and his Epworth sleepiness score is high at 22 out of 24, fatigue severity score is 63 out of 63. He snores. I reviewed your office note from 12/26/2019.  He had a sleep study about 4 years ago which was negative for sleep apnea at the time.  I reviewed blood test results which were available through your records: TSH was normal, free T4 normal, B12 385, lipid panel showed LDL of 81, total cholesterol 122, triglycerides 78, HDL 30, vitamin D  was low at 12, CMP showed BUN of 11, creatinine of 1.8, alkaline phosphatase elevated at 153, liver enzymes otherwise normal, CBC with differential was unremarkable. He is able to do his own ADLs but takes longer.  It takes him 4 hours to do the dishes.  He plays computer games but seems to have become less quick with games that he has been able to do well before.      His Past Medical History Is Significant For: Past Medical History:  Diagnosis Date   ADD (attention deficit disorder)    ADHD (attention  deficit hyperactivity disorder)    Asthma    excercise induced and pollen   Auditory processing disorder    Complication of anesthesia    mother states pt. is harder to put under anes. and hard to wake up due to fetal alcohol syndrome; can be combative, per mother; states he will do better if mother is in PACU when he is coming out of anes.   Development delay    states mental status of a 34 year old   Exercise-induced asthma    prn inhaler/neb.   Fetal alcohol syndrome    Narcolepsy    Non-restorable tooth 09/2015   teeth   Separation anxiety    Twin birth     His Past Surgical History Is Significant For: Past Surgical History:  Procedure Laterality Date   IR IMAGING GUIDED PORT INSERTION  08/11/2022   MULTIPLE EXTRACTIONS WITH ALVEOLOPLASTY N/A 09/23/2015   Procedure: MULTIPLE EXTRACTION WITH ALVEOLOPLASTY;  Surgeon: Glendia Primrose, DDS;  Location: [facility];  Service: Oral Surgery;  Laterality: N/A;   PYLOROMYOTOMY     age [age], performed found to not have stenosis on surgical exam   PYLOROMYOTOMY     did not have pyloric stenosis; did surgery on the wrong twin   RADIOLOGY WITH ANESTHESIA N/A 01/16/2020   Procedure: MRI WITH ANESTHESIA  BRAIN WITH AND WITHOUT CONTRAST;  Surgeon: Radiologist, Medication, MD;  Location: [facility] OR;  Service: Radiology;  Laterality: N/A;    His Family History Is Significant For: Family History  Adopted: Yes  Problem Relation Age of Onset   Mental illness Mother    Diabetes Mother    Hypertension Mother    Schizophrenia Mother    Bipolar disorder Mother    Mental retardation Father    Sleep apnea Neg Hx     His Social  History Is Significant For: Social History   Socioeconomic History   Marital status: Single    Spouse name: Not on file   Number of children: Not on file   Years of education: Not on file   Highest education level: Not on file  Occupational History   Not on file  Tobacco Use   Smoking status: Never    Smokeless tobacco: Never  Vaping Use   Vaping status: Never Used  Substance and Sexual Activity   Alcohol use: No   Drug use: No   Sexual activity: Not on file  Other Topics Concern   Not on file  Social History Narrative   Pt lives with parents    Pt doesn't work     Caffeine 1 cup      Social Drivers of Health   Tobacco Use: Low Risk (09/27/2024)   Patient History    Smoking Tobacco Use: Never    Smokeless Tobacco Use: Never    Passive Exposure: Not on file  Financial Resource Strain: Not on file  Food Insecurity: Low Risk (06/15/2024)   Received from [facility]   Epic    Within the past 12 months, you worried that your food would run out before you got money to buy more: Never true    Within the past 12 months, the food you bought just didn't last and you didn't have money to get more: Not on file  Transportation Needs: No Transportation Needs (01/05/2024)   PRAPARE - Administrator, Civil Service (Medical): No    Lack of Transportation (Non-Medical): No  Physical Activity: Not on file  Stress: Not on file  Social Connections: Not on file  Depression (EYV7-0): Low Risk (03/15/2024)   Depression (PHQ2-9)    PHQ-2 Score: 0  Alcohol Screen: Not on file  Housing: Low Risk (06/15/2024)   Received from [facility]   Epic    What is your living situation today?: I have a steady place to live    Think about the place you live. Do you have problems with any of the following? Choose all that apply:: None/None on this list  Utilities: Low Risk (06/15/2024)   Received from [facility]   Utilities    In the past 12 months has the electric, gas, oil, or water company threatened to shut off services in your home? : No  Health Literacy: Not on file    His Allergies Are:  Allergies[1]:   His Current Medications Are:  Outpatient Encounter Medications as of 09/27/2024  Medication Sig   albuterol  (PROVENTIL  HFA;VENTOLIN  HFA) 108 (90 BASE) MCG/ACT inhaler Inhale 2  puffs into the lungs every 6 (six) hours as needed for wheezing or shortness of breath.   Elastic Bandages & Supports (WRIST SPLINT/COCK-UP/LEFT L) MISC Patient to wear on left wrist nightly   Elastic Bandages & Supports (WRIST SPLINT/COCK-UP/RIGHT L) MISC Patient to wear nightly on Right wrist   levothyroxine  (SYNTHROID ) 50 MCG tablet Take 1 tablet (50 mcg total) by mouth every morning.   Multiple Vitamin (MULTIVITAMIN WITH MINERALS) TABS tablet Take 1 tablet by mouth daily with breakfast.   sertraline  (ZOLOFT ) 50 MG tablet Take 1 tablet (50 mg total) by mouth every morning after meal.   triamcinolone  cream (KENALOG ) 0.1 % Apply topically twice a day to irritated skin but for no longer than 2 weeks at a time.   Vitamin D , Ergocalciferol , (DRISDOL ) 1.25 MG (50000 UNIT) CAPS capsule Take 50,000 Units by mouth every  Monday.   No facility-administered encounter medications on file as of 09/27/2024.  :  Review of Systems:  Out of a complete 14 point review of systems, all are reviewed and negative with the exception of these symptoms as listed below:       Review of Systems  Objective:  Neurological Exam  Physical Exam Physical Examination:   Vitals:   09/27/24 1229  BP: 117/78  Pulse: 80    General Examination: The patient is a very pleasant 34 y.o. male in no acute distress.  HEENT: Normocephalic, atraumatic, pupils are equal, round and reactive to light, extraocular tracking is mildly impaired, face is symmetric, speech is very scant and slow with mild impediment, stable.  Corrective eyeglasses in place.  Airway examination reveals moderate mouth dryness and otherwise stable findings.  Tongue protrudes centrally and palate elevates symmetrically, no carotid bruits.  Patient has a beard.  Dentures on top and partials on the bottom.   Chest: Clear to auscultation without wheezing, rhonchi or crackles noted.   Heart: S1+S2+0, regular and normal without murmurs, rubs or gallops  noted.    Abdomen: Soft, non-tender and non-distended.   Extremities: There is mild swelling noted in the distal lower extremities bilaterally, appears stable.    Skin: Warm and dry without trophic changes noted.   Musculoskeletal: exam reveals no obvious joint deformities.    Neurologically:  Mental status: The patient is awake, alert and pays attention.  He is able to follow simple commands, he is minimally verbal.  His history is supplemented by his father.      Cranial nerves II - XII are as described above under HEENT exam.  Motor exam: Normal bulk, moving all 4 extremities.  No obvious restriction, no obvious action or resting tremor.   Fine motor skills and coordination: Grossly intact. Cerebellar testing: No dysmetria or intention tremor. There is no truncal or gait ataxia.  Sensory exam: intact to light touch in the upper and lower extremities.  Gait, station and balance: He stands up slowly and walks slowly, able to push his mother's wheelchair.     Assessment and Plan:  In summary, Paul [name] is a 34 year old male with an underlying medical history of Hodgkin's lymphoma, allergies, fetal alcohol syndrome, with developmental delays, ADHD, vitamin D  deficiency, asthma, and obesity, who presents for follow-up consultation of his obstructive sleep apnea, established on CPAP therapy at a pressure of 9 cm with good apnea control.  He has had variable compliance.  He has an increased leak secondary to restlessness most likely but also facial hair and also sleeping without his dentures, which changes the fit of the mask.  He is encouraged to be consistent with his CPAP therapy.  He is usually up-to-date with supplies.  He is advised to follow-up routinely in this clinic to see one of our nurse practitioners in 1 year.  I answered all their questions today and the patient and his mother were in agreement. I spent 30 minutes in total face-to-face time and in reviewing records during  pre-charting, more than 50% of which was spent in counseling and coordination of care, reviewing test results, reviewing medications and treatment regimen and/or in discussing or reviewing the diagnosis of OSA, the prognosis and treatment options. Pertinent laboratory and imaging test results that were available during this visit with the patient were reviewed by me and considered in my medical decision making (see chart for details).      [1]  Allergies Allergen Reactions  Gabapentin      MAKES HIM GO INTO A DEEP SLEEP   Modafinil  Other (See Comments)    aggression   Other Other (See Comments)    Fetal alcohol syndrome  Certain (unnamed) medications have adverse effects on him. Must have heavy anesthesia meds in order to work.

## 2024-09-30 ENCOUNTER — Other Ambulatory Visit: Payer: Self-pay

## 2024-10-02 ENCOUNTER — Encounter (HOSPITAL_COMMUNITY)

## 2024-10-05 ENCOUNTER — Encounter: Payer: Self-pay | Admitting: Hematology

## 2024-10-06 ENCOUNTER — Other Ambulatory Visit: Payer: Self-pay

## 2024-10-13 ENCOUNTER — Inpatient Hospital Stay

## 2024-10-13 ENCOUNTER — Inpatient Hospital Stay: Admitting: Hematology

## 2024-10-16 ENCOUNTER — Ambulatory Visit (HOSPITAL_COMMUNITY): Admission: RE | Admit: 2024-10-16 | Source: Ambulatory Visit

## 2024-11-01 ENCOUNTER — Inpatient Hospital Stay: Admitting: Hematology

## 2024-11-01 ENCOUNTER — Inpatient Hospital Stay: Attending: Hematology
